# Patient Record
Sex: Male | Born: 1965 | Race: White | Hispanic: No | Marital: Single | Smoking: Never smoker
Health system: Southern US, Community
[De-identification: ages and names within clinical notes are randomized; demographics above are authoritative.]

## PROBLEM LIST (undated history)

## (undated) DIAGNOSIS — E785 Hyperlipidemia, unspecified: Secondary | ICD-10-CM

## (undated) DIAGNOSIS — R55 Syncope and collapse: Secondary | ICD-10-CM

## (undated) DIAGNOSIS — R569 Unspecified convulsions: Secondary | ICD-10-CM

## (undated) DIAGNOSIS — M1990 Unspecified osteoarthritis, unspecified site: Secondary | ICD-10-CM

## (undated) DIAGNOSIS — R519 Headache, unspecified: Secondary | ICD-10-CM

## (undated) DIAGNOSIS — Q909 Down syndrome, unspecified: Secondary | ICD-10-CM

## (undated) DIAGNOSIS — D509 Iron deficiency anemia, unspecified: Secondary | ICD-10-CM

## (undated) HISTORY — DX: Unspecified osteoarthritis, unspecified site: M19.90

## (undated) HISTORY — DX: Syncope and collapse: R55

## (undated) HISTORY — DX: Headache, unspecified: R51.9

## (undated) HISTORY — DX: Hyperlipidemia, unspecified: E78.5

## (undated) HISTORY — DX: Iron deficiency anemia, unspecified: D50.9

## (undated) NOTE — *Deleted (*Deleted)
Chaplain engaged in initial visit with Tracy Todd and his sister Tracy Todd.  Tracy Todd explained [name]'s current condition and health journey to chaplain.  Tracy Todd has seen some significant changes in [place] and as a family they have endured a lot of transition and grief over the course of two years.  They lost their j

---

## 2011-10-25 DIAGNOSIS — E785 Hyperlipidemia, unspecified: Secondary | ICD-10-CM | POA: Insufficient documentation

## 2017-04-04 ENCOUNTER — Other Ambulatory Visit: Payer: Self-pay | Admitting: Gastroenterology

## 2017-04-04 DIAGNOSIS — R634 Abnormal weight loss: Secondary | ICD-10-CM

## 2017-04-10 ENCOUNTER — Ambulatory Visit
Admission: RE | Admit: 2017-04-10 | Discharge: 2017-04-10 | Disposition: A | Discharge status: Home / Self Care | Payer: Medicaid Other | Source: Ambulatory Visit | Attending: Gastroenterology | Admitting: Gastroenterology

## 2017-04-10 DIAGNOSIS — R634 Abnormal weight loss: Secondary | ICD-10-CM

## 2019-06-10 ENCOUNTER — Other Ambulatory Visit: Payer: Self-pay

## 2019-06-10 DIAGNOSIS — Z20822 Contact with and (suspected) exposure to covid-19: Secondary | ICD-10-CM

## 2019-06-10 NOTE — Addendum Note (Signed)
Addended by: Jonelle Sidle E on: 06/10/2019 01:21 PM   Modules accepted: Orders

## 2019-09-16 ENCOUNTER — Observation Stay (HOSPITAL_COMMUNITY)
Admission: EM | Admit: 2019-09-16 | Discharge: 2019-09-17 | Disposition: A | Discharge status: Home / Self Care | Payer: Medicare Other | Attending: Student in an Organized Health Care Education/Training Program | Admitting: Student in an Organized Health Care Education/Training Program

## 2019-09-16 ENCOUNTER — Emergency Department (HOSPITAL_COMMUNITY): Payer: Medicare Other

## 2019-09-16 ENCOUNTER — Other Ambulatory Visit: Payer: Self-pay | Admitting: Cardiology

## 2019-09-16 ENCOUNTER — Observation Stay (HOSPITAL_COMMUNITY): Payer: Medicare Other

## 2019-09-16 ENCOUNTER — Other Ambulatory Visit: Payer: Self-pay

## 2019-09-16 DIAGNOSIS — M25569 Pain in unspecified knee: Secondary | ICD-10-CM | POA: Diagnosis not present

## 2019-09-16 DIAGNOSIS — D509 Iron deficiency anemia, unspecified: Secondary | ICD-10-CM | POA: Diagnosis not present

## 2019-09-16 DIAGNOSIS — I517 Cardiomegaly: Secondary | ICD-10-CM | POA: Diagnosis not present

## 2019-09-16 DIAGNOSIS — R9431 Abnormal electrocardiogram [ECG] [EKG]: Secondary | ICD-10-CM | POA: Diagnosis not present

## 2019-09-16 DIAGNOSIS — R7989 Other specified abnormal findings of blood chemistry: Secondary | ICD-10-CM | POA: Diagnosis not present

## 2019-09-16 DIAGNOSIS — E538 Deficiency of other specified B group vitamins: Secondary | ICD-10-CM | POA: Insufficient documentation

## 2019-09-16 DIAGNOSIS — Q909 Down syndrome, unspecified: Secondary | ICD-10-CM

## 2019-09-16 DIAGNOSIS — Z791 Long term (current) use of non-steroidal anti-inflammatories (NSAID): Secondary | ICD-10-CM | POA: Diagnosis not present

## 2019-09-16 DIAGNOSIS — R001 Bradycardia, unspecified: Secondary | ICD-10-CM | POA: Diagnosis not present

## 2019-09-16 DIAGNOSIS — Z7982 Long term (current) use of aspirin: Secondary | ICD-10-CM | POA: Insufficient documentation

## 2019-09-16 DIAGNOSIS — Z880 Allergy status to penicillin: Secondary | ICD-10-CM | POA: Diagnosis not present

## 2019-09-16 DIAGNOSIS — Z79899 Other long term (current) drug therapy: Secondary | ICD-10-CM | POA: Diagnosis not present

## 2019-09-16 DIAGNOSIS — R778 Other specified abnormalities of plasma proteins: Secondary | ICD-10-CM | POA: Insufficient documentation

## 2019-09-16 DIAGNOSIS — Z7901 Long term (current) use of anticoagulants: Secondary | ICD-10-CM | POA: Insufficient documentation

## 2019-09-16 DIAGNOSIS — R55 Syncope and collapse: Principal | ICD-10-CM | POA: Diagnosis present

## 2019-09-16 DIAGNOSIS — E785 Hyperlipidemia, unspecified: Secondary | ICD-10-CM | POA: Insufficient documentation

## 2019-09-16 DIAGNOSIS — R0683 Snoring: Secondary | ICD-10-CM | POA: Insufficient documentation

## 2019-09-16 DIAGNOSIS — M1710 Unilateral primary osteoarthritis, unspecified knee: Secondary | ICD-10-CM | POA: Diagnosis not present

## 2019-09-16 DIAGNOSIS — K219 Gastro-esophageal reflux disease without esophagitis: Secondary | ICD-10-CM | POA: Diagnosis not present

## 2019-09-16 DIAGNOSIS — R159 Full incontinence of feces: Secondary | ICD-10-CM | POA: Insufficient documentation

## 2019-09-16 DIAGNOSIS — I4891 Unspecified atrial fibrillation: Secondary | ICD-10-CM | POA: Diagnosis not present

## 2019-09-16 DIAGNOSIS — Z20822 Contact with and (suspected) exposure to covid-19: Secondary | ICD-10-CM | POA: Insufficient documentation

## 2019-09-16 HISTORY — DX: Down syndrome, unspecified: Q90.9

## 2019-09-16 LAB — CBG MONITORING, ED: Glucose-Capillary: 84 mg/dL (ref 70–99)

## 2019-09-16 LAB — CBC WITH DIFFERENTIAL/PLATELET
Abs Immature Granulocytes: 0.24 10*3/uL — ABNORMAL HIGH (ref 0.00–0.07)
Basophils Absolute: 0.1 10*3/uL (ref 0.0–0.1)
Basophils Relative: 1 %
Eosinophils Absolute: 0 10*3/uL (ref 0.0–0.5)
Eosinophils Relative: 1 %
HCT: 34.8 % — ABNORMAL LOW (ref 39.0–52.0)
Hemoglobin: 10.3 g/dL — ABNORMAL LOW (ref 13.0–17.0)
Immature Granulocytes: 4 %
Lymphocytes Relative: 16 %
Lymphs Abs: 0.9 10*3/uL (ref 0.7–4.0)
MCH: 28.5 pg (ref 26.0–34.0)
MCHC: 29.6 g/dL — ABNORMAL LOW (ref 30.0–36.0)
MCV: 96.4 fL (ref 80.0–100.0)
Monocytes Absolute: 0.3 10*3/uL (ref 0.1–1.0)
Monocytes Relative: 5 %
Neutro Abs: 4.1 10*3/uL (ref 1.7–7.7)
Neutrophils Relative %: 73 %
Platelets: 265 10*3/uL (ref 150–400)
RBC: 3.61 MIL/uL — ABNORMAL LOW (ref 4.22–5.81)
RDW: 14.6 % (ref 11.5–15.5)
WBC: 5.6 10*3/uL (ref 4.0–10.5)
nRBC: 0 % (ref 0.0–0.2)

## 2019-09-16 LAB — TROPONIN I (HIGH SENSITIVITY)
Troponin I (High Sensitivity): 19 ng/L — ABNORMAL HIGH (ref <18)
Troponin I (High Sensitivity): 101 ng/L (ref <18)
Troponin I (High Sensitivity): 154 ng/L (ref <18)
Troponin I (High Sensitivity): 107 ng/L (ref <18)

## 2019-09-16 LAB — COMPREHENSIVE METABOLIC PANEL
ALT: 18 U/L (ref 0–44)
AST: 25 U/L (ref 15–41)
Albumin: 3.3 g/dL — ABNORMAL LOW (ref 3.5–5.0)
Alkaline Phosphatase: 52 U/L (ref 38–126)
Anion gap: 10 (ref 5–15)
BUN: 22 mg/dL — ABNORMAL HIGH (ref 6–20)
CO2: 25 mmol/L (ref 22–32)
Calcium: 8.6 mg/dL — ABNORMAL LOW (ref 8.9–10.3)
Chloride: 106 mmol/L (ref 98–111)
Creatinine, Ser: 1.29 mg/dL — ABNORMAL HIGH (ref 0.61–1.24)
GFR calc Af Amer: >60 mL/min (ref >60)
GFR calc non Af Amer: >60 mL/min (ref >60)
Glucose, Bld: 108 mg/dL — ABNORMAL HIGH (ref 70–99)
Potassium: 4.2 mmol/L (ref 3.5–5.1)
Sodium: 141 mmol/L (ref 135–145)
Total Bilirubin: 0.5 mg/dL (ref 0.3–1.2)
Total Protein: 6.2 g/dL — ABNORMAL LOW (ref 6.5–8.1)

## 2019-09-16 LAB — RESPIRATORY PANEL BY RT PCR (FLU A&B, COVID)
Influenza A by PCR: NEGATIVE
Influenza B by PCR: NEGATIVE
SARS Coronavirus 2 by RT PCR: NEGATIVE

## 2019-09-16 LAB — FERRITIN: Ferritin: 6 ng/mL — ABNORMAL LOW (ref 24–336)

## 2019-09-16 LAB — VITAMIN B12: Vitamin B-12: 140 pg/mL — ABNORMAL LOW (ref 180–914)

## 2019-09-16 LAB — SAVE SMEAR(SSMR), FOR PROVIDER SLIDE REVIEW

## 2019-09-16 LAB — TSH: TSH: 1.941 u[IU]/mL (ref 0.350–4.500)

## 2019-09-16 LAB — D-DIMER, QUANTITATIVE: D-Dimer, Quant: 2.2 ug/mL-FEU — ABNORMAL HIGH (ref 0.00–0.50)

## 2019-09-16 LAB — HIV ANTIBODY (ROUTINE TESTING W REFLEX): HIV Screen 4th Generation wRfx: NONREACTIVE

## 2019-09-16 LAB — MAGNESIUM: Magnesium: 1.9 mg/dL (ref 1.7–2.4)

## 2019-09-16 LAB — TECHNOLOGIST SMEAR REVIEW

## 2019-09-16 MED ORDER — ENOXAPARIN SODIUM 40 MG/0.4ML ~~LOC~~ SOLN
40.0000 mg | SUBCUTANEOUS | Status: DC
  Administered 2019-09-16: 40 mg via SUBCUTANEOUS
  Filled 2019-09-16: qty 0.4

## 2019-09-16 MED ORDER — SODIUM CHLORIDE 0.9% FLUSH: 3.0000 mL | Freq: Two times a day (BID) | INTRAVENOUS | Status: DC

## 2019-09-16 MED ORDER — SODIUM CHLORIDE 0.9 % IV BOLUS
1000.0000 mL | Freq: Once | INTRAVENOUS | Status: AC
  Administered 2019-09-16: 13:00:00 1000 mL via INTRAVENOUS

## 2019-09-16 MED ORDER — ASPIRIN 81 MG PO CHEW
324.0000 mg | CHEWABLE_TABLET | Freq: Once | ORAL | Status: AC
  Administered 2019-09-16: 324 mg via ORAL
  Filled 2019-09-16: qty 4

## 2019-09-16 MED ORDER — ADULT MULTIVITAMIN W/MINERALS CH
1.0000 | ORAL_TABLET | Freq: Every day | ORAL | Status: DC
  Administered 2019-09-17: 1 via ORAL
  Filled 2019-09-16: qty 1

## 2019-09-16 MED ORDER — LORAZEPAM 2 MG/ML IJ SOLN
2.0000 mg | Freq: Once | INTRAMUSCULAR | Status: AC
  Administered 2019-09-16: 2 mg via INTRAVENOUS

## 2019-09-16 MED ORDER — SODIUM CHLORIDE 0.9 % IV BOLUS
1000.0000 mL | Freq: Once | INTRAVENOUS | Status: AC
  Administered 2019-09-16: 1000 mL via INTRAVENOUS

## 2019-09-16 MED ORDER — SUCRALFATE 1 G PO TABS
1.0000 g | ORAL_TABLET | Freq: Two times a day (BID) | ORAL | Status: DC
  Administered 2019-09-16 – 2019-09-17 (×2): 1 g via ORAL
  Filled 2019-09-16 (×2): qty 1

## 2019-09-16 MED ORDER — LORAZEPAM 2 MG/ML IJ SOLN
2.0000 mg | Freq: Once | INTRAMUSCULAR | Status: AC | PRN
  Administered 2019-09-16: 1 mg via INTRAVENOUS
  Filled 2019-09-16: qty 1

## 2019-09-16 MED ORDER — FIBER ADULT GUMMIES 2 G PO CHEW: CHEWABLE_TABLET | Freq: Every day | ORAL | Status: DC

## 2019-09-16 MED ORDER — LACTATED RINGERS IV SOLN: INTRAVENOUS | Status: DC

## 2019-09-16 MED ORDER — PRAVASTATIN SODIUM 40 MG PO TABS
40.0000 mg | ORAL_TABLET | Freq: Every day | ORAL | Status: DC
  Administered 2019-09-17: 40 mg via ORAL
  Filled 2019-09-16: qty 1

## 2019-09-16 MED ORDER — IOHEXOL 350 MG/ML SOLN
100.0000 mL | Freq: Once | INTRAVENOUS | Status: AC | PRN
  Administered 2019-09-16: 100 mL via INTRAVENOUS

## 2019-09-16 MED ORDER — DICLOFENAC SODIUM 75 MG PO TBEC: 75.0000 mg | DELAYED_RELEASE_TABLET | Freq: Two times a day (BID) | ORAL | Status: DC

## 2019-09-16 MED ORDER — LOPERAMIDE HCL 2 MG PO CAPS
2.0000 mg | ORAL_CAPSULE | Freq: Two times a day (BID) | ORAL | Status: DC
  Administered 2019-09-16 – 2019-09-17 (×2): 2 mg via ORAL
  Filled 2019-09-16 (×2): qty 1

## 2019-09-16 MED ORDER — LORAZEPAM 2 MG/ML IJ SOLN
INTRAMUSCULAR | Status: AC
  Filled 2019-09-16: qty 1

## 2019-09-16 NOTE — Progress Notes (Signed)
Patient admitted to [facility] room 10 with near syncope. Patient is alert to self, has developmental delay. Father is at beside. No C/o pain or respiratory distress at this time.patient is in bed locked at lower level. Will continue to monitor patient.

## 2019-09-16 NOTE — ED Notes (Signed)
[phone]) left for today. Want RN to have number to call for updates.

## 2019-09-16 NOTE — H&P (Signed)
Date: 09/16/2019               Patient Name:  Tracy Todd MRN: [id number]  DOB: April 11, 1966 Age / Sex: 54 y.o., male   PCP: Chesley Noon, MD         Medical Service: Internal Medicine Teaching Service         Attending Physician: Dr. Evette Doffing, Mallie Mussel, *    First Contact: Dr. Ronnald Ramp Pager: [phone]  Second Contact: Dr. Koleen Distance Pager: [phone]       After Hours (After 5p/  First Contact Pager: [phone]  weekends / holidays): Second Contact Pager: [phone]   Chief Complaint: Syncopal event   History of Present Illness: Tracy Todd is a 54 year-old male with a medical history of Down's syndrome, arthritis, GERD, and hyperlipidemia, who presents to the ED for evaluation of a syncopal event. Patient unable to provide history at this time as he is minimally verbal. Group home manager present at bedside and able to provide some history. This manager was not present for the event, but received report on it before presentation to the hospital.  Patient was in his usual state of health until this morning when he got up from the toilet and passed out. He briefly unconscious, but began to stir and open his eyes approximately 30 seconds to 1 minute later. For an additional 10 minutes, staff noticed he was breathing heavily and snoring initially before his breathing then normalized. Staff reports that the pt does not communicate when he is in pain or experiences symptoms. They say he is a healthy adult and only gets medical attention once a year at his physicals.  He has never had a syncopal episode in the past. No history of cardiac problems or cardiac surgeries. He has been isolated since March with no exposures or sick contacts. No history of seizures or recent changes in medications. He has been having appropriate oral intake. Has a history of fecal incontinence but takes loperamide which helps with symptoms and only has 2-3 BMs per day.   In the ED, pt was afebrile, bradycardic (HR in  the 50's), with initial BP 103/48. Initial troponin trend was 19 >> 100 and cardiology was consulted. Dr. Marlou Porch evaluated the pt and felt his clinical picture and lab work were indicative of hypotension during an orthostatic episode. He recommended outpatient echo and cardiology follow-up. After 3rd troponin in the ED trended up to 150, IMTS was called for admission for echo and observation.  Meds:  Current Meds  Medication Sig  . diclofenac (VOLTAREN) 75 MG EC tablet Take 75 mg by mouth 2 (two) times daily.  Marland Kitchen FIBER ADULT GUMMIES PO Take 2 tablets by mouth daily.  Marland Kitchen ketoconazole (NIZORAL) 2 % shampoo Apply 1 application topically 2 (two) times a week.  . loperamide (IMODIUM) 2 MG capsule Take 2 mg by mouth 2 (two) times daily.  . miconazole (MICATIN) 2 % cream Apply 1 application topically 2 (two) times daily. Apply to feet  . Multiple Vitamins-Minerals (MULTIVITAMIN WITH MINERALS) tablet Take 1 tablet by mouth daily.  Marland Kitchen nystatin cream (MYCOSTATIN) Apply 1 application topically 3 (three) times daily as needed for dry skin.  . pravastatin (PRAVACHOL) 40 MG tablet Take 40 mg by mouth daily.  . Selenium Sulfide 2.3 % SHAM Apply 1 application topically See admin instructions. Wash hair and face, alternate weekly with Nizoral shampoo  . sucralfate (CARAFATE) 1 g tablet Take 1 g by mouth 2 (two) times  daily.   Allergies: Allergies as of 09/16/2019 - Review Complete 09/16/2019  Allergen Reaction Noted  . Penicillins  10/21/2010   No past medical history on file.  Family History:  No family history on file. Unable to obtain given pt's baseline functional status.   Social History:  Pt lives at a [facility], a group home for adult males with intellectual and developmental disabilities. Per report from group home, pt is pleasant and personable though does not converse much. Social History   Tobacco Use  . Smoking status: Not on file  Substance Use Topics  . Alcohol use: Not on file  . Drug use:  Not on file   Review of Systems: Unable to obtain given pt's baseline functional status.   Physical Exam: Blood pressure (!) 130/49, pulse (!) 54, temperature 98 F (36.7 C), temperature source Oral, resp. rate 16, SpO2 93 %. Physical Exam Vitals and nursing note reviewed.  Constitutional:      General: He is not in acute distress.    Appearance: He is not ill-appearing.  HENT:     Head: Normocephalic and atraumatic.     Comments: Phenotypically Down's faces. Eyes:     Extraocular Movements: Extraocular movements intact.  Cardiovascular:     Rate and Rhythm: Normal rate and regular rhythm.     Heart sounds: Normal heart sounds.  Pulmonary:     Effort: Pulmonary effort is normal. No respiratory distress.     Breath sounds: Normal breath sounds.  Abdominal:     General: Abdomen is flat. Bowel sounds are normal.     Palpations: Abdomen is soft.  Musculoskeletal:     Comments: Discoloration over knees bilaterally. Without swelling or deformity.  Skin:    General: Skin is warm and dry.  Neurological:     General: No focal deficit present.     Mental Status: He is alert.     Comments: Moving all extremities spontaneously   Psychiatric:     Comments: Pleasant affect    EKG: personally reviewed my interpretation is normal rate, sinus rhythm, and biphasic T waves in V2-V5.  CXR: personally reviewed my interpretation is no cardiomegaly, no pulmonary infiltrates, effusion, or bony abnormalities.  Assessment & Plan by Problem: Active Problems:   Near syncope  Tracy Todd is a 54 year-old male with a medical history of Down's syndrome, arthritis, GERD, and hyperlipidemia, who presents to the ED for evaluation of a syncopal event.   Near syncope Pt presented after episode of pre-syncope after using the restroom at his group home. Per report, it sounds like pt was alert after event but non-verbal (which is not off his baseline). Pt could not endorse dyspnea, chest pain, or  prodromal symptoms. No head trauma reported, and imaging negative. No history of heart surgeries or VSDs, no murmur on exam.  In the ED, he had soft blood pressures with an initial MAP 63, but sequent MAPs after fluids were greater than 77. Orthostatic vital signs were negative. D-dimer elevated to 2.20, though CTA negative for PE. History not consistent with seizure, given no post ictal period or observed myoclonic jerks. Trop trend 19 >> 100 >> 150. EKG remarkable for biphasic T waves in leads V2-V5 which were stable on repeat. Cardiology consulted in the ED, felt pt's presentation consistent with orthostatic hypotension in the setting of dehydration - outpt follow-up scheduled for 09/30/2019.  - presentation most consistent with vasovagal vs orthostatic syncope - will observe overnight  - echo - repeat evening troponin -  repeat EKGs every 6hrs overnight to assess for ischemic changes - continue maintenance fluids - follow-up AM BMP  Anemia Hgb on admission 10.3. Chronicity uncertain given last Hgb in chart was 13.8 in 2018. Acute drop could account for his pre-syncopal episode. No documented melena or hematochezia. No prior colonoscopy documented in the chart. Group home manager can't recall pt have any endoscopic procedures, as he does not believe the pt could tolerate that.  - FOBT - ferritin, B12, and path smear - continue to monitor daily CBCs  Hyperlipidemia - continue home pravastatin 40mg  daily  GERD - continue home sucralfate 1g PO BID  Diet - regular Fluids - LR 167mL/hr DVT ppx - enoxaparin 40mg  subQ daily CODE STATUS - FULL CODE  Dispo: Admit patient to Observation with expected length of stay less than 2 midnights.  Signed: 80m, MD 09/16/2019, 6:00 PM  Pager: [phone]

## 2019-09-16 NOTE — ED Triage Notes (Signed)
Pt arrives by EMS from group home with c/o witnessed syncope. Pt reportedly was in the restroom, stood up and had a witnessed syncopal episode. Pt unresponsive for about 10 mis. Pt has down syndrome at baseline. Pt during triage refusing treatment

## 2019-09-16 NOTE — ED Provider Notes (Signed)
[facility] Provider Note   CSN: [id number] Arrival date & time: 09/16/19  [id number]  LEVEL 5 CAVEAT - NONVERBAL History Chief Complaint  Patient presents with  . Loss of Consciousness    Tracy Todd is a 54 y.o. male.  HPI 54 year old male with a history of Down syndrome presents with syncope.  History is very limited.  The patient cannot provide any history.  EMS reports they were called out to the group home for syncope.  Patient reportedly stood up from going to the bathroom and was pulling up his pants and passed out falling forward.  EMS reports that he was unconscious when they arrived and slowly improved over about 10 minutes.  They saw some dried blood in his mouth but could not get a good look.  No obvious seizure like activity. Otherwise, history is very limited.  No past medical history on file.  There are no problems to display for this patient.        No family history on file.  Social History   Tobacco Use  . Smoking status: Not on file  Substance Use Topics  . Alcohol use: Not on file  . Drug use: Not on file    Home Medications Prior to Admission medications   Medication Sig Start Date End Date Taking? Authorizing Provider  diclofenac (VOLTAREN) 75 MG EC tablet Take 75 mg by mouth 2 (two) times daily. 10/25/11  Yes [provider]  FIBER ADULT GUMMIES PO Take 2 tablets by mouth daily.   Yes [provider]  ketoconazole (NIZORAL) 2 % shampoo Apply 1 application topically 2 (two) times a week. 08/19/19  Yes [provider]  loperamide (IMODIUM) 2 MG capsule Take 2 mg by mouth 2 (two) times daily.   Yes [provider]  miconazole (MICATIN) 2 % cream Apply 1 application topically 2 (two) times daily. Apply to feet   Yes [provider]  Multiple Vitamins-Minerals (MULTIVITAMIN WITH MINERALS) tablet Take 1 tablet by mouth daily.   Yes [provider]  nystatin cream  (MYCOSTATIN) Apply 1 application topically 3 (three) times daily as needed for dry skin. 01/21/19  Yes [provider]  pravastatin (PRAVACHOL) 40 MG tablet Take 40 mg by mouth daily. 10/25/11  Yes [provider]  Selenium Sulfide 2.3 % SHAM Apply 1 application topically See admin instructions. Wash hair and face, alternate weekly with Nizoral shampoo 12/09/11  Yes [provider]  sucralfate (CARAFATE) 1 g tablet Take 1 g by mouth 2 (two) times daily. 01/03/19  Yes [provider]    Allergies    Penicillins  Review of Systems   Review of Systems  Unable to perform ROS: Mental status change    Physical Exam Updated Vital Signs BP 103/89   Pulse (!) 56 Comment: Room air  Temp 98.2 F (36.8 C) (Rectal)   Resp 15   SpO2 98% Comment: Room air  Physical Exam Vitals and nursing note reviewed.  Constitutional:      Appearance: He is well-developed.     Interventions: Cervical collar in place.  HENT:     Head: Normocephalic and atraumatic.     Right Ear: External ear normal.     Left Ear: External ear normal.     Nose: Nose normal.  Eyes:     General:        Right eye: No discharge.        Left eye: No discharge.  Pupils: Pupils are equal, round, and reactive to light.  Cardiovascular:     Rate and Rhythm: Normal rate and regular rhythm.     Heart sounds: Normal heart sounds. No murmur.  Pulmonary:     Effort: Pulmonary effort is normal.     Breath sounds: Normal breath sounds.  Abdominal:     Palpations: Abdomen is soft.     Tenderness: There is no abdominal tenderness.  Musculoskeletal:     Cervical back: Neck supple.  Skin:    General: Skin is warm and dry.  Neurological:     Mental Status: He is alert.     Comments: Awake, alert, does not speak. Moves all 4 extremities without obvious deficits. Does not really participate in the exam.  Psychiatric:        Mood and Affect: Mood is not anxious.     ED Results / Procedures /  Treatments   Labs (all labs ordered are listed, but only abnormal results are displayed) Labs Reviewed  CBC WITH DIFFERENTIAL/PLATELET - Abnormal; Notable for the following components:      Result Value   RBC 3.61 (*)    Hemoglobin 10.3 (*)    HCT 34.8 (*)    MCHC 29.6 (*)    Abs Immature Granulocytes 0.24 (*)    All other components within normal limits  COMPREHENSIVE METABOLIC PANEL - Abnormal; Notable for the following components:   Glucose, Bld 108 (*)    BUN 22 (*)    Creatinine, Ser 1.29 (*)    Calcium 8.6 (*)    Total Protein 6.2 (*)    Albumin 3.3 (*)    All other components within normal limits  D-DIMER, QUANTITATIVE (NOT AT [facility]) - Abnormal; Notable for the following components:   D-Dimer, Quant 2.20 (*)    All other components within normal limits  TROPONIN I (HIGH SENSITIVITY) - Abnormal; Notable for the following components:   Troponin I (High Sensitivity) 19 (*)    All other components within normal limits  TROPONIN I (HIGH SENSITIVITY) - Abnormal; Notable for the following components:   Troponin I (High Sensitivity) 101 (*)    All other components within normal limits  TROPONIN I (HIGH SENSITIVITY) - Abnormal; Notable for the following components:   Troponin I (High Sensitivity) 154 (*)    All other components within normal limits  RESPIRATORY PANEL BY RT PCR (FLU A&B, COVID)  MAGNESIUM  URINALYSIS, ROUTINE W REFLEX MICROSCOPIC  RAPID URINE DRUG SCREEN, HOSP PERFORMED  CBG MONITORING, ED    EKG EKG Interpretation  Date/Time:  Monday September 16 2019 14:18:54 EST Ventricular Rate:  52 PR Interval:    QRS Duration: 99 QT Interval:  420 QTC Calculation: 391 R Axis:   81 Text Interpretation: Sinus rhythm Borderline short PR interval Abnormal R-wave progression, early transition Borderline T abnormalities, inferior leads no significant change since earlier in the day Confirmed by [name] [phone]) on 09/16/2019 2:37:17 PM   Radiology CT HEAD WO  CONTRAST  Result Date: 09/16/2019 CLINICAL DATA:  Syncopal episode. Patient was unresponsive for 10 minutes after syncopal episode. Syncope upon standing up after using the restroom. Initial encounter. EXAM: CT HEAD WITHOUT CONTRAST CT CERVICAL SPINE WITHOUT CONTRAST TECHNIQUE: Multidetector CT imaging of the head and cervical spine was performed following the standard protocol without intravenous contrast. Multiplanar CT image reconstructions of the cervical spine were also generated. COMPARISON:  None. FINDINGS: CT HEAD FINDINGS Brain: Mild generalized atrophy is present. Ventricular enlargement is proportionate to  the degree of atrophy. The aqueduct is patent. No acute infarct, hemorrhage, or mass lesion is present. No significant extraaxial fluid collection is present. The brainstem and cerebellum are within normal limits. Vascular: Atherosclerotic calcifications are present within the cavernous internal carotid arteries bilaterally. Hyperdense vessel is present. Skull: Calvarium is intact. No focal lytic or blastic lesions are present. No significant extracranial soft tissue lesion is present. Sinuses/Orbits: The paranasal sinuses and mastoid air cells are clear. The globes and orbits are within normal limits. CT CERVICAL SPINE FINDINGS Alignment: Mild subluxation at C1-2 is consistent with down syndrome. The plan toe dens interval is 4.5 mm. Skull base and vertebrae: Craniocervical junction is otherwise within normal limits. Vertebral body heights maintained. No acute or healing fractures are present. Soft tissues and spinal canal: No prevertebral fluid or swelling. No visible canal hematoma. Disc levels: Advanced endplate degenerative changes present from C2-3 through C6-7. Osseous foraminal narrowing is greatest at C3-4 on the right C4-5 on the left. Upper chest: Lung apices are clear. Thoracic inlet is within normal limits. IMPRESSION: 1. No acute trauma to the head or cervical spine. 2. Mild subluxation  at C1-2 is consistent with down syndrome. 3. Multilevel degenerative changes of the cervical spine without acute or healing fracture. Electronically Signed   By: Marin Roberts M.D.   On: 09/16/2019 08:53   CT Angio Chest PE W and/or Wo Contrast  Result Date: 09/16/2019 CLINICAL DATA:  Syncopal episode today. Elevated D-dimer. EXAM: CT ANGIOGRAPHY CHEST WITH CONTRAST TECHNIQUE: Multidetector CT imaging of the chest was performed using the standard protocol during bolus administration of intravenous contrast. Multiplanar CT image reconstructions and MIPs were obtained to evaluate the vascular anatomy. CONTRAST:  OMNIPAQUE IOHEXOL 350 MG/ML SOLN COMPARISON:  Chest x-ray dated 09/16/2019 FINDINGS: Cardiovascular: Satisfactory opacification of the pulmonary arteries to the segmental level. No evidence of pulmonary embolism. Normal heart size. No pericardial effusion. Mediastinum/Nodes: No enlarged mediastinal, hilar, or axillary lymph nodes. Thyroid gland, trachea, and esophagus demonstrate no significant findings. Lungs/Pleura: Lungs are clear. No pleural effusion or pneumothorax. Upper Abdomen: No acute abnormality. Musculoskeletal: No chest wall abnormality. No acute or significant osseous findings. Review of the MIP images confirms the above findings. IMPRESSION: Normal exam. Electronically Signed   By: Francene Boyers M.D.   On: 09/16/2019 13:31   CT CERVICAL SPINE WO CONTRAST  Result Date: 09/16/2019 CLINICAL DATA:  Syncopal episode. Patient was unresponsive for 10 minutes after syncopal episode. Syncope upon standing up after using the restroom. Initial encounter. EXAM: CT HEAD WITHOUT CONTRAST CT CERVICAL SPINE WITHOUT CONTRAST TECHNIQUE: Multidetector CT imaging of the head and cervical spine was performed following the standard protocol without intravenous contrast. Multiplanar CT image reconstructions of the cervical spine were also generated. COMPARISON:  None. FINDINGS: CT HEAD FINDINGS  Brain: Mild generalized atrophy is present. Ventricular enlargement is proportionate to the degree of atrophy. The aqueduct is patent. No acute infarct, hemorrhage, or mass lesion is present. No significant extraaxial fluid collection is present. The brainstem and cerebellum are within normal limits. Vascular: Atherosclerotic calcifications are present within the cavernous internal carotid arteries bilaterally. Hyperdense vessel is present. Skull: Calvarium is intact. No focal lytic or blastic lesions are present. No significant extracranial soft tissue lesion is present. Sinuses/Orbits: The paranasal sinuses and mastoid air cells are clear. The globes and orbits are within normal limits. CT CERVICAL SPINE FINDINGS Alignment: Mild subluxation at C1-2 is consistent with down syndrome. The plan toe dens interval is 4.5 mm. Skull base  and vertebrae: Craniocervical junction is otherwise within normal limits. Vertebral body heights maintained. No acute or healing fractures are present. Soft tissues and spinal canal: No prevertebral fluid or swelling. No visible canal hematoma. Disc levels: Advanced endplate degenerative changes present from C2-3 through C6-7. Osseous foraminal narrowing is greatest at C3-4 on the right C4-5 on the left. Upper chest: Lung apices are clear. Thoracic inlet is within normal limits. IMPRESSION: 1. No acute trauma to the head or cervical spine. 2. Mild subluxation at C1-2 is consistent with down syndrome. 3. Multilevel degenerative changes of the cervical spine without acute or healing fracture. Electronically Signed   By: San Morelle M.D.   On: 09/16/2019 08:53   DG Chest Portable 1 View  Result Date: 09/16/2019 CLINICAL DATA:  Syncopal episode EXAM: PORTABLE CHEST 1 VIEW COMPARISON:  Noted FINDINGS: The heart size and mediastinal contours are within normal limits. Both lungs are clear. No pleural effusion. The visualized skeletal structures are unremarkable. IMPRESSION: No acute  process in the chest Electronically Signed   By: Macy Mis M.D.   On: 09/16/2019 10:25    Procedures .Critical Care Performed by: Sherwood Gambler, MD Authorized by: Sherwood Gambler, MD   Critical care provider statement:    Critical care time (minutes):  30   Critical care time was exclusive of:  Separately billable procedures and treating other patients   Critical care was necessary to treat or prevent imminent or life-threatening deterioration of the following conditions:  Cardiac failure and circulatory failure   Critical care was time spent personally by me on the following activities:  Discussions with consultants, evaluation of patient's response to treatment, examination of patient, ordering and performing treatments and interventions, ordering and review of laboratory studies, ordering and review of radiographic studies, pulse oximetry, re-evaluation of patient's condition, obtaining history from patient or surrogate and review of old charts   (including critical care time)  Medications Ordered in ED Medications  sodium chloride 0.9 % bolus 1,000 mL (0 mLs Intravenous Stopped 09/16/19 1112)  LORazepam (ATIVAN) injection 2 mg (2 mg Intravenous Given 09/16/19 0825)  sodium chloride 0.9 % bolus 1,000 mL (0 mLs Intravenous Stopped 09/16/19 1357)  aspirin chewable tablet 324 mg (324 mg Oral Given 09/16/19 1320)  LORazepam (ATIVAN) injection 2 mg (1 mg Intravenous Given 09/16/19 1330)  iohexol (OMNIPAQUE) 350 MG/ML injection 100 mL (100 mLs Intravenous Contrast Given 09/16/19 1325)    ED Course  I have reviewed the triage vital signs and the nursing notes.  Pertinent labs & imaging results that were available during my care of the patient were reviewed by me and considered in my medical decision making (see chart for details).    MDM Rules/Calculators/A&P                      Patient placed on cardiac monitoring to eval for arrythmia in setting of syncope. ECG with nonspecific T  waves diffusely. Had abnormal T waves on further review in 2008.   Patient continues to have no complaints here.  Has borderline soft blood pressures though no outright hypotension.  Work-up for PE which is negative.  Initial troponin is 19 and repeat is 100.  I have discussed with Dr. Marlou Porch, who feels this is all related to what was likely hypotension while being unconscious.  With shared decision making, father and I decided to get a third troponin to see if it is continuing to climb.  Given that it is even higher  at 150, I think the most reasonable course is to admit for observation and echo and further care/monitoring.  Dad agrees.  At this point cardiology recommends no heparin.  Given fairly moderate elevations this is probably not ischemic. Final Clinical Impression(s) / ED Diagnoses Final diagnoses:  Syncope and collapse  Elevated troponin    Rx / DC Orders ED Discharge Orders    None       [name], MD 09/16/19 1537

## 2019-09-16 NOTE — Consult Note (Signed)
Cardiology Consultation:   Patient ID: Tracy Todd MRN: [id number]; DOB: Jan 07, 1966  Admit date: 09/16/2019 Date of Consult: 09/16/2019  Primary Care Provider: Eartha Inch, MD Primary Cardiologist: No primary care provider on file. Adelle Zachar new Primary Electrophysiologist:  None    Patient Profile:   Tracy Todd is a 54 y.o. male with a hx of Downs syndrome who is being seen today for the evaluation of syncope at the request of Dr. Criss Alvine.  History of Present Illness:   Tracy Todd is a 54 year old male with Down syndrome here with his father, 59 year old patient of Dr. Lindaann Slough with A. fib, who earlier this morning at syncope.  Stood up from the toilet and passed out.  He was unresponsive for approximately 10 minutes.  His father states that "he has never been sick a day in his life ".  Never had any prior heart surgeries.  No prior arrhythmias.  He denies any chest discomfort or shortness of breath.  Appears fairly comfortable laying flat in bed.  Challenging to obtain a history although does not appear to be in any distress.  His father helps with feeling in the details.  He lives in a group home.  Telemetry here in the emergency department unremarkable.  Sinus rhythm.  Blood pressure has fluctuated here in the ER but at one point was 103/48.  Heart rate in the upper 40s to 50s.  His EKG prompted the call to cardiology given his somewhat biphasic T waves out in the anterior precordial leads.  This was repeated and has no significant change.  His creatinine was slightly elevated 1.29 and his BUN was also slightly elevated at 22.  Minor anemia 10.3 hemoglobin.  White count is normal.  Chest x-ray also unremarkable showing no evidence of cardiomegaly.  Personally reviewed.   Heart Pathway Score:     No past medical history on file.  No past medical history. No early family history of CAD.  His father in his 38s has atrial fibrillation, taken care of by Dr.  Delton See. Non-smoker nondrinker  Home Medications:  Prior to Admission medications   Not on File    Inpatient Medications: Scheduled Meds: . LORazepam       Continuous Infusions: . sodium chloride     PRN Meds:   Allergies:   Not on File  Social History:   Social History   Socioeconomic History  . Marital status: Single    Spouse name: Not on file  . Number of children: Not on file  . Years of education: Not on file  . Highest education level: Not on file  Occupational History  . Not on file  Tobacco Use  . Smoking status: Not on file  Substance and Sexual Activity  . Alcohol use: Not on file  . Drug use: Not on file  . Sexual activity: Not on file  Other Topics Concern  . Not on file  Social History Narrative  . Not on file   Social Determinants of Health   Financial Resource Strain:   . Difficulty of Paying Living Expenses: Not on file  Food Insecurity:   . Worried About Programme researcher, broadcasting/film/video in the Last Year: Not on file  . Ran Out of Food in the Last Year: Not on file  Transportation Needs:   . Lack of Transportation (Medical): Not on file  . Lack of Transportation (Non-Medical): Not on file  Physical Activity:   . Days of Exercise per Week: Not on file  .  Minutes of Exercise per Session: Not on file  Stress:   . Feeling of Stress : Not on file  Social Connections:   . Frequency of Communication with Friends and Family: Not on file  . Frequency of Social Gatherings with Friends and Family: Not on file  . Attends Religious Services: Not on file  . Active Member of Clubs or Organizations: Not on file  . Attends Banker Meetings: Not on file  . Marital Status: Not on file  Intimate Partner Violence:   . Fear of Current or Ex-Partner: Not on file  . Emotionally Abused: Not on file  . Physically Abused: Not on file  . Sexually Abused: Not on file    Family History:   No early family history of CAD  ROS:  Please see the history of  present illness.  Challenging to fully obtain, with the help of father no real inciting factors. All other ROS reviewed and negative.     Physical Exam/Data:   Vitals:   09/16/19 1030 09/16/19 1045 09/16/19 1100 09/16/19 1130  BP: 115/67 (!) 134/116 (!) 126/97 126/76  Pulse:      Resp:    16  Temp:      TempSrc:      SpO2:       No intake or output data in the 24 hours ending 09/16/19 1154 No flowsheet data found.   There is no height or weight on file to calculate BMI.  General:  Well nourished, well developed, in no acute distress, patient with Down's syndrome HEENT: normal Lymph: no adenopathy Neck: no JVD Endocrine:  No thryomegaly Vascular: No carotid bruits; FA pulses 2+ bilaterally without bruits  Cardiac:  normal S1, S2; RRR; no murmur  Lungs:  clear to auscultation bilaterally, no wheezing, rhonchi or rales  Abd: soft, nontender, no hepatomegaly  Ext: no edema Musculoskeletal:  No deformities, BUE and BLE strength normal and equal Skin: warm and dry  Neuro:  CNs 2-12 intact, no focal abnormalities noted Psych:  Normal affect   EKG:  The EKG was personally reviewed and demonstrates: As described above Telemetry:  Telemetry was personally reviewed and demonstrates: Sinus rhythm no pauses no ventricular arrhythmias  Relevant CV Studies: No prior  Laboratory Data:  High Sensitivity Troponin:   Recent Labs  Lab 09/16/19 0816  TROPONINIHS 19*     Chemistry Recent Labs  Lab 09/16/19 0816  NA 141  K 4.2  CL 106  CO2 25  GLUCOSE 108*  BUN 22*  CREATININE 1.29*  CALCIUM 8.6*  GFRNONAA >60  GFRAA >60  ANIONGAP 10    Recent Labs  Lab 09/16/19 0816  PROT 6.2*  ALBUMIN 3.3*  AST 25  ALT 18  ALKPHOS 52  BILITOT 0.5   Hematology Recent Labs  Lab 09/16/19 0816  WBC 5.6  RBC 3.61*  HGB 10.3*  HCT 34.8*  MCV 96.4  MCH 28.5  MCHC 29.6*  RDW 14.6  PLT 265   BNPNo results for input(s): BNP, PROBNP in the last 168 hours.  DDimer No results for  input(s): DDIMER in the last 168 hours.   Radiology/Studies:  CT HEAD WO CONTRAST  Result Date: 09/16/2019 CLINICAL DATA:  Syncopal episode. Patient was unresponsive for 10 minutes after syncopal episode. Syncope upon standing up after using the restroom. Initial encounter. EXAM: CT HEAD WITHOUT CONTRAST CT CERVICAL SPINE WITHOUT CONTRAST TECHNIQUE: Multidetector CT imaging of the head and cervical spine was performed following the standard protocol without intravenous  contrast. Multiplanar CT image reconstructions of the cervical spine were also generated. COMPARISON:  None. FINDINGS: CT HEAD FINDINGS Brain: Mild generalized atrophy is present. Ventricular enlargement is proportionate to the degree of atrophy. The aqueduct is patent. No acute infarct, hemorrhage, or mass lesion is present. No significant extraaxial fluid collection is present. The brainstem and cerebellum are within normal limits. Vascular: Atherosclerotic calcifications are present within the cavernous internal carotid arteries bilaterally. Hyperdense vessel is present. Skull: Calvarium is intact. No focal lytic or blastic lesions are present. No significant extracranial soft tissue lesion is present. Sinuses/Orbits: The paranasal sinuses and mastoid air cells are clear. The globes and orbits are within normal limits. CT CERVICAL SPINE FINDINGS Alignment: Mild subluxation at C1-2 is consistent with down syndrome. The plan toe dens interval is 4.5 mm. Skull base and vertebrae: Craniocervical junction is otherwise within normal limits. Vertebral body heights maintained. No acute or healing fractures are present. Soft tissues and spinal canal: No prevertebral fluid or swelling. No visible canal hematoma. Disc levels: Advanced endplate degenerative changes present from C2-3 through C6-7. Osseous foraminal narrowing is greatest at C3-4 on the right C4-5 on the left. Upper chest: Lung apices are clear. Thoracic inlet is within normal limits.  IMPRESSION: 1. No acute trauma to the head or cervical spine. 2. Mild subluxation at C1-2 is consistent with down syndrome. 3. Multilevel degenerative changes of the cervical spine without acute or healing fracture. Electronically Signed   By: San Morelle M.D.   On: 09/16/2019 08:53   CT CERVICAL SPINE WO CONTRAST  Result Date: 09/16/2019 CLINICAL DATA:  Syncopal episode. Patient was unresponsive for 10 minutes after syncopal episode. Syncope upon standing up after using the restroom. Initial encounter. EXAM: CT HEAD WITHOUT CONTRAST CT CERVICAL SPINE WITHOUT CONTRAST TECHNIQUE: Multidetector CT imaging of the head and cervical spine was performed following the standard protocol without intravenous contrast. Multiplanar CT image reconstructions of the cervical spine were also generated. COMPARISON:  None. FINDINGS: CT HEAD FINDINGS Brain: Mild generalized atrophy is present. Ventricular enlargement is proportionate to the degree of atrophy. The aqueduct is patent. No acute infarct, hemorrhage, or mass lesion is present. No significant extraaxial fluid collection is present. The brainstem and cerebellum are within normal limits. Vascular: Atherosclerotic calcifications are present within the cavernous internal carotid arteries bilaterally. Hyperdense vessel is present. Skull: Calvarium is intact. No focal lytic or blastic lesions are present. No significant extracranial soft tissue lesion is present. Sinuses/Orbits: The paranasal sinuses and mastoid air cells are clear. The globes and orbits are within normal limits. CT CERVICAL SPINE FINDINGS Alignment: Mild subluxation at C1-2 is consistent with down syndrome. The plan toe dens interval is 4.5 mm. Skull base and vertebrae: Craniocervical junction is otherwise within normal limits. Vertebral body heights maintained. No acute or healing fractures are present. Soft tissues and spinal canal: No prevertebral fluid or swelling. No visible canal hematoma.  Disc levels: Advanced endplate degenerative changes present from C2-3 through C6-7. Osseous foraminal narrowing is greatest at C3-4 on the right C4-5 on the left. Upper chest: Lung apices are clear. Thoracic inlet is within normal limits. IMPRESSION: 1. No acute trauma to the head or cervical spine. 2. Mild subluxation at C1-2 is consistent with down syndrome. 3. Multilevel degenerative changes of the cervical spine without acute or healing fracture. Electronically Signed   By: San Morelle M.D.   On: 09/16/2019 08:53   DG Chest Portable 1 View  Result Date: 09/16/2019 CLINICAL DATA:  Syncopal episode EXAM:  PORTABLE CHEST 1 VIEW COMPARISON:  Noted FINDINGS: The heart size and mediastinal contours are within normal limits. Both lungs are clear. No pleural effusion. The visualized skeletal structures are unremarkable. IMPRESSION: No acute process in the chest Electronically Signed   By: Guadlupe Spanish M.D.   On: 09/16/2019 10:25         Assessment and Plan:   Syncope -Given his mildly elevated creatinine, low normal blood pressure at times in the emergency room, getting up from the toilet, this was likely an incident of orthostatic hypotension in the setting of dehydration.  He is feeling better after IV fluid administration.  Blood pressure has improved.  Currently we are obtaining orthostatics. -We will go ahead and set him up as an outpatient to check an echocardiogram to ensure proper structure and function of his heart.  Certainly given his underlying Down's syndrome we would not proceed with any invasive therapies.  He did not have any prior heart surgeries as a child.  No prior history of VSDs for instance.  I do not hear any murmurs on exam.  His heart rate is bradycardic in the 50s, upper 40s at times while laying in bed, sinus rhythm and should not be the root cause for his syncopal episode. Telemetry here unremarkable. -I would continue with conservative management for now, encourage  electrolyte drinks such as Gatorade to maintain proper hydration.  Careful with getting up from seated or laying down position. -Troponin was negative.  Electrolytes unremarkable.  Okay from my perspective for discharge from the emergency department.  Of course if symptoms become more worrisome, he needs to seek medical attention.      For questions or updates, please contact [facility] Please consult [email or web address] for contact info under     Signed, Donato Schultz, MD  09/16/2019 11:54 AM

## 2019-09-16 NOTE — ED Notes (Signed)
Report attempted 

## 2019-09-17 ENCOUNTER — Encounter (HOSPITAL_COMMUNITY): Payer: Self-pay | Admitting: Student in an Organized Health Care Education/Training Program

## 2019-09-17 ENCOUNTER — Observation Stay (HOSPITAL_BASED_OUTPATIENT_CLINIC_OR_DEPARTMENT_OTHER): Payer: Medicare Other

## 2019-09-17 DIAGNOSIS — E538 Deficiency of other specified B group vitamins: Secondary | ICD-10-CM

## 2019-09-17 DIAGNOSIS — R55 Syncope and collapse: Secondary | ICD-10-CM

## 2019-09-17 DIAGNOSIS — D649 Anemia, unspecified: Secondary | ICD-10-CM

## 2019-09-17 DIAGNOSIS — R778 Other specified abnormalities of plasma proteins: Secondary | ICD-10-CM | POA: Diagnosis present

## 2019-09-17 DIAGNOSIS — K219 Gastro-esophageal reflux disease without esophagitis: Secondary | ICD-10-CM

## 2019-09-17 DIAGNOSIS — Q909 Down syndrome, unspecified: Secondary | ICD-10-CM | POA: Diagnosis not present

## 2019-09-17 DIAGNOSIS — D509 Iron deficiency anemia, unspecified: Secondary | ICD-10-CM

## 2019-09-17 DIAGNOSIS — E785 Hyperlipidemia, unspecified: Secondary | ICD-10-CM

## 2019-09-17 DIAGNOSIS — M1990 Unspecified osteoarthritis, unspecified site: Secondary | ICD-10-CM

## 2019-09-17 DIAGNOSIS — Z880 Allergy status to penicillin: Secondary | ICD-10-CM

## 2019-09-17 LAB — BASIC METABOLIC PANEL
Anion gap: 9 (ref 5–15)
BUN: 13 mg/dL (ref 6–20)
CO2: 25 mmol/L (ref 22–32)
Calcium: 8.3 mg/dL — ABNORMAL LOW (ref 8.9–10.3)
Chloride: 105 mmol/L (ref 98–111)
Creatinine, Ser: 1.02 mg/dL (ref 0.61–1.24)
GFR calc Af Amer: >60 mL/min (ref >60)
GFR calc non Af Amer: >60 mL/min (ref >60)
Glucose, Bld: 85 mg/dL (ref 70–99)
Potassium: 3.7 mmol/L (ref 3.5–5.1)
Sodium: 139 mmol/L (ref 135–145)

## 2019-09-17 LAB — CBC
HCT: 31 % — ABNORMAL LOW (ref 39.0–52.0)
Hemoglobin: 9.3 g/dL — ABNORMAL LOW (ref 13.0–17.0)
MCH: 28.3 pg (ref 26.0–34.0)
MCHC: 30 g/dL (ref 30.0–36.0)
MCV: 94.2 fL (ref 80.0–100.0)
Platelets: 222 10*3/uL (ref 150–400)
RBC: 3.29 MIL/uL — ABNORMAL LOW (ref 4.22–5.81)
RDW: 15 % (ref 11.5–15.5)
WBC: 6.7 10*3/uL (ref 4.0–10.5)
nRBC: 0 % (ref 0.0–0.2)

## 2019-09-17 LAB — ECHOCARDIOGRAM COMPLETE

## 2019-09-17 LAB — RETIC PANEL
Immature Retic Fract: 34.6 % — ABNORMAL HIGH (ref 2.3–15.9)
RBC.: 3.25 MIL/uL — ABNORMAL LOW (ref 4.22–5.81)
Retic Count, Absolute: 89 10*3/uL (ref 19.0–186.0)
Retic Ct Pct: 2.7 % (ref 0.4–3.1)
Reticulocyte Hemoglobin: 27.3 pg — ABNORMAL LOW (ref >27.9)

## 2019-09-17 MED ORDER — ACETAMINOPHEN 325 MG PO TABS
650.0000 mg | ORAL_TABLET | Freq: Four times a day (QID) | ORAL | Status: DC | PRN
  Administered 2019-09-17: 650 mg via ORAL
  Filled 2019-09-17: qty 2

## 2019-09-17 MED ORDER — POLYSACCHARIDE IRON COMPLEX 150 MG PO CAPS: 150.0000 mg | ORAL_CAPSULE | Freq: Every day | ORAL | 0 refills | Status: DC

## 2019-09-17 MED ORDER — CYANOCOBALAMIN 500 MCG PO TABS: 500.0000 ug | ORAL_TABLET | Freq: Every day | ORAL | 0 refills | Status: DC

## 2019-09-17 MED ORDER — SODIUM CHLORIDE 0.9 % IV SOLN
510.0000 mg | Freq: Once | INTRAVENOUS | Status: AC
  Administered 2019-09-17: 510 mg via INTRAVENOUS
  Filled 2019-09-17: qty 17

## 2019-09-17 MED ORDER — CYANOCOBALAMIN 1000 MCG/ML IJ SOLN
1000.0000 ug | Freq: Once | INTRAMUSCULAR | Status: AC
  Administered 2019-09-17: 1000 ug via INTRAMUSCULAR
  Filled 2019-09-17: qty 1

## 2019-09-17 NOTE — Progress Notes (Signed)
   Subjective: Mr. Jeancharles was evaluated with father at bedside. Father states pt did well overnight and appears to be acting himself.   Discussed echo and lab results. Pt's father does not recall that pt has ever been anemic. Notes pt had a colonoscopy over 10 years ago that was unremarkable. We emphasized importance of this getting follow-up outpatient with colonoscopy and that we would replete his iron and B12 while he is here. All questions and concerns addressed.    Objective:  Vital signs in last 24 hours: Vitals:   09/16/19 1600 09/16/19 1756 09/16/19 2329 09/17/19 0621  BP: (!) 123/103 (!) 130/49 123/76 120/83  Pulse:  (!) 54 68 77  Resp: 12 16 17 17   Temp:  98 F (36.7 C) 99 F (37.2 C) 99.5 F (37.5 C)  TempSrc:  Oral Oral Oral  SpO2:  93%  99%   Physical Exam Vitals and nursing note reviewed.  Constitutional:      General: He is sleeping. He is not in acute distress.    Appearance: He is not ill-appearing.  Pulmonary:     Effort: Pulmonary effort is normal.  Musculoskeletal:     Right lower leg: No edema.     Left lower leg: No edema.  Skin:    General: Skin is warm and dry.    Assessment/Plan:  Active Problems:   Near syncope  Ardian Haberland is a 54 year-old male with a medical history of Down's syndrome, arthritis, GERD, and hyperlipidemia, who presents to the ED for evaluation of a syncopal event.   Near syncope Pt presented after episode of pre-syncope after using the restroom at his group home. Per report, it sounds like pt was alert after event but non-verbal (which is not off his baseline).  - cardiology consulted in the ED, felt pt's presentation consistent with orthostatic hypotension in the setting of dehydration - outpt follow-up scheduled for 09/30/2019.  - presentation most consistent with combination of vasovagal and orthostatic syncope in a patient with anemia - Troponin peaked 19 >> 100 >> 150 >> 107 - echo unremarkable for structural heart disease,  did show septal hypertrophy of unclear significance  - repeat EKGs stable - negative orthostatics - Cr improved with fluids 1.29 >> 1.02  Fe deficiency Anemia and B12 deficiency Hgb on admission 10.3. Normal MCV at 94. Last Hgb in chart was 13.8 in 2018. Acute drop could account for his pre-syncopal episode.  - attribute acute drop today (to 9.3) to 2L fluids given yesterday - reticulocyte index 1.3 indicating hypoproliferation - ferritin low at 6 and B12 low at 140 - will replete with IV iron and B12 injection today - discharge with oral iron and B12 supplementation daily - discussed plan with father to have pt follow-up with PCP regarding new anemia and nutritional deficiency with potential need for colonoscopy  Diet - regular Fluids - none DVT ppx - enoxaparin 40mg  subQ daily CODE STATUS - FULL CODE  Dispo: Anticipated discharge today  2019, MD 09/17/2019, 7:07 AM Pager: [phone]

## 2019-09-17 NOTE — Social Work (Signed)
CSW aware pt from group home, pt father also involved/active in pt care. CSW continuing to follow for support with disposition when medically appropriate.   Octavio Graves, MSW, LCSWA [facility] [facility]

## 2019-09-17 NOTE — Progress Notes (Addendum)
Progress Note  Patient Name: Tracy Todd Date of Encounter: 09/17/2019  Primary Cardiologist:  Donato Schultz, MD  Subjective   Complains of some pain, location unclear.  Per father, pt having knee pain from arthritis, dad is worried that he cannot walk right now.   Inpatient Medications    Scheduled Meds: . enoxaparin (LOVENOX) injection  40 mg Subcutaneous Q24H  . loperamide  2 mg Oral BID  . multivitamin with minerals  1 tablet Oral Daily  . pravastatin  40 mg Oral Daily  . sodium chloride flush  3 mL Intravenous Q12H  . sucralfate  1 g Oral BID   Continuous Infusions: . lactated ringers 100 mL/hr at 09/17/19 0322   PRN Meds: acetaminophen   Vital Signs    Vitals:   09/16/19 1600 09/16/19 1756 09/16/19 2329 09/17/19 0621  BP: (!) 123/103 (!) 130/49 123/76 120/83  Pulse:  (!) 54 68 77  Resp: 12 16 17 17   Temp:  98 F (36.7 C) 99 F (37.2 C) 99.5 F (37.5 C)  TempSrc:  Oral Oral Oral  SpO2:  93%  99%    Intake/Output Summary (Last 24 hours) at 09/17/2019 0805 Last data filed at 09/17/2019 0700 Gross per 24 hour  Intake 1625.2 ml  Output --  Net 1625.2 ml   There were no vitals filed for this visit.  Weight change:  Orthostatic VS for the past 24 hrs (Last 3 readings):  BP- Lying Pulse- Lying BP- Sitting Pulse- Sitting BP- Standing at 3 minutes Pulse- Standing at 3 minutes  09/16/19 1149 117/40 (!) 44 123/66 74 104/72 82     Telemetry    SR, mostly sinus brady, high 40's overnight - Personally Reviewed  ECG    01/26 ECG is SR, HR 64, sinus arrhythmia, +LVH, anterolateral T waves still biphasic - Personally Reviewed  Physical Exam   General: Well developed, well nourished, male appearing in no acute distress. Head: +Down's facies, Normocephalic, atraumatic.  Neck: Supple without bruits, JVD not elevated. Lungs:  Resp regular and unlabored, CTA. Heart: RRR, S1, S2, no S3, S4, 2-3/6 murmur; no rub. Abdomen: Soft, non-tender, non-distended with  normoactive bowel sounds. No hepatomegaly. No rebound/guarding. No obvious abdominal masses. Extremities: No clubbing, cyanosis, no edema. Distal pedal pulses are 2+ bilaterally. Neuro: Alert and oriented X 3. Moves all extremities spontaneously. Psych: Normal affect.  Labs    Hematology Recent Labs  Lab 09/16/19 0816 09/17/19 0228  WBC 5.6 6.7  RBC 3.61* 3.29*  HGB 10.3* 9.3*  HCT 34.8* 31.0*  MCV 96.4 94.2  MCH 28.5 28.3  MCHC 29.6* 30.0  RDW 14.6 15.0  PLT 265 222    Chemistry Recent Labs  Lab 09/16/19 0816 09/17/19 0228  NA 141 139  K 4.2 3.7  CL 106 105  CO2 25 25  GLUCOSE 108* 85  BUN 22* 13  CREATININE 1.29* 1.02  CALCIUM 8.6* 8.3*  PROT 6.2*  --   ALBUMIN 3.3*  --   AST 25  --   ALT 18  --   ALKPHOS 52  --   BILITOT 0.5  --   GFRNONAA >60 >60  GFRAA >60 >60  ANIONGAP 10 9     High Sensitivity Troponin:   Recent Labs  Lab 09/16/19 0816 09/16/19 1036 09/16/19 1344 09/16/19 1959  TROPONINIHS 19* 101* 154* 107*      DDimer  Recent Labs  Lab 09/16/19 1036  DDIMER 2.20*     Radiology    CT  HEAD WO CONTRAST  Result Date: 09/16/2019 CLINICAL DATA:  Syncopal episode. Patient was unresponsive for 10 minutes after syncopal episode. Syncope upon standing up after using the restroom. Initial encounter. EXAM: CT HEAD WITHOUT CONTRAST CT CERVICAL SPINE WITHOUT CONTRAST TECHNIQUE: Multidetector CT imaging of the head and cervical spine was performed following the standard protocol without intravenous contrast. Multiplanar CT image reconstructions of the cervical spine were also generated. COMPARISON:  None. FINDINGS: CT HEAD FINDINGS Brain: Mild generalized atrophy is present. Ventricular enlargement is proportionate to the degree of atrophy. The aqueduct is patent. No acute infarct, hemorrhage, or mass lesion is present. No significant extraaxial fluid collection is present. The brainstem and cerebellum are within normal limits. Vascular: Atherosclerotic  calcifications are present within the cavernous internal carotid arteries bilaterally. Hyperdense vessel is present. Skull: Calvarium is intact. No focal lytic or blastic lesions are present. No significant extracranial soft tissue lesion is present. Sinuses/Orbits: The paranasal sinuses and mastoid air cells are clear. The globes and orbits are within normal limits. CT CERVICAL SPINE FINDINGS Alignment: Mild subluxation at C1-2 is consistent with down syndrome. The plan toe dens interval is 4.5 mm. Skull base and vertebrae: Craniocervical junction is otherwise within normal limits. Vertebral body heights maintained. No acute or healing fractures are present. Soft tissues and spinal canal: No prevertebral fluid or swelling. No visible canal hematoma. Disc levels: Advanced endplate degenerative changes present from C2-3 through C6-7. Osseous foraminal narrowing is greatest at C3-4 on the right C4-5 on the left. Upper chest: Lung apices are clear. Thoracic inlet is within normal limits. IMPRESSION: 1. No acute trauma to the head or cervical spine. 2. Mild subluxation at C1-2 is consistent with down syndrome. 3. Multilevel degenerative changes of the cervical spine without acute or healing fracture. Electronically Signed   By: Marin Roberts M.D.   On: 09/16/2019 08:53   CT Angio Chest PE W and/or Wo Contrast  Result Date: 09/16/2019 CLINICAL DATA:  Syncopal episode today. Elevated D-dimer. EXAM: CT ANGIOGRAPHY CHEST WITH CONTRAST TECHNIQUE: Multidetector CT imaging of the chest was performed using the standard protocol during bolus administration of intravenous contrast. Multiplanar CT image reconstructions and MIPs were obtained to evaluate the vascular anatomy. CONTRAST:  OMNIPAQUE IOHEXOL 350 MG/ML SOLN COMPARISON:  Chest x-ray dated 09/16/2019 FINDINGS: Cardiovascular: Satisfactory opacification of the pulmonary arteries to the segmental level. No evidence of pulmonary embolism. Normal heart size.  No pericardial effusion. Mediastinum/Nodes: No enlarged mediastinal, hilar, or axillary lymph nodes. Thyroid gland, trachea, and esophagus demonstrate no significant findings. Lungs/Pleura: Lungs are clear. No pleural effusion or pneumothorax. Upper Abdomen: No acute abnormality. Musculoskeletal: No chest wall abnormality. No acute or significant osseous findings. Review of the MIP images confirms the above findings. IMPRESSION: Normal exam. Electronically Signed   By: Francene Boyers M.D.   On: 09/16/2019 13:31   CT CERVICAL SPINE WO CONTRAST  Result Date: 09/16/2019 CLINICAL DATA:  Syncopal episode. Patient was unresponsive for 10 minutes after syncopal episode. Syncope upon standing up after using the restroom. Initial encounter. EXAM: CT HEAD WITHOUT CONTRAST CT CERVICAL SPINE WITHOUT CONTRAST TECHNIQUE: Multidetector CT imaging of the head and cervical spine was performed following the standard protocol without intravenous contrast. Multiplanar CT image reconstructions of the cervical spine were also generated. COMPARISON:  None. FINDINGS: CT HEAD FINDINGS Brain: Mild generalized atrophy is present. Ventricular enlargement is proportionate to the degree of atrophy. The aqueduct is patent. No acute infarct, hemorrhage, or mass lesion is present. No significant extraaxial fluid  collection is present. The brainstem and cerebellum are within normal limits. Vascular: Atherosclerotic calcifications are present within the cavernous internal carotid arteries bilaterally. Hyperdense vessel is present. Skull: Calvarium is intact. No focal lytic or blastic lesions are present. No significant extracranial soft tissue lesion is present. Sinuses/Orbits: The paranasal sinuses and mastoid air cells are clear. The globes and orbits are within normal limits. CT CERVICAL SPINE FINDINGS Alignment: Mild subluxation at C1-2 is consistent with down syndrome. The plan toe dens interval is 4.5 mm. Skull base and vertebrae:  Craniocervical junction is otherwise within normal limits. Vertebral body heights maintained. No acute or healing fractures are present. Soft tissues and spinal canal: No prevertebral fluid or swelling. No visible canal hematoma. Disc levels: Advanced endplate degenerative changes present from C2-3 through C6-7. Osseous foraminal narrowing is greatest at C3-4 on the right C4-5 on the left. Upper chest: Lung apices are clear. Thoracic inlet is within normal limits. IMPRESSION: 1. No acute trauma to the head or cervical spine. 2. Mild subluxation at C1-2 is consistent with down syndrome. 3. Multilevel degenerative changes of the cervical spine without acute or healing fracture. Electronically Signed   By: San Morelle M.D.   On: 09/16/2019 08:53   DG Chest Portable 1 View  Result Date: 09/16/2019 CLINICAL DATA:  Syncopal episode EXAM: PORTABLE CHEST 1 VIEW COMPARISON:  Noted FINDINGS: The heart size and mediastinal contours are within normal limits. Both lungs are clear. No pleural effusion. The visualized skeletal structures are unremarkable. IMPRESSION: No acute process in the chest Electronically Signed   By: Macy Mis M.D.   On: 09/16/2019 10:25     Cardiac Studies   ECHO: Normal ejection fraction, hyperdynamic in fact with mid left ventricular septal hypertrophy with near cavity obliteration.  There is no dynamic outflow tract gradient however measured.  Patient Profile     54 y.o. male w/ hx Down's syndrome, no prev cardiac issues, was admitted 01/25 with syncope.   Assessment & Plan    1. Syncope - most likely orthostatic hypotension - improved w/ IVF, BUN/Cr normalized - resting bradycardia, but no evidence of chronotropic incompetence - echo pending, called to request it be done early so dad can be there, he has to leave for a while - mild trop elevation unclear significance, no reports of chest pain, ever - once echo reviewed, MD advise on further workup  Otherwise, per  IM Active Problems:   Near syncope    Jonetta Speak , PA-C 8:05 AM 09/17/2019 Pager: [phone]  Personally seen and examined. Agree with above.   Resting comfortably in bed, arousable.  Spoke to his father.  As Suanne Marker stated, he is concerned that if he does not get out of the bed and walk that he may not be able to walk.  Telemetry personally reviewed showing no adverse arrhythmias no VT no AF.  No pauses.  Sinus bradycardia noted during night, benign.  Normal chronotropic competence during the day.  Echocardiogram personally reviewed shows hyperdynamic function especially in the mid cavitary/apical region of the left ventricle with some left ventricle thickening, septal hypertrophy.  May be a type of hypertrophic variant.  I discussed this with his father.  Treatment would be to continue with adequate hydration, perhaps a sports drink such as Gatorade daily to avoid mid cavitary obliteration and potentiation of orthostatic hypotension.  I also discussed with the father that I would not place an event monitor on him because it would not ultimately change therapy and  it would be very challenging for him with his Down's syndrome to be compliant with monitoring.  His mildly elevated troponin that peaked at 150 high-sensitivity now down to 106 is a result of myocardial injury in the setting of syncope, likely transient hypotension.  Does not require stress test or other testing.  No evidence of PE on CT.  Agree that anemia is present and may be potentiating some of the symptoms.  This morning 9.3 after hydration.  Normal MCV however his ferritin was 6, pointing towards iron deficiency anemia.  Does appear to be somewhat pale on exam.  Prior hemoglobin a few years ago was 13.  Question how aggressive we should be with further procedures and of course this would have to be in correspondence with his father.  Further work-up per maintain taking care of him.  Question if his hemoglobins  could be trended as an outpatient, could he be empirically treated with PPI/iron supplementation?  From a cardiac perspective, I am okay with discharge back to his group home where he is at his most comfortable.   Donato Schultz, MD

## 2019-09-17 NOTE — Discharge Summary (Signed)
Name: Tracy Todd MRN: [id number] DOB: 04-18-1966 54 y.o. PCP: Chesley Noon, MD  Date of Admission: 09/16/2019  8:11 AM Date of Discharge: 09/17/2019 Attending Physician: Dr. Lalla Brothers  Discharge Diagnosis: Principal Problem:   Syncope Active Problems:   Down syndrome   Iron deficiency anemia   Elevated troponin  Discharge Medications: Allergies as of 09/17/2019      Reactions   Penicillins    unknown      Medication List    TAKE these medications   diclofenac 75 MG EC tablet Commonly known as: VOLTAREN Take 75 mg by mouth 2 (two) times daily.   FIBER ADULT GUMMIES PO Take 2 tablets by mouth daily.   iron polysaccharides 150 MG capsule Commonly known as: Nu-Iron Take 1 capsule (150 mg total) by mouth daily.   ketoconazole 2 % shampoo Commonly known as: NIZORAL Apply 1 application topically 2 (two) times a week.   loperamide 2 MG capsule Commonly known as: IMODIUM Take 2 mg by mouth 2 (two) times daily.   Micatin 2 % cream Generic drug: miconazole Apply 1 application topically 2 (two) times daily. Apply to feet   multivitamin with minerals tablet Take 1 tablet by mouth daily.   nystatin cream Commonly known as: MYCOSTATIN Apply 1 application topically 3 (three) times daily as needed for dry skin.   pravastatin 40 MG tablet Commonly known as: PRAVACHOL Take 40 mg by mouth daily.   Selenium Sulfide 2.3 % Sham Apply 1 application topically See admin instructions. Wash hair and face, alternate weekly with Nizoral shampoo   sucralfate 1 g tablet Commonly known as: CARAFATE Take 1 g by mouth 2 (two) times daily.   vitamin B-12 500 MCG tablet Commonly known as: CYANOCOBALAMIN Take 1 tablet (500 mcg total) by mouth daily.       Disposition and follow-up:   Mr.Bayley Hamblen was discharged from [facility] in Good condition.  At the hospital follow up visit please address:  1.  Syncope - suspect pt's transient loss of  consciousness secondary to vasovagal response after toileting and orthostatic hypotension after standing - maintain hydration and Fe/B12 supplementation to treat anemia and reduce risk for future syncopal episodes - pt has follow-up with cardiology scheduled for 09/30/2019  Anemia Fe deficiency and B12 deficiency - pt with Ferritin of 6 and B12 level of 140 - given IV iron infusion and B12 injection and discharged with daily oral iron and B12 - continuing monitoring of new anemia - assess risk vs benefit of colonoscopy evaluation for new Fe deficiency in this adult   2.  Labs / imaging needed at time of follow-up: repeat CBC  3.  Pending labs/ test needing follow-up: NONE  Follow-up Appointments: Follow-up Information    [facility] Follow up on 09/25/2019.   Specialty: Cardiology Why: at 7:35 AM for echocardiogram of the heart  Contact information: [address], [address] [place]       Dorothy Spark, MD Follow up on 09/30/2019.   Specialty: Cardiology Why: at 10:30 AM   Contact information: [place] [address] [phone]           Hospital Course by problem list: 1. Tracy Todd is a 54 year-old male with a medical history of Down's syndrome, arthritis, GERD, and hyperlipidemia, who presents to the ED for evaluation of transient loss of consciousness. Pt experienced an episode of pre-syncope after  using the restroom at his group home. Per report, it sounds like pt was alert after event but non-verbal (which is not off his baseline). No evidence of seizure or post-ictal period. History consistent with combination of situation syncope after having a bowel movement and orthostatic hypotension. In the ED, orthostatic vital signs were negative. Echo was unremarkable for structural heart disease. His troponin trended 19 >> 100 >> 150 >> 107. Cardiology was consulted and felt pt's  episode was consistent with orthostatic hypotension and troponin elevation was secondary to hypotension during this event. Recommended outpatient follow-up.  Of note, lab work was remarkable for a new anemia with Hgb on admission 10.8. Normocytic with MCV 94. Ferritin low at 6 and Vitamin B12 low at 140. No evidence of active bleed or frank blood loss. This new anemia would predispose this pt to situational syncope/orthostatic hypotension. Pt was given IV iron infusion and B12 injection while inpatient, and prescriptions for daily iron/B12 on discharge. Would recommend further evaluation with colonoscopy, pending shared-decision making and risk/benefit discussions with pt's medical decision maker.  Discharge Vitals:   BP 90/70 (BP Location: Right Arm)   Pulse 76   Temp (!) 95.5 F (35.3 C) (Oral)   Resp 18   SpO2 97%   Pertinent Labs, Studies, and Procedures:  CBC Latest Ref Rng & Units 09/17/2019 09/16/2019  WBC 4.0 - 10.5 K/uL 6.7 5.6  Hemoglobin 13.0 - 17.0 g/dL 9.3(Y) 10.3(L)  Hematocrit 39.0 - 52.0 % 31.0(L) 34.8(L)  Platelets 150 - 400 K/uL 222 265   BMP Latest Ref Rng & Units 09/17/2019 09/16/2019  Glucose 70 - 99 mg/dL 85 101(B)  BUN 6 - 20 mg/dL 13 51(W)  Creatinine 2.58 - 1.24 mg/dL 5.27 7.82(U)  Sodium 235 - 145 mmol/L 139 141  Potassium 3.5 - 5.1 mmol/L 3.7 4.2  Chloride 98 - 111 mmol/L 105 106  CO2 22 - 32 mmol/L 25 25  Calcium 8.9 - 10.3 mg/dL 8.3(L) 8.6(L)   Cardiac Panel (last 3 results) Recent Labs    09/16/19 1036 09/16/19 1344 09/16/19 1959  TROPONINIHS 101* 154* 107*   Lab Results  Component Value Date   FERRITIN 6 (L) 09/16/2019   Lab Results  Component Value Date   VITAMINB12 140 (L) 09/16/2019   Lab Results  Component Value Date   RETICCTPCT 2.7 09/17/2019   Lab Results  Component Value Date   TSH 1.941 09/16/2019   Lab Results  Component Value Date   DDIMER 2.20 (H) 09/16/2019   ECHO 09/17/2019 IMPRESSIONS  1. Left ventricular ejection  fraction, by visual estimation, is 60 to 65%. The left ventricle has normal function. There is mild left ventricular hypertrophy. The pattern of hypertrophy was atypical, located mostly mid-cavity. There was no intracavitary  flow accceleration/gradient.  2. Left ventricular diastolic parameters are consistent with Grade II diastolic dysfunction (pseudonormalization).  3. The left ventricle has no regional wall motion abnormalities.  4. Global right ventricle has normal systolic function.The right ventricular size is normal. No increase in right ventricular wall thickness.  5. Left atrial size was normal.  6. Right atrial size was normal.  7. The mitral valve is normal in structure. No evidence of mitral valve regurgitation. No evidence of mitral stenosis.  8. The tricuspid valve is normal in structure.  9. The tricuspid valve is normal in structure. Tricuspid valve regurgitation is not demonstrated. 10. The aortic valve is normal in structure. Aortic valve regurgitation is not visualized. No evidence of aortic valve sclerosis or  stenosis. 11. The pulmonic valve was normal in structure. Pulmonic valve regurgitation is not visualized. 12. The inferior vena cava is normal in size with greater than 50% respiratory variability, suggesting right atrial pressure of 3 mmHg.   CT HEAD 09/16/2019 CLINICAL DATA:  Syncopal episode. Patient was unresponsive for 10 minutes after syncopal episode. Syncope upon standing up after using the restroom. Initial encounter.  EXAM: CT HEAD WITHOUT CONTRAST  CT CERVICAL SPINE WITHOUT CONTRAST  TECHNIQUE: Multidetector CT imaging of the head and cervical spine was performed following the standard protocol without intravenous contrast. Multiplanar CT image reconstructions of the cervical spine were also generated.  COMPARISON:  None.  FINDINGS: CT HEAD FINDINGS  Brain: Mild generalized atrophy is present. Ventricular enlargement is proportionate to  the degree of atrophy. The aqueduct is patent.  No acute infarct, hemorrhage, or mass lesion is present. No significant extraaxial fluid collection is present. The brainstem and cerebellum are within normal limits.  Vascular: Atherosclerotic calcifications are present within the cavernous internal carotid arteries bilaterally. Hyperdense vessel is present.  Skull: Calvarium is intact. No focal lytic or blastic lesions are present. No significant extracranial soft tissue lesion is present.  Sinuses/Orbits: The paranasal sinuses and mastoid air cells are clear. The globes and orbits are within normal limits.  CT CERVICAL SPINE FINDINGS  Alignment: Mild subluxation at C1-2 is consistent with down syndrome. The plan toe dens interval is 4.5 mm.  Skull base and vertebrae: Craniocervical junction is otherwise within normal limits. Vertebral body heights maintained. No acute or healing fractures are present.  Soft tissues and spinal canal: No prevertebral fluid or swelling. No visible canal hematoma.  Disc levels: Advanced endplate degenerative changes present from C2-3 through C6-7. Osseous foraminal narrowing is greatest at C3-4 on the right C4-5 on the left.  Upper chest: Lung apices are clear. Thoracic inlet is within normal limits.  IMPRESSION: 1. No acute trauma to the head or cervical spine. 2. Mild subluxation at C1-2 is consistent with down syndrome. 3. Multilevel degenerative changes of the cervical spine without acute or healing fracture.   CXR 09/16/2019 CLINICAL DATA:  Syncopal episode  EXAM: PORTABLE CHEST 1 VIEW  COMPARISON:  Noted  FINDINGS: The heart size and mediastinal contours are within normal limits. Both lungs are clear. No pleural effusion. The visualized skeletal structures are unremarkable.  IMPRESSION: No acute process in the chest  CTA 09/16/2019 CLINICAL DATA:  Syncopal episode today. Elevated D-dimer.  EXAM: CT  ANGIOGRAPHY CHEST WITH CONTRAST  TECHNIQUE: Multidetector CT imaging of the chest was performed using the standard protocol during bolus administration of intravenous contrast. Multiplanar CT image reconstructions and MIPs were obtained to evaluate the vascular anatomy.  CONTRAST:  OMNIPAQUE IOHEXOL 350 MG/ML SOLN  COMPARISON:  Chest x-ray dated 09/16/2019  FINDINGS: Cardiovascular: Satisfactory opacification of the pulmonary arteries to the segmental level. No evidence of pulmonary embolism. Normal heart size. No pericardial effusion.  Mediastinum/Nodes: No enlarged mediastinal, hilar, or axillary lymph nodes. Thyroid gland, trachea, and esophagus demonstrate no significant findings.  Lungs/Pleura: Lungs are clear. No pleural effusion or pneumothorax.  Upper Abdomen: No acute abnormality.  Musculoskeletal: No chest wall abnormality. No acute or significant osseous findings.  Review of the MIP images confirms the above findings.  IMPRESSION: Normal exam.  Discharge Instructions: Discharge Instructions    Call MD for:  difficulty breathing, headache or visual disturbances   Complete by: As directed    Call MD for:  extreme fatigue   Complete by: As  directed    Call MD for:  hives   Complete by: As directed    Call MD for:  persistant dizziness or light-headedness   Complete by: As directed    Call MD for:  persistant nausea and vomiting   Complete by: As directed    Call MD for:  redness, tenderness, or signs of infection (pain, swelling, redness, odor or green/yellow discharge around incision site)   Complete by: As directed    Call MD for:  severe uncontrolled pain   Complete by: As directed    Call MD for:  temperature >100.4   Complete by: As directed    Diet - low sodium heart healthy   Complete by: As directed    Discharge instructions   Complete by: As directed    Mr. Jeremyah, Jelley were seen in the hospital after passing out at home.  Imaging did not show any head injuries or changes in the structure of the heart. Your lab work did show a low blood count (called anemia), and low iron and vitamin B12 levels. You were given a B12 injection and IV iron while in the hospital. Please follow-up with your primary care doctor for discussion regarding future treatment and monitoring of these lab abnormalities, as continued work-up for the iron deficiency would include a colonoscopy in the furture.    Both the heart and medicine doctors who evaluated you in the hospital believe your passing out was due to a combination of things (the low blood counts, a drop in blood pressure upon standing, and the stimulus after using the toilet), and no further work-up or monitoring in the hospital is needed.  Thank you for letting us be a part of your care!   Increase activity slowly   Complete by: As directed       Signed: Thom Chimes, MD 09/17/2019, 1:14 PM   Pager: [phone]

## 2019-09-17 NOTE — Evaluation (Signed)
Physical Therapy Evaluation Patient Details Name: Tracy Todd MRN: [id number] DOB: [birth year] Today's Date: 09/17/2019   History of Present Illness  Pt is a 54 y/o male with a PMH significant for Down Syndrome, who presents s/p syncopal episode at Group Home.   Clinical Impression  Pt admitted with above diagnosis. At the time of PT eval pt was able to perform transfers and ambulation with gross min guard assist to min assist and no AD (HHA only). Pt did fairly well following commands, and during session we ambulated from bed to bathroom, then to sink to wash hands, then to recliner to sit. Noted some hesitation in utilizing RUE but did not get a good sense of whether pt was in pain or if there was R side inattention. Pt is right handed per father. Pt currently with functional limitations due to the deficits listed below (see PT Problem List). Pt will benefit from skilled PT to increase their independence and safety with mobility to allow discharge to the venue listed below.       Follow Up Recommendations No PT follow up;Supervision/Assistance - 24 hour    Equipment Recommendations  None recommended by PT    Recommendations for Other Services       Precautions / Restrictions Precautions Precautions: Fall Restrictions Weight Bearing Restrictions: No      Mobility  Bed Mobility Overal bed mobility: Needs Assistance Bed Mobility: Supine to Sit     Supine to sit: Mod assist     General bed mobility comments: Pt initially attempted to sit straight up in bed (HOB elevated to start with), however grabbed his abdomen and grimaced. When asked if his belly hurt he said "yes". Mod assist provided with the bed pad to scoot around and get feet on the floor.   Transfers Overall transfer level: Needs assistance Equipment used: 1 person hand held assist Transfers: Sit to/from Stand Sit to Stand: Min guard         General transfer comment: Pt was able to power-up to full stand without  assistance. Before initiating gait, pt appeared unsteady and required HHA.   Ambulation/Gait Ambulation/Gait assistance: Min assist Gait Distance (Feet): 35 Feet Assistive device: 1 person hand held assist Gait Pattern/deviations: Decreased stride length;Shuffle;Step-through pattern(Toe walking) Gait velocity: Decreased Gait velocity interpretation: <1.8 ft/sec, indicate of risk for recurrent falls General Gait Details: Pt ambulated to bathroom, then to sink to wash hands, then to recliner. About 35 feet total. Pt grossly shuffling around room with most weight on balls of feet. Noted x2 instances where pt dragging LLE or required increased time to advance LLE but not consistent.   Stairs            Wheelchair Mobility    Modified Rankin (Stroke Patients Only)       Balance Overall balance assessment: Needs assistance Sitting-balance support: No upper extremity supported;Feet supported Sitting balance-Leahy Scale: Good     Standing balance support: No upper extremity supported;During functional activity Standing balance-Leahy Scale: Fair Standing balance comment: Pt able to stand at sink and wash hands without assistance.                              Pertinent Vitals/Pain      Home Living Family/patient expects to be discharged to:: Group home                 Additional Comments: Pt's father reports pt will not have to  do any stairs. Unsure of available DME or bathroom set-up.     Prior Function Level of Independence: Needs assistance   Gait / Transfers Assistance Needed: Father reports pt shuffles and walks on his toes at baseline. Does not use an AD at baseline.   ADL's / Homemaking Assistance Needed: Father reports pt is able to bathe and dress with assistance. Not able to do much but is able to help some.         Hand Dominance        Extremity/Trunk Assessment   Upper Extremity Assessment Upper Extremity Assessment: RUE  deficits/detail RUE Deficits / Details: Appears to have limited shoulder flexion bilaterally. Pt is right handed per father however noted that pt was not utilizing RUE for functional activity. Attempted to facilitate RUE reaching out for soap dispensor and pt shaking head no. Pt only washing L hand by wiggling fingers across palm, and not attempting to wash L hand or use both hands together to wash without therapist assist.     Lower Extremity Assessment Lower Extremity Assessment: Difficult to assess due to impaired cognition;RLE deficits/detail RLE Deficits / Details: Not able to MMT. Pt laying in bed with LLE extended out and RLE flexed/abducted/externally rotated with R ankle under L knee. Endorses pain in knees but did not appear to be painful during functional mobility.     Cervical / Trunk Assessment Cervical / Trunk Assessment: Other exceptions Cervical / Trunk Exceptions: Forward head posture with rounded shoulders  Communication      Cognition Arousal/Alertness: Awake/alert Behavior During Therapy: WFL for tasks assessed/performed;Flat affect Overall Cognitive Status: History of cognitive impairments - at baseline                                 General Comments: Likely baseline      General Comments      Exercises     Assessment/Plan    PT Assessment Patient needs continued PT services  PT Problem List Decreased strength;Decreased activity tolerance;Decreased mobility;Decreased balance;Decreased knowledge of use of DME;Decreased safety awareness       PT Treatment Interventions DME instruction;Gait training;Functional mobility training;Therapeutic activities;Therapeutic exercise;Neuromuscular re-education;Patient/family education    PT Goals (Current goals can be found in the Care Plan section)  Acute Rehab PT Goals Patient Stated Goal: None stated PT Goal Formulation: With family Time For Goal Achievement: 09/24/19 Potential to Achieve Goals:  Good    Frequency Min 3X/week   Barriers to discharge        Co-evaluation               AM-PAC PT "6 Clicks" Mobility  Outcome Measure Help needed turning from your back to your side while in a flat bed without using bedrails?: A Little Help needed moving from lying on your back to sitting on the side of a flat bed without using bedrails?: A Little Help needed moving to and from a bed to a chair (including a wheelchair)?: A Little Help needed standing up from a chair using your arms (e.g., wheelchair or bedside chair)?: A Little Help needed to walk in hospital room?: A Little Help needed climbing 3-5 steps with a railing? : A Little 6 Click Score: 18    End of Session Equipment Utilized During Treatment: Gait belt Activity Tolerance: Patient tolerated treatment well Patient left: in chair;with call bell/phone within reach;with chair alarm set;with family/visitor present Nurse Communication: Mobility status PT Visit  Diagnosis: Unsteadiness on feet (R26.81);Difficulty in walking, not elsewhere classified (R26.2)    Time: 1030-1105 PT Time Calculation (min) (ACUTE ONLY): 35 min   Charges:   PT Evaluation $PT Eval Moderate Complexity: 1 Mod PT Treatments $Gait Training: 8-22 mins        Conni Slipper, PT, DPT Acute Rehabilitation Services Pager: [phone] Office: [phone]   Marylynn Pearson 09/17/2019, 11:51 AM

## 2019-09-17 NOTE — Progress Notes (Signed)
  Echocardiogram 2D Echocardiogram has been performed.  Tye Savoy 09/17/2019, 8:56 AM

## 2019-09-25 ENCOUNTER — Other Ambulatory Visit (HOSPITAL_COMMUNITY): Payer: Medicare Other

## 2019-09-30 ENCOUNTER — Ambulatory Visit: Payer: Medicare Other | Admitting: Cardiology

## 2019-10-06 NOTE — Progress Notes (Deleted)
Cardiology Office Note  Date: 10/07/2019   ID: Tracy Todd, DOB 1966-05-17, MRN [id number]  PCP:  Eartha Inch, MD  Cardiologist:  Donato Schultz, MD Electrophysiologist:  None   No chief complaint on file.   History of Present Illness: Tracy Todd is a 54 y.o. male with history of Down syndrome with recent transient loss of consciousness secondary to vasovagal response after toileting.  Patient stood up after bowel movement and lost consciousness for about 10 minutes.  He lives in a group home and is nonverbal.  He was taken to the emergency room on 1/25//2021.  Patient had a cardiac work-up and neuro work-up.  Patient had troponins of [id number].  He had an elevated D-dimer of 2.20.  He had a decreased hemoglobin and hematocrit. 9.3 and 31. Decreased vitamin B12 and ferritin of140 and 6 respectively. He received an iron infusion. Colonoscopy was recommended to patient's father and evaluation for new IDA  Work-up for pulmonary embolism was negative.  Head and cervical CT showed no acute findings.  Dr. Anne Fu was consulted.  Echocardiogram on 09/17/2019 showed EF of 60 to 65%, mild LVH, grade 2 diastolic dysfunction.  EKG showed marked sinus arrhythmia rate of 64 minimal voltage criteria for LVH.  Nonspecific T wave abnormality.  Past Medical History:  Diagnosis Date  . Down's syndrome     *** The histories are not reviewed yet. Please review them in the "History" navigator section and refresh this SmartLink.  Current Outpatient Medications  Medication Sig Dispense Refill  . diclofenac (VOLTAREN) 75 MG EC tablet Take 75 mg by mouth 2 (two) times daily.    Marland Kitchen FIBER ADULT GUMMIES PO Take 2 tablets by mouth daily.    . iron polysaccharides (NU-IRON) 150 MG capsule Take 1 capsule (150 mg total) by mouth daily. 30 capsule 0  . ketoconazole (NIZORAL) 2 % shampoo Apply 1 application topically 2 (two) times a week.    . loperamide (IMODIUM) 2 MG capsule Take 2 mg by mouth 2 (two)  times daily.    . miconazole (MICATIN) 2 % cream Apply 1 application topically 2 (two) times daily. Apply to feet    . Multiple Vitamins-Minerals (MULTIVITAMIN WITH MINERALS) tablet Take 1 tablet by mouth daily.    Marland Kitchen nystatin cream (MYCOSTATIN) Apply 1 application topically 3 (three) times daily as needed for dry skin.    . pravastatin (PRAVACHOL) 40 MG tablet Take 40 mg by mouth daily.    . Selenium Sulfide 2.3 % SHAM Apply 1 application topically See admin instructions. Wash hair and face, alternate weekly with Nizoral shampoo    . sucralfate (CARAFATE) 1 g tablet Take 1 g by mouth 2 (two) times daily.    . vitamin B-12 (CYANOCOBALAMIN) 500 MCG tablet Take 1 tablet (500 mcg total) by mouth daily. 30 tablet 0   No current facility-administered medications for this visit.   Allergies:  Penicillins   Social History: The patient     Family History: The patient's family history is not on file.   ROS:  Please see the history of present illness. Otherwise, complete review of systems is positive for none.  All other systems are reviewed and negative.   Physical Exam: VS:  There were no vitals taken for this visit., BMI There is no height or weight on file to calculate BMI.  Wt Readings from Last 3 Encounters:  No data found for Wt    General: Patient appears comfortable at rest. HEENT: Conjunctiva and lids  normal, oropharynx clear with moist mucosa. Neck: Supple, no elevated JVP or carotid bruits, no thyromegaly. Lungs: Clear to auscultation, nonlabored breathing at rest. Cardiac: Regular rate and rhythm, no S3 or significant systolic murmur, no pericardial rub. Abdomen: Soft, nontender, no hepatomegaly, bowel sounds present, no guarding or rebound. Extremities: No pitting edema, distal pulses 2+. Skin: Warm and dry. Musculoskeletal: No kyphosis. Neuropsychiatric: Alert and oriented x3, affect grossly appropriate.  ECG:  {EKG/Telemetry Strips Reviewed:[phone]}  Recent  Labwork: 09/16/2019: ALT 18; AST 25; Magnesium 1.9; TSH 1.941 09/17/2019: BUN 13; Creatinine, Ser 1.02; Hemoglobin 9.3; Platelets 222; Potassium 3.7; Sodium 139  No results found for: CHOL, TRIG, HDL, CHOLHDL, VLDL, LDLCALC, LDLDIRECT  Other Studies Reviewed Today:  Echocardiogram 09/17/2019  1. Left ventricular ejection fraction, by visual estimation, is 60 to 65%. The left ventricle has normal function. There is mild left ventricular hypertrophy. The pattern of hypertrophy was atypical, located mostly mid-cavity. There was no intracavitary flow accceleration/gradient. 2. Left ventricular diastolic parameters are consistent with Grade II diastolic dysfunction (pseudonormalization). 3. The left ventricle has no regional wall motion abnormalities. 4. Global right ventricle has normal systolic function.The right ventricular size is normal. No increase in right ventricular wall thickness. 5. Left atrial size was normal. 6. Right atrial size was normal. 7. The mitral valve is normal in structure. No evidence of mitral valve regurgitation. No evidence of mitral stenosis. 8. The tricuspid valve is normal in structure. 9. The tricuspid valve is normal in structure. Tricuspid valve regurgitation is not demonstrated. 10. The aortic valve is normal in structure. Aortic valve regurgitation is not visualized. No evidence of aortic valve sclerosis or stenosis. 11. The pulmonic valve was normal in structure. Pulmonic valve regurgitation is not visualized. 12. The inferior vena cava is normal in size with greater than 50% respiratory variability, suggesting right atrial pressure of 3 mmHg.  Assessment and Plan:  1. Syncope and collapse   2. Mixed hyperlipidemia   3. Iron deficiency anemia, unspecified iron deficiency anemia type    1. Syncope and collapse Syncopal episode after having a bowel movement. LOC for 10 minutes. Head and neck CT negative for acute findings. Episode thought to be vasovagal  in nature. Negative for PE work up.  Echo EF 60-65% Mild LVH, GRD 2 DD.   2. Mixed hyperlipidemia ***  3. Iron deficiency anemia, unspecified iron deficiency anemia type Initial Hgb / Hct 10.3 / 34.8. on 09/16/2019. Hgb / Hct on 09/17/2019 9.3 / 31.0. No source of blood loss. Iron transfusion was given. Colonoscopy was recommended to patients father as well as further f/u on anemia.  Medication Adjustments/Labs and Tests Ordered: Current medicines are reviewed at length with the patient today.  Concerns regarding medicines are outlined above.    There are no Patient Instructions on file for this visit.       Signed, Levell July, NP 10/07/2019 8:21 AM    [place]

## 2019-10-07 ENCOUNTER — Ambulatory Visit: Payer: Medicare Other | Admitting: Physician Assistant

## 2019-10-07 ENCOUNTER — Other Ambulatory Visit (HOSPITAL_COMMUNITY): Payer: Medicare Other

## 2019-10-12 ENCOUNTER — Other Ambulatory Visit: Payer: Self-pay

## 2019-10-12 ENCOUNTER — Emergency Department (HOSPITAL_COMMUNITY): Payer: Medicare Other

## 2019-10-12 ENCOUNTER — Emergency Department (HOSPITAL_COMMUNITY)
Admission: EM | Admit: 2019-10-12 | Discharge: 2019-10-12 | Disposition: A | Discharge status: Home / Self Care | Payer: Medicare Other | Attending: Emergency Medicine | Admitting: Emergency Medicine

## 2019-10-12 ENCOUNTER — Encounter (HOSPITAL_COMMUNITY): Payer: Self-pay | Admitting: Emergency Medicine

## 2019-10-12 DIAGNOSIS — S0003XA Contusion of scalp, initial encounter: Secondary | ICD-10-CM | POA: Insufficient documentation

## 2019-10-12 DIAGNOSIS — Y939 Activity, unspecified: Secondary | ICD-10-CM | POA: Insufficient documentation

## 2019-10-12 DIAGNOSIS — W1830XA Fall on same level, unspecified, initial encounter: Secondary | ICD-10-CM | POA: Diagnosis not present

## 2019-10-12 DIAGNOSIS — W19XXXA Unspecified fall, initial encounter: Secondary | ICD-10-CM

## 2019-10-12 DIAGNOSIS — Y999 Unspecified external cause status: Secondary | ICD-10-CM | POA: Insufficient documentation

## 2019-10-12 DIAGNOSIS — S00512A Abrasion of oral cavity, initial encounter: Secondary | ICD-10-CM | POA: Diagnosis not present

## 2019-10-12 DIAGNOSIS — R9431 Abnormal electrocardiogram [ECG] [EKG]: Secondary | ICD-10-CM

## 2019-10-12 DIAGNOSIS — Y92192 Bathroom in other specified residential institution as the place of occurrence of the external cause: Secondary | ICD-10-CM | POA: Insufficient documentation

## 2019-10-12 DIAGNOSIS — Z79899 Other long term (current) drug therapy: Secondary | ICD-10-CM | POA: Insufficient documentation

## 2019-10-12 DIAGNOSIS — Q909 Down syndrome, unspecified: Secondary | ICD-10-CM | POA: Diagnosis not present

## 2019-10-12 DIAGNOSIS — M25562 Pain in left knee: Secondary | ICD-10-CM | POA: Diagnosis not present

## 2019-10-12 LAB — I-STAT CHEM 8, ED
BUN: 14 mg/dL (ref 6–20)
Calcium, Ion: 1.18 mmol/L (ref 1.15–1.40)
Chloride: 104 mmol/L (ref 98–111)
Creatinine, Ser: 1.2 mg/dL (ref 0.61–1.24)
Glucose, Bld: 94 mg/dL (ref 70–99)
HCT: 41 % (ref 39.0–52.0)
Hemoglobin: 13.9 g/dL (ref 13.0–17.0)
Potassium: 4.4 mmol/L (ref 3.5–5.1)
Sodium: 141 mmol/L (ref 135–145)
TCO2: 31 mmol/L (ref 22–32)

## 2019-10-12 MED ORDER — IBUPROFEN 400 MG PO TABS
600.0000 mg | ORAL_TABLET | Freq: Once | ORAL | Status: AC
  Administered 2019-10-12: 600 mg via ORAL
  Filled 2019-10-12: qty 1

## 2019-10-12 MED ORDER — ACETAMINOPHEN 325 MG PO TABS
650.0000 mg | ORAL_TABLET | Freq: Once | ORAL | Status: AC
  Administered 2019-10-12: 13:00:00 650 mg via ORAL
  Filled 2019-10-12: qty 2

## 2019-10-12 MED ORDER — LORAZEPAM 2 MG/ML IJ SOLN
1.0000 mg | Freq: Once | INTRAMUSCULAR | Status: AC
  Administered 2019-10-12: 09:00:00 1 mg via INTRAMUSCULAR
  Filled 2019-10-12: qty 1

## 2019-10-12 NOTE — ED Triage Notes (Signed)
Pt in form group home via [facility] after unwitnessed fall, found down on bathroom floor, lying on L side with small pool of blood under head. Appears to have some oral bleeding present. GH denies any hx of seizures. Hx of Downs Syndrome. C Collar present, able to MAE's,

## 2019-10-12 NOTE — ED Provider Notes (Signed)
[facility] EMERGENCY DEPARTMENT Provider Note   CSN: [id number] Arrival date & time: 10/12/19  4982     History Chief Complaint  Patient presents with  . Loss of Consciousness  . Fall    Tracy Todd is a 54 y.o. male.  HPI 5 caveat   53 year old male history of Down syndrome presents today from his group home with reports that he fell in the bathroom today.  Patient is unable to give any history on his own.  History is obtained from EMS.  EMS reports that there was some bleeding from his mouth and contusion to his head. No other injuries were noted.  Staff did not witness the fall.  There is no report of seizure disorder.  Patient appears somewhat confused but is difficult to tell if this is baseline or not. Later in the visit father at the bedside and patient appears somewhat sleepy to him.  This is after he received Ativan for his scans.  Otherwise he recognizes his father and appears to be at baseline per his father.  Past Medical History:  Diagnosis Date  . Down's syndrome     Patient Active Problem List   Diagnosis Date Noted  . Iron deficiency anemia 09/17/2019  . Elevated troponin 09/17/2019  . Syncope 09/16/2019  . Down syndrome 12/27/2012  . Arthritis 10/25/2011  . Hyperlipidemia 10/25/2011    History reviewed. No pertinent surgical history.     No family history on file.  Social History   Tobacco Use  . Smoking status: Never Smoker  . Smokeless tobacco: Never Used  Substance Use Topics  . Alcohol use: Never  . Drug use: Never    Home Medications Prior to Admission medications   Medication Sig Start Date End Date Taking? Authorizing Provider  diclofenac (VOLTAREN) 75 MG EC tablet Take 75 mg by mouth 2 (two) times daily. 10/25/11   [provider]  FIBER ADULT GUMMIES PO Take 2 tablets by mouth daily.    [provider]  iron polysaccharides (NU-IRON) 150 MG capsule Take 1 capsule (150 mg total) by mouth daily.  09/17/19   Ladona Horns, MD  ketoconazole (NIZORAL) 2 % shampoo Apply 1 application topically 2 (two) times a week. 08/19/19   [provider]  loperamide (IMODIUM) 2 MG capsule Take 2 mg by mouth 2 (two) times daily.    [provider]  miconazole (MICATIN) 2 % cream Apply 1 application topically 2 (two) times daily. Apply to feet    [provider]  Multiple Vitamins-Minerals (MULTIVITAMIN WITH MINERALS) tablet Take 1 tablet by mouth daily.    [provider]  nystatin cream (MYCOSTATIN) Apply 1 application topically 3 (three) times daily as needed for dry skin. 01/21/19   [provider]  pravastatin (PRAVACHOL) 40 MG tablet Take 40 mg by mouth daily. 10/25/11   [provider]  Selenium Sulfide 2.3 % SHAM Apply 1 application topically See admin instructions. Wash hair and face, alternate weekly with Nizoral shampoo 12/09/11   [provider]  sucralfate (CARAFATE) 1 g tablet Take 1 g by mouth 2 (two) times daily. 01/03/19   [provider]  vitamin B-12 (CYANOCOBALAMIN) 500 MCG tablet Take 1 tablet (500 mcg total) by mouth daily. 09/17/19   Ladona Horns, MD    Allergies    Penicillins  Review of Systems   Review of Systems  Unable to perform ROS: Other    Physical Exam Updated Vital Signs BP 128/82  Pulse 65   Temp 98.1 F (36.7 C) (Axillary)   Resp 16   SpO2 98%   Physical Exam Vitals and nursing note reviewed.  Constitutional:      General: He is in acute distress.     Appearance: Normal appearance. He is not ill-appearing.  HENT:     Head: Normocephalic and atraumatic.     Right Ear: External ear normal.     Left Ear: External ear normal.     Nose: Nose normal.     Mouth/Throat:     Comments: Dried blood at the sides of mouth Teeth appear intact Abrasion left anterolateral tongue Eyes:     Extraocular Movements: Extraocular movements intact.     Pupils: Pupils are equal, round, and reactive to light.    Neck:     Comments: Cervical collar in place Cardiovascular:     Rate and Rhythm: Normal rate and regular rhythm.     Comments: No external signs of trauma to the chest wall Pulmonary:     Effort: Pulmonary effort is normal.     Breath sounds: Normal breath sounds.  Abdominal:     General: Abdomen is flat.     Palpations: Abdomen is soft.  Musculoskeletal:        General: Normal range of motion.     Cervical back: Normal range of motion.  Skin:    General: Skin is warm and dry.     Capillary Refill: Capillary refill takes less than 2 seconds.  Neurological:     Mental Status: He is alert.     Comments: Patient appears to move all extremities equally He appears to verbalize without difficulty.     ED Results / Procedures / Treatments   Labs (all labs ordered are listed, but only abnormal results are displayed) Labs Reviewed - No data to display  EKG None  Radiology CT Head Wo Contrast  Result Date: 10/12/2019 CLINICAL DATA:  Unwitnessed fall on bathroom floor. Neck trauma. History of down syndrome. EXAM: CT HEAD WITHOUT CONTRAST CT MAXILLOFACIAL WITHOUT CONTRAST CT CERVICAL SPINE WITHOUT CONTRAST TECHNIQUE: Multidetector CT imaging of the head, cervical spine, and maxillofacial structures were performed using the standard protocol without intravenous contrast. Multiplanar CT image reconstructions of the cervical spine and maxillofacial structures were also generated. COMPARISON:  Head and cervical spine CT 09/16/2019 FINDINGS: CT HEAD FINDINGS Brain: Mild stable age related atrophic change. Ventricles and cisterns are otherwise unchanged. No mass, mass effect, shift of midline structures or acute hemorrhage. No evidence of acute infarction. Vascular: No hyperdense vessel or unexpected calcification. Skull: Normal. Negative for fracture or focal lesion. Other: None. CT MAXILLOFACIAL FINDINGS Osseous: No fracture or mandibular dislocation. No destructive process. Orbits: Negative.  No traumatic or inflammatory finding. Sinuses: Hypoplastic frontal sinuses. Paranasal sinuses are otherwise clear. Soft tissues: No acute soft tissue injury. Mild asymmetric prominence of the right tonsillar soft tissues. CT CERVICAL SPINE FINDINGS Alignment: Mild reversal of the normal cervical lordosis. No posttraumatic subluxation. Skull base and vertebrae: Vertebral body heights are normal. There is moderate spondylosis throughout the cervical spine to include facet arthropathy and uncovertebral joint spurring. Significant degenerative changes over the articulation between the occiput of the skull and left lateral mass of C1 unchanged. Minimal prominence of the atlanto dens interval unchanged compatible with known down syndrome. No acute fracture or subluxation. Moderate bilateral neural foraminal narrowing at multiple levels due to adjacent bony spurring. Soft tissues and spinal canal: Mild canal stenosis at several levels of the  cervical spine due to prominent posterior osteophyte formation. Disc levels: Disc space narrowing throughout the cervical spine unchanged. Upper chest: No acute findings. Other: None. IMPRESSION: 1.  No acute brain injury.  Mild stable atrophic change. 2.  No acute facial bone fracture. 3.  No acute cervical spine injury. 4. 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 Mild spinal canal stenosis at several levels predominately due to posterior osteophyte formation. 5. Mild asymmetric soft tissue prominence over the right tonsillar soft tissues. Recommend clinical correlation. Electronically Signed   By: Elberta Fortis M.D.   On: 10/12/2019 09:58   CT Cervical Spine Wo Contrast  Result Date: 10/12/2019 CLINICAL DATA:  Unwitnessed fall on bathroom floor. Neck trauma. History of down syndrome. EXAM: CT HEAD WITHOUT CONTRAST CT MAXILLOFACIAL WITHOUT CONTRAST CT CERVICAL SPINE WITHOUT  CONTRAST TECHNIQUE: Multidetector CT imaging of the head, cervical spine, and maxillofacial structures were performed using the standard protocol without intravenous contrast. Multiplanar CT image reconstructions of the cervical spine and maxillofacial structures were also generated. COMPARISON:  Head and cervical spine CT 09/16/2019 FINDINGS: CT HEAD FINDINGS Brain: Mild stable age related atrophic change. Ventricles and cisterns are otherwise unchanged. No mass, mass effect, shift of midline structures or acute hemorrhage. No evidence of acute infarction. Vascular: No hyperdense vessel or unexpected calcification. Skull: Normal. Negative for fracture or focal lesion. Other: None. CT MAXILLOFACIAL FINDINGS Osseous: No fracture or mandibular dislocation. No destructive process. Orbits: Negative. No traumatic or inflammatory finding. Sinuses: Hypoplastic frontal sinuses. Paranasal sinuses are otherwise clear. Soft tissues: No acute soft tissue injury. Mild asymmetric prominence of the right tonsillar soft tissues. CT CERVICAL SPINE FINDINGS Alignment: Mild reversal of the normal cervical lordosis. No posttraumatic subluxation. Skull base and vertebrae: Vertebral body heights are normal. There is moderate spondylosis throughout the cervical spine to include facet arthropathy and uncovertebral joint spurring. Significant degenerative changes over the articulation between the occiput of the skull and left lateral mass of C1 unchanged. Minimal prominence of the atlanto dens interval unchanged compatible with known down syndrome. No acute fracture or subluxation. Moderate bilateral neural foraminal narrowing at multiple levels due to adjacent bony spurring. Soft tissues and spinal canal: Mild canal stenosis at several levels of the cervical spine due to prominent posterior osteophyte formation. Disc levels: Disc space narrowing throughout the cervical spine unchanged. Upper chest: No acute findings. Other: None.  IMPRESSION: 1.  No acute brain injury.  Mild stable atrophic change. 2.  No acute facial bone fracture. 3.  No acute cervical spine injury. 4. 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 Mild spinal canal stenosis at several levels predominately due to posterior osteophyte formation. 5. Mild asymmetric soft tissue prominence over the right tonsillar soft tissues. Recommend clinical correlation. Electronically Signed   By: Elberta Fortis M.D.   On: 10/12/2019 09:58   CT Maxillofacial Wo Contrast  Result Date: 10/12/2019 CLINICAL DATA:  Unwitnessed fall on bathroom floor. Neck trauma. History of down syndrome. EXAM: CT HEAD WITHOUT CONTRAST CT MAXILLOFACIAL WITHOUT CONTRAST CT CERVICAL SPINE WITHOUT CONTRAST TECHNIQUE: Multidetector CT imaging of the head, cervical spine, and maxillofacial structures were performed using the standard protocol without intravenous contrast. Multiplanar CT image reconstructions of the cervical spine and maxillofacial structures were also generated. COMPARISON:  Head and cervical spine CT 09/16/2019 FINDINGS: CT HEAD FINDINGS Brain: Mild stable age related atrophic change. Ventricles and cisterns are otherwise  unchanged. No mass, mass effect, shift of midline structures or acute hemorrhage. No evidence of acute infarction. Vascular: No hyperdense vessel or unexpected calcification. Skull: Normal. Negative for fracture or focal lesion. Other: None. CT MAXILLOFACIAL FINDINGS Osseous: No fracture or mandibular dislocation. No destructive process. Orbits: Negative. No traumatic or inflammatory finding. Sinuses: Hypoplastic frontal sinuses. Paranasal sinuses are otherwise clear. Soft tissues: No acute soft tissue injury. Mild asymmetric prominence of the right tonsillar soft tissues. CT CERVICAL SPINE FINDINGS Alignment: Mild reversal of the normal cervical lordosis. No posttraumatic  subluxation. Skull base and vertebrae: Vertebral body heights are normal. There is moderate spondylosis throughout the cervical spine to include facet arthropathy and uncovertebral joint spurring. Significant degenerative changes over the articulation between the occiput of the skull and left lateral mass of C1 unchanged. Minimal prominence of the atlanto dens interval unchanged compatible with known down syndrome. No acute fracture or subluxation. Moderate bilateral neural foraminal narrowing at multiple levels due to adjacent bony spurring. Soft tissues and spinal canal: Mild canal stenosis at several levels of the cervical spine due to prominent posterior osteophyte formation. Disc levels: Disc space narrowing throughout the cervical spine unchanged. Upper chest: No acute findings. Other: None. IMPRESSION: 1.  No acute brain injury.  Mild stable atrophic change. 2.  No acute facial bone fracture. 3.  No acute cervical spine injury. 4. 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 Mild spinal canal stenosis at several levels predominately due to posterior osteophyte formation. 5. Mild asymmetric soft tissue prominence over the right tonsillar soft tissues. Recommend clinical correlation. Electronically Signed   By: Elberta Fortis M.D.   On: 10/12/2019 09:58    Procedures Procedures (including critical care time)  Medications Ordered in ED Medications  LORazepam (ATIVAN) injection 1 mg (has no administration in time range)    ED Course  I have reviewed the triage vital signs and the nursing notes.  Pertinent labs & imaging results that were available during my care of the patient were reviewed by me and considered in my medical decision making (see chart for details).    MDM Rules/Calculators/A&P                        Patient appears to have had a mechanical fall today.  Review of records, however  reveals recent admission for possible syncope and at that time his hemoglobin was noted to be low.  No obvious bleeding or rectal bleeding was reported or noted.  Plan to check i-STAT.  Patient with normal istat.  Abrasion noted to tongue.  The tongue abrasion increases probability that this was a seizure.  Difficult to assess postictal versus baseline for this patient, but definitely more cooperative now, although it may be due to father at bedside.  Patient complaining of left knee pain.  Pain is difficult to examine but does not appear to have any definitive point tenderness or deformity.  Patient unable to ambulate and left knee and left hip x-Otha Rickles obtained  1:26 PM X-Tyresha Fede left hip and left knee reviewed and no evidence of acute fracture seen.  Plan attempt to ambulate with assistance   PFinal Clinical Impression(s) / ED Diagnoses Final diagnoses:  None      Durable Medical Equipment  (From admission, onward)         Start     Ordered   10/12/19 1518  For home use only DME lightweight manual  wheelchair with seat cushion  Once    Comments: Patient suffers from decreased mobilit with developmental disability which impairs their ability to perform daily activities like dressing in the home.  A walker will not resolve  issue with performing activities of daily living. A wheelchair will allow patient to safely perform daily activities. Patient is not able to propel themselves in the home using a standard weight wheelchair due to general weakness. Patient can self propel in the lightweight wheelchair. Length of need 6 months . Accessories: elevating leg rests (ELRs), wheel locks, extensions and anti-tippers.   10/12/19 1520          54 year old male with history of Down syndrome presents today after fall versus syncope versus seizure.  EKG with st twave changes but unchanged from prior ekg of January 25 and 25.  Echo at that time showed EF of 60 to 65% and no regional wall abnormalities.   Valves are reported normal.  He has been seen and evaluated for similar symptoms in the past.  He did require Ativan 1 mg IM to allow for CT scans.  He has refused to stand and bear weight.  I have examined his hips, knees, ankles, pelvis, and low back area.  He appeared to have some pain on the left knee and subsequently obtained left knee and left hip x-rays.  However, he continues to refuse to bear weight.  It is difficult to assess this patient with developmental delay due to communication, and socialization issues.  His father is at the bedside.  Father states that his facility is on one level.  Current plan is to obtain a wheelchair for the patient's mobility needs over the weekend.  He can then be reevaluated by his caregivers.  If he continues to refuse to bear weight he may require further evaluation.  However, removing the situational issues may resolve his current problems.  He is eating and sitting up in bed and interactive with interviewer's and his father at this time.  Discharge instructions: Patient with fall today.  Review of recent history with similar episode in last month.  EKG unchanged.  Labs normal.  Patient given ativan in ED for imaging and refuses to stand although no injury definite injury can be found. Left knee seemed to bother him the most at one point and  x-rays obtained, but did not show fracture.  Patient is being given wheelchair for use this weekend and to see if he returns to normal function.  If not, he should be reevaluated here or with his primary care doctor.  Rx / DC Orders   ED Discharge Orders    None       Margarita Grizzle, MD 10/12/19 1536

## 2019-10-12 NOTE — ED Notes (Signed)
Attempted to ambulate pt, but he could only sit on side of bed for a short period. Frowning w/assumed L knee pain, will not extend it fully, even with assistance.

## 2019-10-12 NOTE — Discharge Instructions (Addendum)
Patient with fall today.  Review of recent history with similar episode in last month.  EKG unchanged.  Labs normal.  Patient given ativan in ED for imaging and refuses to stand although no injury definite injury can be found. Left knee seemed to bother him the most at one point and  x-rays obtained, but did not show fracture.  Patient is being given wheelchair for use this weekend and to see if he returns to normal function.  If not, he should be reevaluated here or with his primary care doctor.

## 2019-10-12 NOTE — ED Notes (Signed)
Bloodwork completed. Extra lavender and light green in mini lab

## 2019-10-12 NOTE — ED Notes (Signed)
Attempted for 2nd time to ambulate pt, but he was unable to sit on side of bed. He can extend L leg now

## 2019-10-12 NOTE — Care Management (Signed)
ED CM spoke with Dr. Rosalia Hammers concerning issues with ambulation with recommendation for lightweight w/c. ED CM contacted  Fayrene Fearing with [employer] to arrange delivery of w/c prior to discharge from the ED. Updated Dr. Rosalia Hammers and Marisue Ivan RN.

## 2019-10-24 ENCOUNTER — Encounter (HOSPITAL_COMMUNITY): Payer: Self-pay | Admitting: Cardiology

## 2019-11-04 ENCOUNTER — Telehealth (HOSPITAL_COMMUNITY): Payer: Self-pay | Admitting: Cardiology

## 2019-11-04 NOTE — Telephone Encounter (Signed)
Just an FYI. We have made several attempts to contact this patient including sending a letter to schedule or reschedule their echocardiogram. We will be removing the patient from the echo WQ.   Thank you      10/21/19 [facility] @ 3:29/[facility] Letter will be mailed.  10/16/19- [facility] 2:54pm [facility]  2.15.21 No show  2.3.21 Patient cancelled

## 2019-11-29 ENCOUNTER — Other Ambulatory Visit: Payer: Self-pay

## 2019-11-29 ENCOUNTER — Inpatient Hospital Stay (HOSPITAL_COMMUNITY)
Admission: EM | Admit: 2019-11-29 | Discharge: 2019-12-06 | DRG: 068 | Disposition: A | Discharge status: Skilled Nursing Facility | Payer: Medicare Other | Attending: Internal Medicine | Admitting: Internal Medicine

## 2019-11-29 ENCOUNTER — Observation Stay (HOSPITAL_COMMUNITY): Payer: Medicare Other

## 2019-11-29 ENCOUNTER — Encounter (HOSPITAL_COMMUNITY): Payer: Self-pay

## 2019-11-29 ENCOUNTER — Emergency Department (HOSPITAL_COMMUNITY): Payer: Medicare Other

## 2019-11-29 DIAGNOSIS — I6521 Occlusion and stenosis of right carotid artery: Secondary | ICD-10-CM | POA: Diagnosis not present

## 2019-11-29 DIAGNOSIS — F79 Unspecified intellectual disabilities: Secondary | ICD-10-CM | POA: Diagnosis present

## 2019-11-29 DIAGNOSIS — Z880 Allergy status to penicillin: Secondary | ICD-10-CM

## 2019-11-29 DIAGNOSIS — Z79899 Other long term (current) drug therapy: Secondary | ICD-10-CM

## 2019-11-29 DIAGNOSIS — I739 Peripheral vascular disease, unspecified: Secondary | ICD-10-CM | POA: Diagnosis present

## 2019-11-29 DIAGNOSIS — E785 Hyperlipidemia, unspecified: Secondary | ICD-10-CM | POA: Diagnosis present

## 2019-11-29 DIAGNOSIS — E538 Deficiency of other specified B group vitamins: Secondary | ICD-10-CM | POA: Diagnosis present

## 2019-11-29 DIAGNOSIS — Q909 Down syndrome, unspecified: Secondary | ICD-10-CM

## 2019-11-29 DIAGNOSIS — F688 Other specified disorders of adult personality and behavior: Secondary | ICD-10-CM | POA: Diagnosis present

## 2019-11-29 DIAGNOSIS — Z20822 Contact with and (suspected) exposure to covid-19: Secondary | ICD-10-CM | POA: Diagnosis present

## 2019-11-29 DIAGNOSIS — R55 Syncope and collapse: Secondary | ICD-10-CM

## 2019-11-29 DIAGNOSIS — R569 Unspecified convulsions: Secondary | ICD-10-CM | POA: Diagnosis present

## 2019-11-29 DIAGNOSIS — Z66 Do not resuscitate: Secondary | ICD-10-CM | POA: Diagnosis present

## 2019-11-29 DIAGNOSIS — I10 Essential (primary) hypertension: Secondary | ICD-10-CM | POA: Diagnosis present

## 2019-11-29 DIAGNOSIS — D509 Iron deficiency anemia, unspecified: Secondary | ICD-10-CM | POA: Diagnosis present

## 2019-11-29 DIAGNOSIS — Z791 Long term (current) use of non-steroidal anti-inflammatories (NSAID): Secondary | ICD-10-CM

## 2019-11-29 HISTORY — DX: Unspecified convulsions: R56.9

## 2019-11-29 LAB — CBC WITH DIFFERENTIAL/PLATELET
Abs Immature Granulocytes: 0.11 10*3/uL — ABNORMAL HIGH (ref 0.00–0.07)
Basophils Absolute: 0.1 10*3/uL (ref 0.0–0.1)
Basophils Relative: 1 %
Eosinophils Absolute: 0 10*3/uL (ref 0.0–0.5)
Eosinophils Relative: 0 %
HCT: 40.8 % (ref 39.0–52.0)
Hemoglobin: 12.6 g/dL — ABNORMAL LOW (ref 13.0–17.0)
Immature Granulocytes: 1 %
Lymphocytes Relative: 6 %
Lymphs Abs: 0.5 10*3/uL — ABNORMAL LOW (ref 0.7–4.0)
MCH: 31.2 pg (ref 26.0–34.0)
MCHC: 30.9 g/dL (ref 30.0–36.0)
MCV: 101 fL — ABNORMAL HIGH (ref 80.0–100.0)
Monocytes Absolute: 0.3 10*3/uL (ref 0.1–1.0)
Monocytes Relative: 4 %
Neutro Abs: 6.8 10*3/uL (ref 1.7–7.7)
Neutrophils Relative %: 88 %
Platelets: 255 10*3/uL (ref 150–400)
RBC: 4.04 MIL/uL — ABNORMAL LOW (ref 4.22–5.81)
RDW: 14.9 % (ref 11.5–15.5)
WBC: 7.9 10*3/uL (ref 4.0–10.5)
nRBC: 0 % (ref 0.0–0.2)

## 2019-11-29 LAB — BASIC METABOLIC PANEL
Anion gap: 10 (ref 5–15)
BUN: 15 mg/dL (ref 6–20)
CO2: 28 mmol/L (ref 22–32)
Calcium: 8.9 mg/dL (ref 8.9–10.3)
Chloride: 102 mmol/L (ref 98–111)
Creatinine, Ser: 1.14 mg/dL (ref 0.61–1.24)
GFR calc Af Amer: >60 mL/min (ref >60)
GFR calc non Af Amer: >60 mL/min (ref >60)
Glucose, Bld: 103 mg/dL — ABNORMAL HIGH (ref 70–99)
Potassium: 4.6 mmol/L (ref 3.5–5.1)
Sodium: 140 mmol/L (ref 135–145)

## 2019-11-29 LAB — TROPONIN I (HIGH SENSITIVITY)
Troponin I (High Sensitivity): 31 ng/L — ABNORMAL HIGH (ref <18)
Troponin I (High Sensitivity): 27 ng/L — ABNORMAL HIGH (ref <18)

## 2019-11-29 LAB — TSH: TSH: 1.821 u[IU]/mL (ref 0.350–4.500)

## 2019-11-29 LAB — SARS CORONAVIRUS 2 (TAT 6-24 HRS): SARS Coronavirus 2: NEGATIVE

## 2019-11-29 MED ORDER — ACETAMINOPHEN 650 MG RE SUPP: 650.0000 mg | Freq: Four times a day (QID) | RECTAL | Status: DC | PRN

## 2019-11-29 MED ORDER — SUCRALFATE 1 G PO TABS
1.0000 g | ORAL_TABLET | Freq: Two times a day (BID) | ORAL | Status: DC
  Administered 2019-11-30 – 2019-12-06 (×11): 1 g via ORAL
  Filled 2019-11-29 (×12): qty 1

## 2019-11-29 MED ORDER — ENOXAPARIN SODIUM 40 MG/0.4ML ~~LOC~~ SOLN
40.0000 mg | SUBCUTANEOUS | Status: DC
  Administered 2019-12-03 – 2019-12-05 (×3): 40 mg via SUBCUTANEOUS
  Filled 2019-11-29 (×5): qty 0.4

## 2019-11-29 MED ORDER — ONDANSETRON HCL 4 MG PO TABS: 4.0000 mg | ORAL_TABLET | Freq: Four times a day (QID) | ORAL | Status: DC | PRN

## 2019-11-29 MED ORDER — ACETAMINOPHEN 325 MG PO TABS
650.0000 mg | ORAL_TABLET | Freq: Four times a day (QID) | ORAL | Status: DC | PRN
  Administered 2019-12-01 – 2019-12-03 (×2): 650 mg via ORAL
  Filled 2019-11-29 (×2): qty 2

## 2019-11-29 MED ORDER — DIAZEPAM 5 MG PO TABS
5.0000 mg | ORAL_TABLET | Freq: Once | ORAL | Status: AC
  Administered 2019-11-29: 10:00:00 5 mg via ORAL
  Filled 2019-11-29: qty 1

## 2019-11-29 MED ORDER — LACTATED RINGERS IV SOLN: INTRAVENOUS | Status: DC

## 2019-11-29 MED ORDER — ONDANSETRON HCL 4 MG/2ML IJ SOLN: 4.0000 mg | Freq: Four times a day (QID) | INTRAMUSCULAR | Status: DC | PRN

## 2019-11-29 MED ORDER — LOPERAMIDE HCL 2 MG PO CAPS: 2.0000 mg | ORAL_CAPSULE | Freq: Two times a day (BID) | ORAL | Status: DC | PRN

## 2019-11-29 MED ORDER — DICLOFENAC SODIUM 1 % EX GEL
2.0000 g | Freq: Four times a day (QID) | CUTANEOUS | Status: DC
  Administered 2019-11-29 – 2019-12-06 (×25): 2 g via TOPICAL
  Filled 2019-11-29: qty 100

## 2019-11-29 MED ORDER — ADULT MULTIVITAMIN W/MINERALS CH
1.0000 | ORAL_TABLET | Freq: Every day | ORAL | Status: DC
  Administered 2019-11-30 – 2019-12-06 (×5): 1 via ORAL
  Filled 2019-11-29 (×6): qty 1

## 2019-11-29 MED ORDER — SODIUM CHLORIDE 0.9% FLUSH
3.0000 mL | Freq: Two times a day (BID) | INTRAVENOUS | Status: DC
  Administered 2019-11-29 – 2019-12-06 (×8): 3 mL via INTRAVENOUS

## 2019-11-29 MED ORDER — CELECOXIB 100 MG PO CAPS
100.0000 mg | ORAL_CAPSULE | Freq: Every day | ORAL | Status: DC
  Administered 2019-12-02 – 2019-12-06 (×4): 100 mg via ORAL
  Filled 2019-11-29 (×8): qty 1

## 2019-11-29 MED ORDER — PRAVASTATIN SODIUM 40 MG PO TABS
40.0000 mg | ORAL_TABLET | Freq: Every day | ORAL | Status: DC
  Administered 2019-11-29 – 2019-12-06 (×7): 40 mg via ORAL
  Filled 2019-11-29 (×7): qty 1

## 2019-11-29 NOTE — ED Notes (Signed)
EEG at bedside.

## 2019-11-29 NOTE — ED Notes (Signed)
Tracy Todd [phone]

## 2019-11-29 NOTE — Progress Notes (Signed)
EEG completed, results pending. 

## 2019-11-29 NOTE — Plan of Care (Signed)

## 2019-11-29 NOTE — ED Provider Notes (Signed)
[facility] EMERGENCY DEPARTMENT Provider Note   CSN: [id number] Arrival date & time: 11/29/19  3762     History Chief Complaint  Patient presents with  . Loss of Consciousness  . Seizures    Tracy Todd is a 54 y.o. male.  HPI Level 5 caveat due to Down syndrome and minimally verbal status. Brought in reportedly after syncopal episode while at group home.  Reportedly hit the floor and then had seizure-like activity.  History does state seizure history but discussed with patient's father states he has not had seizures before.  Has had recent episodes of syncope however.  Has been seen by cardiology and admitted to the hospital.  Also seen GI.  Has had an anemia.  Patient cannot really tell me what happened this time    Past Medical History:  Diagnosis Date  . Down's syndrome         Patient Active Problem List   Diagnosis Date Noted  . Iron deficiency anemia 09/17/2019  . Elevated troponin 09/17/2019  . Syncope 09/16/2019  . Down syndrome 12/27/2012  . Arthritis 10/25/2011  . Hyperlipidemia 10/25/2011    History reviewed. No pertinent surgical history.     No family history on file.  Social History   Tobacco Use  . Smoking status: Never Smoker  . Smokeless tobacco: Never Used  Substance Use Topics  . Alcohol use: Never  . Drug use: Never    Home Medications Prior to Admission medications   Medication Sig Start Date End Date Taking? Authorizing Provider  celecoxib (CELEBREX) 100 MG capsule Take 100 mg by mouth daily.   Yes [provider]  ciclopirox (LOPROX) 0.77 % SUSP Apply 1 application topically 2 (two) times daily as needed (Dry face and scalp).  11/25/19  Yes [provider]  diazepam (VALIUM) 5 MG tablet Take 5 mg by mouth See admin instructions. Take 1 tablet 30 minutes prior to procedure, not to exceed 2 tablets.   Yes [provider]  diclofenac Sodium (VOLTAREN) 1 % GEL Apply 1 g topically 4 (four) times  daily. To both knees   Yes [provider]  FIBER ADULT GUMMIES PO Take 2 tablets by mouth daily.   Yes [provider]  iron polysaccharides (NU-IRON) 150 MG capsule Take 1 capsule (150 mg total) by mouth daily. 09/17/19  Yes Ladona Horns, MD  ketoconazole (NIZORAL) 2 % shampoo Apply 1 application topically See admin instructions. Use to wash hair and face, alternate by week. 08/19/19  Yes [provider]  loperamide (IMODIUM A-D) 2 MG tablet Take 2 mg by mouth 2 (two) times daily.    Yes [provider]  miconazole (MICATIN) 2 % cream Apply 1 application topically 2 (two) times daily. Apply to feet   Yes [provider]  Multiple Vitamins-Minerals (MULTIVITAMIN WITH MINERALS) tablet Take 1 tablet by mouth daily.   Yes [provider]  nystatin cream (MYCOSTATIN) Apply 1 application topically 3 (three) times daily as needed for dry skin. 01/21/19  Yes [provider]  pravastatin (PRAVACHOL) 40 MG tablet Take 40 mg by mouth daily. 10/25/11  Yes [provider]  selenium sulfide (SELSUN) 1 % LOTN Apply 1 application topically See admin instructions. Use to wash hair and face alternate by week.   Yes [provider]  Soap & Cleansers (CERAVE HYDRATING CLEANSER) LIQD Apply 1 application topically daily. Use with cool showers   Yes [provider]  sucralfate (CARAFATE) 1 g tablet  Take 1 g by mouth 2 (two) times daily. 01/03/19  Yes [provider]  vitamin B-12 (CYANOCOBALAMIN) 500 MCG tablet Take 1 tablet (500 mcg total) by mouth daily. 09/17/19  Yes Thom Chimes, MD    Allergies    Penicillins  Review of Systems   Review of Systems  Unable to perform ROS: Patient nonverbal    Physical Exam Updated Vital Signs BP 117/84   Pulse 61   Resp 15   SpO2 100%   Physical Exam Vitals reviewed.  Constitutional:      Appearance: Normal appearance.  HENT:     Head: Atraumatic.  Eyes:     Pupils: Pupils  are equal, round, and reactive to light.  Neck:     Comments: Mild tenderness on cervical spine. Cardiovascular:     Rate and Rhythm: Regular rhythm.  Pulmonary:     Breath sounds: No wheezing or rhonchi.  Abdominal:     Tenderness: There is no abdominal tenderness.  Musculoskeletal:        General: No deformity or signs of injury.  Skin:    General: Skin is warm.  Neurological:     Mental Status: He is alert. Mental status is at baseline.     ED Results / Procedures / Treatments   Labs (all labs ordered are listed, but only abnormal results are displayed) Labs Reviewed  BASIC METABOLIC PANEL - Abnormal; Notable for the following components:      Result Value   Glucose, Bld 103 (*)    All other components within normal limits  CBC WITH DIFFERENTIAL/PLATELET - Abnormal; Notable for the following components:   RBC 4.04 (*)    Hemoglobin 12.6 (*)    MCV 101.0 (*)    Lymphs Abs 0.5 (*)    Abs Immature Granulocytes 0.11 (*)    All other components within normal limits  TROPONIN I (HIGH SENSITIVITY) - Abnormal; Notable for the following components:   Troponin I (High Sensitivity) 31 (*)    All other components within normal limits  TROPONIN I (HIGH SENSITIVITY)    EKG EKG Interpretation  Date/Time:  Friday November 29 2019 10:49:25 EDT Ventricular Rate:  57 PR Interval:    QRS Duration: 90 QT Interval:  481 QTC Calculation: 469 R Axis:   60 Text Interpretation: Sinus rhythm Borderline T abnormalities, inferior leads Confirmed by Benjiman Core [id number]) on 11/29/2019 1:11:00 PM   Radiology CT Head Wo Contrast  Result Date: 11/29/2019 CLINICAL DATA:  Seizure, head injury after fall. EXAM: CT HEAD WITHOUT CONTRAST CT CERVICAL SPINE WITHOUT CONTRAST TECHNIQUE: Multidetector CT imaging of the head and cervical spine was performed following the standard protocol without intravenous contrast. Multiplanar CT image reconstructions of the cervical spine were also generated.  COMPARISON:  October 12, 2019. FINDINGS: CT HEAD FINDINGS Brain: Mild diffuse cortical atrophy is noted. No mass effect or midline shift is noted. Ventricular size is within normal limits. There is no evidence of mass lesion, hemorrhage or acute infarction. Vascular: No hyperdense vessel or unexpected calcification. Skull: Normal. Negative for fracture or focal lesion. Sinuses/Orbits: No acute finding. Other: None. CT CERVICAL SPINE FINDINGS Alignment: Mild grade 1 retrolisthesis of C3-4 is noted secondary to moderate degenerative disc disease at this level. Skull base and vertebrae: No acute fracture. No primary bone lesion or focal pathologic process. Soft tissues and spinal canal: No prevertebral fluid or swelling. No visible canal hematoma. Disc levels: Moderate degenerative disc disease is also noted at C4-5, C5-6 and C6-7 with  anterior posterior osteophyte formation. Upper chest: Negative. Other: None. IMPRESSION: Mild diffuse cortical atrophy. No acute intracranial abnormality seen. Moderate multilevel degenerative disc disease. No acute abnormality seen in the cervical spine Electronically Signed   By: Lupita Raider M.D.   On: 11/29/2019 10:23   CT Cervical Spine Wo Contrast  Result Date: 11/29/2019 CLINICAL DATA:  Seizure, head injury after fall. EXAM: CT HEAD WITHOUT CONTRAST CT CERVICAL SPINE WITHOUT CONTRAST TECHNIQUE: Multidetector CT imaging of the head and cervical spine was performed following the standard protocol without intravenous contrast. Multiplanar CT image reconstructions of the cervical spine were also generated. COMPARISON:  October 12, 2019. FINDINGS: CT HEAD FINDINGS Brain: Mild diffuse cortical atrophy is noted. No mass effect or midline shift is noted. Ventricular size is within normal limits. There is no evidence of mass lesion, hemorrhage or acute infarction. Vascular: No hyperdense vessel or unexpected calcification. Skull: Normal. Negative for fracture or focal lesion.  Sinuses/Orbits: No acute finding. Other: None. CT CERVICAL SPINE FINDINGS Alignment: Mild grade 1 retrolisthesis of C3-4 is noted secondary to moderate degenerative disc disease at this level. Skull base and vertebrae: No acute fracture. No primary bone lesion or focal pathologic process. Soft tissues and spinal canal: No prevertebral fluid or swelling. No visible canal hematoma. Disc levels: Moderate degenerative disc disease is also noted at C4-5, C5-6 and C6-7 with anterior posterior osteophyte formation. Upper chest: Negative. Other: None. IMPRESSION: Mild diffuse cortical atrophy. No acute intracranial abnormality seen. Moderate multilevel degenerative disc disease. No acute abnormality seen in the cervical spine Electronically Signed   By: Lupita Raider M.D.   On: 11/29/2019 10:23    Procedures Procedures (including critical care time)  Medications Ordered in ED Medications  diazepam (VALIUM) tablet 5 mg (5 mg Oral Given 11/29/19 3762)    ED Course  I have reviewed the triage vital signs and the nursing notes.  Pertinent labs & imaging results that were available during my care of the patient were reviewed by me and considered in my medical decision making (see chart for details).    MDM Rules/Calculators/A&P                      Patient with syncope.  Reportedly was at breakfast table fell and had some potential seizure activity.  Head CT and cervical spine CT reassuring.  Has had recent syncopal episodes and had some work-up, however it appears that outpatient work-up was undergone a stress test, which they were somewhat worried about his ability to do and had not had done yet.  With new episode today EKG is mildly changed.  Troponin is mildly elevated but less elevated than was with recent visit.  Has seen GI previously.  With recurrent syncope I feels patient benefit from admission to the hospital.  Will discuss with unassigned medicine. Final Clinical Impression(s) / ED Diagnoses  Final diagnoses:  Syncope, unspecified syncope type  Down syndrome    Rx / DC Orders ED Discharge Orders    None       Benjiman Core, MD 11/29/19 1326

## 2019-11-29 NOTE — Procedures (Signed)
Patient Name: Tracy Todd  MRN: [id number]  Epilepsy Attending: Charlsie Quest  Referring Physician/Provider: Dr. Jonah Blue Date: 11/29/2019 Duration: 25.25 mins  Patient history: 54 year old male with multiple syncopal episodes.  EEG evaluate for seizures.  Level of alertness: Awake  AEDs during EEG study: None  Technical aspects: This EEG study was done with scalp electrodes positioned according to the 10-20 International system of electrode placement. Electrical activity was acquired at a sampling rate of 500Hz  and reviewed with a high frequency filter of 70Hz  and a low frequency filter of 1Hz . EEG data were recorded continuously and digitally stored.   Description: During awake state, no clear posterior dominant rhythm was seen.  EEG showed continuous generalized, maximal bifrontal 3 to 6 Hz theta-delta slowing.  Hyperventilation and photic stimulation were not performed.  Abnormality -Continuous slow, generalized  IMPRESSION: This study is suggestive of moderate diffuse encephalopathy, nonspecific to etiology. No seizures or epileptiform discharges were seen throughout the recording.    Tracy Kinner 

## 2019-11-29 NOTE — Progress Notes (Signed)
Met Mr. Sabra Heck in the hallway outside ED where we struck up a conversation.  Mr. Sabra Heck said his son, Alvy was "back there."  Mr. Sabra Heck went on to speak of Gregroy who has Downs Syndrome, who has been here before with the same symptoms and how worried he is they can't find out what is wrong with his son.  Chaplain affirmed this concern as being normal for a father who cares deeply for his child. Then Mr. Sabra Heck said he'd tell me something else..his wife died in 08/14/23 after 7 years of marriage.  Chaplain and Mr. Sabra Heck prayed together right there in the hallway.  Chaplain escorted Mr. Sabra Heck to the exit.  De Burrs Chaplain Resident

## 2019-11-29 NOTE — ED Notes (Signed)
Pt transported to CT ?

## 2019-11-29 NOTE — ED Triage Notes (Signed)
Pt from group home with ems for syncope while at breakfast table, pt hit head on floor and had seizure like activity. Pt has hx of seizures and down syndrome. Pt nonverbal at baseline and is easily agitated. No other injuries noted. VSS

## 2019-11-29 NOTE — ED Notes (Signed)
Pt given teddy grahams. 

## 2019-11-29 NOTE — ED Notes (Signed)
Pt would not stand long enough for orthostatic vitals for standing part

## 2019-11-29 NOTE — ED Notes (Signed)
Phoned report to receiving RN,. Arranging transport at this time. Pt in stable condition with all belongings.

## 2019-11-29 NOTE — H&P (Addendum)
History and Physical    Tracy Todd TDV:[id number] DOB: 1966-02-24 DOA: 11/29/2019  PCP: Chesley Noon, MD Consultants:  Geanie Berlin - cardiology; Philippa Chester - GI Patient coming from: Group home;  NOK: Father. [phone]  Chief Complaint: syncope  HPI: Tracy Todd is a 54 y.o. male with medical history significant of Down syndrome and ?seizures presenting with syncope.  He stays at a group home and his father wasn't there.  They told his dad that he passed out, possibly in the bathroom.  This is maybe the third time.  He has had tests in the past without a clear answer.  They thought maybe it was due to anemia or a GI problem.  GI talked about a c-scope; they talked a Cologuard first and that hasn't happened.  He has not had seizures and they have no considered this.  His father thinks this all happened after his second COVID vaccine.  The staff at his group home has not noticed him to be his usual happy self in the last few months.  All 3 episodes have happened in the bathroom - orthostasis is a concern.      ED Course:  Syncope - eating, fell, hit head - ?seizure activity.  Prior episode in January, admitted for syncope.    Review of Systems: unable to perform  Ambulatory Status:  Ambulates without assistance  COVID Vaccine Status:   Complete maybe 3-4 weeks ago  Past Medical History:  Diagnosis Date  . Down's syndrome   . Seizures ([place])     History reviewed. No pertinent surgical history.  Social History   Socioeconomic History  . Marital status: Single    Spouse name: Not on file  . Number of children: Not on file  . Years of education: Not on file  . Highest education level: Not on file  Occupational History  . Not on file  Tobacco Use  . Smoking status: Never Smoker  . Smokeless tobacco: Never Used  Substance and Sexual Activity  . Alcohol use: Never  . Drug use: Never  . Sexual activity: Not on file  Other Topics Concern  . Not on file  Social History  Narrative  . Not on file   Social Determinants of Health   Financial Resource Strain:   . Difficulty of Paying Living Expenses:   Food Insecurity:   . Worried About Charity fundraiser in the Last Year:   . Arboriculturist in the Last Year:   Transportation Needs:   . Film/video editor (Medical):   Marland Kitchen Lack of Transportation (Non-Medical):   Physical Activity:   . Days of Exercise per Week:   . Minutes of Exercise per Session:   Stress:   . Feeling of Stress :   Social Connections:   . Frequency of Communication with Friends and Family:   . Frequency of Social Gatherings with Friends and Family:   . Attends Religious Services:   . Active Member of Clubs or Organizations:   . Attends Archivist Meetings:   Marland Kitchen Marital Status:   Intimate Partner Violence:   . Fear of Current or Ex-Partner:   . Emotionally Abused:   Marland Kitchen Physically Abused:   . Sexually Abused:     Allergies  Allergen Reactions  . Penicillins     unknown    No family history on file.  Prior to Admission medications   Medication Sig Start Date End Date Taking? Authorizing Provider  celecoxib (CELEBREX) 100 MG  capsule Take 100 mg by mouth daily.   Yes [provider]  ciclopirox (LOPROX) 0.77 % SUSP Apply 1 application topically 2 (two) times daily as needed (Dry face and scalp).  11/25/19  Yes [provider]  diazepam (VALIUM) 5 MG tablet Take 5 mg by mouth See admin instructions. Take 1 tablet 30 minutes prior to procedure, not to exceed 2 tablets.   Yes [provider]  diclofenac Sodium (VOLTAREN) 1 % GEL Apply 1 g topically 4 (four) times daily. To both knees   Yes [provider]  FIBER ADULT GUMMIES PO Take 2 tablets by mouth daily.   Yes [provider]  iron polysaccharides (NU-IRON) 150 MG capsule Take 1 capsule (150 mg total) by mouth daily. 09/17/19  Yes Thom Chimes, MD  ketoconazole (NIZORAL) 2 % shampoo Apply 1 application topically See admin  instructions. Use to wash hair and face, alternate by week. 08/19/19  Yes [provider]  loperamide (IMODIUM A-D) 2 MG tablet Take 2 mg by mouth 2 (two) times daily.    Yes [provider]  miconazole (MICATIN) 2 % cream Apply 1 application topically 2 (two) times daily. Apply to feet   Yes [provider]  Multiple Vitamins-Minerals (MULTIVITAMIN WITH MINERALS) tablet Take 1 tablet by mouth daily.   Yes [provider]  nystatin cream (MYCOSTATIN) Apply 1 application topically 3 (three) times daily as needed for dry skin. 01/21/19  Yes [provider]  pravastatin (PRAVACHOL) 40 MG tablet Take 40 mg by mouth daily. 10/25/11  Yes [provider]  selenium sulfide (SELSUN) 1 % LOTN Apply 1 application topically See admin instructions. Use to wash hair and face alternate by week.   Yes [provider]  Soap & Cleansers (CERAVE HYDRATING CLEANSER) LIQD Apply 1 application topically daily. Use with cool showers   Yes [provider]  sucralfate (CARAFATE) 1 g tablet Take 1 g by mouth 2 (two) times daily. 01/03/19  Yes [provider]  vitamin B-12 (CYANOCOBALAMIN) 500 MCG tablet Take 1 tablet (500 mcg total) by mouth daily. 09/17/19  Yes Thom Chimes, MD    Physical Exam: Vitals:   11/29/19 1330 11/29/19 1400 11/29/19 1430 11/29/19 1745  BP: 106/64 116/81 120/72   Pulse: (!) 54 64 (!) 58 65  Resp: 15 15 14 16   SpO2: 98% 100% 99% 100%     . General:  Appears calm and comfortable and is NAD; typical appearance c/w Down syndrome . Eyes:  PERRL, EOMI, normal lids, iris . ENT:  grossly normal hearing, lips & tongue, mmm . Neck:  no LAD, masses or thyromegaly . Cardiovascular:  RRR, no m/r/g. No LE edema.  Respiratory:   CTA bilaterally with no wheezes/rales/rhonchi.  Normal respiratory effort. . Abdomen:  soft, NT, ND, NABS . Skin:  no rash or induration seen on limited exam . Musculoskeletal:  grossly normal tone  BUE/BLE, good ROM, no bony abnormality . Psychiatric:  pleasant mood and affect, speech sparse . Neurologic:  Unable to perform    Radiological Exams on Admission: CT Head Wo Contrast  Result Date: 11/29/2019 CLINICAL DATA:  Seizure, head injury after fall. EXAM: CT HEAD WITHOUT CONTRAST CT CERVICAL SPINE WITHOUT CONTRAST TECHNIQUE: Multidetector CT imaging of the head and cervical spine was performed following the standard protocol without intravenous contrast. Multiplanar CT image reconstructions of the cervical spine were also generated. COMPARISON:  October 12, 2019. FINDINGS: CT HEAD FINDINGS Brain: Mild diffuse cortical atrophy is  noted. No mass effect or midline shift is noted. Ventricular size is within normal limits. There is no evidence of mass lesion, hemorrhage or acute infarction. Vascular: No hyperdense vessel or unexpected calcification. Skull: Normal. Negative for fracture or focal lesion. Sinuses/Orbits: No acute finding. Other: None. CT CERVICAL SPINE FINDINGS Alignment: Mild grade 1 retrolisthesis of C3-4 is noted secondary to moderate degenerative disc disease at this level. Skull base and vertebrae: No acute fracture. No primary bone lesion or focal pathologic process. Soft tissues and spinal canal: No prevertebral fluid or swelling. No visible canal hematoma. Disc levels: Moderate degenerative disc disease is also noted at C4-5, C5-6 and C6-7 with anterior posterior osteophyte formation. Upper chest: Negative. Other: None. IMPRESSION: Mild diffuse cortical atrophy. No acute intracranial abnormality seen. Moderate multilevel degenerative disc disease. No acute abnormality seen in the cervical spine Electronically Signed   By: Lupita Raider M.D.   On: 11/29/2019 10:23   CT Cervical Spine Wo Contrast  Result Date: 11/29/2019 CLINICAL DATA:  Seizure, head injury after fall. EXAM: CT HEAD WITHOUT CONTRAST CT CERVICAL SPINE WITHOUT CONTRAST TECHNIQUE: Multidetector CT imaging of the  head and cervical spine was performed following the standard protocol without intravenous contrast. Multiplanar CT image reconstructions of the cervical spine were also generated. COMPARISON:  October 12, 2019. FINDINGS: CT HEAD FINDINGS Brain: Mild diffuse cortical atrophy is noted. No mass effect or midline shift is noted. Ventricular size is within normal limits. There is no evidence of mass lesion, hemorrhage or acute infarction. Vascular: No hyperdense vessel or unexpected calcification. Skull: Normal. Negative for fracture or focal lesion. Sinuses/Orbits: No acute finding. Other: None. CT CERVICAL SPINE FINDINGS Alignment: Mild grade 1 retrolisthesis of C3-4 is noted secondary to moderate degenerative disc disease at this level. Skull base and vertebrae: No acute fracture. No primary bone lesion or focal pathologic process. Soft tissues and spinal canal: No prevertebral fluid or swelling. No visible canal hematoma. Disc levels: Moderate degenerative disc disease is also noted at C4-5, C5-6 and C6-7 with anterior posterior osteophyte formation. Upper chest: Negative. Other: None. IMPRESSION: Mild diffuse cortical atrophy. No acute intracranial abnormality seen. Moderate multilevel degenerative disc disease. No acute abnormality seen in the cervical spine Electronically Signed   By: Lupita Raider M.D.   On: 11/29/2019 10:23   EEG adult  Result Date: 11/29/2019 Charlsie Quest, MD     11/29/2019  4:49 PM Patient Name: Maron Stanzione MRN: [id number] Epilepsy Attending: Charlsie Quest Referring Physician/Provider: Dr. Jonah Blue Date: 11/29/2019 Duration: 25.25 mins Patient history: 54 year old male with multiple syncopal episodes.  EEG evaluate for seizures. Level of alertness: Awake AEDs during EEG study: None Technical aspects: This EEG study was done with scalp electrodes positioned according to the 10-20 International system of electrode placement. Electrical activity was acquired at a sampling rate of  500Hz  and reviewed with a high frequency filter of 70Hz  and a low frequency filter of 1Hz . EEG data were recorded continuously and digitally stored. Description: During awake state, no clear posterior dominant rhythm was seen.  EEG showed continuous generalized, maximal bifrontal 3 to 6 Hz theta-delta slowing.  Hyperventilation and photic stimulation were not performed. Abnormality -Continuous slow, generalized IMPRESSION: This study is suggestive of moderate diffuse encephalopathy, nonspecific to etiology. No seizures or epileptiform discharges were seen throughout the recording. Priyanka    EKG: Independently reviewed.  NSR with rate 57; nonspecific ST changes with no evidence of acute ischemia   Labs on Admission: I  have personally reviewed the available labs and imaging studies at the time of the admission.  Pertinent labs:   HS troponin 31, 27 - markedly improved from 1/25 WBC 7.9 Hgb 12.6   Assessment/Plan Principal Problem:   Syncope Active Problems:   Down syndrome   Hyperlipidemia   Syncope -Etiology is not clear. The differential diagnosis is broad, including vasovagal syncope, seizure, TIA/stroke, arrhythmia, ACS (less likely, given no chest pain), orthostatic status, carotid artery stenosis -By the [facility] syncope rule, the patient is at moderate/high risk for serious outcome and thus should be observed in the hospital. -Will monitor on telemetry -Orthostatic vital signs in AM -Troponin is elevated but less than during prior similar admission; unlikely ACS -Will order carotid doppler  -Consider MRI-brain (although MRI may be challenging given his cognitive impairment) -2d echo performed with prior event so may not be beneficial to repeat now -EEG ordered and Dr. Melynda Ripple reviewed - No epileptiform discharges or seizures appreciated, low suspicion for epilepsy.  This does not rule out epilepsy but makes it less likely. -Consider holter monitor on d/c -Neuro  checks  -PT/OT eval and treat -Given that this is his 3rd episode since January, additional evaluation does appear to be indicated.  Down syndrome -Patient with chronic intellectual disability -He may have had recent personality change -neurology consulted- discussed by telephone, consider outpatient f/u  HLD -Continue Pravachol   Note: This patient has been tested and is pending for the novel coronavirus COVID-19.  DVT prophylaxis:  Lovenox  Code Status:  DNR - confirmed with family Family Communication: His father was present throughout the evaluation Disposition Plan: He is anticipated to d/c to group home without [facility] services once his syncope issues have been resolved. Consults called: epileptologist - to read EEG only; Neurology by telephone Admission status: It is my clinical opinion that referral for OBSERVATION is reasonable and necessary in this patient based on the above information provided. The aforementioned taken together are felt to place the patient at high risk for further clinical deterioration. However it is anticipated that the patient may be medically stable for discharge from the hospital within 24 to 48 hours.    Jonah Blue MD [facility]   How to contact the [facility] Attending or Consulting provider 7A - 7P or covering provider during after hours 7P -7A, for this patient?  1. Check the care team in [facility] and look for a) attending/consulting [facility] provider listed and b) the [facility] team listed 2. Log into [email or web address] and use [employer]'s universal password to access. If you do not have the password, please contact the hospital operator. 3. Locate the [facility] provider you are looking for under [facility] and page to a number that you can be directly reached. 4. If you still have difficulty reaching the provider, please page the [facility] (Director on Call) for the Hospitalists listed on amion for assistance.   11/29/2019, 6:22 PM

## 2019-11-30 ENCOUNTER — Observation Stay (HOSPITAL_COMMUNITY): Payer: Medicare Other

## 2019-11-30 DIAGNOSIS — F688 Other specified disorders of adult personality and behavior: Secondary | ICD-10-CM | POA: Diagnosis present

## 2019-11-30 DIAGNOSIS — R55 Syncope and collapse: Secondary | ICD-10-CM

## 2019-11-30 DIAGNOSIS — F79 Unspecified intellectual disabilities: Secondary | ICD-10-CM | POA: Diagnosis present

## 2019-11-30 DIAGNOSIS — E785 Hyperlipidemia, unspecified: Secondary | ICD-10-CM | POA: Diagnosis present

## 2019-11-30 DIAGNOSIS — Z66 Do not resuscitate: Secondary | ICD-10-CM | POA: Diagnosis present

## 2019-11-30 DIAGNOSIS — E782 Mixed hyperlipidemia: Secondary | ICD-10-CM | POA: Diagnosis not present

## 2019-11-30 DIAGNOSIS — E781 Pure hyperglyceridemia: Secondary | ICD-10-CM | POA: Diagnosis not present

## 2019-11-30 DIAGNOSIS — Z79899 Other long term (current) drug therapy: Secondary | ICD-10-CM | POA: Diagnosis not present

## 2019-11-30 DIAGNOSIS — I6521 Occlusion and stenosis of right carotid artery: Secondary | ICD-10-CM | POA: Diagnosis present

## 2019-11-30 DIAGNOSIS — Z20822 Contact with and (suspected) exposure to covid-19: Secondary | ICD-10-CM | POA: Diagnosis present

## 2019-11-30 DIAGNOSIS — Q909 Down syndrome, unspecified: Secondary | ICD-10-CM | POA: Diagnosis present

## 2019-11-30 DIAGNOSIS — E538 Deficiency of other specified B group vitamins: Secondary | ICD-10-CM | POA: Diagnosis present

## 2019-11-30 DIAGNOSIS — I10 Essential (primary) hypertension: Secondary | ICD-10-CM | POA: Diagnosis present

## 2019-11-30 DIAGNOSIS — Z791 Long term (current) use of non-steroidal anti-inflammatories (NSAID): Secondary | ICD-10-CM | POA: Diagnosis not present

## 2019-11-30 DIAGNOSIS — R569 Unspecified convulsions: Secondary | ICD-10-CM | POA: Diagnosis present

## 2019-11-30 DIAGNOSIS — Z880 Allergy status to penicillin: Secondary | ICD-10-CM | POA: Diagnosis not present

## 2019-11-30 DIAGNOSIS — D509 Iron deficiency anemia, unspecified: Secondary | ICD-10-CM | POA: Diagnosis present

## 2019-11-30 DIAGNOSIS — I739 Peripheral vascular disease, unspecified: Secondary | ICD-10-CM | POA: Diagnosis present

## 2019-11-30 LAB — BASIC METABOLIC PANEL
Anion gap: 12 (ref 5–15)
BUN: 10 mg/dL (ref 6–20)
CO2: 29 mmol/L (ref 22–32)
Calcium: 8.8 mg/dL — ABNORMAL LOW (ref 8.9–10.3)
Chloride: 100 mmol/L (ref 98–111)
Creatinine, Ser: 0.94 mg/dL (ref 0.61–1.24)
GFR calc Af Amer: >60 mL/min (ref >60)
GFR calc non Af Amer: >60 mL/min (ref >60)
Glucose, Bld: 105 mg/dL — ABNORMAL HIGH (ref 70–99)
Potassium: 3.9 mmol/L (ref 3.5–5.1)
Sodium: 141 mmol/L (ref 135–145)

## 2019-11-30 LAB — CBC
HCT: 43.5 % (ref 39.0–52.0)
Hemoglobin: 13.7 g/dL (ref 13.0–17.0)
MCH: 31.6 pg (ref 26.0–34.0)
MCHC: 31.5 g/dL (ref 30.0–36.0)
MCV: 100.2 fL — ABNORMAL HIGH (ref 80.0–100.0)
Platelets: 269 10*3/uL (ref 150–400)
RBC: 4.34 MIL/uL (ref 4.22–5.81)
RDW: 15 % (ref 11.5–15.5)
WBC: 8.8 10*3/uL (ref 4.0–10.5)
nRBC: 0 % (ref 0.0–0.2)

## 2019-11-30 MED ORDER — IOHEXOL 350 MG/ML SOLN
75.0000 mL | Freq: Once | INTRAVENOUS | Status: AC | PRN
  Administered 2019-11-30: 75 mL via INTRAVENOUS

## 2019-11-30 MED ORDER — LORAZEPAM 2 MG/ML IJ SOLN
1.0000 mg | INTRAMUSCULAR | Status: DC | PRN
  Administered 2019-11-30: 1 mg via INTRAVENOUS
  Filled 2019-11-30: qty 1

## 2019-11-30 NOTE — Progress Notes (Signed)
PROGRESS NOTE    Tracy Todd  DUK:[id number] DOB: 06-10-1966 DOA: 11/29/2019 PCP: Eartha Inch, MD    Brief Narrative:  [phone] who presents with recurrent syncope. Admitted for further work up  Assessment & Plan:   Principal Problem:   Syncope Active Problems:   Down syndrome   Hyperlipidemia  Syncope -Etiology is not clear -Attempted B carotid dopplers, only tolerated one side. Per [place] tech, concern for near total occlusion, however unable to assess other side as pt not cooperating with exam -Have ordered CTA neck, pending -EEG reviewed, no seizures noted -Therapy recs for HHPT and rolling walker -If source still not clearly identified, consider trial of low dose fludrocortisone for possible vasovagal  Down syndrome -Patient with chronic intellectual disability -He may have had recent personality change -neurology consulted- discussed by telephone -F/u as outpatient  HLD -Continue Pravachol as toelrated   DVT prophylaxis: Lovenox subq Code Status: DNR Family Communication: Pt in room, family at bedside  Status is: Inpatient  Remains inpatient appropriate because:Ongoing diagnostic testing needed not appropriate for outpatient work up   Dispo: The patient is from: Group home              Anticipated d/c is to: Group home              Anticipated d/c date is: 1 day              Patient currently is not medically stable to d/c.        Consultants:   Neurology  Procedures:     Antimicrobials: Anti-infectives (From admission, onward)   None       Subjective: Asleep, unable to assess. Would open eyes and look at interviewer but then close eyes again  Objective: Vitals:   11/29/19 1430 11/29/19 1745 11/29/19 1932 11/30/19 0813  BP: 120/72  122/65 114/70  Pulse: (!) 58 65 60 65  Resp: 14 16 14 19   Temp:   97.8 F (36.6 C) 97.6 F (36.4 C)  TempSrc:   Oral Oral  SpO2: 99% 100% 100% 95%    Intake/Output Summary (Last 24 hours) at  11/30/2019 1758 Last data filed at 11/30/2019 01/30/2020 Gross per 24 hour  Intake --  Output 900 ml  Net -900 ml   There were no vitals filed for this visit.  Examination:  General exam: Appears calm and comfortable  Respiratory system: Clear to auscultation. Respiratory effort normal. Cardiovascular system: S1 & S2 heard, Regular Gastrointestinal system: Abdomen is nondistended, soft and nontender. No organomegaly or masses felt. Normal bowel sounds heard. Central nervous system: arousable. No focal neurological deficits. Extremities: Symmetric 5 x 5 power. Skin: No rashes, lesions Psychiatry: unable to assess given current mentation  Data Reviewed: I have personally reviewed following labs and imaging studies  CBC: Recent Labs  Lab 11/29/19 1048 11/30/19 0639  WBC 7.9 8.8  NEUTROABS 6.8  --   HGB 12.6* 13.7  HCT 40.8 43.5  MCV 101.0* 100.2*  PLT 255 269   Basic Metabolic Panel: Recent Labs  Lab 11/29/19 1048 11/30/19 0639  NA 140 141  K 4.6 3.9  CL 102 100  CO2 28 29  GLUCOSE 103* 105*  BUN 15 10  CREATININE 1.14 0.94  CALCIUM 8.9 8.8*   GFR: CrCl cannot be calculated (Unknown ideal weight.). Liver Function Tests: No results for input(s): AST, ALT, ALKPHOS, BILITOT, PROT, ALBUMIN in the last 168 hours. No results for input(s): LIPASE, AMYLASE in the last 168 hours. No  results for input(s): AMMONIA in the last 168 hours. Coagulation Profile: No results for input(s): INR, PROTIME in the last 168 hours. Cardiac Enzymes: No results for input(s): CKTOTAL, CKMB, CKMBINDEX, TROPONINI in the last 168 hours. BNP (last 3 results) No results for input(s): PROBNP in the last 8760 hours. HbA1C: No results for input(s): HGBA1C in the last 72 hours. CBG: No results for input(s): GLUCAP in the last 168 hours. Lipid Profile: No results for input(s): CHOL, HDL, LDLCALC, TRIG, CHOLHDL, LDLDIRECT in the last 72 hours. Thyroid Function Tests: Recent Labs    11/29/19 1633   TSH 1.821   Anemia Panel: No results for input(s): VITAMINB12, FOLATE, FERRITIN, TIBC, IRON, RETICCTPCT in the last 72 hours. Sepsis Labs: No results for input(s): PROCALCITON, LATICACIDVEN in the last 168 hours.  Recent Results (from the past 240 hour(s))  SARS CORONAVIRUS 2 (TAT 6-24 HRS) Nasopharyngeal Nasopharyngeal Swab     Status: None   Collection Time: 11/29/19  3:13 PM   Specimen: Nasopharyngeal Swab  Result Value Ref Range Status   SARS Coronavirus 2 NEGATIVE NEGATIVE Final    Comment: (NOTE) SARS-CoV-2 target nucleic acids are NOT DETECTED. The SARS-CoV-2 RNA is generally detectable in upper and lower respiratory specimens during the acute phase of infection. Negative results do not preclude SARS-CoV-2 infection, do not rule out co-infections with other pathogens, and should not be used as the sole basis for treatment or other patient management decisions. Negative results must be combined with clinical observations, patient history, and epidemiological information. The expected result is Negative. Fact Sheet for Patients: HairSlick.no Fact Sheet for Healthcare Providers: quierodirigir.com This test is not yet approved or cleared by the [place] FDA and  has been authorized for detection and/or diagnosis of SARS-CoV-2 by FDA under an Emergency Use Authorization (EUA). This EUA will remain  in effect (meaning this test can be used) for the duration of the COVID-19 declaration under Section 56 4(b)(1) of the Act, 21 U.S.C. section 360bbb-3(b)(1), unless the authorization is terminated or revoked sooner. Performed at [facility], [address] [place], [place] [address]      Radiology Studies: CT Head Wo Contrast  Result Date: 11/29/2019 CLINICAL DATA:  Seizure, head injury after fall. EXAM: CT HEAD WITHOUT CONTRAST CT CERVICAL SPINE WITHOUT CONTRAST TECHNIQUE: Multidetector CT imaging of the head  and cervical spine was performed following the standard protocol without intravenous contrast. Multiplanar CT image reconstructions of the cervical spine were also generated. COMPARISON:  October 12, 2019. FINDINGS: CT HEAD FINDINGS Brain: Mild diffuse cortical atrophy is noted. No mass effect or midline shift is noted. Ventricular size is within normal limits. There is no evidence of mass lesion, hemorrhage or acute infarction. Vascular: No hyperdense vessel or unexpected calcification. Skull: Normal. Negative for fracture or focal lesion. Sinuses/Orbits: No acute finding. Other: None. CT CERVICAL SPINE FINDINGS Alignment: Mild grade 1 retrolisthesis of C3-4 is noted secondary to moderate degenerative disc disease at this level. Skull base and vertebrae: No acute fracture. No primary bone lesion or focal pathologic process. Soft tissues and spinal canal: No prevertebral fluid or swelling. No visible canal hematoma. Disc levels: Moderate degenerative disc disease is also noted at C4-5, C5-6 and C6-7 with anterior posterior osteophyte formation. Upper chest: Negative. Other: None. IMPRESSION: Mild diffuse cortical atrophy. No acute intracranial abnormality seen. Moderate multilevel degenerative disc disease. No acute abnormality seen in the cervical spine Electronically Signed   By: Lupita Raider M.D.   On: 11/29/2019 10:23  CT Cervical Spine Wo Contrast  Result Date: 11/29/2019 CLINICAL DATA:  Seizure, head injury after fall. EXAM: CT HEAD WITHOUT CONTRAST CT CERVICAL SPINE WITHOUT CONTRAST TECHNIQUE: Multidetector CT imaging of the head and cervical spine was performed following the standard protocol without intravenous contrast. Multiplanar CT image reconstructions of the cervical spine were also generated. COMPARISON:  October 12, 2019. FINDINGS: CT HEAD FINDINGS Brain: Mild diffuse cortical atrophy is noted. No mass effect or midline shift is noted. Ventricular size is within normal limits. There is no  evidence of mass lesion, hemorrhage or acute infarction. Vascular: No hyperdense vessel or unexpected calcification. Skull: Normal. Negative for fracture or focal lesion. Sinuses/Orbits: No acute finding. Other: None. CT CERVICAL SPINE FINDINGS Alignment: Mild grade 1 retrolisthesis of C3-4 is noted secondary to moderate degenerative disc disease at this level. Skull base and vertebrae: No acute fracture. No primary bone lesion or focal pathologic process. Soft tissues and spinal canal: No prevertebral fluid or swelling. No visible canal hematoma. Disc levels: Moderate degenerative disc disease is also noted at C4-5, C5-6 and C6-7 with anterior posterior osteophyte formation. Upper chest: Negative. Other: None. IMPRESSION: Mild diffuse cortical atrophy. No acute intracranial abnormality seen. Moderate multilevel degenerative disc disease. No acute abnormality seen in the cervical spine Electronically Signed   By: Marijo Conception M.D.   On: 11/29/2019 10:23   EEG adult  Result Date: 11/29/2019 Lora Havens, MD     11/29/2019  4:49 PM Patient Name: Beth Spackman MRN: [id number] Epilepsy Attending: Lora Havens Referring Physician/Provider: Dr. Karmen Bongo Date: 11/29/2019 Duration: 25.25 mins Patient history: 54 year old male with multiple syncopal episodes.  EEG evaluate for seizures. Level of alertness: Awake AEDs during EEG study: None Technical aspects: This EEG study was done with scalp electrodes positioned according to the 10-20 International system of electrode placement. Electrical activity was acquired at a sampling rate of 500Hz  and reviewed with a high frequency filter of 70Hz  and a low frequency filter of 1Hz . EEG data were recorded continuously and digitally stored. Description: During awake state, no clear posterior dominant rhythm was seen.  EEG showed continuous generalized, maximal bifrontal 3 to 6 Hz theta-delta slowing.  Hyperventilation and photic stimulation were not performed.  Abnormality -Continuous slow, generalized IMPRESSION: This study is suggestive of moderate diffuse encephalopathy, nonspecific to etiology. No seizures or epileptiform discharges were seen throughout the recording. Priyanka Barbra Sarks    Scheduled Meds: . celecoxib  100 mg Oral Daily  . diclofenac Sodium  2 g Topical QID  . enoxaparin (LOVENOX) injection  40 mg Subcutaneous Q24H  . multivitamin with minerals  1 tablet Oral Daily  . pravastatin  40 mg Oral q1800  . sodium chloride flush  3 mL Intravenous Q12H  . sucralfate  1 g Oral BID WC   Continuous Infusions: . lactated ringers 75 mL/hr at 11/30/19 0508     LOS: 0 days   Marylu Lund, MD [facility] Pager On Amion  If 7PM-7AM, please contact night-coverage 11/30/2019, 5:58 PM

## 2019-11-30 NOTE — Evaluation (Signed)
Physical Therapy Evaluation Patient Details Name: Tracy Todd MRN: [id number] DOB: [birth year] Today's Date: 11/30/2019   History of Present Illness  54 y.o. male with medical history significant of Down syndrome and seizures presenting with syncope (approx 3 separate episodes per report from group home). EEG completed and suggestive of moderate diffuse encephalopathy, nonspecific to etiology; No seizures or epileptiform discharges were seen throughout the recording.    Clinical Impression  Pt not cooperative with therapy staff - resisting OOB and transfers.  Pt was incontinent of bowels in the bed - resistive to [place] cleaning him up.  Pt total assist with all attempted mobilty due to his resistance - even with his dad present for reassurance.  Unsure how much pt was abel to do and how much assistance group home is able to give.  Pts dad said pt has been declining - physically and mentally - over the last 3 months.  Pt will do better in familiar situation.  I recommend [place] services to assist in group home if they can provide 24/7 physical assist.      Follow Up Recommendations Home health PT;Supervision/Assistance - 24 hour    Equipment Recommendations  Rolling walker with 5" wheels(if pt shows progress with OOB and walking - unsure if group home has access to one)    Recommendations for Other Services       Precautions / Restrictions Precautions Precautions: Fall Precaution Comments: hx of syncopy, nonverbal at baseline Restrictions Weight Bearing Restrictions: No      Mobility  Bed Mobility Overal bed mobility: Needs Assistance Bed Mobility: Rolling;Supine to Sit;Sit to Supine Rolling: Total assist;+2 for physical assistance   Supine to sit: Total assist;+2 for physical assistance Sit to supine: Total assist;+2 for physical assistance   General bed mobility comments: totalA for all mobility due to pt with minimal to no command following and resistive to movements; rolling to L/R  for pericare and changing bed linens. Completed supine<>sit x2 as father present end of session and attempted to see if pt would have increased participation with father's encouragement   Transfers                 General transfer comment: attempted sit<>stand as well as squat pivot to [facility]; pt very resistive and leaning opposite direction as therapist direction, unsafe to attempt further   Ambulation/Gait             General Gait Details: pt wouldnt cooperate and keep feet on floor - resisting oob.  Pts father said it took 3 people to get pt OOB last hospital stay  Stairs            Wheelchair Mobility    Modified Rankin (Stroke Patients Only)       Balance Overall balance assessment: Needs assistance Sitting-balance support: Feet supported Sitting balance-Leahy Scale: Poor Sitting balance - Comments: overall requiring at least minA for balance, L lateral lean - with pt wanting to lay back down in bed with pillow to the left Postural control: Left lateral lean     Standing balance comment: unabel to assess                             Pertinent Vitals/Pain Pain Assessment: Faces Faces Pain Scale: Hurts little more Pain Location: generalized  Pain Descriptors / Indicators: Discomfort;Grimacing Pain Intervention(s): Limited activity within patient's tolerance;Monitored during session;Repositioned    Home Living Family/patient expects to be discharged to:: Group  home                 Additional Comments: Father doesnt think pt using AD.  He reports pt has been going downhill for last 3 months    Prior Function Level of Independence: Needs assistance   Gait / Transfers Assistance Needed: pt's father reports he is typically ambulatory with staff assist   ADL's / Homemaking Assistance Needed: suspect required assist for ADL  Comments: nonverbal at baseline     Hand Dominance        Extremity/Trunk Assessment   Upper Extremity  Assessment Upper Extremity Assessment: Defer to OT evaluation    Lower Extremity Assessment Lower Extremity Assessment: Generalized weakness;Difficult to assess due to impaired cognition    Cervical / Trunk Assessment Cervical / Trunk Assessment: Kyphotic  Communication   Communication: Receptive difficulties;Expressive difficulties  Cognition Arousal/Alertness: Awake/alert Behavior During Therapy: Flat affect Overall Cognitive Status: History of cognitive impairments - at baseline                                 General Comments: pt with history of downs syndrome, does not follow commands this session, resistive to therapist's attempts to mobilize       General Comments General comments (skin integrity, edema, etc.): attempted orthostatics - pt 105/63 supine/reclined.  sitting - pt wouldnt keep arm still.  pt refused standing.    Exercises     Assessment/Plan    PT Assessment Patient needs continued PT services  PT Problem List         PT Treatment Interventions DME instruction;Therapeutic activities;Cognitive remediation;Gait training;Therapeutic exercise;Patient/family education;Balance training;Functional mobility training    PT Goals (Current goals can be found in the Care Plan section)  Acute Rehab PT Goals Patient Stated Goal: none stated PT Goal Formulation: With patient/family Time For Goal Achievement: 12/14/19 Potential to Achieve Goals: Fair    Frequency Min 3X/week   Barriers to discharge   unsure how much assist pt has at group home setting    Co-evaluation PT/OT/SLP Co-Evaluation/Treatment: Yes Reason for Co-Treatment: Necessary to address cognition/behavior during functional activity;For patient/therapist safety;To address functional/ADL transfers PT goals addressed during session: Mobility/safety with mobility;Balance OT goals addressed during session: ADL's and self-care;Proper use of Adaptive equipment and DME       AM-PAC PT  "6 Clicks" Mobility  Outcome Measure Help needed turning from your back to your side while in a flat bed without using bedrails?: Total Help needed moving from lying on your back to sitting on the side of a flat bed without using bedrails?: Total Help needed moving to and from a bed to a chair (including a wheelchair)?: Total Help needed standing up from a chair using your arms (e.g., wheelchair or bedside chair)?: Total Help needed to walk in hospital room?: Total Help needed climbing 3-5 steps with a railing? : Total 6 Click Score: 6    End of Session   Activity Tolerance: Treatment limited secondary to agitation;No increased pain Patient left: in bed;with bed alarm set;with family/visitor present Nurse Communication: Mobility status PT Visit Diagnosis: Other abnormalities of gait and mobility (R26.89);Muscle weakness (generalized) (M62.81);History of falling (Z91.81);Difficulty in walking, not elsewhere classified (R26.2);Adult, failure to thrive (R62.7)    Time: 0914-1002 PT Time Calculation (min) (ACUTE ONLY): 48 min   Charges:   PT Evaluation $PT Eval Low Complexity: 1 Low          11/30/2019  Ranae Palms, PT   Judson Roch 11/30/2019, 1:18 PM

## 2019-11-30 NOTE — Progress Notes (Signed)
Patient refused AM labs. MD aware.

## 2019-11-30 NOTE — Evaluation (Signed)
Occupational Therapy Evaluation Patient Details Name: Tracy Todd MRN: [id number] DOB: Oct 07, 1965 Today's Date: 11/30/2019    History of Present Illness 54 y.o. male with medical history significant of Down syndrome and seizures presenting with syncope (approx 3 separate episodes per report from group home). EEG completed and suggestive of moderate diffuse encephalopathy, nonspecific to etiology; No seizures or epileptiform discharges were seen throughout the recording.   Clinical Impression   This 54 y/o male presents with the above. PTA pt living in group home, per father report was ambulatory with staff assist, receiving assist with ADL tasks. Pt nonverbal at baseline and with minimal to no command following this session. Pt resistive to attempts to mobilize and assist with ADL tasks. He tolerated sitting EOB x2 during session but unable to safely stand (as pt resisting attempts) to assess orthostatic vitals. Overall pt requiring two person assist for completion of bed mobility, requiring totalA for ADL given impaired cognition. He will benefit from continued acute OT services, recommend pt return to group home at time of discharge as feel he will likely perform better in familiar environment. Pt may benefit from [facility] services after discharge (pending progress) to maximize his overall strength and safety with ADL/mobility. Will continue to follow.     Follow Up Recommendations  Home health OT;Supervision/Assistance - 24 hour(return back to group home/familiar environment)    Equipment Recommendations  3 in 1 bedside commode;Wheelchair (measurements OT);Wheelchair cushion (measurements OT)(w/c pending progress with mobility )           Precautions / Restrictions Precautions Precautions: Fall Precaution Comments: hx of syncopy, nonverbal at baseline Restrictions Weight Bearing Restrictions: No      Mobility Bed Mobility Overal bed mobility: Needs Assistance Bed Mobility:  Rolling;Supine to Sit;Sit to Supine Rolling: Total assist;+2 for physical assistance   Supine to sit: Total assist;+2 for physical assistance Sit to supine: Total assist;+2 for physical assistance   General bed mobility comments: totalA for all mobility due to pt with minimal to no command following and resistive to movements; rolling to L/R for pericare and changing bed linens. Completed supine<>sit x2 as father present end of session and attempted to see if pt would have increased participation with father's encouragement   Transfers                 General transfer comment: attempted sit<>stand as well as squat pivot to [facility]; pt very resistive and leaning opposite direction as therapist direction, unsafe to attempt further     Balance Overall balance assessment: Needs assistance Sitting-balance support: Feet supported Sitting balance-Leahy Scale: Poor Sitting balance - Comments: overall requiring at least minA for balance, L lateral lean Postural control: Left lateral lean                                 ADL either performed or assessed with clinical judgement   ADL Overall ADL's : Needs assistance/impaired                       Lower Body Dressing Details (indicate cue type and reason): attempted to engage pt with donning pants (vs gown), pt resistive to attempts, does lift his LEs minimally to assist with donning socks at bed level                General ADL Comments: overall pt totalA for ADL given cognitive impairments. Attempted transfer to [facility] but pt  very resistive and leaning opposite direction of therapists. Returned to supine for totalA pericare and linen/gown change due to pt being soiled      Museum/gallery curator      Pertinent Vitals/Pain Pain Assessment: Faces Faces Pain Scale: Hurts little more Pain Location: generalized  Pain Descriptors / Indicators: Discomfort;Grimacing Pain Intervention(s): Limited  activity within patient's tolerance;Monitored during session;Repositioned     Hand Dominance     Extremity/Trunk Assessment Upper Extremity Assessment Upper Extremity Assessment: Generalized weakness;Difficult to assess due to impaired cognition   Lower Extremity Assessment Lower Extremity Assessment: Defer to PT evaluation       Communication Communication Communication: Receptive difficulties;Expressive difficulties;Other (comment)(nonverbal)   Cognition Arousal/Alertness: Awake/alert Behavior During Therapy: Flat affect Overall Cognitive Status: History of cognitive impairments - at baseline                                 General Comments: pt with history of downs syndrome, does not follow commands this session, resistive to therapist's attempts to mobilize    General Comments  attempted to obtain orthostatic vitals during session but unable to complete given cognitive deficits     Exercises     Shoulder Instructions      Home Living Family/patient expects to be discharged to:: Group home                                        Prior Functioning/Environment Level of Independence: Needs assistance  Gait / Transfers Assistance Needed: pt's father reports he is typically ambulatory with staff assist  ADL's / Homemaking Assistance Needed: suspect required assist for ADL   Comments: nonverbal at baseline        OT Problem List: Decreased strength;Decreased range of motion;Decreased activity tolerance;Impaired balance (sitting and/or standing);Decreased cognition;Decreased safety awareness      OT Treatment/Interventions: Self-care/ADL training;Therapeutic exercise;Neuromuscular education;Energy conservation;DME and/or AE instruction;Therapeutic activities;Balance training;Patient/family education;Cognitive remediation/compensation    OT Goals(Current goals can be found in the care plan section) Acute Rehab OT Goals Patient Stated Goal:  none stated OT Goal Formulation: With patient Time For Goal Achievement: 12/14/19 Potential to Achieve Goals: Fair  OT Frequency: Min 2X/week   Barriers to D/C:            Co-evaluation PT/OT/SLP Co-Evaluation/Treatment: Yes Reason for Co-Treatment: For patient/therapist safety;To address functional/ADL transfers;Necessary to address cognition/behavior during functional activity   OT goals addressed during session: ADL's and self-care      AM-PAC OT "6 Clicks" Daily Activity     Outcome Measure Help from another person eating meals?: Total Help from another person taking care of personal grooming?: Total Help from another person toileting, which includes using toliet, bedpan, or urinal?: Total Help from another person bathing (including washing, rinsing, drying)?: Total Help from another person to put on and taking off regular upper body clothing?: Total Help from another person to put on and taking off regular lower body clothing?: Total 6 Click Score: 6   End of Session Nurse Communication: Mobility status  Activity Tolerance: Treatment limited secondary to agitation;Other (comment)(limited due to cognitive deficits) Patient left: in bed;with call bell/phone within reach;with bed alarm set;with family/visitor present  OT Visit Diagnosis: Other abnormalities of gait and mobility (R26.89);Other symptoms and signs involving cognitive function;History  of falling (Z91.81)                Time: 1191-4782 OT Time Calculation (min): 48 min Charges:  OT General Charges $OT Visit: 1 Visit OT Evaluation $OT Eval Moderate Complexity: 1 Mod OT Treatments $Self Care/Home Management : 8-22 mins  Marcy Siren, OT Acute Rehabilitation Services Pager [phone] Office [phone]    Orlando Penner 11/30/2019, 12:12 PM

## 2019-11-30 NOTE — Progress Notes (Signed)
Daily progress notes.  Patient was plaesant and compliant at bedside upon shift report. After PT/OT patient became non compliant and refused all meds and procedures. Patient was given ativan per MD order before CT scan of neck

## 2019-11-30 NOTE — Care Management (Addendum)
Attempted to complete OBS notice.  Father could not be reached. Mobile number did not have VM set up and home number rang a fast busy signal  [place],Bill Father [phone]  [phone]

## 2019-11-30 NOTE — Progress Notes (Signed)
Patient will need staff to order meals and will need setting up in order to eat. Patient will say yes/no for food he likes to eat if you give him options.

## 2019-11-30 NOTE — Progress Notes (Signed)
VASCULAR LAB PRELIMINARY  PRELIMINARY  PRELIMINARY  PRELIMINARY  Carotid duplex completed.    Preliminary report:  See CV proc for preliminary results.  Spoke with Dr. Rhona Leavens regarding results.   Sanya Kobrin, RVT 11/30/2019, 6:27 PM

## 2019-12-01 DIAGNOSIS — R55 Syncope and collapse: Secondary | ICD-10-CM

## 2019-12-01 DIAGNOSIS — I6521 Occlusion and stenosis of right carotid artery: Secondary | ICD-10-CM

## 2019-12-01 DIAGNOSIS — E782 Mixed hyperlipidemia: Secondary | ICD-10-CM

## 2019-12-01 DIAGNOSIS — Q909 Down syndrome, unspecified: Secondary | ICD-10-CM | POA: Diagnosis not present

## 2019-12-01 NOTE — Progress Notes (Signed)
PROGRESS NOTE    Tracy Todd  KAJ:[id number] DOB: 08/15/1966 DOA: 11/29/2019 PCP: Eartha Inch, MD    Brief Narrative:  [phone] who presents with recurrent syncope. Admitted for further work up  Assessment & Plan:   Principal Problem:   Syncope Active Problems:   Down syndrome   Hyperlipidemia  Syncope -Attempted B carotid dopplers, only tolerated one side. Per [place] tech, concern for near total occlusion, however unable to assess other side as pt not cooperating with exam -Have ordered CTA neck, results reviewed with findings concerning for R ICA occlusion distal to the bulb -Have consulted Vascular Surgery as well as Neurology -EEG reviewed, no seizures noted -Therapy recs for HHPT and rolling walker  Down syndrome -Patient with chronic intellectual disability -He may have had recent personality change -neurology consulted- discussed by telephone -recommend outpt f/u  HLD -Continue Pravachol as toelrated   DVT prophylaxis: Lovenox subq Code Status: DNR Family Communication: Pt in room, family at bedside  Status is: Inpatient  Remains inpatient appropriate because:Ongoing diagnostic testing needed not appropriate for outpatient work up   Dispo: The patient is from: Group home              Anticipated d/c is to: Group home              Anticipated d/c date is: 1 day              Patient currently is not medically stable to d/c.        Consultants:   Neurology  Vascular Surgery  Procedures:     Antimicrobials: Anti-infectives (From admission, onward)   None      Subjective: Remains asleep. Staff reports pt pleasant but sometimes not cooperative when attempting procedures  Objective: Vitals:   11/30/19 2133 12/01/19 0500 12/01/19 0846 12/01/19 0900  BP: 98/60  110/76   Pulse: 61  96   Resp: 18  17   Temp: 98 F (36.7 C)  98.7 F (37.1 C)   TempSrc:      SpO2:    98%  Weight:  56.2 kg      Intake/Output Summary (Last 24 hours) at  12/01/2019 1345 Last data filed at 12/01/2019 0300 Gross per 24 hour  Intake 917.12 ml  Output 850 ml  Net 67.12 ml   Filed Weights   12/01/19 0500  Weight: 56.2 kg    Examination: General exam: Asleep, laying in bed, in nad Respiratory system: Normal respiratory effort, no wheezing Cardiovascular system: regular rate, s1, s2 Gastrointestinal system: Soft, nondistended, positive BS Central nervous system: CN2-12 grossly intact, strength intact Extremities: Perfused, no clubbing Skin: Normal skin turgor, no notable skin lesions seen Psychiatry: Unable to assess given current mentation  Data Reviewed: I have personally reviewed following labs and imaging studies  CBC: Recent Labs  Lab 11/29/19 1048 11/30/19 0639  WBC 7.9 8.8  NEUTROABS 6.8  --   HGB 12.6* 13.7  HCT 40.8 43.5  MCV 101.0* 100.2*  PLT 255 269   Basic Metabolic Panel: Recent Labs  Lab 11/29/19 1048 11/30/19 0639  NA 140 141  K 4.6 3.9  CL 102 100  CO2 28 29  GLUCOSE 103* 105*  BUN 15 10  CREATININE 1.14 0.94  CALCIUM 8.9 8.8*   GFR: CrCl cannot be calculated (Unknown ideal weight.). Liver Function Tests: No results for input(s): AST, ALT, ALKPHOS, BILITOT, PROT, ALBUMIN in the last 168 hours. No results for input(s): LIPASE, AMYLASE in the last 168 hours.  No results for input(s): AMMONIA in the last 168 hours. Coagulation Profile: No results for input(s): INR, PROTIME in the last 168 hours. Cardiac Enzymes: No results for input(s): CKTOTAL, CKMB, CKMBINDEX, TROPONINI in the last 168 hours. BNP (last 3 results) No results for input(s): PROBNP in the last 8760 hours. HbA1C: No results for input(s): HGBA1C in the last 72 hours. CBG: No results for input(s): GLUCAP in the last 168 hours. Lipid Profile: No results for input(s): CHOL, HDL, LDLCALC, TRIG, CHOLHDL, LDLDIRECT in the last 72 hours. Thyroid Function Tests: Recent Labs    11/29/19 1633  TSH 1.821   Anemia Panel: No results for  input(s): VITAMINB12, FOLATE, FERRITIN, TIBC, IRON, RETICCTPCT in the last 72 hours. Sepsis Labs: No results for input(s): PROCALCITON, LATICACIDVEN in the last 168 hours.  Recent Results (from the past 240 hour(s))  SARS CORONAVIRUS 2 (TAT 6-24 HRS) Nasopharyngeal Nasopharyngeal Swab     Status: None   Collection Time: 11/29/19  3:13 PM   Specimen: Nasopharyngeal Swab  Result Value Ref Range Status   SARS Coronavirus 2 NEGATIVE NEGATIVE Final    Comment: (NOTE) SARS-CoV-2 target nucleic acids are NOT DETECTED. The SARS-CoV-2 RNA is generally detectable in upper and lower respiratory specimens during the acute phase of infection. Negative results do not preclude SARS-CoV-2 infection, do not rule out co-infections with other pathogens, and should not be used as the sole basis for treatment or other patient management decisions. Negative results must be combined with clinical observations, patient history, and epidemiological information. The expected result is Negative. Fact Sheet for Patients: SugarRoll.be Fact Sheet for Healthcare Providers: https://www.woods-mathews.com/ This test is not yet approved or cleared by the [place] FDA and  has been authorized for detection and/or diagnosis of SARS-CoV-2 by FDA under an Emergency Use Authorization (EUA). This EUA will remain  in effect (meaning this test can be used) for the duration of the COVID-19 declaration under Section 56 4(b)(1) of the Act, 21 U.S.C. section 360bbb-3(b)(1), unless the authorization is terminated or revoked sooner. Performed at [facility], [place] [address] [place], [place] [address]      Radiology Studies: CT ANGIO NECK W OR WO CONTRAST  Addendum Date: 11/30/2019   ADDENDUM REPORT: 11/30/2019 18:17 ADDENDUM: Study discussed by telephone with Dr. Marylu Lund on 11/30/2019 at 1808 hours. Electronically Signed   By: Genevie Ann M.D.   On: 11/30/2019 18:17    Result Date: 11/30/2019 CLINICAL DATA:  54 year old male with recent seizure, fall, head injury. EXAM: CT ANGIOGRAPHY NECK TECHNIQUE: Multidetector CT imaging of the neck was performed using the standard protocol during bolus administration of intravenous contrast. Multiplanar CT image reconstructions and MIPs were obtained to evaluate the vascular anatomy. Carotid stenosis measurements (when applicable) are obtained utilizing NASCET criteria, using the distal internal carotid diameter as the denominator. CONTRAST:  13mL OMNIPAQUE IOHEXOL 350 MG/ML SOLN COMPARISON:  CT head and cervical spine yesterday. CTA chest 09/16/2019. FINDINGS: Skeleton: Very severe chronic cervical spine degeneration, including degenerative appearing widening of the atlanto dens interval. There is also severe joint degeneration at the left occiput-C1 articulation. Diffuse cervical spinal stenosis, which could be moderate or severe at several levels (C3-C4 series 4, image 87). No acute osseous abnormality identified. Upper chest: Negative upper chest aside from what is probably dependent atelectasis in the left lung. Other neck: No acute neck soft tissue findings. Negative visible brain parenchyma. Aortic arch: 3 vessel arch configuration with no arch atherosclerosis. Right carotid system: Negative right CCA  and right carotid bifurcation. There is irregularity due to soft plaque at the level of the distal right ICA bulb, where the vessel rapidly tapers (series 8, image 97) and remains diminutive than to the skull base. The visible right ICA siphon demonstrates only thread-like enhancement (series 4, image 8), and the anterior genu may be occluded at the level of calcified plaque, uncertain. Left carotid system: Negative left CCA, left carotid bifurcation and cervical left ICA. Visible left ICA siphon is patent with mild supraclinoid calcified plaque. Vertebral arteries: Proximal right subclavian artery and right vertebral artery origin  are patent without stenosis. The right vertebral appears non dominant but is patent to the vertebrobasilar junction. The right V4 segment is diminutive beyond the right PICA origin. Negative visible basilar artery. Proximal left subclavian artery and left vertebral artery origin are normal. The left vertebral is dominant and patent to the basilar without stenosis. Normal left PICA origin. Review of the MIP images confirms the above findings IMPRESSION: 1. Abnormal, rapidly tapering Right ICA distal to the bulb, and only thread-like enhancement of the visible Right ICA siphon which is likely to be occluded or functionally occluded distally. 2. Negative left carotid and bilateral vertebral arteries (the left vertebral is dominant). 3. Very severe degeneration of the craniocervical junction and cervical spine, with suspected moderate or severe cervical spinal stenosis at multiple levels. Electronically Signed: By: Odessa Fleming M.D. On: 11/30/2019 17:55   EEG adult  Result Date: 11/29/2019 Charlsie Quest, MD     11/29/2019  4:49 PM Patient Name: Tracy Todd MRN: [id number] Epilepsy Attending: Charlsie Quest Referring Physician/Provider: Dr. Jonah Blue Date: 11/29/2019 Duration: 25.25 mins Patient history: 54 year old male with multiple syncopal episodes.  EEG evaluate for seizures. Level of alertness: Awake AEDs during EEG study: None Technical aspects: This EEG study was done with scalp electrodes positioned according to the 10-20 International system of electrode placement. Electrical activity was acquired at a sampling rate of 500Hz  and reviewed with a high frequency filter of 70Hz  and a low frequency filter of 1Hz . EEG data were recorded continuously and digitally stored. Description: During awake state, no clear posterior dominant rhythm was seen.  EEG showed continuous generalized, maximal bifrontal 3 to 6 Hz theta-delta slowing.  Hyperventilation and photic stimulation were not performed. Abnormality  -Continuous slow, generalized IMPRESSION: This study is suggestive of moderate diffuse encephalopathy, nonspecific to etiology. No seizures or epileptiform discharges were seen throughout the recording. Priyanka O Yadav   VAS CAROTID  Result Date: 12/01/2019 Carotid Arterial Duplex Study Indications:       Syncope and Down's Syndrome. Risk Factors:      Hyperlipidemia. Limitations        Today's exam was limited due to the patient's inability or                    unwillingness to cooperate and the body habitus of the                    patient. Comparison Study:  No prior study on file Performing Technologist: RVS  Examination Guidelines: A complete evaluation includes B-mode imaging, spectral Doppler, color Doppler, and power Doppler as needed of all accessible portions of each vessel. Bilateral testing is considered an integral part of a complete examination. Limited examinations for reoccurring indications may be performed as noted.  Right Carotid Findings: +----------+--------+--------+--------+------------------+---------------+           PSV cm/sEDV cm/sStenosisPlaque DescriptionComments        +----------+--------+--------+--------+------------------+---------------+  CCA Prox  87      12              homogeneous                       +----------+--------+--------+--------+------------------+---------------+ CCA Distal83      7               homogeneous                       +----------+--------+--------+--------+------------------+---------------+ ICA Prox  11      3       Occluded                  Trickle flow    +----------+--------+--------+--------+------------------+---------------+ ICA Mid   27                                                        +----------+--------+--------+--------+------------------+---------------+ ICA Distal55      24                                                 +----------+--------+--------+--------+------------------+---------------+ ECA       61      4                                 spiked waveform +----------+--------+--------+--------+------------------+---------------+ +---------+--------+--+--------+-+---------------+ VertebralPSV cm/s52EDV cm/s7Spiked waveform +---------+--------+--+--------+-+---------------+  Left Carotid Findings: +---------+--------+--+--------+--+--------+ VertebralPSV cm/s92EDV cm/s38Atypical +---------+--------+--+--------+--+--------+   Summary: Right Carotid: Evidence consistent with a total occlusion of the right ICA. Left Carotid: Unable to accurately evaluate the left Carotid arteries secondary               to patient's inability to cooperate and tortuosity of vessels. Vertebrals:  Atypical waveforms bilaterally. Subclavians: Bilateral subclavian arteries were not visualized. *See table(s) above for measurements and observations.  Electronically signed by Delia Heady MD on 12/01/2019 at 11:33:12 AM.    Final     Scheduled Meds: . celecoxib  100 mg Oral Daily  . diclofenac Sodium  2 g Topical QID  . enoxaparin (LOVENOX) injection  40 mg Subcutaneous Q24H  . multivitamin with minerals  1 tablet Oral Daily  . pravastatin  40 mg Oral q1800  . sodium chloride flush  3 mL Intravenous Q12H  . sucralfate  1 g Oral BID WC   Continuous Infusions: . lactated ringers 75 mL/hr at 12/01/19 0853     LOS: 1 day   Rickey Barbara, MD [facility] Pager On Amion  If 7PM-7AM, please contact night-coverage 12/01/2019, 1:45 PM

## 2019-12-01 NOTE — Consult Note (Addendum)
Hospital Consult    Reason for Consult:  Syncope Requesting Physician:  Dr. Wyline Copas MRN #:  [id number]  History of Present Illness: This is a 54 y.o. male with past medical history significant for Down syndrome.  He is being seen in consultation for evaluation of recurrent syncope.  Work-up included carotid ultrasound which demonstrated right ICA occlusion.  CT angio of the neck demonstrates rapid tapering of right ICA with likely occlusion near carotid siphon.  Vertebral arteries and left ICA are patent without hemodynamically significant stenosis.  Patient's father is present during exam and he is the primary historian.  Father states the patient lives in a group home and is unsure of any strokelike symptoms including slurring of speech, changes in vision, or one-sided weakness recently.  Main concern is of syncopal episodes.  Past Medical History:  Diagnosis Date  . Down's syndrome   . Seizures ([place])     History reviewed. No pertinent surgical history.  Allergies  Allergen Reactions  . Penicillins     unknown    Prior to Admission medications   Medication Sig Start Date End Date Taking? Authorizing Provider  celecoxib (CELEBREX) 100 MG capsule Take 100 mg by mouth daily.   Yes [provider]  ciclopirox (LOPROX) 0.77 % SUSP Apply 1 application topically 2 (two) times daily as needed (Dry face and scalp).  11/25/19  Yes [provider]  diazepam (VALIUM) 5 MG tablet Take 5 mg by mouth See admin instructions. Take 1 tablet 30 minutes prior to procedure, not to exceed 2 tablets.   Yes [provider]  diclofenac Sodium (VOLTAREN) 1 % GEL Apply 1 g topically 4 (four) times daily. To both knees   Yes [provider]  FIBER ADULT GUMMIES PO Take 2 tablets by mouth daily.   Yes [provider]  iron polysaccharides (NU-IRON) 150 MG capsule Take 1 capsule (150 mg total) by mouth daily. 09/17/19  Yes Ladona Horns, MD  ketoconazole (NIZORAL) 2 %  shampoo Apply 1 application topically See admin instructions. Use to wash hair and face, alternate by week. 08/19/19  Yes [provider]  loperamide (IMODIUM A-D) 2 MG tablet Take 2 mg by mouth 2 (two) times daily.    Yes [provider]  miconazole (MICATIN) 2 % cream Apply 1 application topically 2 (two) times daily. Apply to feet   Yes [provider]  Multiple Vitamins-Minerals (MULTIVITAMIN WITH MINERALS) tablet Take 1 tablet by mouth daily.   Yes [provider]  nystatin cream (MYCOSTATIN) Apply 1 application topically 3 (three) times daily as needed for dry skin. 01/21/19  Yes [provider]  pravastatin (PRAVACHOL) 40 MG tablet Take 40 mg by mouth daily. 10/25/11  Yes [provider]  selenium sulfide (SELSUN) 1 % LOTN Apply 1 application topically See admin instructions. Use to wash hair and face alternate by week.   Yes [provider]  Soap & Cleansers (CERAVE HYDRATING CLEANSER) LIQD Apply 1 application topically daily. Use with cool showers   Yes [provider]  sucralfate (CARAFATE) 1 g tablet Take 1 g by mouth 2 (two) times daily. 01/03/19  Yes [provider]  vitamin B-12 (CYANOCOBALAMIN) 500 MCG tablet Take 1 tablet (500 mcg total) by mouth daily. 09/17/19  Yes Ladona Horns, MD    Social History   Socioeconomic History  . Marital status: Single    Spouse name: Not on file  . Number of children: Not on file  . Years of  education: Not on file  . Highest education level: Not on file  Occupational History  . Not on file  Tobacco Use  . Smoking status: Never Smoker  . Smokeless tobacco: Never Used  Substance and Sexual Activity  . Alcohol use: Never  . Drug use: Never  . Sexual activity: Not on file  Other Topics Concern  . Not on file  Social History Narrative  . Not on file   Social Determinants of Health   Financial Resource Strain:   . Difficulty of Paying Living Expenses:   Food  Insecurity:   . Worried About Programme researcher, broadcasting/film/video in the Last Year:   . Barista in the Last Year:   Transportation Needs:   . Freight forwarder (Medical):   Marland Kitchen Lack of Transportation (Non-Medical):   Physical Activity:   . Days of Exercise per Week:   . Minutes of Exercise per Session:   Stress:   . Feeling of Stress :   Social Connections:   . Frequency of Communication with Friends and Family:   . Frequency of Social Gatherings with Friends and Family:   . Attends Religious Services:   . Active Member of Clubs or Organizations:   . Attends Banker Meetings:   Marland Kitchen Marital Status:   Intimate Partner Violence:   . Fear of Current or Ex-Partner:   . Emotionally Abused:   Marland Kitchen Physically Abused:   . Sexually Abused:      No family history on file.  ROS: Otherwise negative unless mentioned in HPI  Physical Examination  Vitals:   12/01/19 0846 12/01/19 0900  BP: 110/76   Pulse: 96   Resp: 17   Temp: 98.7 F (37.1 C)   SpO2:  98%   There is no height or weight on file to calculate BMI.  General:  WDWN in NAD Gait: Not observed HENT: WNL, normocephalic Pulmonary: normal non-labored breathing, without Rales, rhonchi,  wheezing Cardiac: regular Abdomen:  soft, NT/ND, no masses Skin: without rashes Vascular Exam/Pulses: Symmetrical radial pulses; symmetrical anterior tibial artery pulses Extremities: without ischemic changes, without Gangrene , without cellulitis; without open wounds;  Musculoskeletal: no muscle wasting or atrophy  Neurologic: Exam limited; patient did not want to participate in physical exam Psychiatric: Baseline Lymph:  Unremarkable  CBC    Component Value Date/Time   WBC 8.8 11/30/2019 0639   RBC 4.34 11/30/2019 0639   HGB 13.7 11/30/2019 0639   HCT 43.5 11/30/2019 0639   PLT 269 11/30/2019 0639   MCV 100.2 (H) 11/30/2019 0639   MCH 31.6 11/30/2019 0639   MCHC 31.5 11/30/2019 0639   RDW 15.0 11/30/2019 0639   LYMPHSABS  0.5 (L) 11/29/2019 1048   MONOABS 0.3 11/29/2019 1048   EOSABS 0.0 11/29/2019 1048   BASOSABS 0.1 11/29/2019 1048    BMET    Component Value Date/Time   NA 141 11/30/2019 0639   K 3.9 11/30/2019 0639   CL 100 11/30/2019 0639   CO2 29 11/30/2019 0639   GLUCOSE 105 (H) 11/30/2019 0639   BUN 10 11/30/2019 0639   CREATININE 0.94 11/30/2019 0639   CALCIUM 8.8 (L) 11/30/2019 0639   GFRNONAA >60 11/30/2019 0639   GFRAA >60 11/30/2019 0639    COAGS: No results found for: INR, PROTIME   Non-Invasive Vascular Imaging:   Right ICA occluded based on carotid duplex  CTA demonstrating rapid right ICA taper after bifurcation with occlusion near carotid siphon; right ICA very small in  diameter throughout its course  Vertebrals and left ICA patent    ASSESSMENT/PLAN: This is a 53 y.o. male with recurrent syncope; right ICA occlusion  -Right ICA occluded by carotid duplex; CTA also suggesting more distal occlusion near carotid siphon -No indication for revascularization of right ICA -Neurology consulted by primary team for evaluation of recurrent syncope -On-call vascular surgeon Dr. Randie Heinz will evaluate the patient later today   Emilie Rutter PA-C [facility] [phone]   I have independently interviewed and examined patient and agree with PA assessment and plan above.  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  Would not suggest intervention.  Vascular surgery available as needed.  Luvern Mcisaac C. Randie Heinz, MD [facility] [place] Office: [phone] Pager: [phone]

## 2019-12-01 NOTE — Consult Note (Addendum)
NEURO HOSPITALIST CONSULT NOTE   Requesting physician: Dr. Rhona Leavens   Reason for Consult: syncope   History obtained from: Chart review HPI:                                                                                                                                          Tracy Todd is an 54 y.o. male with past medical history of Down syndrome presents to the ED for possible seizure versus syncope.  Per chart: Patient lives at a group home and per their report he was found in the bathroom passed out.  This is his third time in the last 3 months.  Family has noted patient having an overall declinel since his second COVID vaccine. Group home staff have noticed that he isn't his usual happy self over the last few months.  Regarding his episodes of passing out, all 3 occurred in the bathroom.  No associated tongue bite, although on one episode there was a tongue abrasion..  No weakness jerking movements by staff.     Hospital course: CTH: did not show anything acute.  TSH: WNL, BMP: unremarkable BP: 110/76 BG: 105 afebrile The CTA neck . Abnormal, rapidly tapering Right ICA distal to the bulb, and onlythread-like enhancement of the visible Right ICA siphon which is likely to be occluded or functionally occluded distally. Negative left carotid and bilateral vertebral Carotid doppler: total occlusion of Right ICA. Unable to evaluate left ICA d/t patient cooperation. Past Medical History:  Diagnosis Date  . Down's syndrome   . Seizures (HCC)     History reviewed. No pertinent surgical history.  No family history on file.           Social History:  reports that he has never smoked. He has never used smokeless tobacco. He reports that he does not drink alcohol or use drugs.  Allergies  Allergen Reactions  . Penicillins     unknown    MEDICATIONS:                                                                                                                      Scheduled: . celecoxib  100 mg Oral Daily  . diclofenac Sodium  2 g Topical QID  . enoxaparin (LOVENOX) injection  40 mg Subcutaneous Q24H  . multivitamin with minerals  1 tablet Oral Daily  . pravastatin  40 mg Oral q1800  . sodium chloride flush  3 mL Intravenous Q12H  . sucralfate  1 g Oral BID WC   Continuous: . lactated ringers 75 mL/hr at 12/01/19 0853   ZOX:[id number]:acetaminophen **OR** acetaminophen, loperamide, LORazepam, ondansetron **OR** ondansetron (ZOFRAN) IV   ROS:                                                                                                                                        unobtainable from patient due to lack of cooperation   Blood pressure 110/76, pulse 96, temperature 98.7 F (37.1 C), resp. rate 17, weight 56.2 kg, SpO2 98 %.   General Examination:                                                                                                       Physical Exam  Constitutional: Appears well-developed and well-nourished.  Psych: Affect appropriate to situation Eyes: Normal external eye and conjunctiva. HENT: Normocephalic, no lesions, without obvious abnormality.   Musculoskeletal-no joint tenderness, deformity or swelling Cardiovascular: Normal rate and regular rhythm.  Respiratory: Effort normal, non-labored breathing saturations WNL on RA GI: Soft.  No distension. There is no tenderness.  Skin: WDI  Neurological Examination Mental Status: Patient completely uncooperative with exam. Remains in bed in fetal position with head burried in pillow. Patient non verbal for my exam. Did shake his head no appropriately to " will you turn over". Refused to follow any commands.  Cranial Nerves: Unable to visualize face.  Tone and bulk:normal tone throughout; no atrophy noted Motor/Sensory: able to localize and withdraw to noxious on all 4 extremities.  DTR: unable to obtain td/t patient position Cerebellar: unable to obtain   Lab  Results: Basic Metabolic Panel: Recent Labs  Lab 11/29/19 1048 11/30/19 0639  NA 140 141  K 4.6 3.9  CL 102 100  CO2 28 29  GLUCOSE 103* 105*  BUN 15 10  CREATININE 1.14 0.94  CALCIUM 8.9 8.8*    CBC: Recent Labs  Lab 11/29/19 1048 11/30/19 0639  WBC 7.9 8.8  NEUTROABS 6.8  --   HGB 12.6* 13.7  HCT 40.8 43.5  MCV 101.0* 100.2*  PLT 255 269   Imaging: CT ANGIO NECK W OR WO CONTRAST  Addendum Date: 11/30/2019   ADDENDUM REPORT: 11/30/2019 18:17 ADDENDUM: Study discussed by telephone with Dr. Rickey BarbaraSTEPHEN CHIU on  11/30/2019 at 1808 hours. Electronically Signed   By: Odessa Fleming M.D.   On: 11/30/2019 18:17   Result Date: 11/30/2019 CLINICAL DATA:  54 year old male with recent seizure, fall, head injury. EXAM: CT ANGIOGRAPHY NECK TECHNIQUE: Multidetector CT imaging of the neck was performed using the standard protocol during bolus administration of intravenous contrast. Multiplanar CT image reconstructions and MIPs were obtained to evaluate the vascular anatomy. Carotid stenosis measurements (when applicable) are obtained utilizing NASCET criteria, using the distal internal carotid diameter as the denominator. CONTRAST:  38mL OMNIPAQUE IOHEXOL 350 MG/ML SOLN COMPARISON:  CT head and cervical spine yesterday. CTA chest 09/16/2019. FINDINGS: Skeleton: Very severe chronic cervical spine degeneration, including degenerative appearing widening of the atlanto dens interval. There is also severe joint degeneration at the left occiput-C1 articulation. Diffuse cervical spinal stenosis, which could be moderate or severe at several levels (C3-C4 series 4, image 87). No acute osseous abnormality identified. Upper chest: Negative upper chest aside from what is probably dependent atelectasis in the left lung. Other neck: No acute neck soft tissue findings. Negative visible brain parenchyma. Aortic arch: 3 vessel arch configuration with no arch atherosclerosis. Right carotid system: Negative right CCA and right  carotid bifurcation. There is irregularity due to soft plaque at the level of the distal right ICA bulb, where the vessel rapidly tapers (series 8, image 97) and remains diminutive than to the skull base. The visible right ICA siphon demonstrates only thread-like enhancement (series 4, image 8), and the anterior genu may be occluded at the level of calcified plaque, uncertain. Left carotid system: Negative left CCA, left carotid bifurcation and cervical left ICA. Visible left ICA siphon is patent with mild supraclinoid calcified plaque. Vertebral arteries: Proximal right subclavian artery and right vertebral artery origin are patent without stenosis. The right vertebral appears non dominant but is patent to the vertebrobasilar junction. The right V4 segment is diminutive beyond the right PICA origin. Negative visible basilar artery. Proximal left subclavian artery and left vertebral artery origin are normal. The left vertebral is dominant and patent to the basilar without stenosis. Normal left PICA origin. Review of the MIP images confirms the above findings IMPRESSION: 1. Abnormal, rapidly tapering Right ICA distal to the bulb, and only thread-like enhancement of the visible Right ICA siphon which is likely to be occluded or functionally occluded distally. 2. Negative left carotid and bilateral vertebral arteries (the left vertebral is dominant). 3. Very severe degeneration of the craniocervical junction and cervical spine, with suspected moderate or severe cervical spinal stenosis at multiple levels. Electronically Signed: By: Odessa Fleming M.D. On: 11/30/2019 17:55   EEG adult  Result Date: 11/29/2019 Charlsie Quest, MD     11/29/2019  4:49 PM Patient Name: Travaughn Vue MRN: [id number] Epilepsy Attending: Charlsie Quest Referring Physician/Provider: Dr. Jonah Blue Date: 11/29/2019 Duration: 25.25 mins Patient history: 54 year old male with multiple syncopal episodes.  EEG evaluate for seizures. Level of  alertness: Awake AEDs during EEG study: None Technical aspects: This EEG study was done with scalp electrodes positioned according to the 10-20 International system of electrode placement. Electrical activity was acquired at a sampling rate of 500Hz  and reviewed with a high frequency filter of 70Hz  and a low frequency filter of 1Hz . EEG data were recorded continuously and digitally stored. Description: During awake state, no clear posterior dominant rhythm was seen.  EEG showed continuous generalized, maximal bifrontal 3 to 6 Hz theta-delta slowing.  Hyperventilation and photic stimulation were not performed. Abnormality -Continuous slow,  generalized IMPRESSION: This study is suggestive of moderate diffuse encephalopathy, nonspecific to etiology. No seizures or epileptiform discharges were seen throughout the recording. Priyanka O Yadav   VAS US CAROTID  Result Date: 12/01/2019 Carotid Arterial Duplex Study Indications:       Syncope and Down's Syndrome. Risk Factors:      Hyperlipidemia. Limitations        Today's exam was limited due to the patient's inability or                    unwillingness to cooperate and the body habitus of the                    patient. Comparison Study:  No prior study on file Performing Technologist: Sherren Kerns RVS  Examination Guidelines: A complete evaluation includes B-mode imaging, spectral Doppler, color Doppler, and power Doppler as needed of all accessible portions of each vessel. Bilateral testing is considered an integral part of a complete examination. Limited examinations for reoccurring indications may be performed as noted.  Right Carotid Findings: +----------+--------+--------+--------+------------------+---------------+           PSV cm/sEDV cm/sStenosisPlaque DescriptionComments        +----------+--------+--------+--------+------------------+---------------+ CCA Prox  87      12              homogeneous                        +----------+--------+--------+--------+------------------+---------------+ CCA Distal83      7               homogeneous                       +----------+--------+--------+--------+------------------+---------------+ ICA Prox  11      3       Occluded                  Trickle flow    +----------+--------+--------+--------+------------------+---------------+ ICA Mid   27                                                        +----------+--------+--------+--------+------------------+---------------+ ICA Distal55      24                                                +----------+--------+--------+--------+------------------+---------------+ ECA       61      4                                 spiked waveform +----------+--------+--------+--------+------------------+---------------+ +---------+--------+--+--------+-+---------------+ VertebralPSV cm/s52EDV cm/s7Spiked waveform +---------+--------+--+--------+-+---------------+  Left Carotid Findings: +---------+--------+--+--------+--+--------+ VertebralPSV cm/s92EDV cm/s38Atypical +---------+--------+--+--------+--+--------+   Summary: Right Carotid: Evidence consistent with a total occlusion of the right ICA. Left Carotid: Unable to accurately evaluate the left Carotid arteries secondary               to patient's inability to cooperate and tortuosity of vessels. Vertebrals:  Atypical waveforms bilaterally. Subclavians: Bilateral subclavian arteries were not visualized. *See table(s) above for measurements and observations.  Electronically signed by Delia Heady MD  on 12/01/2019 at 11:33:12 AM.    Final     Assessment:  With PMH Trisomy 21 admitted after being found passed out on the floor. Was admitted in January also for syncope. Work-up for syncope including EEG which was negative for epileptiform discharges and carotid dopplers showed right ICA occlusion/stenosis.  Neurology consulted to evaluate whether ICA  occlusion responsible for syncopal events.   Laurey Morale, MSN, NP-C [facility] [phone]  Attending neurologist's note to follow   12/01/2019, 12:21 PM  NEUROHOSPITALIST ADDENDUM Performed a face to face diagnostic evaluation.   I have reviewed the contents of history and physical exam as documented by PA/ARNP/Resident and agree with above documentation.  I have discussed and formulated the above plan as documented. Edits to the note have been made as needed.  54 year old male with Down syndrome presents with 3 episodes of passing out in the last 3 months.  First time in January work-up included telemetry which was negative for arrhythmias, however noted to have B12 deficiency and iron deficiency anemia.  Patient also presented in February in the emergency department-noted to have contusion in the head as well as some oral bleeding and tongue abrasion.  Patient was discharged after returning to baseline.  CT head showed no acute pathology. Presents the third time with similar history and therefore admitted for further evaluation.  This time EEG was performed which is negative for epileptiform discharges.  Also carotid Doppler was ordered and attempted, however patient did not fully cooperate and therefore CT angiogram was performed which showed rapidly tapered right ICA concerning for near occlusion versus occlusion.  Patient not cooperating to obtain orthostatics.  Impression -Recurrent syncope, likely vasovagal since all 3 episodes occurred in the bathroom. 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 Donzetta Matters, felt that narrowing was significant and likely chronic.  Agree that there is no role in opening chronically occluded  carotid, and unclear if this is symptomatic.  However may be reasonable to do diagnostic angiogram to see if this is chronically occluded or not.  Would recommend repeating B12 and performing orthostatics and making sure there is no simpler explanation for patient's recurrent syncopal episodes.   Karena Addison Shawnie Nicole MD [facility] [phone]   If 7pm to 7am, please call on call as listed on AMION.

## 2019-12-02 ENCOUNTER — Inpatient Hospital Stay (HOSPITAL_COMMUNITY): Payer: Medicare Other

## 2019-12-02 DIAGNOSIS — Q909 Down syndrome, unspecified: Secondary | ICD-10-CM | POA: Diagnosis not present

## 2019-12-02 DIAGNOSIS — E781 Pure hyperglyceridemia: Secondary | ICD-10-CM

## 2019-12-02 DIAGNOSIS — R55 Syncope and collapse: Secondary | ICD-10-CM | POA: Diagnosis not present

## 2019-12-02 LAB — LIPID PANEL
Cholesterol: 133 mg/dL (ref 0–200)
HDL: 42 mg/dL (ref >40)
LDL Cholesterol: 77 mg/dL (ref 0–99)
Total CHOL/HDL Ratio: 3.2 RATIO
Triglycerides: 69 mg/dL (ref <150)
VLDL: 14 mg/dL (ref 0–40)

## 2019-12-02 MED ORDER — ASPIRIN EC 81 MG PO TBEC
81.0000 mg | DELAYED_RELEASE_TABLET | Freq: Every day | ORAL | Status: DC
  Administered 2019-12-02 – 2019-12-06 (×4): 81 mg via ORAL
  Filled 2019-12-02 (×6): qty 1

## 2019-12-02 MED ORDER — IOHEXOL 350 MG/ML SOLN
75.0000 mL | Freq: Once | INTRAVENOUS | Status: AC | PRN
  Administered 2019-12-02: 75 mL via INTRAVENOUS

## 2019-12-02 MED ORDER — CLOPIDOGREL BISULFATE 75 MG PO TABS
75.0000 mg | ORAL_TABLET | Freq: Every day | ORAL | Status: DC
  Administered 2019-12-02 – 2019-12-06 (×4): 75 mg via ORAL
  Filled 2019-12-02 (×5): qty 1

## 2019-12-02 NOTE — Progress Notes (Signed)
PROGRESS NOTE    Tracy Todd  CBU:[id number] DOB: [birth year] DOA: 11/29/2019 PCP: Chesley Noon, MD    Brief Narrative:  [phone] who presents with recurrent syncope. Admitted for further work up  Assessment & Plan:   Principal Problem:   Syncope Active Problems:   Down syndrome   Hyperlipidemia  Syncope -Attempted B carotid dopplers, only tolerated one side. Per [place] tech, concern for near total occlusion, however unable to assess other side as pt not cooperating with exam -CTA neck results with findings concerning for R ICA occlusion distal to the bulb -Have consulted Vascular Surgery as well as Neurology. 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 and unlikely to cause recent episodes of syncope -Neurology following, awaiting recommendations, however pt is scheduled for CTA head/neck and was started on ASA and plavix ordered by Neurology -EEG reviewed, no seizures noted -Therapy recs for HHPT and rolling walker  Down syndrome -Patient with chronic intellectual disability -He may have had recent personality change -neurology consulted, per above -recommend outpt f/u  HLD -Continue Pravachol as toelrated   DVT prophylaxis: Lovenox subq Code Status: DNR Family Communication: Pt in room, family at bedside  Status is: Inpatient  Remains inpatient appropriate because:Ongoing diagnostic testing needed not appropriate for outpatient work up   Dispo: The patient is from: Group home              Anticipated d/c is to: Group home              Anticipated d/c date is: 1 day              Patient currently is not medically stable to d/c.        Consultants:   Neurology  Vascular Surgery  Procedures:     Antimicrobials: Anti-infectives (From admission, onward)   None      Subjective: Asleep this AM, arouses easily but did not converse  Objective: Vitals:   12/01/19 1751 12/01/19 2024 12/02/19 0500 12/02/19 0812  BP: 115/80 116/66  125/85    Pulse: 70 75  86  Resp: 17 18    Temp: 98.1 F (36.7 C) 98.1 F (36.7 C)  (!) 97.4 F (36.3 C)  TempSrc:      SpO2:  96%  96%  Weight:   55.7 kg    No intake or output data in the 24 hours ending 12/02/19 1341 Filed Weights   12/01/19 0500 12/02/19 0500  Weight: 56.2 kg 55.7 kg    Examination: General exam: asleep, arousable, in no acute distress Respiratory system: normal chest rise, clear, no audible wheezing Cardiovascular system: regular rhythm, s1-s2 Gastrointestinal system: Nondistended, nontender, pos BS Central nervous system: No seizures, no tremors Extremities: No cyanosis, no joint deformities Skin: No rashes, no pallor Psychiatry: Unable to assess given mentation  Data Reviewed: I have personally reviewed following labs and imaging studies  CBC: Recent Labs  Lab 11/29/19 1048 11/30/19 0639  WBC 7.9 8.8  NEUTROABS 6.8  --   HGB 12.6* 13.7  HCT 40.8 43.5  MCV 101.0* 100.2*  PLT 255 224   Basic Metabolic Panel: Recent Labs  Lab 11/29/19 1048 11/30/19 0639  NA 140 141  K 4.6 3.9  CL 102 100  CO2 28 29  GLUCOSE 103* 105*  BUN 15 10  CREATININE 1.14 0.94  CALCIUM 8.9 8.8*   GFR: CrCl cannot be calculated (Unknown ideal weight.). Liver Function Tests: No results for input(s): AST, ALT, ALKPHOS, BILITOT, PROT, ALBUMIN in the last  168 hours. No results for input(s): LIPASE, AMYLASE in the last 168 hours. No results for input(s): AMMONIA in the last 168 hours. Coagulation Profile: No results for input(s): INR, PROTIME in the last 168 hours. Cardiac Enzymes: No results for input(s): CKTOTAL, CKMB, CKMBINDEX, TROPONINI in the last 168 hours. BNP (last 3 results) No results for input(s): PROBNP in the last 8760 hours. HbA1C: No results for input(s): HGBA1C in the last 72 hours. CBG: No results for input(s): GLUCAP in the last 168 hours. Lipid Profile: Recent Labs    12/02/19 1006  CHOL 133  HDL 42  LDLCALC 77  TRIG 69  CHOLHDL 3.2    Thyroid Function Tests: Recent Labs    11/29/19 1633  TSH 1.821   Anemia Panel: No results for input(s): VITAMINB12, FOLATE, FERRITIN, TIBC, IRON, RETICCTPCT in the last 72 hours. Sepsis Labs: No results for input(s): PROCALCITON, LATICACIDVEN in the last 168 hours.  Recent Results (from the past 240 hour(s))  SARS CORONAVIRUS 2 (TAT 6-24 HRS) Nasopharyngeal Nasopharyngeal Swab     Status: None   Collection Time: 11/29/19  3:13 PM   Specimen: Nasopharyngeal Swab  Result Value Ref Range Status   SARS Coronavirus 2 NEGATIVE NEGATIVE Final    Comment: (NOTE) SARS-CoV-2 target nucleic acids are NOT DETECTED. The SARS-CoV-2 RNA is generally detectable in upper and lower respiratory specimens during the acute phase of infection. Negative results do not preclude SARS-CoV-2 infection, do not rule out co-infections with other pathogens, and should not be used as the sole basis for treatment or other patient management decisions. Negative results must be combined with clinical observations, patient history, and epidemiological information. The expected result is Negative. Fact Sheet for Patients: HairSlick.no Fact Sheet for Healthcare Providers: quierodirigir.com This test is not yet approved or cleared by the [place] FDA and  has been authorized for detection and/or diagnosis of SARS-CoV-2 by FDA under an Emergency Use Authorization (EUA). This EUA will remain  in effect (meaning this test can be used) for the duration of the COVID-19 declaration under Section 56 4(b)(1) of the Act, 21 U.S.C. section 360bbb-3(b)(1), unless the authorization is terminated or revoked sooner. Performed at [facility], [address] [place], [place] [address]      Radiology Studies: CT ANGIO NECK W OR WO CONTRAST  Addendum Date: 11/30/2019   ADDENDUM REPORT: 11/30/2019 18:17 ADDENDUM: Study discussed by telephone with Dr. Rickey Barbara on 11/30/2019 at 1808 hours. Electronically Signed   By: Odessa Fleming M.D.   On: 11/30/2019 18:17   Result Date: 11/30/2019 CLINICAL DATA:  54 year old male with recent seizure, fall, head injury. EXAM: CT ANGIOGRAPHY NECK TECHNIQUE: Multidetector CT imaging of the neck was performed using the standard protocol during bolus administration of intravenous contrast. Multiplanar CT image reconstructions and MIPs were obtained to evaluate the vascular anatomy. Carotid stenosis measurements (when applicable) are obtained utilizing NASCET criteria, using the distal internal carotid diameter as the denominator. CONTRAST:  62mL OMNIPAQUE IOHEXOL 350 MG/ML SOLN COMPARISON:  CT head and cervical spine yesterday. CTA chest 09/16/2019. FINDINGS: Skeleton: Very severe chronic cervical spine degeneration, including degenerative appearing widening of the atlanto dens interval. There is also severe joint degeneration at the left occiput-C1 articulation. Diffuse cervical spinal stenosis, which could be moderate or severe at several levels (C3-C4 series 4, image 87). No acute osseous abnormality identified. Upper chest: Negative upper chest aside from what is probably dependent atelectasis in the left lung. Other neck: No acute neck soft  tissue findings. Negative visible brain parenchyma. Aortic arch: 3 vessel arch configuration with no arch atherosclerosis. Right carotid system: Negative right CCA and right carotid bifurcation. There is irregularity due to soft plaque at the level of the distal right ICA bulb, where the vessel rapidly tapers (series 8, image 97) and remains diminutive than to the skull base. The visible right ICA siphon demonstrates only thread-like enhancement (series 4, image 8), and the anterior genu may be occluded at the level of calcified plaque, uncertain. Left carotid system: Negative left CCA, left carotid bifurcation and cervical left ICA. Visible left ICA siphon is patent with mild supraclinoid calcified  plaque. Vertebral arteries: Proximal right subclavian artery and right vertebral artery origin are patent without stenosis. The right vertebral appears non dominant but is patent to the vertebrobasilar junction. The right V4 segment is diminutive beyond the right PICA origin. Negative visible basilar artery. Proximal left subclavian artery and left vertebral artery origin are normal. The left vertebral is dominant and patent to the basilar without stenosis. Normal left PICA origin. Review of the MIP images confirms the above findings IMPRESSION: 1. Abnormal, rapidly tapering Right ICA distal to the bulb, and only thread-like enhancement of the visible Right ICA siphon which is likely to be occluded or functionally occluded distally. 2. Negative left carotid and bilateral vertebral arteries (the left vertebral is dominant). 3. Very severe degeneration of the craniocervical junction and cervical spine, with suspected moderate or severe cervical spinal stenosis at multiple levels. Electronically Signed: By: Odessa Fleming M.D. On: 11/30/2019 17:55   VAS US CAROTID  Result Date: 12/01/2019 Carotid Arterial Duplex Study Indications:       Syncope and Down's Syndrome. Risk Factors:      Hyperlipidemia. Limitations        Today's exam was limited due to the patient's inability or                    unwillingness to cooperate and the body habitus of the                    patient. Comparison Study:  No prior study on file Performing Technologist: Sherren Kerns RVS  Examination Guidelines: A complete evaluation includes B-mode imaging, spectral Doppler, color Doppler, and power Doppler as needed of all accessible portions of each vessel. Bilateral testing is considered an integral part of a complete examination. Limited examinations for reoccurring indications may be performed as noted.  Right Carotid Findings: +----------+--------+--------+--------+------------------+---------------+             PSV cm/s EDV  cm/s Stenosis Plaque Description Comments         +----------+--------+--------+--------+------------------+---------------+  CCA Prox   87       12                homogeneous                         +----------+--------+--------+--------+------------------+---------------+  CCA Distal 83       7                 homogeneous                         +----------+--------+--------+--------+------------------+---------------+  ICA Prox   11       3        Occluded  Trickle flow     +----------+--------+--------+--------+------------------+---------------+  ICA Mid    27                                                             +----------+--------+--------+--------+------------------+---------------+  ICA Distal 55       24                                                    +----------+--------+--------+--------+------------------+---------------+  ECA        61       4                                    spiked waveform  +----------+--------+--------+--------+------------------+---------------+ +---------+--------+--+--------+-+---------------+  Vertebral PSV cm/s 52 EDV cm/s 7 Spiked waveform  +---------+--------+--+--------+-+---------------+  Left Carotid Findings: +---------+--------+--+--------+--+--------+  Vertebral PSV cm/s 92 EDV cm/s 38 Atypical  +---------+--------+--+--------+--+--------+   Summary: Right Carotid: Evidence consistent with a total occlusion of the right ICA. Left Carotid: Unable to accurately evaluate the left Carotid arteries secondary               to patient's inability to cooperate and tortuosity of vessels. Vertebrals:  Atypical waveforms bilaterally. Subclavians: Bilateral subclavian arteries were not visualized. *See table(s) above for measurements and observations.  Electronically signed by Delia Heady MD on 12/01/2019 at 11:33:12 AM.    Final     Scheduled Meds:  aspirin EC  81 mg Oral Daily   celecoxib  100 mg Oral Daily   clopidogrel  75 mg Oral Daily     diclofenac Sodium  2 g Topical QID   enoxaparin (LOVENOX) injection  40 mg Subcutaneous Q24H   multivitamin with minerals  1 tablet Oral Daily   pravastatin  40 mg Oral q1800   sodium chloride flush  3 mL Intravenous Q12H   sucralfate  1 g Oral BID WC   Continuous Infusions:  lactated ringers 75 mL/hr at 12/02/19 1209     LOS: 2 days   Rickey Barbara, MD [facility] Pager On Amion  If 7PM-7AM, please contact night-coverage 12/02/2019, 1:41 PM

## 2019-12-02 NOTE — TOC Initial Note (Addendum)
Transition of Care [facility]) - Initial/Assessment Note    Patient Details  Name: Tracy Todd MRN: [id number] Date of Birth: 1965/08/25  [facility] CM/SW Contact:    Jacquelynn Cree Phone Number: 12/02/2019, 4:23 PM  Clinical Narrative:                 CSW received consult for possible SNF placement at time of discharge.  Patient is from a group home where he has lived for the past 30+ years. CSW spoke with Joylene Igo [phone], as well as the facility direct Clabe Seal [phone] and confirmed is not able to provide 24/7 supervision to patient once discharged from the hospital.  CSW met bedside with patient's father Tracy Todd regarding PT recommendation of 24/7 supervision and the possibility of SNF placement at time of discharge. Patient's father expressed understanding of PT recommendation and is agreeable to SNF placement at time of discharge.   Pasrr pending. No further questions reported at this time. CSW to continue to follow and assist with discharge planning needs.   Expected Discharge Plan: Skilled Nursing Facility Barriers to Discharge: Continued Medical Work up, [place] (PASRR)   Patient Goals and CMS Choice   CMS Medicare.gov Compare Post Acute Care list provided to:: Patient Represenative (must comment) Choice offered to / list presented to : Parent  Expected Discharge Plan and Services Expected Discharge Plan: [place] arrangements for the past 2 months: Group Home                                      Prior Living Arrangements/Services Living arrangements for the past 2 months: Group Home Lives with:: Facility Resident Patient language and need for interpreter reviewed:: Yes Do you feel safe going back to the place where you live?: Yes      Need for Family Participation in Patient Care: Yes (Comment) Care giver support system in place?: Yes (comment)   Criminal Activity/Legal  Involvement Pertinent to Current Situation/Hospitalization: No - Comment as needed  Activities of Daily Living Home Assistive Devices/Equipment: Other (Comment) ADL Screening (condition at time of admission) Is the patient deaf or have difficulty hearing?: No Does the patient have difficulty seeing, even when wearing glasses/contacts?: No Does the patient have difficulty concentrating, remembering, or making decisions?: Yes Does the patient have difficulty dressing or bathing?: Yes Does the patient have difficulty walking or climbing stairs?: Yes  Permission Sought/Granted Permission sought to share information with : Facility Sport and exercise psychologist, Family Supports    Share Information with NAME: Tracy Todd  Permission granted to share info w AGENCY: SNFs  Permission granted to share info w Relationship: Father  Permission granted to share info w Contact Information: [phone]  Emotional Assessment   Attitude/Demeanor/Rapport: Unable to Assess Affect (typically observed): Unable to Assess Orientation: : Oriented to Self Alcohol / Substance Use: Not Applicable Psych Involvement: No (comment)  Admission diagnosis:  Down syndrome [Q90.9] Syncope [R55] Syncope, unspecified syncope type [R55] Patient Active Problem List   Diagnosis Date Noted  . Iron deficiency anemia 09/17/2019  . Elevated troponin 09/17/2019  . Syncope 09/16/2019  . Down syndrome 12/27/2012  . Arthritis 10/25/2011  . Hyperlipidemia 10/25/2011   PCP:  Chesley Noon, MD Pharmacy:   [facility], [address] [place] [address] Phone:  [phone] Fax: [phone]     Social Determinants of Health (SDOH) Interventions    Readmission Risk Interventions No flowsheet data found.

## 2019-12-02 NOTE — Plan of Care (Signed)

## 2019-12-02 NOTE — Progress Notes (Addendum)
STROKE TEAM PROGRESS NOTE   INTERVAL HISTORY Father and POA at bedside. Pt resident of group home x 30 years. Father recounted HPI.  I have personally reviewed history of presenting illness with the patient and father as well as reviewed electronic medical records and imaging films in PACS.  He has had 3 episodes of syncope in the last few months but there have been no focal recommend accompanying neurological symptoms.  CT angiogram and carotid ultrasound suggest right carotid occlusion.  Vitals:   12/01/19 1751 12/01/19 2024 12/02/19 0500 12/02/19 0812  BP: 115/80 116/66  125/85  Pulse: 70 75  86  Resp: 17 18    Temp: 98.1 F (36.7 C) 98.1 F (36.7 C)  (!) 97.4 F (36.3 C)  TempSrc:      SpO2:  96%  96%  Weight:   55.7 kg     CBC:  Recent Labs  Lab 11/29/19 1048 11/30/19 0639  WBC 7.9 8.8  NEUTROABS 6.8  --   HGB 12.6* 13.7  HCT 40.8 43.5  MCV 101.0* 100.2*  PLT 255 678    Basic Metabolic Panel:  Recent Labs  Lab 11/29/19 1048 11/30/19 0639  NA 140 141  K 4.6 3.9  CL 102 100  CO2 28 29  GLUCOSE 103* 105*  BUN 15 10  CREATININE 1.14 0.94  CALCIUM 8.9 8.8*   Lipid Panel:     Component Value Date/Time   CHOL 133 12/02/2019 1006   TRIG 69 12/02/2019 1006   HDL 42 12/02/2019 1006   CHOLHDL 3.2 12/02/2019 1006   VLDL 14 12/02/2019 1006   LDLCALC 77 12/02/2019 1006   HgbA1c: No results found for: HGBA1C Urine Drug Screen: No results found for: LABOPIA, COCAINSCRNUR, LABBENZ, AMPHETMU, THCU, LABBARB  Alcohol Level No results found for: ETH  IMAGING past 24 hours CT ANGIO HEAD W OR WO CONTRAST  Result Date: 12/02/2019 CLINICAL DATA:  Transient ischemic attack (TIA). EXAM: CT ANGIOGRAPHY HEAD TECHNIQUE: Multidetector CT imaging of the head was performed using the standard protocol during bolus administration of intravenous contrast. Multiplanar CT image reconstructions and MIPs were obtained to evaluate the vascular anatomy. CONTRAST:  92mL OMNIPAQUE IOHEXOL 350  MG/ML SOLN COMPARISON:  CT angiogram neck 11/30/2019, head CT 11/29/2019. FINDINGS: CT HEAD Brain: There is no evidence of acute intracranial hemorrhage, intracranial mass, midline shift or extra-axial fluid collection.No demarcated cortical infarction. Stable, moderate generalized parenchymal atrophy with associated prominence of ventricles and sulci. Vascular: Reported below. Skull: Normal. Negative for fracture or focal lesion. Sinuses: No significant paranasal sinus disease or mastoid effusion at the imaged levels. Orbits: Visualized orbits demonstrate no acute abnormality. CTA HEAD Anterior circulation: There is a markedly diminutive appearance of the visualized cervical ICA and right ICA siphon. There is prominent calcified plaque within the distal cavernous right ICA with severe stenosis. Distal to this, there is calcified plaque with focal occlusion or near occlusion of the para clinoid/supraclinoid right ICA. There is reconstitution of the supraclinoid right ICA more distally. The intracranial left internal carotid artery is patent. Calcified plaque within the paraclinoid left ICA with mild stenosis. The M1 middle cerebral arteries are patent without significant stenosis. No M2 proximal branch occlusion or high-grade proximal stenosis is identified. The anterior cerebral arteries are patent without significant proximal stenosis. No intracranial aneurysm is identified. Posterior circulation: The intracranial right vertebral artery is developmentally diminutive, but patent. The dominant intracranial left vertebral artery is patent without significant stenosis. The basilar artery is patent without significant stenosis.  The posterior cerebral arteries are patent without significant proximal stenosis. Small posterior communicating arteries are present bilaterally. Venous sinuses: Poorly assessed due to contrast timing. Anatomic variants: As described IMPRESSION: CT head: 1. Unremarkable non-contrast CT  appearance of the brain. No evidence of acute intracranial abnormality. 2. Stable, moderate generalized parenchymal atrophy. CTA head: 1. Markedly diminutive appearance of the visualized cervical right ICA and right ICA siphon. Calcified plaque within the distal cavernous right ICA with resultant severe stenosis. Calcified plaque within the paraclinoid/supraclinoid right ICA with focal occlusive or near-occlusive stenosis at this site. There is reconstitution of the supraclinoid right ICA more distally. 2. No intracranial large vessel occlusion or high-grade proximal arterial stenosis elsewhere. Electronically Signed   By: Jackey Loge DO   On: 12/02/2019 14:10    PHYSICAL EXAM Pleasant middle-age Caucasian male with distinct Down syndrome faces. . Afebrile. Head is nontraumatic. Neck is supple without bruit.    Cardiac exam no murmur or gallop. Lungs are clear to auscultation. Distal pulses are well felt. Neurological Exam : Patient is awake alert he has soft voice and hypophonia but cannot speak few words and short sentences.  Follows simple midline and one-step commands only.  Extraocular movements are full range without nystagmus.  Blinks to threat bilaterally.  Face is symmetric without weakness.  Tongue midline.  Motor system exam symmetric upper and lower extremity strength though he moves lower extremity less than upper extremity.  He complains of pain with passive range of motion.  Sensation appears preserved bilaterally.  Reflexes are symmetric.  Gait not tested ASSESSMENT/PLAN [name] is a 54 y.o. male with history of Downs Syndrome presenting with multiple syncopal episodes. Found to have R ICA occlusion.   Syncope in setting of R Carotid Occlusion while toileting  CT head No acute abnormality. Small vessel disease. Atrophy.  CT CS no acute abnormality     CTA  neck R ICA thread-like enhancement, w/ likely siphon occlusion. L ICA ok. B VA ok. Severe degeneration of craniocervical  jxn and CS, likely nod or severe CS stenosis at multiple levels.  CT head no acute abnormality  CTA head markedly small cervical R ICA / R ICA siphon. Plaque in distal cavernous R ICA w/ severe stenosis. Calcified plaque in paraclinoid/supraclinoid R ICA w/ occlusion / near occlusion. No intracranial LVO or high-grade stenosis elsewhere.   Carotid Doppler  R ICA occlusion, unable to fully evaluate L ICA d/t lack of pt cooperation annd tortuosity  2D Echo EF 60-65. No source of embolus. Grade II DD.    EEG neg for sz  LDL 77   HgbA1c pending   Lovenox 40 mg sq daily for VTE prophylaxis  No antithrombotic prior to admission, now on aspirin 81 mg daily and clopidogrel 75 mg daily. Continue DAPT x 3 MONTHS then aspirin alone     VVS consulted. Occlusion felt to be chronic. No indication for intervention.  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  I agree he is not a candidate for revascularization at the present time but check CT angiogram of the brain to look for intracranial vasculature as well as check lipid profile hemoglobin A1c.  Recommend aspirin and Plavix for 3 months followed by aspirin alone.  Aggressive risk factor modification.  Long discussion with patient and father and answered questions.  Greater than 50% time during this 35-minute visit was spent in counseling and coordination of care about his syncope and carotid occlusion and answering questions.  Discussed with Dr. Deno Etienne. Stroke team will sign off. Call for questions. Delia Heady, MD To contact Stroke Continuity provider, please refer to [email or web address]. After hours, contact General Neurology

## 2019-12-03 ENCOUNTER — Encounter (HOSPITAL_COMMUNITY): Payer: Self-pay | Admitting: Internal Medicine

## 2019-12-03 DIAGNOSIS — E781 Pure hyperglyceridemia: Secondary | ICD-10-CM | POA: Diagnosis not present

## 2019-12-03 DIAGNOSIS — Q909 Down syndrome, unspecified: Secondary | ICD-10-CM | POA: Diagnosis not present

## 2019-12-03 LAB — HEMOGLOBIN A1C
Hgb A1c MFr Bld: 5.3 % (ref 4.8–5.6)
Mean Plasma Glucose: 105 mg/dL

## 2019-12-03 NOTE — Progress Notes (Signed)
Occupational Therapy Treatment Patient Details Name: Tracy Todd MRN: [id number] DOB: 12/23/1965 Today's Date: 12/03/2019    History of present illness 54 y.o. male with medical history significant of Down syndrome and seizures presenting with syncope (approx 3 separate episodes per report from group home). EEG completed and suggestive of moderate diffuse encephalopathy, nonspecific to etiology; No seizures or epileptiform discharges were seen throughout the recording.   OT comments  Cotx with PT to maximize pt safety and functional performance. Pt resistant to mobility requiring max encouragement throughout to engage in therapy tasks. Pt tolerated sitting EOB 25 min with variable mod to max assist for balance, noting continuous left lateral lean. Pt able to reach and center self near midline when attempting to don socks with figure four position. Pt continually declined assist from therapist when attempting to help reposition EOB or assist with transfer tasks. Pt able to pivot to bedside chair with max assist. Pt required significant increase in time to complete all tasks and cues to initiate due to cognitive deficits at baseline. Per family present, pt unable to discharge back to group home unless pt can ambulate. Per SW note, group home unable to provide 24/7 assist to pt. Pt would likely perform better in familiar environment, however currently unable to provide level of assistance that pt requires.   Follow Up Recommendations  SNF;Supervision/Assistance - 24 hour(Group home unable to provide 24/7 assist)    Equipment Recommendations  3 in 1 bedside commode;Wheelchair (measurements OT);Wheelchair cushion (measurements OT)(w/c pending progress with mobility)    Recommendations for Other Services      Precautions / Restrictions Precautions Precautions: Fall Precaution Comments: hx of syncopy. Down syndrome Restrictions Weight Bearing Restrictions: No       Mobility Bed Mobility Overal  bed mobility: Needs Assistance Bed Mobility: Supine to Sit     Supine to sit: Max assist;+2 for physical assistance     General bed mobility comments: Pt initially resistant to therapists attempt to mobilize. Max encouragement throughout. Increased time to complete. Pt does not initiate tasks.   Transfers Overall transfer level: Needs assistance Equipment used: None Transfers: Sit to/from [employer] Sit to Stand: Max assist Stand pivot transfers: Max assist       General transfer comment: Max encouragement. Resistant to mobility. Pt able to bear weight through BLEs during transfer.     Balance Overall balance assessment: Needs assistance Sitting-balance support: Feet supported Sitting balance-Leahy Scale: Poor Sitting balance - Comments: Pt tolerated sitting EOB 25 min with variable mod to max assist. Left lateral lean throughout. Pt able to reach and center self near midline when attempting to don socks with figure four position.  Postural control: Left lateral lean   Standing balance-Leahy Scale: Poor                             ADL either performed or assessed with clinical judgement   ADL Overall ADL's : Needs assistance/impaired                     Lower Body Dressing: Moderate assistance;Sitting/lateral leans Lower Body Dressing Details (indicate cue type and reason): While seated EOB. Pt able to don RLE sock with min assist and LLE sock with max assist. Pt utilized figure four position requiring assist to hold position. Pt required max encouragement and increased time to complete task.              Functional  mobility during ADLs: Maximal assistance General ADL Comments: Pt tolerated sitting EOB 25 min with variable mod to max assist for balance. Pt transferred to bedside chair via stand pivot max assist. Pt resistive to therapists attempts to assist with pt requiring increased time to complete all tasks.      Vision        Perception     Praxis      Cognition Arousal/Alertness: Awake/alert Behavior During Therapy: Flat affect Overall Cognitive Status: History of cognitive impairments - at baseline                                 General Comments: pt with history of downs syndrome, does not follow commands this session, resistive to therapist's attempts to mobilize         Exercises     Shoulder Instructions       General Comments Pt's family present for part of session.     Pertinent Vitals/ Pain       Pain Assessment: Faces Faces Pain Scale: Hurts even more Pain Location: BLEs Pain Descriptors / Indicators: Discomfort;Grimacing Pain Intervention(s): Limited activity within patient's tolerance;Monitored during session;Repositioned  Home Living                                          Prior Functioning/Environment              Frequency           Progress Toward Goals  OT Goals(current goals can now be found in the care plan section)  Progress towards OT goals: Progressing toward goals  ADL Goals Pt Will Perform Grooming: with min assist;sitting Pt Will Perform Lower Body Dressing: with mod assist;with caregiver independent in assisting;sitting/lateral leans;sit to/from stand Additional ADL Goal #1: Pt will participate in EOB/OOB activity > 57min with mod cueing.  Plan Discharge plan needs to be updated    Co-evaluation    PT/OT/SLP Co-Evaluation/Treatment: Yes Reason for Co-Treatment: Necessary to address cognition/behavior during functional activity;For patient/therapist safety;To address functional/ADL transfers   OT goals addressed during session: ADL's and self-care      AM-PAC OT "6 Clicks" Daily Activity     Outcome Measure   Help from another person eating meals?: Total Help from another person taking care of personal grooming?: Total Help from another person toileting, which includes using toliet, bedpan, or urinal?:  Total Help from another person bathing (including washing, rinsing, drying)?: Total Help from another person to put on and taking off regular upper body clothing?: Total Help from another person to put on and taking off regular lower body clothing?: A Lot 6 Click Score: 7    End of Session    OT Visit Diagnosis: Other abnormalities of gait and mobility (R26.89);Other symptoms and signs involving cognitive function;History of falling (Z91.81)   Activity Tolerance Other (comment);Patient limited by fatigue(Limited due to cognitive deficits. )   Patient Left in chair;with call bell/phone within reach;with chair alarm set;with family/visitor present   Nurse Communication Mobility status        Time: 0258-5277 OT Time Calculation (min): 47 min  Charges: OT General Charges $OT Visit: 1 Visit OT Treatments $Self Care/Home Management : 8-22 mins  Mauri Brooklyn OTR/L [phone]   Mauri Brooklyn 12/03/2019, 3:30 PM

## 2019-12-03 NOTE — Progress Notes (Addendum)
PROGRESS NOTE    Oz Gammel  HAL:[id number] DOB: [birth year] DOA: 11/29/2019 PCP: Eartha Inch, MD    Brief Narrative:  [phone] who presents with recurrent syncope. Admitted for further work up  Assessment & Plan:   Principal Problem:   Syncope Active Problems:   Down syndrome   Hyperlipidemia  Syncope -Attempted B carotid dopplers, only tolerated one side. Per [place] tech, concern for near total occlusion, however unable to assess other side as pt not cooperating with exam -CTA neck results with findings concerning for R ICA occlusion distal to the bulb -Have consulted Vascular Surgery as well as Neurology. 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 Group home unable to offer 24hr supervision, thus SNF is pending.  Down syndrome -Patient with chronic intellectual disability -He may have had recent personality change -neurology consulted, with recs per above -recommend outpt f/u  HLD -Continue Pravachol as toelrated   DVT prophylaxis: Lovenox subq Code Status: Discussed with pt's family, wishes are Full Code, not DNR Family Communication: Pt in room, family at bedside  Status is: Inpatient  Remains inpatient appropriate because:Unsafe d/c plan   Dispo: The patient is from: Group home              Anticipated d/c is to: SNF              Anticipated d/c date is: 2 days              Patient currently is not medically stable to d/c.        Consultants:   Neurology  Vascular Surgery  Procedures:     Antimicrobials: Anti-infectives (From admission, onward)   None      Subjective: Much more interactive. No  complaints  Objective: Vitals:   12/02/19 1620 12/02/19 2320 12/03/19 0500 12/03/19 0812  BP: 119/73 (!) 106/58  116/77  Pulse: 81 (!) 168  74  Resp:    18  Temp: 98.2 F (36.8 C) (!) 97.5 F (36.4 C)  97.8 F (36.6 C)  TempSrc:  Oral    SpO2: 96% (!) 81%  100%  Weight:   54 kg     Intake/Output Summary (Last 24 hours) at 12/03/2019 1359 Last data filed at 12/03/2019 0500 Gross per 24 hour  Intake 821.4 ml  Output 2000 ml  Net -1178.6 ml   Filed Weights   12/01/19 0500 12/02/19 0500 12/03/19 0500  Weight: 56.2 kg 55.7 kg 54 kg    Examination: General exam: Awake, sitting up in bed eating lunch Respiratory system: Normal respiratory effort, no wheezing Cardiovascular system: regular rate, s1, s2 Gastrointestinal system: Soft, nondistended, positive BS Central nervous system: CN2-12 grossly intact, strength intact Extremities: Perfused, no clubbing Skin: Normal skin turgor, no notable skin lesions seen Psychiatry: Mood normal // no visual hallucinations   Data Reviewed: I have personally reviewed following labs and imaging studies  CBC: Recent Labs  Lab 11/29/19 1048 11/30/19 0639  WBC 7.9 8.8  NEUTROABS 6.8  --   HGB 12.6* 13.7  HCT 40.8 43.5  MCV 101.0* 100.2*  PLT 255 269   Basic Metabolic Panel: Recent Labs  Lab 11/29/19 1048 11/30/19 0639  NA 140 141  K 4.6  3.9  CL 102 100  CO2 28 29  GLUCOSE 103* 105*  BUN 15 10  CREATININE 1.14 0.94  CALCIUM 8.9 8.8*   GFR: CrCl cannot be calculated (Unknown ideal weight.). Liver Function Tests: No results for input(s): AST, ALT, ALKPHOS, BILITOT, PROT, ALBUMIN in the last 168 hours. No results for input(s): LIPASE, AMYLASE in the last 168 hours. No results for input(s): AMMONIA in the last 168 hours. Coagulation Profile: No results for input(s): INR, PROTIME in the last 168 hours. Cardiac Enzymes: No results for input(s): CKTOTAL, CKMB, CKMBINDEX, TROPONINI in the last 168 hours. BNP (last 3  results) No results for input(s): PROBNP in the last 8760 hours. HbA1C: Recent Labs    12/02/19 1006  HGBA1C 5.3   CBG: No results for input(s): GLUCAP in the last 168 hours. Lipid Profile: Recent Labs    12/02/19 1006  CHOL 133  HDL 42  LDLCALC 77  TRIG 69  CHOLHDL 3.2   Thyroid Function Tests: No results for input(s): TSH, T4TOTAL, FREET4, T3FREE, THYROIDAB in the last 72 hours. Anemia Panel: No results for input(s): VITAMINB12, FOLATE, FERRITIN, TIBC, IRON, RETICCTPCT in the last 72 hours. Sepsis Labs: No results for input(s): PROCALCITON, LATICACIDVEN in the last 168 hours.  Recent Results (from the past 240 hour(s))  SARS CORONAVIRUS 2 (TAT 6-24 HRS) Nasopharyngeal Nasopharyngeal Swab     Status: None   Collection Time: 11/29/19  3:13 PM   Specimen: Nasopharyngeal Swab  Result Value Ref Range Status   SARS Coronavirus 2 NEGATIVE NEGATIVE Final    Comment: (NOTE) SARS-CoV-2 target nucleic acids are NOT DETECTED. The SARS-CoV-2 RNA is generally detectable in upper and lower respiratory specimens during the acute phase of infection. Negative results do not preclude SARS-CoV-2 infection, do not rule out co-infections with other pathogens, and should not be used as the sole basis for treatment or other patient management decisions. Negative results must be combined with clinical observations, patient history, and epidemiological information. The expected result is Negative. Fact Sheet for Patients: SugarRoll.be Fact Sheet for Healthcare Providers: https://www.woods-mathews.com/ This test is not yet approved or cleared by the [place] FDA and  has been authorized for detection and/or diagnosis of SARS-CoV-2 by FDA under an Emergency Use Authorization (EUA). This EUA will remain  in effect (meaning this test can be used) for the duration of the COVID-19 declaration under Section 56 4(b)(1) of the Act, 21 U.S.C. section  360bbb-3(b)(1), unless the authorization is terminated or revoked sooner. Performed at [facility], [place] [address] [place], [place] [address]      Radiology Studies: CT ANGIO HEAD W OR WO CONTRAST  Result Date: 12/02/2019 CLINICAL DATA:  Transient ischemic attack (TIA). EXAM: CT ANGIOGRAPHY HEAD TECHNIQUE: Multidetector CT imaging of the head was performed using the standard protocol during bolus administration of intravenous contrast. Multiplanar CT image reconstructions and MIPs were obtained to evaluate the vascular anatomy. CONTRAST:  55mL OMNIPAQUE IOHEXOL 350 MG/ML SOLN COMPARISON:  CT angiogram neck 11/30/2019, head CT 11/29/2019. FINDINGS: CT HEAD Brain: There is no evidence of acute intracranial hemorrhage, intracranial mass, midline shift or extra-axial fluid collection.No demarcated cortical infarction. Stable, moderate generalized parenchymal atrophy with associated prominence of ventricles and sulci. Vascular: Reported below. Skull: Normal. Negative for fracture or focal lesion. Sinuses: No significant paranasal sinus disease or mastoid effusion at the imaged levels. Orbits: Visualized orbits demonstrate no acute abnormality. CTA HEAD Anterior circulation: There is a markedly diminutive appearance of the visualized cervical ICA and  right ICA siphon. There is prominent calcified plaque within the distal cavernous right ICA with severe stenosis. Distal to this, there is calcified plaque with focal occlusion or near occlusion of the para clinoid/supraclinoid right ICA. There is reconstitution of the supraclinoid right ICA more distally. The intracranial left internal carotid artery is patent. Calcified plaque within the paraclinoid left ICA with mild stenosis. The M1 middle cerebral arteries are patent without significant stenosis. No M2 proximal branch occlusion or high-grade proximal stenosis is identified. The anterior cerebral arteries are patent without significant proximal stenosis.  No intracranial aneurysm is identified. Posterior circulation: The intracranial right vertebral artery is developmentally diminutive, but patent. The dominant intracranial left vertebral artery is patent without significant stenosis. The basilar artery is patent without significant stenosis. The posterior cerebral arteries are patent without significant proximal stenosis. Small posterior communicating arteries are present bilaterally. Venous sinuses: Poorly assessed due to contrast timing. Anatomic variants: As described IMPRESSION: CT head: 1. Unremarkable non-contrast CT appearance of the brain. No evidence of acute intracranial abnormality. 2. Stable, moderate generalized parenchymal atrophy. CTA head: 1. Markedly diminutive appearance of the visualized cervical right ICA and right ICA siphon. Calcified plaque within the distal cavernous right ICA with resultant severe stenosis. Calcified plaque within the paraclinoid/supraclinoid right ICA with focal occlusive or near-occlusive stenosis at this site. There is reconstitution of the supraclinoid right ICA more distally. 2. No intracranial large vessel occlusion or high-grade proximal arterial stenosis elsewhere. Electronically Signed   By: Jackey Loge DO   On: 12/02/2019 14:10    Scheduled Meds: . aspirin EC  81 mg Oral Daily  . celecoxib  100 mg Oral Daily  . clopidogrel  75 mg Oral Daily  . diclofenac Sodium  2 g Topical QID  . enoxaparin (LOVENOX) injection  40 mg Subcutaneous Q24H  . multivitamin with minerals  1 tablet Oral Daily  . pravastatin  40 mg Oral q1800  . sodium chloride flush  3 mL Intravenous Q12H  . sucralfate  1 g Oral BID WC   Continuous Infusions: . lactated ringers 75 mL/hr at 12/03/19 0222     LOS: 3 days   Rickey Barbara, MD [facility] Pager On Amion  If 7PM-7AM, please contact night-coverage 12/03/2019, 1:59 PM

## 2019-12-03 NOTE — NC FL2 (Addendum)
[place] LEVEL OF CARE SCREENING TOOL     IDENTIFICATION  Patient Name: Tracy Todd Birthdate: 29-Aug-1965 Sex: male Admission Date (Current Location): 11/29/2019  [facility] and [place] Number:  Herbalist and Address:  [facility], [place] [address], [place], [place] [address]      Provider Number: [id number]  Attending Physician Name and Address:  Donne Hazel, MD  Relative Name and Phone Number:       Current Level of Care: Hospital Recommended Level of Care: [place] Prior Approval Number:    Date Approved/Denied:   PASRR Number: pending  Discharge Plan: SNF    Current Diagnoses: Patient Active Problem List   Diagnosis Date Noted  . Iron deficiency anemia 09/17/2019  . Elevated troponin 09/17/2019  . Syncope 09/16/2019  . Down syndrome 12/27/2012  . Arthritis 10/25/2011  . Hyperlipidemia 10/25/2011    Orientation RESPIRATION BLADDER Height & Weight     Self  Normal Incontinent, External catheter Weight: 119 lb 0.8 oz (54 kg) Height:     BEHAVIORAL SYMPTOMS/MOOD NEUROLOGICAL BOWEL NUTRITION STATUS    Convulsions/Seizures Incontinent Diet(see discharge summary)  AMBULATORY STATUS COMMUNICATION OF NEEDS Skin   Total Care Non-Verbally Normal                       Personal Care Assistance Level of Assistance  Total care, Dressing, Feeding, Bathing Bathing Assistance: Maximum assistance Feeding assistance: Maximum assistance Dressing Assistance: Maximum assistance Total Care Assistance: Maximum assistance   Functional Limitations Info             SPECIAL CARE FACTORS FREQUENCY  PT (By licensed PT), OT (By licensed OT)     PT Frequency: 5 times a week OT Frequency: 5 times a week            Contractures      Additional Factors Info  Code Status, Allergies Code Status Info: DNR Allergies Info: Penicillins           Current Medications (12/03/2019):  This is the current  hospital active medication list Current Facility-Administered Medications  Medication Dose Route Frequency Provider Last Rate Last Admin  . acetaminophen (TYLENOL) tablet 650 mg  650 mg Oral Q6H PRN Karmen Bongo, MD   650 mg at 12/01/19 1554   Or  . acetaminophen (TYLENOL) suppository 650 mg  650 mg Rectal Q6H PRN Karmen Bongo, MD      . aspirin EC tablet 81 mg  81 mg Oral Daily Garvin Fila, MD   81 mg at 12/02/19 1037  . celecoxib (CELEBREX) capsule 100 mg  100 mg Oral Daily Karmen Bongo, MD   100 mg at 12/02/19 1037  . clopidogrel (PLAVIX) tablet 75 mg  75 mg Oral Daily Garvin Fila, MD   75 mg at 12/02/19 1037  . diclofenac Sodium (VOLTAREN) 1 % topical gel 2 g  2 g Topical QID Karmen Bongo, MD   2 g at 12/02/19 2146  . enoxaparin (LOVENOX) injection 40 mg  40 mg Subcutaneous Q24H Karmen Bongo, MD      . lactated ringers infusion   Intravenous Continuous Karmen Bongo, MD 75 mL/hr at 12/03/19 0222 New Bag at 12/03/19 0222  . loperamide (IMODIUM) capsule 2 mg  2 mg Oral BID PRN Karmen Bongo, MD      . LORazepam (ATIVAN) injection 1 mg  1 mg Intravenous PRN Donne Hazel, MD   1 mg at  11/30/19 1653  . multivitamin with minerals tablet 1 tablet  1 tablet Oral Daily Jonah Blue, MD   1 tablet at 12/02/19 1037  . ondansetron (ZOFRAN) tablet 4 mg  4 mg Oral Q6H PRN Jonah Blue, MD       Or  . ondansetron [facility]) injection 4 mg  4 mg Intravenous Q6H PRN Jonah Blue, MD      . pravastatin (PRAVACHOL) tablet 40 mg  40 mg Oral q1800 Jonah Blue, MD   40 mg at 12/02/19 1743  . sodium chloride flush (NS) 0.9 % injection 3 mL  3 mL Intravenous Q12H Jonah Blue, MD   3 mL at 12/02/19 2146  . sucralfate (CARAFATE) tablet 1 g  1 g Oral BID WC Jonah Blue, MD   1 g at 12/02/19 1743     Discharge Medications: Please see discharge summary for a list of discharge medications.  Relevant Imaging Results:  Relevant Lab Results:   Additional  Information   Patient will require a short-term nursing home stay - anticipated 30 days or less for rehabilitation and strengthening.   Truddie Hidden, LCSW

## 2019-12-03 NOTE — Care Management Important Message (Signed)
Important Message  Patient Details  Name: Tracy Todd MRN: [id number] Date of Birth: 1965-11-30   Medicare Important Message Given:  Yes     Ashlon Lottman Stefan Church 12/03/2019, 2:01 PM

## 2019-12-03 NOTE — Progress Notes (Signed)
Pt refused morning meds this morning. Multiple attempts made to administer, pt refused each attempt.

## 2019-12-03 NOTE — Progress Notes (Signed)
Pt attempting to pull out IV. 1 soft mitt applied to L hand per family request.

## 2019-12-03 NOTE — Progress Notes (Signed)
Physical Therapy Treatment Patient Details Name: Tracy Todd MRN: [id number] DOB: 09/27/1965 Today's Date: 12/03/2019    History of Present Illness 54 y.o. male with medical history significant of Down syndrome and seizures presenting with syncope (approx 3 separate episodes per report from group home). EEG completed and suggestive of moderate diffuse encephalopathy, nonspecific to etiology; No seizures or epileptiform discharges were seen throughout the recording.    PT Comments    Pt was shy or fearful and resistant initially, but a rapport was built and pt reluctantly participated.  Emphasis on transition to EOB, sitting balance, sitting task  (putting on his OWN socks), sit to stand and transfer to the recliner.    Follow Up Recommendations  Home health PT;Supervision/Assistance - 24 hour     Equipment Recommendations  Rolling walker with 5" wheels    Recommendations for Other Services       Precautions / Restrictions Precautions Precautions: Fall Precaution Comments: hx of syncopy. Down syndrome Restrictions Weight Bearing Restrictions: No    Mobility  Bed Mobility Overal bed mobility: Needs Assistance Bed Mobility: Supine to Sit     Supine to sit: Max assist;+2 for physical assistance     General bed mobility comments: Pt initially resistant to therapists attempt to mobilize. Max encouragement throughout. Increased time to complete. Pt does not initiate tasks.   Transfers Overall transfer level: Needs assistance Equipment used: None Transfers: Sit to/from [employer] Sit to Stand: Max assist Stand pivot transfers: Max assist       General transfer comment: Max encouragement. Resistant to mobility. Pt able to bear weight through BLEs during transfer.   Ambulation/Gait             General Gait Details: pt was still resistant today.  Will try again next session since there is a little rapport notw.   Stairs              Wheelchair Mobility    Modified Rankin (Stroke Patients Only)       Balance Overall balance assessment: Needs assistance Sitting-balance support: Feet supported Sitting balance-Leahy Scale: Poor Sitting balance - Comments: Pt tolerated sitting EOB 25 min with variable mod to max assist. Left lateral lean throughout. Pt able to reach and center self near midline when attempting to don socks with figure four position.  Postural control: Left lateral lean   Standing balance-Leahy Scale: Poor                              Cognition Arousal/Alertness: Awake/alert Behavior During Therapy: Flat affect Overall Cognitive Status: History of cognitive impairments - at baseline                                 General Comments: pt with history of downs syndrome, does not follow commands this session, resistive to therapist's attempts to mobilize       Exercises      General Comments General comments (skin integrity, edema, etc.): pt's family observed portions of the treatment, but felt pt would do better if they were not directly available      Pertinent Vitals/Pain Pain Assessment: Faces Faces Pain Scale: Hurts even more Pain Location: BLEs Pain Descriptors / Indicators: Discomfort;Grimacing Pain Intervention(s): Limited activity within patient's tolerance    Home Living  Prior Function            PT Goals (current goals can now be found in the care plan section) Acute Rehab PT Goals PT Goal Formulation: With patient/family Time For Goal Achievement: 12/14/19 Potential to Achieve Goals: Fair Progress towards PT goals: Progressing toward goals    Frequency    Min 3X/week      PT Plan Current plan remains appropriate    Co-evaluation PT/OT/SLP Co-Evaluation/Treatment: Yes Reason for Co-Treatment: Necessary to address cognition/behavior during functional activity;To address functional/ADL transfers PT  goals addressed during session: Mobility/safety with mobility OT goals addressed during session: ADL's and self-care      AM-PAC PT "6 Clicks" Mobility   Outcome Measure  Help needed turning from your back to your side while in a flat bed without using bedrails?: Total Help needed moving from lying on your back to sitting on the side of a flat bed without using bedrails?: Total Help needed moving to and from a bed to a chair (including a wheelchair)?: Total Help needed standing up from a chair using your arms (e.g., wheelchair or bedside chair)?: Total Help needed to walk in hospital room?: Total Help needed climbing 3-5 steps with a railing? : Total 6 Click Score: 6    End of Session   Activity Tolerance: Other (comment);Patient tolerated treatment well(pt was resistant to all new things.) Patient left: in chair;with call bell/phone within reach;with family/visitor present Nurse Communication: Mobility status PT Visit Diagnosis: Other abnormalities of gait and mobility (R26.89);Muscle weakness (generalized) (M62.81);Difficulty in walking, not elsewhere classified (R26.2)     Time: 1607-3710 PT Time Calculation (min) (ACUTE ONLY): 47 min  Charges:  $Therapeutic Activity: 23-37 mins                     12/03/2019  Ginger Carne., PT Acute Rehabilitation Services [phone]  (pager) [phone]  (office)   Tracy Todd 12/03/2019, 6:00 PM

## 2019-12-04 DIAGNOSIS — Q909 Down syndrome, unspecified: Secondary | ICD-10-CM | POA: Diagnosis not present

## 2019-12-04 DIAGNOSIS — E781 Pure hyperglyceridemia: Secondary | ICD-10-CM | POA: Diagnosis not present

## 2019-12-04 MED ORDER — VITAMIN B-12 1000 MCG PO TABS
500.0000 ug | ORAL_TABLET | Freq: Every day | ORAL | Status: DC
  Administered 2019-12-04 – 2019-12-06 (×3): 500 ug via ORAL
  Filled 2019-12-04 (×3): qty 1

## 2019-12-04 MED ORDER — FERROUS GLUCONATE 324 (38 FE) MG PO TABS
324.0000 mg | ORAL_TABLET | Freq: Two times a day (BID) | ORAL | Status: DC
  Administered 2019-12-04 – 2019-12-06 (×5): 324 mg via ORAL
  Filled 2019-12-04 (×6): qty 1

## 2019-12-04 NOTE — Progress Notes (Signed)
PROGRESS NOTE  Tracy Todd SWN:[id number] DOB: [birth year] DOA: 11/29/2019 PCP: Eartha Inch, MD   LOS: 4 days   Brief Narrative / Interim history: 54 year old male with Down syndrome, here with recurrent syncope.  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  This is likely to be chronic, not needing intervention  Subjective / 24h Interval events: In bed, no apparent distress or complaints.  Pleasant.  Assessment & Plan: Principal Problem Recurrent syncope -Patient underwent a CT angiogram of the neck with results concerning for right ICA occlusion distal to the bulb.  Vascular surgery as well as neurology were consulted and followed patient while hospitalized.  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  Neurology recommends aspirin and Plavix for 3 months followed by aspirin alone.  He underwent an EEG that was negative for seizures.  He is coming from a group home, unable to offer 24-hour supervision S he is requiring that now, SNF placement is pending  Active Problems Down syndrome-stable  Hyperlipidemia-continue statin  Iron and B12 deficiency-diagnosed in January of this year, currently I do not see him being on iron nor vitamin B12.  Start.   Scheduled Meds: . aspirin EC  81 mg Oral Daily  . celecoxib  100 mg Oral Daily  . clopidogrel  75 mg Oral Daily  . diclofenac Sodium  2 g Topical QID  . enoxaparin (LOVENOX) injection  40 mg Subcutaneous Q24H  . multivitamin with minerals  1 tablet Oral Daily  . pravastatin  40 mg Oral q1800  . sodium chloride flush  3 mL Intravenous Q12H  . sucralfate  1 g Oral BID WC   Continuous Infusions: . lactated ringers 75 mL/hr at 12/04/19 1142   PRN Meds:.acetaminophen **OR** acetaminophen, loperamide, LORazepam, ondansetron **OR** ondansetron (ZOFRAN) IV  DVT prophylaxis:  Lovenox Code Status: Full code Family Communication: no family at bedside  Patient admitted from: group home Anticipated d/c place: SNF Barriers to d/c: pending placement. Stable for DC. Repeat Covid pending  Consultants:  Neurology  Vascular surgery   Procedures:  None   Microbiology  None   Antimicrobials: none    Objective: Vitals:   12/03/19 1710 12/03/19 2253 12/04/19 0500 12/04/19 0753  BP: 108/71 125/71  117/79  Pulse: 92 79  72  Resp: 19 20  19   Temp: 97.8 F (36.6 C) 98.4 F (36.9 C)  98 F (36.7 C)  TempSrc:  Oral  Oral  SpO2: 95%   97%  Weight:   55.1 kg     Intake/Output Summary (Last 24 hours) at 12/04/2019 1147 Last data filed at 12/04/2019 0600 Gross per 24 hour  Intake 1348.95 ml  Output 1600 ml  Net -251.05 ml   Filed Weights   12/02/19 0500 12/03/19 0500 12/04/19 0500  Weight: 55.7 kg 54 kg 55.1 kg    Examination:  Constitutional: NAD Eyes: no scleral icterus ENMT: Mucous membranes are moist.  Neck: normal, supple Respiratory: clear to auscultation bilaterally, no wheezing, no crackles. Normal respiratory effort.  Cardiovascular: Regular rate and rhythm, no murmurs / rubs / gallops. No LE edema.  Abdomen: non distended, no tenderness. Bowel sounds positive.  Musculoskeletal: no clubbing / cyanosis.  Skin: no rashes Neurologic: non focal   Data Reviewed: I have independently reviewed following labs and imaging studies   CBC: Recent Labs  Lab 11/29/19 1048 11/30/19 0639  WBC 7.9  8.8  NEUTROABS 6.8  --   HGB 12.6* 13.7  HCT 40.8 43.5  MCV 101.0* 100.2*  PLT 255 382   Basic Metabolic Panel: Recent Labs  Lab 11/29/19 1048 11/30/19 0639  NA 140 141  K 4.6 3.9  CL 102 100  CO2 28 29  GLUCOSE 103* 105*  BUN 15 10  CREATININE 1.14 0.94  CALCIUM 8.9 8.8*   Liver Function Tests: No results for input(s): AST, ALT, ALKPHOS, BILITOT, PROT, ALBUMIN in the last 168 hours. Coagulation Profile: No results for input(s): INR,  PROTIME in the last 168 hours. HbA1C: Recent Labs    12/02/19 1006  HGBA1C 5.3   CBG: No results for input(s): GLUCAP in the last 168 hours.  Recent Results (from the past 240 hour(s))  SARS CORONAVIRUS 2 (TAT 6-24 HRS) Nasopharyngeal Nasopharyngeal Swab     Status: None   Collection Time: 11/29/19  3:13 PM   Specimen: Nasopharyngeal Swab  Result Value Ref Range Status   SARS Coronavirus 2 NEGATIVE NEGATIVE Final    Comment: (NOTE) SARS-CoV-2 target nucleic acids are NOT DETECTED. The SARS-CoV-2 RNA is generally detectable in upper and lower respiratory specimens during the acute phase of infection. Negative results do not preclude SARS-CoV-2 infection, do not rule out co-infections with other pathogens, and should not be used as the sole basis for treatment or other patient management decisions. Negative results must be combined with clinical observations, patient history, and epidemiological information. The expected result is Negative. Fact Sheet for Patients: SugarRoll.be Fact Sheet for Healthcare Providers: https://www.woods-mathews.com/ This test is not yet approved or cleared by the [place] FDA and  has been authorized for detection and/or diagnosis of SARS-CoV-2 by FDA under an Emergency Use Authorization (EUA). This EUA will remain  in effect (meaning this test can be used) for the duration of the COVID-19 declaration under Section 56 4(b)(1) of the Act, 21 U.S.C. section 360bbb-3(b)(1), unless the authorization is terminated or revoked sooner. Performed at [facility], [place] [address] [place], [place] [address]      Radiology Studies: No results found.  Marzetta Board, MD, PhD [facility]  Between 7 am - 7 pm I am available, please contact me via Amion or Securechat  Between 7 pm - 7 am I am not available, please contact night coverage MD/APP via Amion

## 2019-12-04 NOTE — Clinical Social Work Note (Signed)
Patient's PASRR is under manual review. Unable to dc to SNF until this number has been received. Will likely be a level II which will require additional screening. Likely wont be ready for discharge until early next week.

## 2019-12-04 NOTE — Plan of Care (Signed)

## 2019-12-04 NOTE — Progress Notes (Signed)
Physical Therapy Treatment Patient Details Name: Tracy Todd MRN: [id number] DOB: 17-Feb-1966 Today's Date: 12/04/2019    History of Present Illness 54 y.o. male with medical history significant of Down syndrome and seizures presenting with syncope (approx 3 separate episodes per report from group home). EEG completed and suggestive of moderate diffuse encephalopathy, nonspecific to etiology; No seizures or epileptiform discharges were seen throughout the recording.    PT Comments    Pt was more comfortable with therapists, more animated and participative.  Emphasis on transition to EOB, sitting balance, standing and transfers in the RW.    Follow Up Recommendations  Home health PT;Supervision/Assistance - 24 hour     Equipment Recommendations  Rolling walker with 5" wheels    Recommendations for Other Services       Precautions / Restrictions Precautions Precautions: Fall Precaution Comments: hx of syncopy. Down syndrome Restrictions Weight Bearing Restrictions: No    Mobility  Bed Mobility Overal bed mobility: Needs Assistance Bed Mobility: Supine to Sit     Supine to sit: +2 for physical assistance;Mod assist     General bed mobility comments: pt less resistant, but needed directional cues for initiation.  Transfers Overall transfer level: Needs assistance Equipment used: Rolling walker (2 wheeled) Transfers: Sit to/from [employer] Sit to Stand: Mod assist;+2 physical assistance(x2) Stand pivot transfers: Max assist;+2 physical assistance       General transfer comment: direction al cues, stability assist with neither knee fully extended for long.  L knee generally flexed with heel not contacting the floor often.  Ambulation/Gait             General Gait Details: pivoting only   Stairs             Wheelchair Mobility    Modified Rankin (Stroke Patients Only)       Balance Overall balance assessment: Needs  assistance Sitting-balance support: Feet supported Sitting balance-Leahy Scale: Poor(to fair.  pt leaning forward and balancing for seconds.) Sitting balance - Comments: Pt tolerated sitting EOB 25 min with variable mod to max assist. Left lateral lean throughout. Pt able to reach and center self near midline when attempting to don socks with figure four position.      Standing balance-Leahy Scale: Poor Standing balance comment: reliant on RW and or external support                            Cognition Arousal/Alertness: Awake/alert Behavior During Therapy: WFL for tasks assessed/performed Overall Cognitive Status: History of cognitive impairments - at baseline                                 General Comments: pt with history of downs syndrome, does not follow commands this session, resistive to therapist's attempts to mobilize       Exercises      General Comments        Pertinent Vitals/Pain Pain Assessment: Faces Faces Pain Scale: No hurt Pain Intervention(s): Monitored during session    Home Living                      Prior Function            PT Goals (current goals can now be found in the care plan section) Acute Rehab PT Goals Patient Stated Goal: none stated PT Goal Formulation: With patient/family Time  For Goal Achievement: 12/14/19 Potential to Achieve Goals: Fair Progress towards PT goals: Progressing toward goals    Frequency    Min 3X/week      PT Plan      Co-evaluation              AM-PAC PT "6 Clicks" Mobility   Outcome Measure  Help needed turning from your back to your side while in a flat bed without using bedrails?: Total Help needed moving from lying on your back to sitting on the side of a flat bed without using bedrails?: A Lot Help needed moving to and from a bed to a chair (including a wheelchair)?: Total Help needed standing up from a chair using your arms (e.g., wheelchair or bedside  chair)?: A Lot Help needed to walk in hospital room?: Total Help needed climbing 3-5 steps with a railing? : Total 6 Click Score: 8    End of Session   Activity Tolerance: Patient tolerated treatment well Patient left: in chair;with call bell/phone within reach;with family/visitor present Nurse Communication: Mobility status PT Visit Diagnosis: Other abnormalities of gait and mobility (R26.89);Muscle weakness (generalized) (M62.81);Difficulty in walking, not elsewhere classified (R26.2)     Time: 6734-1937 PT Time Calculation (min) (ACUTE ONLY): 20 min  Charges:  $Therapeutic Activity: 8-22 mins                     12/04/2019  Ginger Carne., PT Acute Rehabilitation Services [phone]  (pager) [phone]  (office)   Tessie Fass Nachmen Mansel 12/04/2019, 3:48 PM

## 2019-12-05 DIAGNOSIS — Q909 Down syndrome, unspecified: Secondary | ICD-10-CM | POA: Diagnosis not present

## 2019-12-05 LAB — SARS CORONAVIRUS 2 (TAT 6-24 HRS): SARS Coronavirus 2: NEGATIVE

## 2019-12-05 NOTE — Progress Notes (Signed)
PROGRESS NOTE  Tracy Todd HFW:[id number] DOB: [birth year] DOA: 11/29/2019 PCP: Eartha Inch, MD   LOS: 5 days   Brief Narrative / Interim history: 54 year old male with Down syndrome, here with recurrent syncope.  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  Vascular surgery as well as neurology were consulted and followed patient while hospitalized.  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  Neurology recommends aspirin and Plavix for 3 months followed by aspirin alone.  He underwent an EEG that was negative for seizures.  He is coming from a group home, unable to offer 24-hour supervision S he is requiring that now, SNF placement is pending, discussed with SW this morning, awaiting [place]  Active Problems Down syndrome-stable  Hyperlipidemia-continue statin  Iron and B12 deficiency-diagnosed in January of this year, continue iron and B12   Scheduled Meds: . aspirin EC  81 mg Oral Daily  . celecoxib  100 mg Oral Daily  . clopidogrel  75 mg Oral Daily  . diclofenac Sodium  2 g Topical QID  . enoxaparin (LOVENOX) injection  40 mg Subcutaneous Q24H  . ferrous gluconate  324 mg Oral BID WC  . multivitamin with minerals  1 tablet Oral Daily  . pravastatin  40 mg Oral q1800  . sodium chloride flush  3 mL Intravenous Q12H  . sucralfate  1 g Oral BID WC  . vitamin B-12  500 mcg Oral Daily   Continuous Infusions: . lactated ringers Stopped (12/05/19 0542)   PRN Meds:.acetaminophen **OR** acetaminophen, loperamide, LORazepam, ondansetron **OR**  ondansetron (ZOFRAN) IV  DVT prophylaxis: Lovenox Code Status: Full code Family Communication: no family at bedside  Patient admitted from: group home Anticipated d/c place: SNF Barriers to d/c: pending placement. Stable for DC. Repeat Covid pending  Consultants:  Neurology  Vascular surgery   Procedures:  None   Microbiology  None   Antimicrobials: none    Objective: Vitals:   12/04/19 0753 12/04/19 2225 12/05/19 0500 12/05/19 0900  BP: 117/79 (!) 144/89  110/70  Pulse: 72 88  81  Resp: 19 18  18   Temp: 98 F (36.7 C) 98.3 F (36.8 C)  (!) 97.3 F (36.3 C)  TempSrc: Oral Oral  Oral  SpO2: 97% 98%  95%  Weight:   54.4 kg     Intake/Output Summary (Last 24 hours) at 12/05/2019 1154 Last data filed at 12/05/2019 0543 Gross per 24 hour  Intake 1073.58 ml  Output 950 ml  Net 123.58 ml   Filed Weights   12/03/19 0500 12/04/19 0500 12/05/19 0500  Weight: 54 kg 55.1 kg 54.4 kg    Examination:  Constitutional: No distress Respiratory: Clear bilaterally Cardiovascular: Regular rate and rhythm, no murmurs, no edema  Data Reviewed: I have independently reviewed following labs and imaging studies   CBC: Recent Labs  Lab 11/29/19 1048 11/30/19 0639  WBC 7.9 8.8  NEUTROABS 6.8  --   HGB 12.6* 13.7  HCT 40.8 43.5  MCV 101.0* 100.2*  PLT 255 269   Basic Metabolic Panel: Recent Labs  Lab 11/29/19 1048 11/30/19 0639  NA 140 141  K 4.6 3.9  CL 102 100  CO2 28 29  GLUCOSE 103* 105*  BUN 15 10  CREATININE 1.14 0.94  CALCIUM 8.9 8.8*   Liver Function Tests: No results for input(s): AST, ALT, ALKPHOS, BILITOT, PROT, ALBUMIN in the last 168 hours. Coagulation Profile: No results for input(s): INR, PROTIME in the last 168 hours. HbA1C: No results for input(s): HGBA1C in the last 72 hours. CBG: No results for input(s): GLUCAP in the last 168 hours.  Recent Results (from the past 240 hour(s))  SARS CORONAVIRUS 2 (TAT 6-24 HRS) Nasopharyngeal  Nasopharyngeal Swab     Status: None   Collection Time: 11/29/19  3:13 PM   Specimen: Nasopharyngeal Swab  Result Value Ref Range Status   SARS Coronavirus 2 NEGATIVE NEGATIVE Final    Comment: (NOTE) SARS-CoV-2 target nucleic acids are NOT DETECTED. The SARS-CoV-2 RNA is generally detectable in upper and lower respiratory specimens during the acute phase of infection. Negative results do not preclude SARS-CoV-2 infection, do not rule out co-infections with other pathogens, and should not be used as the sole basis for treatment or other patient management decisions. Negative results must be combined with clinical observations, patient history, and epidemiological information. The expected result is Negative. Fact Sheet for Patients: SugarRoll.be Fact Sheet for Healthcare Providers: https://www.woods-mathews.com/ This test is not yet approved or cleared by the [place] FDA and  has been authorized for detection and/or diagnosis of SARS-CoV-2 by FDA under an Emergency Use Authorization (EUA). This EUA will remain  in effect (meaning this test can be used) for the duration of the COVID-19 declaration under Section 56 4(b)(1) of the Act, 21 U.S.C. section 360bbb-3(b)(1), unless the authorization is terminated or revoked sooner. Performed at [facility], [place] [address] [place], [place] [address]   SARS CORONAVIRUS 2 (TAT 6-24 HRS) Nasopharyngeal Nasopharyngeal Swab     Status: None   Collection Time: 12/04/19 10:30 AM   Specimen: Nasopharyngeal Swab  Result Value Ref Range Status   SARS Coronavirus 2 NEGATIVE NEGATIVE Final    Comment: (NOTE) SARS-CoV-2 target nucleic acids are NOT DETECTED. The SARS-CoV-2 RNA is generally detectable in upper and lower respiratory specimens during the acute phase of infection. Negative results do not preclude SARS-CoV-2 infection, do not rule out co-infections with other pathogens, and should not  be used as the sole basis for treatment or other patient management decisions. Negative results must be combined with clinical observations, patient history, and epidemiological information. The expected result is Negative. Fact Sheet for Patients: SugarRoll.be Fact Sheet for Healthcare Providers: https://www.woods-mathews.com/ This test is not yet approved or cleared by the [place] FDA and  has been authorized for detection and/or diagnosis of SARS-CoV-2 by FDA under an Emergency Use Authorization (EUA). This EUA will remain  in effect (meaning this test can be used) for the duration of the COVID-19 declaration under Section 56 4(b)(1) of the Act, 21 U.S.C. section 360bbb-3(b)(1), unless the authorization is terminated or revoked sooner. Performed at [facility], [place] [address], [place], [place] [address]      Radiology Studies: No results found.  Marzetta Board, MD, PhD [facility]  Between 7 am - 7 pm I am available, please contact me via Amion or Securechat  Between 7 pm - 7 am I am not available, please contact night coverage MD/APP via Amion

## 2019-12-06 DIAGNOSIS — Q909 Down syndrome, unspecified: Secondary | ICD-10-CM | POA: Diagnosis not present

## 2019-12-06 DIAGNOSIS — E781 Pure hyperglyceridemia: Secondary | ICD-10-CM | POA: Diagnosis not present

## 2019-12-06 LAB — BASIC METABOLIC PANEL
Anion gap: 10 (ref 5–15)
BUN: 11 mg/dL (ref 6–20)
CO2: 27 mmol/L (ref 22–32)
Calcium: 8 mg/dL — ABNORMAL LOW (ref 8.9–10.3)
Chloride: 100 mmol/L (ref 98–111)
Creatinine, Ser: 0.93 mg/dL (ref 0.61–1.24)
GFR calc Af Amer: >60 mL/min (ref >60)
GFR calc non Af Amer: >60 mL/min (ref >60)
Glucose, Bld: 123 mg/dL — ABNORMAL HIGH (ref 70–99)
Potassium: 4.1 mmol/L (ref 3.5–5.1)
Sodium: 137 mmol/L (ref 135–145)

## 2019-12-06 LAB — CBC
HCT: 36.5 % — ABNORMAL LOW (ref 39.0–52.0)
Hemoglobin: 11.7 g/dL — ABNORMAL LOW (ref 13.0–17.0)
MCH: 31.4 pg (ref 26.0–34.0)
MCHC: 32.1 g/dL (ref 30.0–36.0)
MCV: 97.9 fL (ref 80.0–100.0)
Platelets: 307 10*3/uL (ref 150–400)
RBC: 3.73 MIL/uL — ABNORMAL LOW (ref 4.22–5.81)
RDW: 14.6 % (ref 11.5–15.5)
WBC: 6.8 10*3/uL (ref 4.0–10.5)
nRBC: 0 % (ref 0.0–0.2)

## 2019-12-06 MED ORDER — CLOPIDOGREL BISULFATE 75 MG PO TABS: 75.0000 mg | ORAL_TABLET | Freq: Every day | ORAL | 0 refills | Status: DC

## 2019-12-06 MED ORDER — DIAZEPAM 5 MG PO TABS: 5.0000 mg | ORAL_TABLET | ORAL | 0 refills | Status: DC

## 2019-12-06 MED ORDER — ASPIRIN 81 MG PO TBEC: 81.0000 mg | DELAYED_RELEASE_TABLET | Freq: Every day | ORAL | 1 refills | Status: DC

## 2019-12-06 NOTE — TOC Transition Note (Addendum)
Transition of Care [facility]) - CM/SW Discharge Note   Patient Details  Name: Tracy Todd MRN: [id number] Date of Birth: March 07, 1966  [facility] CM/SW Contact:  Truddie Hidden, LCSW Phone Number: 12/06/2019, 2:21 PM   Clinical Narrative:    Discharged to [facility]. Referral coordinated with Olegario Messier. Patient's father Tracy Todd aware and agreeable to this plan. Number to call report [phone] room 126A given to unit RN Danielle. DC summary sent via epic. No other needs at this time. Case closed to this CSW.    Final next level of care: Skilled Nursing Facility Barriers to Discharge: Continued Medical Work up, Awaiting State Approval (PASRR)   Patient Goals and CMS Choice   CMS Medicare.gov Compare Post Acute Care list provided to:: Patient Represenative (must comment) Choice offered to / list presented to : Parent  Discharge Placement PASRR number recieved: 12/06/19            Patient [facility] bed at: [facility] Patient to be transferred to facility by: [facility] Name of family member notified: Bill Patient and family notified of of transfer: 12/06/19  Discharge Plan and Services                                     Social Determinants of Health (SDOH) Interventions     Readmission Risk Interventions No flowsheet data found.

## 2019-12-06 NOTE — Progress Notes (Signed)
Physical Therapy Treatment Patient Details Name: Tracy Todd MRN: [id number] DOB: [birth year] Today's Date: 12/06/2019    History of Present Illness 54 y.o. male with medical history significant of Down syndrome and seizures presenting with syncope (approx 3 separate episodes per report from group home). EEG completed and suggestive of moderate diffuse encephalopathy, nonspecific to etiology; No seizures or epileptiform discharges were seen throughout the recording.  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  Vascular surgery as well as neurology were consulted and followed patient while hospitalized - recommended management with medication.    PT Comments    Pt remains limited due to decreased ability/willingness to follow commands and strong lateral lean at sitting EOB.  Pt was unable to correct despite multimodal cues and attempts to reach or perform task.  Pt requiring total assist of 2 for bed mobility.  Would need lift for OOB transfers today.    Follow Up Recommendations  SNF     Equipment Recommendations  Wheelchair cushion (measurements PT);Wheelchair (measurements PT)    Recommendations for Other Services       Precautions / Restrictions Precautions Precautions: Fall Precaution Comments: hx of syncopy. Down syndrome    Mobility  Bed Mobility Overal bed mobility: Needs Assistance Bed Mobility: Supine to Sit Rolling: Total assist;+2 for physical assistance   Supine to sit: +2 for physical assistance;Max assist Sit to supine: Max assist   General bed mobility comments: Pt following minimal commands and resisting movements at time.  Transfers                 General transfer comment: unsafe would need hoyer  Ambulation/Gait                 Stairs             Wheelchair Mobility    Modified Rankin (Stroke Patients Only)       Balance Overall balance assessment: Needs  assistance Sitting-balance support: Feet supported Sitting balance-Leahy Scale: Zero Sitting balance - Comments: Pt with strong L lateral lean requiring max A to maintain balance.  Attempted to correct with reaching, verbal, tactile cues but unable .  EOB for 5-8 minutes.       Standing balance comment: unsafe                            Cognition Arousal/Alertness: Awake/alert Behavior During Therapy: Anxious Overall Cognitive Status: History of cognitive impairments - at baseline                                 General Comments: pt with history of downs syndrome, does not follow commands this session, resistive to therapist's attempts to mobilize   Pt enjoys sports - attempted to gain rapport by discussing sports.    Exercises General Exercises - Lower Extremity Heel Slides: AAROM;Both;5 reps;Supine    General Comments General comments (skin integrity, edema, etc.): Pt's family stepped out, felt pt would do more without them in room.      Pertinent Vitals/Pain Pain Assessment: No/denies pain    Home Living                      Prior Function            PT Goals (current goals can now be found in  the care plan section) Progress towards PT goals: Not progressing toward goals - comment(limited ability/willingness to follow commands)    Frequency    Min 3X/week      PT Plan Discharge plan needs to be updated    Co-evaluation              AM-PAC PT "6 Clicks" Mobility   Outcome Measure  Help needed turning from your back to your side while in a flat bed without using bedrails?: Total Help needed moving from lying on your back to sitting on the side of a flat bed without using bedrails?: Total Help needed moving to and from a bed to a chair (including a wheelchair)?: Total Help needed standing up from a chair using your arms (e.g., wheelchair or bedside chair)?: Total Help needed to walk in hospital room?: Total Help needed  climbing 3-5 steps with a railing? : Total 6 Click Score: 6    End of Session Equipment Utilized During Treatment: Gait belt Activity Tolerance: Other (comment)(limited by not following commands) Patient left: in bed;with call bell/phone within reach;with bed alarm set Nurse Communication: Mobility status;Need for lift equipment PT Visit Diagnosis: Other abnormalities of gait and mobility (R26.89);Muscle weakness (generalized) (M62.81);Difficulty in walking, not elsewhere classified (R26.2)     Time: 9604-5409 PT Time Calculation (min) (ACUTE ONLY): 20 min  Charges:  $Therapeutic Activity: 8-22 mins                     Maggie Font, PT Acute Rehab Services Pager [phone] [facility] [phone] [facility] [phone]    Tracy Todd 12/06/2019, 2:43 PM

## 2019-12-06 NOTE — Progress Notes (Deleted)
PROGRESS NOTE  Tracy Todd FIE:[id number] DOB: 1965-09-16 DOA: 11/29/2019 PCP: Chesley Noon, MD   LOS: 6 days   Brief Narrative / Interim history: 54 year old male with Down syndrome, here with recurrent syncope.  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  Vascular surgery as well as neurology were consulted and followed patient while hospitalized.  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  Neurology recommends aspirin and Plavix for 3 months followed by aspirin alone.  He underwent an EEG that was negative for seizures.  He is coming from a group home, unable to offer 24-hour supervision S he is requiring that now, SNF placement is pending, awaiting [facility]  Active Problems Down syndrome-stable  Hyperlipidemia-continue statin  Iron and B12 deficiency-diagnosed in January of this year, continue iron and B12   Scheduled Meds: . aspirin EC  81 mg Oral Daily  . celecoxib  100 mg Oral Daily  . clopidogrel  75 mg Oral Daily  . diclofenac Sodium  2 g Topical QID  . enoxaparin (LOVENOX) injection  40 mg Subcutaneous Q24H  . ferrous gluconate  324 mg Oral BID WC  . multivitamin with minerals  1 tablet Oral Daily  . pravastatin  40 mg Oral q1800  . sodium chloride flush  3 mL Intravenous Q12H  . sucralfate  1 g Oral BID WC  . vitamin B-12  500 mcg Oral Daily   Continuous Infusions: . lactated ringers 75 mL/hr at 12/06/19 0243   PRN Meds:.acetaminophen **OR** acetaminophen, loperamide, LORazepam, ondansetron **OR** ondansetron (ZOFRAN) IV  DVT  prophylaxis: Lovenox Code Status: Full code Family Communication: Discussed with patient sister and father on 4/15 Patient admitted from: group home Anticipated d/c place: SNF Barriers to d/c: pending placement. Stable for DC. Repeat Covid pending  Consultants:  Neurology  Vascular surgery   Procedures:  None   Microbiology  None   Antimicrobials: none    Objective: Vitals:   12/05/19 1748 12/05/19 2342 12/06/19 0500 12/06/19 0748  BP: 108/64 96/68  (!) 97/56  Pulse: 68 76  66  Resp: 16   16  Temp: 98.5 F (36.9 C) 97.6 F (36.4 C)  97.9 F (36.6 C)  TempSrc: Oral Axillary    SpO2: 97% 96%  98%  Weight:   56 kg     Intake/Output Summary (Last 24 hours) at 12/06/2019 1230 Last data filed at 12/06/2019 0509 Gross per 24 hour  Intake 705.95 ml  Output 400 ml  Net 305.95 ml   Filed Weights   12/04/19 0500 12/05/19 0500 12/06/19 0500  Weight: 55.1 kg 54.4 kg 56 kg    Examination:  Constitutional: NAD Respiratory: Clear without wheezing Cardiovascular: Regular, no edema  Data Reviewed: I have independently reviewed following labs and imaging studies   CBC: Recent Labs  Lab 11/30/19 0639 12/06/19 0219  WBC 8.8 6.8  HGB 13.7 11.7*  HCT 43.5 36.5*  MCV 100.2* 97.9  PLT 269 166   Basic Metabolic Panel: Recent Labs  Lab 11/30/19 0639 12/06/19 0219  NA 141 137  K 3.9 4.1  CL 100 100  CO2 29 27  GLUCOSE 105* 123*  BUN 10 11  CREATININE 0.94 0.93  CALCIUM 8.8* 8.0*   Liver Function Tests: No results for input(s): AST, ALT, ALKPHOS, BILITOT, PROT, ALBUMIN in the last 168 hours. Coagulation Profile: No results for input(s): INR, PROTIME in the last 168 hours. HbA1C: No results for input(s): HGBA1C in the last 72 hours. CBG: No results for input(s): GLUCAP in the last 168 hours.  Recent Results (from the past 240 hour(s))  SARS CORONAVIRUS 2 (TAT 6-24 HRS) Nasopharyngeal Nasopharyngeal Swab     Status: None   Collection Time: 11/29/19  3:13 PM     Specimen: Nasopharyngeal Swab  Result Value Ref Range Status   SARS Coronavirus 2 NEGATIVE NEGATIVE Final    Comment: (NOTE) SARS-CoV-2 target nucleic acids are NOT DETECTED. The SARS-CoV-2 RNA is generally detectable in upper and lower respiratory specimens during the acute phase of infection. Negative results do not preclude SARS-CoV-2 infection, do not rule out co-infections with other pathogens, and should not be used as the sole basis for treatment or other patient management decisions. Negative results must be combined with clinical observations, patient history, and epidemiological information. The expected result is Negative. Fact Sheet for Patients: HairSlick.no Fact Sheet for Healthcare Providers: quierodirigir.com This test is not yet approved or cleared by the [place] FDA and  has been authorized for detection and/or diagnosis of SARS-CoV-2 by FDA under an Emergency Use Authorization (EUA). This EUA will remain  in effect (meaning this test can be used) for the duration of the COVID-19 declaration under Section 56 4(b)(1) of the Act, 21 U.S.C. section 360bbb-3(b)(1), unless the authorization is terminated or revoked sooner. Performed at [facility], [address] [place], [place] [address]   SARS CORONAVIRUS 2 (TAT 6-24 HRS) Nasopharyngeal Nasopharyngeal Swab     Status: None   Collection Time: 12/04/19 10:30 AM   Specimen: Nasopharyngeal Swab  Result Value Ref Range Status   SARS Coronavirus 2 NEGATIVE NEGATIVE Final    Comment: (NOTE) SARS-CoV-2 target nucleic acids are NOT DETECTED. The SARS-CoV-2 RNA is generally detectable in upper and lower respiratory specimens during the acute phase of infection. Negative results do not preclude SARS-CoV-2 infection, do not rule out co-infections with other pathogens, and should not be used as the sole basis for treatment or other patient management  decisions. Negative results must be combined with clinical observations, patient history, and epidemiological information. The expected result is Negative. Fact Sheet for Patients: HairSlick.no Fact Sheet for Healthcare Providers: quierodirigir.com This test is not yet approved or cleared by the [place] FDA and  has been authorized for detection and/or diagnosis of SARS-CoV-2 by FDA under an Emergency Use Authorization (EUA). This EUA will remain  in effect (meaning this test can be used) for the duration of the COVID-19 declaration under Section 56 4(b)(1) of the Act, 21 U.S.C. section 360bbb-3(b)(1), unless the authorization is terminated or revoked sooner. Performed at [facility], [address], [place], [place] [address]      Radiology Studies: No results found.  Pamella Pert, MD, PhD [facility]  Between 7 am - 7 pm I am available, please contact me via Amion or Securechat  Between 7 pm - 7 am I am not available, please contact night coverage MD/APP via Amion

## 2019-12-06 NOTE — Discharge Summary (Signed)
Physician Discharge Summary  Tracy Todd WGN:[id number] DOB: 07-12-1966 DOA: 11/29/2019  PCP: Eartha Inch, MD  Admit date: 11/29/2019 Discharge date: 12/06/2019  Admitted From: Group home Disposition: SNF  Recommendations for Outpatient Follow-up:  1. Follow up with PCP in 1-2 weeks 2. Continue dual antiplatelet therapy with aspirin and Plavix for 3 months then aspirin alone  Home Health: None Equipment/Devices: None  Discharge Condition: Stable CODE STATUS: Full code Diet recommendation: Heart healthy diet  HPI: Per admitting MD, Tracy Todd is a 54 y.o. male with medical history significant of Down syndrome and ?seizures presenting with syncope.  He stays at a group home and his father wasn't there.  They told his dad that he passed out, possibly in the bathroom.  This is maybe the third time.  He has had tests in the past without a clear answer.  They thought maybe it was due to anemia or a GI problem.  GI talked about a c-scope; they talked a Cologuard first and that hasn't happened.  He has not had seizures and they have no considered this.  His father thinks this all happened after his second COVID vaccine.  The staff at his group home has not noticed him to be his usual happy self in the last few months.  All 3 episodes have happened in the bathroom - orthostasis is a concern.    Hospital Course / Discharge diagnoses: Principal Problem Recurrent syncope -Patient underwent a CT angiogram of the neck with results concerning for right ICA occlusion distal to the bulb.  Vascular surgery as well as neurology were consulted and followed patient while hospitalized.  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  Neurology recommends aspirin and Plavix for 3 months followed by aspirin alone.  He underwent an EEG that was negative for seizures.  Neurology and vascular surgery cleared  patient for discharge, he is stable, back to baseline.  Active Problems Down syndrome-stable Hyperlipidemia-continue statin Iron and B12 deficiency- continue iron and B12  Discharge Instructions   Allergies as of 12/06/2019      Reactions   Penicillins    unknown      Medication List    TAKE these medications   aspirin 81 MG EC tablet Take 1 tablet (81 mg total) by mouth daily. Start taking on: December 07, 2019   celecoxib 100 MG capsule Commonly known as: CELEBREX Take 100 mg by mouth daily.   CeraVe Hydrating Cleanser Liqd Apply 1 application topically daily. Use with cool showers   ciclopirox 0.77 % Susp Commonly known as: LOPROX Apply 1 application topically 2 (two) times daily as needed (Dry face and scalp).   clopidogrel 75 MG tablet Commonly known as: PLAVIX Take 1 tablet (75 mg total) by mouth daily. Start taking on: December 07, 2019   diazepam 5 MG tablet Commonly known as: VALIUM Take 1 tablet (5 mg total) by mouth See admin instructions. Take 1 tablet 30 minutes prior to procedure, not to exceed 2 tablets.   diclofenac Sodium 1 % Gel Commonly known as: VOLTAREN Apply 1 g topically 4 (four) times daily. To both knees   FIBER ADULT GUMMIES PO Take 2 tablets by mouth daily.   iron polysaccharides 150 MG capsule Commonly known as: Nu-Iron Take 1 capsule (150 mg total) by mouth daily.   ketoconazole 2 % shampoo Commonly known as: NIZORAL Apply 1 application topically See admin instructions.  Use to wash hair and face, alternate by week.   loperamide 2 MG tablet Commonly known as: IMODIUM A-D Take 2 mg by mouth 2 (two) times daily.   Micatin 2 % cream Generic drug: miconazole Apply 1 application topically 2 (two) times daily. Apply to feet   multivitamin with minerals tablet Take 1 tablet by mouth daily.   nystatin cream Commonly known as: MYCOSTATIN Apply 1 application topically 3 (three) times daily as needed for dry skin.   pravastatin 40 MG  tablet Commonly known as: PRAVACHOL Take 40 mg by mouth daily.   selenium sulfide 1 % Lotn Commonly known as: SELSUN Apply 1 application topically See admin instructions. Use to wash hair and face alternate by week.   sucralfate 1 g tablet Commonly known as: CARAFATE Take 1 g by mouth 2 (two) times daily.   vitamin B-12 500 MCG tablet Commonly known as: CYANOCOBALAMIN Take 1 tablet (500 mcg total) by mouth daily.      Contact information for after-discharge care    Destination    [facility] Preferred SNF .   Service: Skilled Nursing Contact information: [address] [place] [address] [phone]              Consultations:  Neurology   Vascular surgery   Procedures/Studies:  CT ANGIO HEAD W OR WO CONTRAST  Result Date: 12/02/2019 CLINICAL DATA:  Transient ischemic attack (TIA). EXAM: CT ANGIOGRAPHY HEAD TECHNIQUE: Multidetector CT imaging of the head was performed using the standard protocol during bolus administration of intravenous contrast. Multiplanar CT image reconstructions and MIPs were obtained to evaluate the vascular anatomy. CONTRAST:  75mL OMNIPAQUE IOHEXOL 350 MG/ML SOLN COMPARISON:  CT angiogram neck 11/30/2019, head CT 11/29/2019. FINDINGS: CT HEAD Brain: There is no evidence of acute intracranial hemorrhage, intracranial mass, midline shift or extra-axial fluid collection.No demarcated cortical infarction. Stable, moderate generalized parenchymal atrophy with associated prominence of ventricles and sulci. Vascular: Reported below. Skull: Normal. Negative for fracture or focal lesion. Sinuses: No significant paranasal sinus disease or mastoid effusion at the imaged levels. Orbits: Visualized orbits demonstrate no acute abnormality. CTA HEAD Anterior circulation: There is a markedly diminutive appearance of the visualized cervical ICA and right ICA siphon. There is prominent calcified plaque within the distal cavernous right  ICA with severe stenosis. Distal to this, there is calcified plaque with focal occlusion or near occlusion of the para clinoid/supraclinoid right ICA. There is reconstitution of the supraclinoid right ICA more distally. The intracranial left internal carotid artery is patent. Calcified plaque within the paraclinoid left ICA with mild stenosis. The M1 middle cerebral arteries are patent without significant stenosis. No M2 proximal branch occlusion or high-grade proximal stenosis is identified. The anterior cerebral arteries are patent without significant proximal stenosis. No intracranial aneurysm is identified. Posterior circulation: The intracranial right vertebral artery is developmentally diminutive, but patent. The dominant intracranial left vertebral artery is patent without significant stenosis. The basilar artery is patent without significant stenosis. The posterior cerebral arteries are patent without significant proximal stenosis. Small posterior communicating arteries are present bilaterally. Venous sinuses: Poorly assessed due to contrast timing. Anatomic variants: As described IMPRESSION: CT head: 1. Unremarkable non-contrast CT appearance of the brain. No evidence of acute intracranial abnormality. 2. Stable, moderate generalized parenchymal atrophy. CTA head: 1. Markedly diminutive appearance of the visualized cervical right ICA and right ICA siphon. Calcified plaque within the distal cavernous right ICA with resultant severe stenosis. Calcified plaque within the paraclinoid/supraclinoid right ICA with  focal occlusive or near-occlusive stenosis at this site. There is reconstitution of the supraclinoid right ICA more distally. 2. No intracranial large vessel occlusion or high-grade proximal arterial stenosis elsewhere. Electronically Signed   By: Jackey Loge DO   On: 12/02/2019 14:10   CT Head Wo Contrast  Result Date: 11/29/2019 CLINICAL DATA:  Seizure, head injury after fall. EXAM: CT HEAD WITHOUT  CONTRAST CT CERVICAL SPINE WITHOUT CONTRAST TECHNIQUE: Multidetector CT imaging of the head and cervical spine was performed following the standard protocol without intravenous contrast. Multiplanar CT image reconstructions of the cervical spine were also generated. COMPARISON:  October 12, 2019. FINDINGS: CT HEAD FINDINGS Brain: Mild diffuse cortical atrophy is noted. No mass effect or midline shift is noted. Ventricular size is within normal limits. There is no evidence of mass lesion, hemorrhage or acute infarction. Vascular: No hyperdense vessel or unexpected calcification. Skull: Normal. Negative for fracture or focal lesion. Sinuses/Orbits: No acute finding. Other: None. CT CERVICAL SPINE FINDINGS Alignment: Mild grade 1 retrolisthesis of C3-4 is noted secondary to moderate degenerative disc disease at this level. Skull base and vertebrae: No acute fracture. No primary bone lesion or focal pathologic process. Soft tissues and spinal canal: No prevertebral fluid or swelling. No visible canal hematoma. Disc levels: Moderate degenerative disc disease is also noted at C4-5, C5-6 and C6-7 with anterior posterior osteophyte formation. Upper chest: Negative. Other: None. IMPRESSION: Mild diffuse cortical atrophy. No acute intracranial abnormality seen. Moderate multilevel degenerative disc disease. No acute abnormality seen in the cervical spine Electronically Signed   By: Lupita Raider M.D.   On: 11/29/2019 10:23   CT ANGIO NECK W OR WO CONTRAST  Addendum Date: 11/30/2019   ADDENDUM REPORT: 11/30/2019 18:17 ADDENDUM: Study discussed by telephone with Dr. Rickey Barbara on 11/30/2019 at 1808 hours. Electronically Signed   By: Odessa Fleming M.D.   On: 11/30/2019 18:17   Result Date: 11/30/2019 CLINICAL DATA:  54 year old male with recent seizure, fall, head injury. EXAM: CT ANGIOGRAPHY NECK TECHNIQUE: Multidetector CT imaging of the neck was performed using the standard protocol during bolus administration of  intravenous contrast. Multiplanar CT image reconstructions and MIPs were obtained to evaluate the vascular anatomy. Carotid stenosis measurements (when applicable) are obtained utilizing NASCET criteria, using the distal internal carotid diameter as the denominator. CONTRAST:  21mL OMNIPAQUE IOHEXOL 350 MG/ML SOLN COMPARISON:  CT head and cervical spine yesterday. CTA chest 09/16/2019. FINDINGS: Skeleton: Very severe chronic cervical spine degeneration, including degenerative appearing widening of the atlanto dens interval. There is also severe joint degeneration at the left occiput-C1 articulation. Diffuse cervical spinal stenosis, which could be moderate or severe at several levels (C3-C4 series 4, image 87). No acute osseous abnormality identified. Upper chest: Negative upper chest aside from what is probably dependent atelectasis in the left lung. Other neck: No acute neck soft tissue findings. Negative visible brain parenchyma. Aortic arch: 3 vessel arch configuration with no arch atherosclerosis. Right carotid system: Negative right CCA and right carotid bifurcation. There is irregularity due to soft plaque at the level of the distal right ICA bulb, where the vessel rapidly tapers (series 8, image 97) and remains diminutive than to the skull base. The visible right ICA siphon demonstrates only thread-like enhancement (series 4, image 8), and the anterior genu may be occluded at the level of calcified plaque, uncertain. Left carotid system: Negative left CCA, left carotid bifurcation and cervical left ICA. Visible left ICA siphon is patent with mild supraclinoid calcified plaque. Vertebral  arteries: Proximal right subclavian artery and right vertebral artery origin are patent without stenosis. The right vertebral appears non dominant but is patent to the vertebrobasilar junction. The right V4 segment is diminutive beyond the right PICA origin. Negative visible basilar artery. Proximal left subclavian artery and  left vertebral artery origin are normal. The left vertebral is dominant and patent to the basilar without stenosis. Normal left PICA origin. Review of the MIP images confirms the above findings IMPRESSION: 1. Abnormal, rapidly tapering Right ICA distal to the bulb, and only thread-like enhancement of the visible Right ICA siphon which is likely to be occluded or functionally occluded distally. 2. Negative left carotid and bilateral vertebral arteries (the left vertebral is dominant). 3. Very severe degeneration of the craniocervical junction and cervical spine, with suspected moderate or severe cervical spinal stenosis at multiple levels. Electronically Signed: By: Genevie Ann M.D. On: 11/30/2019 17:55   CT Cervical Spine Wo Contrast  Result Date: 11/29/2019 CLINICAL DATA:  Seizure, head injury after fall. EXAM: CT HEAD WITHOUT CONTRAST CT CERVICAL SPINE WITHOUT CONTRAST TECHNIQUE: Multidetector CT imaging of the head and cervical spine was performed following the standard protocol without intravenous contrast. Multiplanar CT image reconstructions of the cervical spine were also generated. COMPARISON:  October 12, 2019. FINDINGS: CT HEAD FINDINGS Brain: Mild diffuse cortical atrophy is noted. No mass effect or midline shift is noted. Ventricular size is within normal limits. There is no evidence of mass lesion, hemorrhage or acute infarction. Vascular: No hyperdense vessel or unexpected calcification. Skull: Normal. Negative for fracture or focal lesion. Sinuses/Orbits: No acute finding. Other: None. CT CERVICAL SPINE FINDINGS Alignment: Mild grade 1 retrolisthesis of C3-4 is noted secondary to moderate degenerative disc disease at this level. Skull base and vertebrae: No acute fracture. No primary bone lesion or focal pathologic process. Soft tissues and spinal canal: No prevertebral fluid or swelling. No visible canal hematoma. Disc levels: Moderate degenerative disc disease is also noted at C4-5, C5-6 and C6-7  with anterior posterior osteophyte formation. Upper chest: Negative. Other: None. IMPRESSION: Mild diffuse cortical atrophy. No acute intracranial abnormality seen. Moderate multilevel degenerative disc disease. No acute abnormality seen in the cervical spine Electronically Signed   By: Marijo Conception M.D.   On: 11/29/2019 10:23   EEG adult  Result Date: 11/29/2019 Lora Havens, MD     11/29/2019  4:49 PM Patient Name: Richad Ramsay MRN: [id number] Epilepsy Attending: Lora Havens Referring Physician/Provider: Dr. Karmen Bongo Date: 11/29/2019 Duration: 25.25 mins Patient history: 54 year old male with multiple syncopal episodes.  EEG evaluate for seizures. Level of alertness: Awake AEDs during EEG study: None Technical aspects: This EEG study was done with scalp electrodes positioned according to the 10-20 International system of electrode placement. Electrical activity was acquired at a sampling rate of 500Hz  and reviewed with a high frequency filter of 70Hz  and a low frequency filter of 1Hz . EEG data were recorded continuously and digitally stored. Description: During awake state, no clear posterior dominant rhythm was seen.  EEG showed continuous generalized, maximal bifrontal 3 to 6 Hz theta-delta slowing.  Hyperventilation and photic stimulation were not performed. Abnormality -Continuous slow, generalized IMPRESSION: This study is suggestive of moderate diffuse encephalopathy, nonspecific to etiology. No seizures or epileptiform discharges were seen throughout the recording. Priyanka O Yadav   VAS US CAROTID  Result Date: 12/01/2019 Carotid Arterial Duplex Study Indications:       Syncope and Down's Syndrome. Risk Factors:      Hyperlipidemia.  Limitations        Today's exam was limited due to the patient's inability or                    unwillingness to cooperate and the body habitus of the                    patient. Comparison Study:  No prior study on file Performing Technologist: Sherren Kernsandace  [facility]  Examination Guidelines: A complete evaluation includes B-mode imaging, spectral Doppler, color Doppler, and power Doppler as needed of all accessible portions of each vessel. Bilateral testing is considered an integral part of a complete examination. Limited examinations for reoccurring indications may be performed as noted.  Right Carotid Findings: +----------+--------+--------+--------+------------------+---------------+           PSV cm/sEDV cm/sStenosisPlaque DescriptionComments        +----------+--------+--------+--------+------------------+---------------+ CCA Prox  87      12              homogeneous                       +----------+--------+--------+--------+------------------+---------------+ CCA Distal83      7               homogeneous                       +----------+--------+--------+--------+------------------+---------------+ ICA Prox  11      3       Occluded                  Trickle flow    +----------+--------+--------+--------+------------------+---------------+ ICA Mid   27                                                        +----------+--------+--------+--------+------------------+---------------+ ICA Distal55      24                                                +----------+--------+--------+--------+------------------+---------------+ ECA       61      4                                 spiked waveform +----------+--------+--------+--------+------------------+---------------+ +---------+--------+--+--------+-+---------------+ VertebralPSV cm/s52EDV cm/s7Spiked waveform +---------+--------+--+--------+-+---------------+  Left Carotid Findings: +---------+--------+--+--------+--+--------+ VertebralPSV cm/s92EDV cm/s38Atypical +---------+--------+--+--------+--+--------+   Summary: Right Carotid: Evidence consistent with a total occlusion of the right ICA. Left Carotid: Unable to accurately evaluate the left Carotid  arteries secondary               to patient's inability to cooperate and tortuosity of vessels. Vertebrals:  Atypical waveforms bilaterally. Subclavians: Bilateral subclavian arteries were not visualized. *See table(s) above for measurements and observations.  Electronically signed by Delia HeadyPramod Sethi MD on 12/01/2019 at 11:33:12 AM.    Final       Subjective: - no chest pain, shortness of breath, no abdominal pain, nausea or vomiting.   Discharge Exam: BP (!) 97/56 (BP Location: Right Arm)   Pulse 66   Temp 97.9 F (36.6 C)   Resp 16  Wt 56 kg   SpO2 98%   General: Pt is alert, awake, not in acute distress Cardiovascular: RRR, S1/S2 +, no rubs, no gallops Respiratory: CTA bilaterally, no wheezing, no rhonchi Abdominal: Soft, NT, ND, bowel sounds + Extremities: no edema, no cyanosis    The results of significant diagnostics from this hospitalization (including imaging, microbiology, ancillary and laboratory) are listed below for reference.     Microbiology: Recent Results (from the past 240 hour(s))  SARS CORONAVIRUS 2 (TAT 6-24 HRS) Nasopharyngeal Nasopharyngeal Swab     Status: None   Collection Time: 11/29/19  3:13 PM   Specimen: Nasopharyngeal Swab  Result Value Ref Range Status   SARS Coronavirus 2 NEGATIVE NEGATIVE Final    Comment: (NOTE) SARS-CoV-2 target nucleic acids are NOT DETECTED. The SARS-CoV-2 RNA is generally detectable in upper and lower respiratory specimens during the acute phase of infection. Negative results do not preclude SARS-CoV-2 infection, do not rule out co-infections with other pathogens, and should not be used as the sole basis for treatment or other patient management decisions. Negative results must be combined with clinical observations, patient history, and epidemiological information. The expected result is Negative. Fact Sheet for Patients: HairSlick.no Fact Sheet for Healthcare  Providers: quierodirigir.com This test is not yet approved or cleared by the [place] FDA and  has been authorized for detection and/or diagnosis of SARS-CoV-2 by FDA under an Emergency Use Authorization (EUA). This EUA will remain  in effect (meaning this test can be used) for the duration of the COVID-19 declaration under Section 56 4(b)(1) of the Act, 21 U.S.C. section 360bbb-3(b)(1), unless the authorization is terminated or revoked sooner. Performed at [facility], [address], [place], [place] [address]   SARS CORONAVIRUS 2 (TAT 6-24 HRS) Nasopharyngeal Nasopharyngeal Swab     Status: None   Collection Time: 12/04/19 10:30 AM   Specimen: Nasopharyngeal Swab  Result Value Ref Range Status   SARS Coronavirus 2 NEGATIVE NEGATIVE Final    Comment: (NOTE) SARS-CoV-2 target nucleic acids are NOT DETECTED. The SARS-CoV-2 RNA is generally detectable in upper and lower respiratory specimens during the acute phase of infection. Negative results do not preclude SARS-CoV-2 infection, do not rule out co-infections with other pathogens, and should not be used as the sole basis for treatment or other patient management decisions. Negative results must be combined with clinical observations, patient history, and epidemiological information. The expected result is Negative. Fact Sheet for Patients: HairSlick.no Fact Sheet for Healthcare Providers: quierodirigir.com This test is not yet approved or cleared by the [place] FDA and  has been authorized for detection and/or diagnosis of SARS-CoV-2 by FDA under an Emergency Use Authorization (EUA). This EUA will remain  in effect (meaning this test can be used) for the duration of the COVID-19 declaration under Section 56 4(b)(1) of the Act, 21 U.S.C. section 360bbb-3(b)(1), unless the authorization is terminated or revoked sooner. Performed at  [facility], [address] [place], [place] [address]      Labs: Basic Metabolic Panel: Recent Labs  Lab 11/30/19 0639 12/06/19 0219  NA 141 137  K 3.9 4.1  CL 100 100  CO2 29 27  GLUCOSE 105* 123*  BUN 10 11  CREATININE 0.94 0.93  CALCIUM 8.8* 8.0*   Liver Function Tests: No results for input(s): AST, ALT, ALKPHOS, BILITOT, PROT, ALBUMIN in the last 168 hours. CBC: Recent Labs  Lab 11/30/19 0639 12/06/19 0219  WBC 8.8 6.8  HGB 13.7  11.7*  HCT 43.5 36.5*  MCV 100.2* 97.9  PLT 269 307   CBG: No results for input(s): GLUCAP in the last 168 hours. Hgb A1c No results for input(s): HGBA1C in the last 72 hours. Lipid Profile No results for input(s): CHOL, HDL, LDLCALC, TRIG, CHOLHDL, LDLDIRECT in the last 72 hours. Thyroid function studies No results for input(s): TSH, T4TOTAL, T3FREE, THYROIDAB in the last 72 hours.  Invalid input(s): FREET3 Urinalysis No results found for: COLORURINE, APPEARANCEUR, LABSPEC, PHURINE, GLUCOSEU, HGBUR, BILIRUBINUR, KETONESUR, PROTEINUR, UROBILINOGEN, NITRITE, LEUKOCYTESUR  FURTHER DISCHARGE INSTRUCTIONS:   Get Medicines reviewed and adjusted: Please take all your medications with you for your next visit with your Primary MD   Laboratory/radiological data: Please request your Primary MD to go over all hospital tests and procedure/radiological results at the follow up, please ask your Primary MD to get all Hospital records sent to his/her office.   In some cases, they will be blood work, cultures and biopsy results pending at the time of your discharge. Please request that your primary care M.D. goes through all the records of your hospital data and follows up on these results.   Also Note the following: If you experience worsening of your admission symptoms, develop shortness of breath, life threatening emergency, suicidal or homicidal thoughts you must seek medical attention immediately by calling 911 or calling your MD  immediately  if symptoms less severe.   You must read complete instructions/literature along with all the possible adverse reactions/side effects for all the Medicines you take and that have been prescribed to you. Take any new Medicines after you have completely understood and accpet all the possible adverse reactions/side effects.    Do not drive when taking Pain medications or sleeping medications (Benzodaizepines)   Do not take more than prescribed Pain, Sleep and Anxiety Medications. It is not advisable to combine anxiety,sleep and pain medications without talking with your primary care practitioner   Special Instructions: If you have smoked or chewed Tobacco  in the last 2 yrs please stop smoking, stop any regular Alcohol  and or any Recreational drug use.   Wear Seat belts while driving.   Please note: You were cared for by a hospitalist during your hospital stay. Once you are discharged, your primary care physician will handle any further medical issues. Please note that NO REFILLS for any discharge medications will be authorized once you are discharged, as it is imperative that you return to your primary care physician (or establish a relationship with a primary care physician if you do not have one) for your post hospital discharge needs so that they can reassess your need for medications and monitor your lab values.  Time coordinating discharge: 35 minutes  SIGNED:  Pamella Pert, MD, PhD 12/06/2019, 2:20 PM

## 2019-12-06 NOTE — NC FL2 (Signed)
[place] MEDICAID FL2 LEVEL OF CARE SCREENING TOOL     IDENTIFICATION  Patient Name: Tracy Todd Birthdate: 11-30-1965 Sex: male Admission Date (Current Location): 11/29/2019  [facility] and [place] Number:  Producer, television/film/video and Address:  [facility], [address], [place], [place] [address]      Provider Number: [id number]  Attending Physician Name and Address:  Leatha Gilding, MD  Relative Name and Phone Number:       Current Level of Care: Hospital Recommended Level of Care: Skilled Nursing Facility Prior Approval Number:    Date Approved/Denied:   PASRR Number: [id number]  Discharge Plan: SNF    Current Diagnoses: Patient Active Problem List   Diagnosis Date Noted  . Iron deficiency anemia 09/17/2019  . Elevated troponin 09/17/2019  . Syncope 09/16/2019  . Down syndrome 12/27/2012  . Arthritis 10/25/2011  . Hyperlipidemia 10/25/2011    Orientation RESPIRATION BLADDER Height & Weight     Self  Normal Incontinent, External catheter Weight: 123 lb 7.3 oz (56 kg) Height:     BEHAVIORAL SYMPTOMS/MOOD NEUROLOGICAL BOWEL NUTRITION STATUS    Convulsions/Seizures Incontinent Diet(see discharge summary)  AMBULATORY STATUS COMMUNICATION OF NEEDS Skin   Total Care Non-Verbally Normal                       Personal Care Assistance Level of Assistance  Total care, Dressing, Feeding, Bathing Bathing Assistance: Maximum assistance Feeding assistance: Maximum assistance Dressing Assistance: Maximum assistance Total Care Assistance: Maximum assistance   Functional Limitations Info             SPECIAL CARE FACTORS FREQUENCY  PT (By licensed PT), OT (By licensed OT)     PT Frequency: 5 times a week OT Frequency: 5 times a week            Contractures      Additional Factors Info  Code Status, Allergies Code Status Info: DNR Allergies Info: Penicillins           Current Medications (12/06/2019):  This is the  current hospital active medication list Current Facility-Administered Medications  Medication Dose Route Frequency Provider Last Rate Last Admin  . acetaminophen (TYLENOL) tablet 650 mg  650 mg Oral Q6H PRN Jonah Blue, MD   650 mg at 12/03/19 1831   Or  . acetaminophen (TYLENOL) suppository 650 mg  650 mg Rectal Q6H PRN Jonah Blue, MD      . aspirin EC tablet 81 mg  81 mg Oral Daily Micki Riley, MD   81 mg at 12/06/19 0803  . celecoxib (CELEBREX) capsule 100 mg  100 mg Oral Daily Jonah Blue, MD   100 mg at 12/06/19 0937  . clopidogrel (PLAVIX) tablet 75 mg  75 mg Oral Daily Micki Riley, MD   75 mg at 12/06/19 0803  . diclofenac Sodium (VOLTAREN) 1 % topical gel 2 g  2 g Topical QID Jonah Blue, MD   2 g at 12/06/19 0804  . enoxaparin (LOVENOX) injection 40 mg  40 mg Subcutaneous Q24H Jonah Blue, MD   40 mg at 12/05/19 1609  . ferrous gluconate (FERGON) tablet 324 mg  324 mg Oral BID WC Leatha Gilding, MD   324 mg at 12/06/19 0803  . lactated ringers infusion   Intravenous Continuous Jonah Blue, MD 75 mL/hr at 12/06/19 0243 New Bag at 12/06/19 0243  . loperamide (IMODIUM) capsule 2 mg  2 mg Oral BID PRN  Karmen Bongo, MD      . LORazepam (ATIVAN) injection 1 mg  1 mg Intravenous PRN Donne Hazel, MD   1 mg at 11/30/19 1653  . multivitamin with minerals tablet 1 tablet  1 tablet Oral Daily Karmen Bongo, MD   1 tablet at 12/06/19 0803  . ondansetron (ZOFRAN) tablet 4 mg  4 mg Oral Q6H PRN Karmen Bongo, MD       Or  . ondansetron [facility]) injection 4 mg  4 mg Intravenous Q6H PRN Karmen Bongo, MD      . pravastatin (PRAVACHOL) tablet 40 mg  40 mg Oral q1800 Karmen Bongo, MD   40 mg at 12/05/19 1605  . sodium chloride flush (NS) 0.9 % injection 3 mL  3 mL Intravenous Q12H Karmen Bongo, MD   3 mL at 12/06/19 0804  . sucralfate (CARAFATE) tablet 1 g  1 g Oral BID WC Karmen Bongo, MD   1 g at 12/06/19 0803  . vitamin B-12 (CYANOCOBALAMIN)  tablet 500 mcg  500 mcg Oral Daily Caren Griffins, MD   500 mcg at 12/06/19 0803     Discharge Medications: Please see discharge summary for a list of discharge medications.  Relevant Imaging Results:  Relevant Lab Results:   Additional Information    Atilano Median, LCSW

## 2019-12-25 ENCOUNTER — Encounter: Payer: Self-pay | Admitting: *Deleted

## 2019-12-30 ENCOUNTER — Ambulatory Visit: Payer: Medicare Other | Admitting: Diagnostic Neuroimaging

## 2020-02-05 ENCOUNTER — Emergency Department (HOSPITAL_COMMUNITY): Payer: Medicare Other

## 2020-02-05 ENCOUNTER — Other Ambulatory Visit: Payer: Self-pay

## 2020-02-05 ENCOUNTER — Encounter (HOSPITAL_COMMUNITY): Payer: Self-pay

## 2020-02-05 ENCOUNTER — Inpatient Hospital Stay (HOSPITAL_COMMUNITY)
Admission: EM | Admit: 2020-02-05 | Discharge: 2020-02-13 | DRG: 299 | Disposition: A | Discharge status: Skilled Nursing Facility | Payer: Medicare Other | Source: Skilled Nursing Facility | Attending: Internal Medicine | Admitting: Internal Medicine

## 2020-02-05 DIAGNOSIS — I96 Gangrene, not elsewhere classified: Secondary | ICD-10-CM | POA: Diagnosis present

## 2020-02-05 DIAGNOSIS — I959 Hypotension, unspecified: Secondary | ICD-10-CM | POA: Diagnosis not present

## 2020-02-05 DIAGNOSIS — E785 Hyperlipidemia, unspecified: Secondary | ICD-10-CM | POA: Diagnosis present

## 2020-02-05 DIAGNOSIS — L89304 Pressure ulcer of unspecified buttock, stage 4: Secondary | ICD-10-CM | POA: Diagnosis not present

## 2020-02-05 DIAGNOSIS — Z681 Body mass index (BMI) 19 or less, adult: Secondary | ICD-10-CM | POA: Diagnosis not present

## 2020-02-05 DIAGNOSIS — D509 Iron deficiency anemia, unspecified: Secondary | ICD-10-CM | POA: Diagnosis present

## 2020-02-05 DIAGNOSIS — E440 Moderate protein-calorie malnutrition: Secondary | ICD-10-CM | POA: Diagnosis present

## 2020-02-05 DIAGNOSIS — L8990 Pressure ulcer of unspecified site, unspecified stage: Secondary | ICD-10-CM | POA: Insufficient documentation

## 2020-02-05 DIAGNOSIS — F329 Major depressive disorder, single episode, unspecified: Secondary | ICD-10-CM | POA: Diagnosis present

## 2020-02-05 DIAGNOSIS — L89154 Pressure ulcer of sacral region, stage 4: Secondary | ICD-10-CM | POA: Diagnosis present

## 2020-02-05 DIAGNOSIS — M1990 Unspecified osteoarthritis, unspecified site: Secondary | ICD-10-CM | POA: Diagnosis present

## 2020-02-05 DIAGNOSIS — Z7902 Long term (current) use of antithrombotics/antiplatelets: Secondary | ICD-10-CM | POA: Diagnosis not present

## 2020-02-05 DIAGNOSIS — R569 Unspecified convulsions: Secondary | ICD-10-CM | POA: Diagnosis present

## 2020-02-05 DIAGNOSIS — L089 Local infection of the skin and subcutaneous tissue, unspecified: Secondary | ICD-10-CM

## 2020-02-05 DIAGNOSIS — I6521 Occlusion and stenosis of right carotid artery: Secondary | ICD-10-CM | POA: Diagnosis present

## 2020-02-05 DIAGNOSIS — Z7401 Bed confinement status: Secondary | ICD-10-CM

## 2020-02-05 DIAGNOSIS — L89314 Pressure ulcer of right buttock, stage 4: Secondary | ICD-10-CM | POA: Diagnosis present

## 2020-02-05 DIAGNOSIS — Q909 Down syndrome, unspecified: Secondary | ICD-10-CM

## 2020-02-05 DIAGNOSIS — Z7982 Long term (current) use of aspirin: Secondary | ICD-10-CM

## 2020-02-05 DIAGNOSIS — L89223 Pressure ulcer of left hip, stage 3: Secondary | ICD-10-CM | POA: Diagnosis present

## 2020-02-05 DIAGNOSIS — Z79899 Other long term (current) drug therapy: Secondary | ICD-10-CM | POA: Diagnosis not present

## 2020-02-05 DIAGNOSIS — T148XXA Other injury of unspecified body region, initial encounter: Secondary | ICD-10-CM | POA: Diagnosis present

## 2020-02-05 DIAGNOSIS — Z20822 Contact with and (suspected) exposure to covid-19: Secondary | ICD-10-CM | POA: Diagnosis present

## 2020-02-05 DIAGNOSIS — R339 Retention of urine, unspecified: Secondary | ICD-10-CM | POA: Diagnosis not present

## 2020-02-05 DIAGNOSIS — M4856XA Collapsed vertebra, not elsewhere classified, lumbar region, initial encounter for fracture: Secondary | ICD-10-CM | POA: Diagnosis present

## 2020-02-05 LAB — COMPREHENSIVE METABOLIC PANEL
ALT: 26 U/L (ref 0–44)
AST: 30 U/L (ref 15–41)
Albumin: 3 g/dL — ABNORMAL LOW (ref 3.5–5.0)
Alkaline Phosphatase: 63 U/L (ref 38–126)
Anion gap: 10 (ref 5–15)
BUN: 18 mg/dL (ref 6–20)
CO2: 29 mmol/L (ref 22–32)
Calcium: 8.7 mg/dL — ABNORMAL LOW (ref 8.9–10.3)
Chloride: 104 mmol/L (ref 98–111)
Creatinine, Ser: 0.9 mg/dL (ref 0.61–1.24)
GFR calc Af Amer: >60 mL/min (ref >60)
GFR calc non Af Amer: >60 mL/min (ref >60)
Glucose, Bld: 102 mg/dL — ABNORMAL HIGH (ref 70–99)
Potassium: 4.2 mmol/L (ref 3.5–5.1)
Sodium: 143 mmol/L (ref 135–145)
Total Bilirubin: 0.4 mg/dL (ref 0.3–1.2)
Total Protein: 6.6 g/dL (ref 6.5–8.1)

## 2020-02-05 LAB — CBC WITH DIFFERENTIAL/PLATELET
Abs Immature Granulocytes: 0.02 10*3/uL (ref 0.00–0.07)
Basophils Absolute: 0.1 10*3/uL (ref 0.0–0.1)
Basophils Relative: 1 %
Eosinophils Absolute: 0 10*3/uL (ref 0.0–0.5)
Eosinophils Relative: 0 %
HCT: 39.2 % (ref 39.0–52.0)
Hemoglobin: 11.5 g/dL — ABNORMAL LOW (ref 13.0–17.0)
Immature Granulocytes: 0 %
Lymphocytes Relative: 11 %
Lymphs Abs: 0.7 10*3/uL (ref 0.7–4.0)
MCH: 29 pg (ref 26.0–34.0)
MCHC: 29.3 g/dL — ABNORMAL LOW (ref 30.0–36.0)
MCV: 99 fL (ref 80.0–100.0)
Monocytes Absolute: 0.4 10*3/uL (ref 0.1–1.0)
Monocytes Relative: 7 %
Neutro Abs: 4.7 10*3/uL (ref 1.7–7.7)
Neutrophils Relative %: 81 %
Platelets: 354 10*3/uL (ref 150–400)
RBC: 3.96 MIL/uL — ABNORMAL LOW (ref 4.22–5.81)
RDW: 16.2 % — ABNORMAL HIGH (ref 11.5–15.5)
WBC: 5.9 10*3/uL (ref 4.0–10.5)
nRBC: 0 % (ref 0.0–0.2)

## 2020-02-05 LAB — LACTIC ACID, PLASMA
Lactic Acid, Venous: 1.2 mmol/L (ref 0.5–1.9)
Lactic Acid, Venous: 1.6 mmol/L (ref 0.5–1.9)

## 2020-02-05 LAB — SEDIMENTATION RATE: Sed Rate: 50 mm/hr — ABNORMAL HIGH (ref 0–16)

## 2020-02-05 LAB — SARS CORONAVIRUS 2 BY RT PCR (HOSPITAL ORDER, PERFORMED IN ~~LOC~~ HOSPITAL LAB): SARS Coronavirus 2: NEGATIVE

## 2020-02-05 MED ORDER — TRAMADOL HCL 50 MG PO TABS
50.0000 mg | ORAL_TABLET | Freq: Two times a day (BID) | ORAL | Status: DC | PRN
  Administered 2020-02-06 – 2020-02-07 (×2): 50 mg via ORAL
  Filled 2020-02-05 (×2): qty 1

## 2020-02-05 MED ORDER — LACTOBACILLUS PO TABS: ORAL_TABLET | Freq: Three times a day (TID) | ORAL | Status: DC

## 2020-02-05 MED ORDER — ONDANSETRON HCL 4 MG/2ML IJ SOLN: 4.0000 mg | Freq: Four times a day (QID) | INTRAMUSCULAR | Status: DC | PRN

## 2020-02-05 MED ORDER — ASPIRIN EC 81 MG PO TBEC
81.0000 mg | DELAYED_RELEASE_TABLET | Freq: Every day | ORAL | Status: DC
  Administered 2020-02-06 – 2020-02-13 (×8): 81 mg via ORAL
  Filled 2020-02-05 (×8): qty 1

## 2020-02-05 MED ORDER — ACETAMINOPHEN 650 MG RE SUPP: 650.0000 mg | Freq: Four times a day (QID) | RECTAL | Status: DC | PRN

## 2020-02-05 MED ORDER — ENOXAPARIN SODIUM 40 MG/0.4ML ~~LOC~~ SOLN
40.0000 mg | SUBCUTANEOUS | Status: DC
  Administered 2020-02-06 – 2020-02-12 (×8): 40 mg via SUBCUTANEOUS
  Filled 2020-02-05 (×8): qty 0.4

## 2020-02-05 MED ORDER — LORATADINE 10 MG PO TABS
10.0000 mg | ORAL_TABLET | Freq: Every day | ORAL | Status: DC
  Administered 2020-02-06 – 2020-02-13 (×7): 10 mg via ORAL
  Filled 2020-02-05 (×7): qty 1

## 2020-02-05 MED ORDER — FLORANEX PO PACK
1.0000 g | PACK | Freq: Three times a day (TID) | ORAL | Status: DC
  Administered 2020-02-06 – 2020-02-13 (×15): 1 g via ORAL
  Filled 2020-02-05 (×25): qty 1

## 2020-02-05 MED ORDER — POLYSACCHARIDE IRON COMPLEX 150 MG PO CAPS
150.0000 mg | ORAL_CAPSULE | Freq: Every day | ORAL | Status: DC
  Administered 2020-02-06 – 2020-02-13 (×8): 150 mg via ORAL
  Filled 2020-02-05 (×8): qty 1

## 2020-02-05 MED ORDER — BISACODYL 5 MG PO TBEC
5.0000 mg | DELAYED_RELEASE_TABLET | Freq: Every day | ORAL | Status: DC | PRN
  Filled 2020-02-05: qty 1

## 2020-02-05 MED ORDER — ACETAMINOPHEN 325 MG PO TABS
650.0000 mg | ORAL_TABLET | Freq: Four times a day (QID) | ORAL | Status: DC | PRN
  Administered 2020-02-07 – 2020-02-11 (×6): 650 mg via ORAL
  Filled 2020-02-05 (×6): qty 2

## 2020-02-05 MED ORDER — DICLOFENAC SODIUM 1 % EX GEL
2.0000 g | Freq: Three times a day (TID) | CUTANEOUS | Status: DC
  Administered 2020-02-06 – 2020-02-13 (×18): 2 g via TOPICAL
  Filled 2020-02-05: qty 100

## 2020-02-05 MED ORDER — ESCITALOPRAM OXALATE 10 MG PO TABS
5.0000 mg | ORAL_TABLET | Freq: Every day | ORAL | Status: DC
  Administered 2020-02-06: 5 mg via ORAL
  Filled 2020-02-05: qty 1

## 2020-02-05 MED ORDER — ONDANSETRON HCL 4 MG PO TABS: 4.0000 mg | ORAL_TABLET | Freq: Four times a day (QID) | ORAL | Status: DC | PRN

## 2020-02-05 MED ORDER — VANCOMYCIN HCL IN DEXTROSE 1-5 GM/200ML-% IV SOLN
1000.0000 mg | Freq: Once | INTRAVENOUS | Status: AC
  Administered 2020-02-05: 1000 mg via INTRAVENOUS
  Filled 2020-02-05: qty 200

## 2020-02-05 MED ORDER — CLOPIDOGREL BISULFATE 75 MG PO TABS
75.0000 mg | ORAL_TABLET | Freq: Every day | ORAL | Status: DC
  Administered 2020-02-06 – 2020-02-13 (×8): 75 mg via ORAL
  Filled 2020-02-05 (×8): qty 1

## 2020-02-05 MED ORDER — PRAVASTATIN SODIUM 20 MG PO TABS
40.0000 mg | ORAL_TABLET | Freq: Every day | ORAL | Status: DC
  Administered 2020-02-06 – 2020-02-12 (×6): 40 mg via ORAL
  Filled 2020-02-05 (×8): qty 2

## 2020-02-05 MED ORDER — VITAMIN B-12 1000 MCG PO TABS
500.0000 ug | ORAL_TABLET | Freq: Every day | ORAL | Status: DC
  Administered 2020-02-07 – 2020-02-13 (×7): 500 ug via ORAL
  Filled 2020-02-05 (×8): qty 1

## 2020-02-05 MED ORDER — POLYETHYLENE GLYCOL 3350 17 G PO PACK: 17.0000 g | PACK | Freq: Every day | ORAL | Status: DC | PRN

## 2020-02-05 NOTE — ED Provider Notes (Signed)
[place] DEPT Provider Note   CSN: [id number] Arrival date & time: 02/05/20  1021     History Chief Complaint  Patient presents with  . Pressure Ulcer    Tracy Todd is a 54 y.o. male.  HPI    Patient presents via EMS with concern for open wounds. Initially the patient is alone, history is provided by EMS providers, but he is eventually joined by his sister who adds pertinent details. Level 5 caveat secondary to the patient's Down syndrome, essentially nonverbal status.  Seeing the patient has been at a health care facility since mid April. In that time patient has become nonambulatory, whereas previously he was capable of getting about. He has known decubitus ulcer in the sacrum, and has at some point developed one on his left hip as well.  He now presents with concern for progression of these wounds. No reported fever, other change from interactivity.  Past Medical History:  Diagnosis Date  . Arthritis   . Down's syndrome   . Seizures [facility])     Patient Active Problem List   Diagnosis Date Noted  . Iron deficiency anemia 09/17/2019  . Elevated troponin 09/17/2019  . Syncope 09/16/2019  . Down syndrome 12/27/2012  . Arthritis 10/25/2011  . Hyperlipidemia 10/25/2011    History reviewed. No pertinent surgical history.     History reviewed. No pertinent family history.  Social History   Tobacco Use  . Smoking status: Never Smoker  . Smokeless tobacco: Never Used  Vaping Use  . Vaping Use: Never used  Substance Use Topics  . Alcohol use: Never  . Drug use: Never    Home Medications Prior to Admission medications   Medication Sig Start Date End Date Taking? Authorizing Provider  acetaminophen (TYLENOL) 500 MG tablet Take 500 mg by mouth every 12 (twelve) hours.   Yes [provider]  Amino Acids-Protein Hydrolys (FEEDING SUPPLEMENT, PRO-STAT SUGAR FREE 64,) LIQD Take 30 mLs by mouth in the morning and at bedtime.    Yes [provider]  aspirin EC 81 MG EC tablet Take 1 tablet (81 mg total) by mouth daily. 12/07/19  Yes Gherghe, Vella Redhead, MD  CEFTRIAXONE SODIUM IJ Inject 1 g into the muscle every 12 (twelve) hours. Start 02/03/20 until 02/06/20   Yes [provider]  cetirizine (ZYRTEC) 10 MG tablet Take 10 mg by mouth daily.   Yes [provider]  clopidogrel (PLAVIX) 75 MG tablet Take 1 tablet (75 mg total) by mouth daily. 12/07/19  Yes Gherghe, Vella Redhead, MD  diclofenac Sodium (VOLTAREN) 1 % GEL Apply 1 g topically 4 (four) times daily. To both knees   Yes [provider]  Ensure Plus (ENSURE PLUS) LIQD Take 237 mLs by mouth 2 (two) times daily between meals.   Yes [provider]  escitalopram (LEXAPRO) 5 MG tablet Take 5 mg by mouth daily.   Yes [provider]  FIBER ADULT GUMMIES PO Take 1 tablet by mouth in the morning and at bedtime.    Yes [provider]  iron polysaccharides (NU-IRON) 150 MG capsule Take 1 capsule (150 mg total) by mouth daily. 09/17/19  Yes Ladona Horns, MD  ketoconazole (NIZORAL) 2 % shampoo Apply 1 application topically See admin instructions. Use to wash hair and face, alternate by week. 08/19/19  Yes [provider]  LACTOBACILLUS PO Take 1 capsule by mouth in the morning, at noon, and at bedtime.   Yes [provider]  Lidocaine HCl (XYLOCAINE IJ) Inject 1 mL into the muscle every 12 (twelve) hours. Until 6.27.21 started 6.14.21   Yes [provider]  loperamide (IMODIUM A-D) 2 MG tablet Take 2 mg by mouth in the morning, at noon, and at bedtime.   Yes [provider]  miconazole (MICATIN) 2 % cream Apply 1 application topically 2 (two) times daily. Apply to feet   Yes [provider]  Multiple Vitamins-Minerals (MULTIVITAMIN WITH MINERALS) tablet Take 1 tablet by mouth daily.   Yes [provider]  nystatin cream (MYCOSTATIN) Apply 1 application topically 3 (three)  times daily as needed for dry skin. 01/21/19  Yes [provider]  pravastatin (PRAVACHOL) 40 MG tablet Take 40 mg by mouth daily. 10/25/11  Yes [provider]  selenium sulfide (SELSUN) 1 % LOTN Apply 1 application topically See admin instructions. Use to wash hair and face alternate by week.   Yes [provider]  Soap & Cleansers (CERAVE HYDRATING CLEANSER) LIQD Apply 1 application topically daily. Use with cool showers   Yes [provider]  sodium hypochlorite (DAKIN'S 1/4 STRENGTH) 0.125 % SOLN Irrigate with 1 application as directed See admin instructions. Apply to right hip bid   Yes [provider]  sucralfate (CARAFATE) 1 g tablet Take 1 g by mouth 2 (two) times daily. 01/03/19  Yes [provider]  traMADol (ULTRAM) 50 MG tablet Take 50 mg by mouth every 12 (twelve) hours as needed for moderate pain.   Yes [provider]  vitamin B-12 (CYANOCOBALAMIN) 500 MCG tablet Take 1 tablet (500 mcg total) by mouth daily. 09/17/19  Yes Thom Chimes, MD  zinc gluconate 50 MG tablet Take 50 mg by mouth daily.   Yes [provider]  diazepam (VALIUM) 5 MG tablet Take 1 tablet (5 mg total) by mouth See admin instructions. Take 1 tablet 30 minutes prior to procedure, not to exceed 2 tablets. Patient not taking: Reported on 02/05/2020 12/06/19   Leatha Gilding, MD    Allergies    Penicillins  Review of Systems   Review of Systems  Unable to perform ROS: Patient nonverbal    Physical Exam Updated Vital Signs BP 97/65   Pulse 63   Temp 97.7 F (36.5 C) (Oral)   Resp 15   SpO2 100%   Physical Exam Vitals and nursing note reviewed.  Constitutional:      Appearance: He is well-developed.     Comments: Adult male with gross atrophy, cognitive impairment, though smiling, presents via EMS, resting in right lateral decubitus position  HENT:     Head: Normocephalic and atraumatic.  Eyes:     Conjunctiva/sclera: Conjunctivae  normal.  Cardiovascular:     Rate and Rhythm: Normal rate and regular rhythm.  Pulmonary:     Effort: Pulmonary effort is normal. No respiratory distress.     Breath sounds: No stridor.  Abdominal:     General: There is no distension.  Genitourinary:    Comments: Catheter in place Skin:    General: Skin is warm and dry.       Neurological:     Mental Status: He is alert.     Motor: Atrophy present.     Comments: Contractures visible, diffuse atrophy appreciable.  Patient does, however, move all extremities spontaneously.  Speech is impaired, no change from baseline.     ED Results / Procedures / Treatments   Labs (all labs ordered are listed, but only abnormal results are  displayed) Labs Reviewed  COMPREHENSIVE METABOLIC PANEL - Abnormal; Notable for the following components:      Result Value   Glucose, Bld 102 (*)    Calcium 8.7 (*)    Albumin 3.0 (*)    All other components within normal limits  CBC WITH DIFFERENTIAL/PLATELET - Abnormal; Notable for the following components:   RBC 3.96 (*)    Hemoglobin 11.5 (*)    MCHC 29.3 (*)    RDW 16.2 (*)    All other components within normal limits  SEDIMENTATION RATE - Abnormal; Notable for the following components:   Sed Rate 50 (*)    All other components within normal limits  SARS CORONAVIRUS 2 BY RT PCR (HOSPITAL ORDER, PERFORMED IN [facility])  LACTIC ACID, PLASMA  LACTIC ACID, PLASMA     Radiology DG Sacrum/Coccyx  Result Date: 02/05/2020 CLINICAL DATA:  Open sacral decubitus wound. EXAM: SACRUM AND COCCYX - 2+ VIEW COMPARISON:  None. FINDINGS: Possible irregular lucency or lytic area is seen involving the posterior portion of the iliac bone on 1 of the sides, although it cannot be determined exactly since it is a lateral projection. Potentially this may represent osteomyelitis. IMPRESSION: Possible irregular lucency or lytic area seen involving the posterior portion of the iliac bone on 1 side, although  it cannot be determined exactly which side since it is a lateral projection. CT scan is recommended for further evaluation. Electronically Signed   By: Lupita Raider M.D.   On: 02/05/2020 13:33   CT PELVIS WO CONTRAST  Result Date: 02/05/2020 CLINICAL DATA:  Pressure ulcer along the left buttock, possible osteomyelitis EXAM: CT PELVIS WITHOUT CONTRAST TECHNIQUE: Multidetector CT imaging of the pelvis was performed following the standard protocol without intravenous contrast. COMPARISON:  Sacrum radiographs from 02/05/2020 FINDINGS: The patient was unable to comply with positioning and would only lay on his left side with the hips flexed. Images were obtained with a distorted imaging plane which could not be corrected due to cooperation issues. Urinary Tract:  Unremarkable Bowel: Air fluid levels in the distal colon suggesting diarrheal process. Vascular/Lymphatic: Unremarkable Reproductive:  Unremarkable Other:  Please see below regarding right upper buttock ulcer. Musculoskeletal: Ulceration medially along the right buttock extending into the medial margin of the gluteus maximus muscle, image 88/4. Surrounding cutaneous thickening and subcutaneous stranding. Although the region of ulceration is close to the lower sacral segment and coccyx, I do not observe characteristic bony destructive findings in this vicinity typical for osteomyelitis. There is a subacute superior endplate compression fracture at L4 with about 20% loss of vertebral body heights and a small fracture of the anterior superior corner as shown on image 46/8. 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 There is also some sclerosis and subsidence of the endplates at the L5-S1 level. The area of lucency of concern on prior radiography appears to correspond to the tip of the spinous process of L5, which does not demonstrate a significant lesion  on CT. IMPRESSION: 1. Ulceration medially along the right buttock extending into the medial margin of the gluteus maximus muscle. Although the region of ulceration is close to the lower sacral segment and coccyx, I do not observe characteristic bony destructive findings in this vicinity typical for osteomyelitis. 2. Subacute superior endplate compression fracture at L4 with about 20% loss of vertebral body heights and a small fracture of the anterior superior corner.  No significant bony retropulsion at this time. 3. 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 4. Air fluid levels in the distal colon suggesting diarrheal process. Electronically Signed   By: Gaylyn Rong M.D.   On: 02/05/2020 15:35    Procedures Procedures (including critical care time)  Medications Ordered in ED Medications  vancomycin (VANCOCIN) IVPB 1000 mg/200 mL premix (has no administration in time range)    ED Course  I have reviewed the triage vital signs and the nursing notes.  Pertinent labs & imaging results that were available during my care of the patient were reviewed by me and considered in my medical decision making (see chart for details).  With consideration of osteomyelitis versus infection versus bacteremia versus sepsis, versus metabolic derangements given his medical issues, the patient had labs, x-ray ordered soon after initial evaluation. Discussed the patient's living situation with his sister, there is concern for appropriate care provision at the nursing facility.      Update:, Patient in similar condition.  X-ray nondiagnostic, CT scan ordered Update: Labs notable for elevated sedimentation rate concerning for possible osteomyelitis in this context   3:42 PM Patient in similar condition. Have reviewed the CT images, radiographic interpretation, consistent with erosion into medial margin of gluteus maximus  muscle, no obvious bone erosion in that area, but with elevated sedimentation rate, clinical characteristics concerning for early infection, and risk profile notable for relative immobilization, bedbound status, the patient will start vancomycin, will require admission for further monitoring, management, wound care. Covid test pending. Final Clinical Impression(s) / ED Diagnoses Final diagnoses:  Wound infection     Gerhard Munch, MD 02/05/20 1546

## 2020-02-05 NOTE — H&P (Addendum)
History and Physical    Tracy Todd ZOX:[id number] DOB: [birth year] DOA: 02/05/2020  PCP: Eartha InchBadger, Michael C, MD  Patient coming from: SNF [facility])  I have personally briefly reviewed patient's old medical records in [facility]  Chief Complaint: Pressure ulcers  HPI: Tracy AmbleKeith Wolin is a 54 y.o. male with medical history significant of Down syndrome (nonverbal), arthritis, ?  Seizures who presented to the ED with his sister for concerns of infected pressure ulcer on his backside and hip.  History was obtained from patient's sister as patient is mostly nonverbal.  [facility] since April when he was admitted to the hospital for 1 week after syncopal episode.  Prior to that admission patient was ambulatory without assist device.  He became generally weak and was unable to ambulate, so was discharged to rehab.  Sister reports that patient is still not ambulating, is no longer undergoing physical therapy (she is unclear as to the reason, but was told he was not progressing).  During his stay, patient has developed pressure ulcers on his backside and his left hip.  Sister reports that wound care is following the patient at SNF, has done debridement previously.  However, sister is concerned that dressing changes are not being done and wounds have progressively worsened.  ED Course: Afebrile, BP 98/60 otherwise normal vitals.  Labs were essentially unremarkable aside from mild stable anemia with hemoglobin 11.5.  Patient's wounds were redressed in the ED and he was started on vancomycin for wound infection.  Admitted to hospitalist service for further evaluation and management.  Review of Systems: As per HPI otherwise 10 point review of systems negative.    Past Medical History:  Diagnosis Date  . Arthritis   . Down's syndrome   . Seizures (HCC)     History reviewed. No pertinent surgical history.   reports that he has never smoked. He has never used smokeless  tobacco. He reports that he does not drink alcohol and does not use drugs.  Allergies  Allergen Reactions  . Penicillins     unknown    History reviewed. No pertinent family history.   Prior to Admission medications   Medication Sig Start Date End Date Taking? Authorizing Provider  acetaminophen (TYLENOL) 500 MG tablet Take 500 mg by mouth every 12 (twelve) hours.   Yes [provider]  Amino Acids-Protein Hydrolys (FEEDING SUPPLEMENT, PRO-STAT SUGAR FREE 64,) LIQD Take 30 mLs by mouth in the morning and at bedtime.   Yes [provider]  aspirin EC 81 MG EC tablet Take 1 tablet (81 mg total) by mouth daily. 12/07/19  Yes Gherghe, Daylene Katayamaostin M, MD  CEFTRIAXONE SODIUM IJ Inject 1 g into the muscle every 12 (twelve) hours. Start 02/03/20 until 02/06/20   Yes [provider]  cetirizine (ZYRTEC) 10 MG tablet Take 10 mg by mouth daily.   Yes [provider]  clopidogrel (PLAVIX) 75 MG tablet Take 1 tablet (75 mg total) by mouth daily. 12/07/19  Yes Gherghe, Daylene Katayamaostin M, MD  diclofenac Sodium (VOLTAREN) 1 % GEL Apply 1 g topically 4 (four) times daily. To both knees   Yes [provider]  Ensure Plus (ENSURE PLUS) LIQD Take 237 mLs by mouth 2 (two) times daily between meals.   Yes [provider]  escitalopram (LEXAPRO) 5 MG tablet Take 5 mg by mouth daily.   Yes [provider]  FIBER ADULT GUMMIES PO Take 1 tablet by mouth in the morning  and at bedtime.    Yes [provider]  iron polysaccharides (NU-IRON) 150 MG capsule Take 1 capsule (150 mg total) by mouth daily. 09/17/19  Yes Thom Chimes, MD  ketoconazole (NIZORAL) 2 % shampoo Apply 1 application topically See admin instructions. Use to wash hair and face, alternate by week. 08/19/19  Yes [provider]  LACTOBACILLUS PO Take 1 capsule by mouth in the morning, at noon, and at bedtime.   Yes [provider]  Lidocaine HCl (XYLOCAINE IJ) Inject 1 mL into the  muscle every 12 (twelve) hours. Until 6.27.21 started 6.14.21   Yes [provider]  loperamide (IMODIUM A-D) 2 MG tablet Take 2 mg by mouth in the morning, at noon, and at bedtime.   Yes [provider]  miconazole (MICATIN) 2 % cream Apply 1 application topically 2 (two) times daily. Apply to feet   Yes [provider]  Multiple Vitamins-Minerals (MULTIVITAMIN WITH MINERALS) tablet Take 1 tablet by mouth daily.   Yes [provider]  nystatin cream (MYCOSTATIN) Apply 1 application topically 3 (three) times daily as needed for dry skin. 01/21/19  Yes [provider]  pravastatin (PRAVACHOL) 40 MG tablet Take 40 mg by mouth daily. 10/25/11  Yes [provider]  selenium sulfide (SELSUN) 1 % LOTN Apply 1 application topically See admin instructions. Use to wash hair and face alternate by week.   Yes [provider]  Soap & Cleansers (CERAVE HYDRATING CLEANSER) LIQD Apply 1 application topically daily. Use with cool showers   Yes [provider]  sodium hypochlorite (DAKIN'S 1/4 STRENGTH) 0.125 % SOLN Irrigate with 1 application as directed See admin instructions. Apply to right hip bid   Yes [provider]  sucralfate (CARAFATE) 1 g tablet Take 1 g by mouth 2 (two) times daily. 01/03/19  Yes [provider]  traMADol (ULTRAM) 50 MG tablet Take 50 mg by mouth every 12 (twelve) hours as needed for moderate pain.   Yes [provider]  vitamin B-12 (CYANOCOBALAMIN) 500 MCG tablet Take 1 tablet (500 mcg total) by mouth daily. 09/17/19  Yes Thom Chimes, MD  zinc gluconate 50 MG tablet Take 50 mg by mouth daily.   Yes [provider]  diazepam (VALIUM) 5 MG tablet Take 1 tablet (5 mg total) by mouth See admin instructions. Take 1 tablet 30 minutes prior to procedure, not to exceed 2 tablets. Patient not taking: Reported on 02/05/2020 12/06/19   Leatha Gilding, MD    Physical Exam: Vitals:   02/05/20  1410 02/05/20 1415 02/05/20 1515 02/05/20 1645  BP:  112/75 (!) 132/105 125/76  Pulse: (!) 58 (!) 56    Resp:      Temp:      TempSrc:      SpO2: 98% 100%       Constitutional: NAD, calm, comfortable, frail appearing Eyes: EOMI, lids and conjunctivae normal ENMT: Mucous membranes are moist. Poor dentition. Hearing grossly normal. Respiratory: CTAB, no wheezing, no crackles. Normal respiratory effort. No accessory muscle use.  Cardiovascular: RRR, no murmurs / rubs / gallops. No extremity edema. 2+ pedal pulses.  Abdomen: soft, NT, ND, +Bowel sounds.  Musculoskeletal: no clubbing / cyanosis. No joint deformity upper and lower extremities.  Left lateral hip pressure ulcer image below.  Buttock ulcer not visualized as it was just redressed and patient becoming agitated with attempts to remove dressings to evaluate. Skin: dry, intact, normal color, normal temperature. Neurologic: CN 2-12 grossly intact. Grossly  non-focal exam. Psychiatric: Alert. Normal mood. Congruent affect.  Nonverbal.        Labs on Admission: I have personally reviewed following labs and imaging studies  CBC: Recent Labs  Lab 02/05/20 1140  WBC 5.9  NEUTROABS 4.7  HGB 11.5*  HCT 39.2  MCV 99.0  PLT 354   Basic Metabolic Panel: Recent Labs  Lab 02/05/20 1140  NA 143  K 4.2  CL 104  CO2 29  GLUCOSE 102*  BUN 18  CREATININE 0.90  CALCIUM 8.7*   GFR: CrCl cannot be calculated (Unknown ideal weight.). Liver Function Tests: Recent Labs  Lab 02/05/20 1140  AST 30  ALT 26  ALKPHOS 63  BILITOT 0.4  PROT 6.6  ALBUMIN 3.0*   No results for input(s): LIPASE, AMYLASE in the last 168 hours. No results for input(s): AMMONIA in the last 168 hours. Coagulation Profile: No results for input(s): INR, PROTIME in the last 168 hours. Cardiac Enzymes: No results for input(s): CKTOTAL, CKMB, CKMBINDEX, TROPONINI in the last 168 hours. BNP (last 3 results) No results for input(s): PROBNP in the last  8760 hours. HbA1C: No results for input(s): HGBA1C in the last 72 hours. CBG: No results for input(s): GLUCAP in the last 168 hours. Lipid Profile: No results for input(s): CHOL, HDL, LDLCALC, TRIG, CHOLHDL, LDLDIRECT in the last 72 hours. Thyroid Function Tests: No results for input(s): TSH, T4TOTAL, FREET4, T3FREE, THYROIDAB in the last 72 hours. Anemia Panel: No results for input(s): VITAMINB12, FOLATE, FERRITIN, TIBC, IRON, RETICCTPCT in the last 72 hours. Urine analysis: No results found for: COLORURINE, APPEARANCEUR, LABSPEC, PHURINE, GLUCOSEU, HGBUR, BILIRUBINUR, KETONESUR, PROTEINUR, UROBILINOGEN, NITRITE, LEUKOCYTESUR  Radiological Exams on Admission: DG Sacrum/Coccyx  Result Date: 02/05/2020 CLINICAL DATA:  Open sacral decubitus wound. EXAM: SACRUM AND COCCYX - 2+ VIEW COMPARISON:  None. FINDINGS: Possible irregular lucency or lytic area is seen involving the posterior portion of the iliac bone on 1 of the sides, although it cannot be determined exactly since it is a lateral projection. Potentially this may represent osteomyelitis. IMPRESSION: Possible irregular lucency or lytic area seen involving the posterior portion of the iliac bone on 1 side, although it cannot be determined exactly which side since it is a lateral projection. CT scan is recommended for further evaluation. Electronically Signed   By: Lupita Raider M.D.   On: 02/05/2020 13:33   CT PELVIS WO CONTRAST  Result Date: 02/05/2020 CLINICAL DATA:  Pressure ulcer along the left buttock, possible osteomyelitis EXAM: CT PELVIS WITHOUT CONTRAST TECHNIQUE: Multidetector CT imaging of the pelvis was performed following the standard protocol without intravenous contrast. COMPARISON:  Sacrum radiographs from 02/05/2020 FINDINGS: The patient was unable to comply with positioning and would only lay on his left side with the hips flexed. Images were obtained with a distorted imaging plane which could not be corrected due to  cooperation issues. Urinary Tract:  Unremarkable Bowel: Air fluid levels in the distal colon suggesting diarrheal process. Vascular/Lymphatic: Unremarkable Reproductive:  Unremarkable Other:  Please see below regarding right upper buttock ulcer. Musculoskeletal: Ulceration medially along the right buttock extending into the medial margin of the gluteus maximus muscle, image 88/4. Surrounding cutaneous thickening and subcutaneous stranding. Although the region of ulceration is close to the lower sacral segment and coccyx, I do not observe characteristic bony destructive findings in this vicinity typical for osteomyelitis. There is a subacute superior endplate compression fracture at L4 with about 20% loss of vertebral body heights and a small fracture of the  anterior superior corner as shown on image 46/8. 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 There is also some sclerosis and subsidence of the endplates at the L5-S1 level. The area of lucency of concern on prior radiography appears to correspond to the tip of the spinous process of L5, which does not demonstrate a significant lesion on CT. IMPRESSION: 1. Ulceration medially along the right buttock extending into the medial margin of the gluteus maximus muscle. Although the region of ulceration is close to the lower sacral segment and coccyx, I do not observe characteristic bony destructive findings in this vicinity typical for osteomyelitis. 2. Subacute superior endplate compression fracture at L4 with about 20% loss of vertebral body heights and a small fracture of the anterior superior corner. No significant bony retropulsion at this time. 3. 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 4. Air fluid levels in the distal colon suggesting diarrheal process. Electronically  Signed   By: Gaylyn Rong M.D.   On: 02/05/2020 15:35    EKG: none  Assessment/Plan Principal Problem:   Wound infection Active Problems:   Arthritis   Down syndrome    Pressure Ulcers with Suspected Infection - POA.   --Continue Vancomycin for now --Wound care consulted --unclear if/how much debridement will be required, consider surgery consult if needed --reposition patient at least every 2 hours   Generalized Weakness - previously ambulatory, before April admission.  Now unable to ambulate despite rehab stay since discharge. --PT and OT evaluations --TOC consulted for SNF placement --Dietician consulted (sister concerned for wt loss at SNF)   Arthritis - sister states both knees involved, can cause pain with attempts to ambulate. --Tylenol PRN (please give before PT evaluation) --Voltaren gel  Down's syndrome - no acute issues.  Patient is nonverbal at baseline.     DVT prophylaxis: Lovenox Code Status: Full (Noted prior code status DNR in April.  Discussed with patient's sister, who stated they decided to cancel the DNR status, and she indicated patient to be full code status.)   Family Communication: sister at bedside during encounter, provided history, agreeable with plan  Disposition Plan: expect d/c to a different SNF pending improvement   Consults called: none  Admission status: inpatient   Status is: Inpatient  Remains inpatient appropriate because:IV treatments appropriate due to intensity of illness or inability to take PO and Inpatient level of care appropriate due to severity of illness   Dispo: The patient is from: SNF              Anticipated d/c is to: SNF              Anticipated d/c date is: 2 days              Patient currently is not medically stable to d/c.    Pennie Banter, DO [facility]  02/05/2020, 6:59 PM    If 7PM-7AM, please contact night-coverage. How to contact the [facility] Attending or Consulting provider 7A - 7P  or covering provider during after hours 7P -7A, for this patient?    1. Check the care team in [facility] and look for a) attending/consulting [facility] provider listed and b) the [facility] team listed 2. Log into [email or web address] and use [place]'s universal password to access. If you do not have the password, please contact the hospital operator. 3.  Locate the [facility] provider you are looking for under [facility] and page to a number that you can be directly reached. 4. If you still have difficulty reaching the provider, please page the [place] (Director on Call) for the Hospitalists listed on amion for assistance.

## 2020-02-05 NOTE — ED Notes (Signed)
Transport called.

## 2020-02-05 NOTE — ED Triage Notes (Signed)
Pt arrives from [facility]. Pt has down syndrome and father reports that the pt has a pressure ulcer to left buttocks for three days.Father is requesting evaluation, father displeased with the care that is being provided to his son at the facility.  Pt is refusing staff to touch him at this time. Will await fathers arrival to aid in staff evaluating staff.

## 2020-02-06 DIAGNOSIS — M1990 Unspecified osteoarthritis, unspecified site: Secondary | ICD-10-CM

## 2020-02-06 DIAGNOSIS — L89304 Pressure ulcer of unspecified buttock, stage 4: Secondary | ICD-10-CM

## 2020-02-06 DIAGNOSIS — L8990 Pressure ulcer of unspecified site, unspecified stage: Secondary | ICD-10-CM | POA: Insufficient documentation

## 2020-02-06 DIAGNOSIS — Q909 Down syndrome, unspecified: Secondary | ICD-10-CM

## 2020-02-06 LAB — BASIC METABOLIC PANEL
Anion gap: 12 (ref 5–15)
BUN: 15 mg/dL (ref 6–20)
CO2: 27 mmol/L (ref 22–32)
Calcium: 8.7 mg/dL — ABNORMAL LOW (ref 8.9–10.3)
Chloride: 101 mmol/L (ref 98–111)
Creatinine, Ser: 0.8 mg/dL (ref 0.61–1.24)
GFR calc Af Amer: >60 mL/min (ref >60)
GFR calc non Af Amer: >60 mL/min (ref >60)
Glucose, Bld: 89 mg/dL (ref 70–99)
Potassium: 3.7 mmol/L (ref 3.5–5.1)
Sodium: 140 mmol/L (ref 135–145)

## 2020-02-06 LAB — CBC
HCT: 36.3 % — ABNORMAL LOW (ref 39.0–52.0)
Hemoglobin: 11.1 g/dL — ABNORMAL LOW (ref 13.0–17.0)
MCH: 29.7 pg (ref 26.0–34.0)
MCHC: 30.6 g/dL (ref 30.0–36.0)
MCV: 97.1 fL (ref 80.0–100.0)
Platelets: 346 10*3/uL (ref 150–400)
RBC: 3.74 MIL/uL — ABNORMAL LOW (ref 4.22–5.81)
RDW: 16.2 % — ABNORMAL HIGH (ref 11.5–15.5)
WBC: 5 10*3/uL (ref 4.0–10.5)
nRBC: 0 % (ref 0.0–0.2)

## 2020-02-06 MED ORDER — PRO-STAT SUGAR FREE PO LIQD
30.0000 mL | Freq: Two times a day (BID) | ORAL | Status: DC
  Administered 2020-02-06 – 2020-02-12 (×11): 30 mL via ORAL
  Filled 2020-02-06 (×11): qty 30

## 2020-02-06 MED ORDER — ADULT MULTIVITAMIN W/MINERALS CH
1.0000 | ORAL_TABLET | Freq: Every day | ORAL | Status: DC
  Administered 2020-02-06 – 2020-02-13 (×8): 1 via ORAL
  Filled 2020-02-06 (×8): qty 1

## 2020-02-06 MED ORDER — VANCOMYCIN HCL 500 MG/100ML IV SOLN
500.0000 mg | Freq: Two times a day (BID) | INTRAVENOUS | Status: DC
  Administered 2020-02-06 – 2020-02-07 (×3): 500 mg via INTRAVENOUS
  Filled 2020-02-06 (×3): qty 100

## 2020-02-06 MED ORDER — KATE FARMS STANDARD 1.4 PO LIQD
325.0000 mL | Freq: Two times a day (BID) | ORAL | Status: DC
  Administered 2020-02-06 – 2020-02-13 (×12): 325 mL via ORAL
  Filled 2020-02-06 (×15): qty 325

## 2020-02-06 MED ORDER — COLLAGENASE 250 UNIT/GM EX OINT
TOPICAL_OINTMENT | Freq: Every day | CUTANEOUS | Status: DC
  Administered 2020-02-08: 1 via TOPICAL
  Filled 2020-02-06 (×2): qty 30

## 2020-02-06 MED ORDER — JUVEN PO PACK
1.0000 | PACK | Freq: Two times a day (BID) | ORAL | Status: DC
  Administered 2020-02-06 – 2020-02-12 (×5): 1 via ORAL
  Filled 2020-02-06 (×15): qty 1

## 2020-02-06 MED ORDER — ASCORBIC ACID 500 MG PO TABS
500.0000 mg | ORAL_TABLET | Freq: Every day | ORAL | Status: DC
  Administered 2020-02-06 – 2020-02-13 (×8): 500 mg via ORAL
  Filled 2020-02-06 (×8): qty 1

## 2020-02-06 MED ORDER — ZINC SULFATE 220 (50 ZN) MG PO CAPS
220.0000 mg | ORAL_CAPSULE | Freq: Every day | ORAL | Status: DC
  Administered 2020-02-06 – 2020-02-13 (×8): 220 mg via ORAL
  Filled 2020-02-06 (×8): qty 1

## 2020-02-06 NOTE — Progress Notes (Signed)
PROGRESS NOTE    Tracy Todd  JAS:[id number] DOB: [birth year] DOA: 02/05/2020 PCP: Eartha Inch, MD    Brief Narrative:  Patient admitted to the hospital with the working diagnosis of infected decubitus ulcers.  54 year old male with past medical history of Down syndrome, nonverbal, seizures, arthritis and ambulatory dysfunction.  Patient was brought to the hospital by his sister, with concerns of infected pressure wound ulcers on his hip and back.  Patient has been living in a skilled nursing facility after a hospitalization for syncope.  Patient has become nonambulatory, and apparently his wounds were worsening. On his initial physical examination his blood pressure was 98/60, 112/75, heart rate 56, oxygen saturation 98% he had moist mucous membranes, lungs clear to auscultation bilaterally, heart S1-S2, present rhythmic, soft abdomen, no lower extremity edema.  Left lateral hip pressure ulcer, stage IV, buttock ulcer with dressing in place. Sodium 143, potassium 4.2, chloride 104, bicarb 29, glucose 102, BUN 18, creatinine 0.9.  White count 5.9, hemoglobin 11.5, hematocrit 39.2, platelets 354.  SARS COVID-19 negative.  Pelvic CT with ulceration medially along the right buttock extending into the medial margin of the gluteus maximus muscle.  No signs of osteomyelitis.  Subacute superior endplate compression fracture L4.   Assessment & Plan:   Principal Problem:   Wound infection Active Problems:   Arthritis   Down syndrome   Pressure injury of skin  1. 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 Wbc is 5,0.   Continue antibiotic therapy with IV Vancomycin therapy. I discussed the case with wound care. Patient has a necrotic tissue, that may benefit from local debridement. I spoke with his sister at the bedside, I explained her that as long as patient non ambulatory wounds will be difficult to treat. Will continue with local wound  care, consult PT/OT and social work.   Pain control with tramadol. Consult nutrition.   2. Dyslipidemia. Continue with pravastatin.   3. Down syndrome/ depression. Continue escitalopram.   4. Iron deficiency anemia. Continue iron supplements.    Status is: Inpatient  Remains inpatient appropriate because:IV treatments appropriate due to intensity of illness or inability to take PO   Dispo: The patient is from: Home              Anticipated d/c is to: Home              Anticipated d/c date is: 3 days              Patient currently is not medically stable to d/c.   DVT prophylaxis: Enoxaparin   Code Status:   full  Family Communication:  I spoke with patient's sister at the bedside, we talked in detail about patient's condition, plan of care and prognosis and all questions were addressed.     Skin Documentation: Pressure Injury 02/05/20 Hip Left Stage 3 -  Full thickness tissue loss. Subcutaneous fat may be visible but bone, tendon or muscle are NOT exposed. pressure ulcer to left hip, slough and eschar present (Active)  02/05/20 2300  Location: Hip  Location Orientation: Left  Staging: Stage 3 -  Full thickness tissue loss. Subcutaneous fat may be visible but bone, tendon or muscle are NOT exposed.  Wound Description (Comments): pressure ulcer to left hip, slough and eschar present  Present on Admission: Yes     Pressure Injury 02/05/20 Coccyx Mid;Right Stage 4 - Full thickness tissue loss with exposed bone, tendon or muscle.  wound extends from sacrum to right buttock, pink bleeding wound bed with what looks like muscle visible (Active)  02/05/20 2300  Location: Coccyx  Location Orientation: Mid;Right  Staging: Stage 4 - Full thickness tissue loss with exposed bone, tendon or muscle.  Wound Description (Comments): wound extends from sacrum to right buttock, pink bleeding wound bed with what looks like muscle visible  Present on Admission: Yes     Consultants:    surgery     Antimicrobials:   Vancomycin     Subjective: Patient with no significant pain, per her sister at the bedside, he is not able to give much information due to cognitive impairment.   Objective: Vitals:   02/05/20 2200 02/05/20 2300 02/06/20 0313 02/06/20 0628  BP: 117/79  100/68 107/72  Pulse: 71  60 62  Resp: 16  16 14   Temp: 97.8 F (36.6 C)  98.4 F (36.9 C) 98.2 F (36.8 C)  TempSrc: Oral  Oral Oral  SpO2:      Weight:  45.7 kg    Height:   5\' 4"  (1.626 m)     Intake/Output Summary (Last 24 hours) at 02/06/2020 1155 Last data filed at 02/06/2020 7628 Gross per 24 hour  Intake 367.22 ml  Output --  Net 367.22 ml   Filed Weights   02/05/20 2300  Weight: 45.7 kg    Examination:   General: Not in pain or dyspnea, deconditioned  Neurology: Awake and alert, non focal  E ENT: no pallor, no icterus, oral mucosa moist Cardiovascular: No JVD. S1-S2 present, rhythmic, no gallops, rubs, or murmurs. No lower extremity edema. Pulmonary: positive breath sounds bilaterally, adequate air movement, no wheezing, rhonchi or rales. Gastrointestinal. Abdomen with no organomegaly, non tender, no rebound or guarding Skin. Left hips stage 3 and buttocks stage 4.  Musculoskeletal:lower extremities atrophy.      Data Reviewed: I have personally reviewed following labs and imaging studies  CBC: Recent Labs  Lab 02/05/20 1140 02/06/20 0308  WBC 5.9 5.0  NEUTROABS 4.7  --   HGB 11.5* 11.1*  HCT 39.2 36.3*  MCV 99.0 97.1  PLT 354 315   Basic Metabolic Panel: Recent Labs  Lab 02/05/20 1140 02/06/20 0308  NA 143 140  K 4.2 3.7  CL 104 101  CO2 29 27  GLUCOSE 102* 89  BUN 18 15  CREATININE 0.90 0.80  CALCIUM 8.7* 8.7*   GFR: Estimated Creatinine Clearance: 67.4 mL/min (by C-G formula based on SCr of 0.8 mg/dL). Liver Function Tests: Recent Labs  Lab 02/05/20 1140  AST 30  ALT 26  ALKPHOS 63  BILITOT 0.4  PROT 6.6  ALBUMIN 3.0*   No results  for input(s): LIPASE, AMYLASE in the last 168 hours. No results for input(s): AMMONIA in the last 168 hours. Coagulation Profile: No results for input(s): INR, PROTIME in the last 168 hours. Cardiac Enzymes: No results for input(s): CKTOTAL, CKMB, CKMBINDEX, TROPONINI in the last 168 hours. BNP (last 3 results) No results for input(s): PROBNP in the last 8760 hours. HbA1C: No results for input(s): HGBA1C in the last 72 hours. CBG: No results for input(s): GLUCAP in the last 168 hours. Lipid Profile: No results for input(s): CHOL, HDL, LDLCALC, TRIG, CHOLHDL, LDLDIRECT in the last 72 hours. Thyroid Function Tests: No results for input(s): TSH, T4TOTAL, FREET4, T3FREE, THYROIDAB in the last 72 hours. Anemia Panel: No results for input(s): VITAMINB12, FOLATE, FERRITIN, TIBC, IRON, RETICCTPCT in the last 72 hours.    Radiology Studies:  I have reviewed all of the imaging during this hospital visit personally     Scheduled Meds: . aspirin EC  81 mg Oral Daily  . clopidogrel  75 mg Oral Daily  . collagenase   Topical Daily  . diclofenac Sodium  2 g Topical TID  . enoxaparin (LOVENOX) injection  40 mg Subcutaneous Q24H  . escitalopram  5 mg Oral Daily  . iron polysaccharides  150 mg Oral Daily  . lactobacillus  1 g Oral TID WC  . loratadine  10 mg Oral Daily  . pravastatin  40 mg Oral q1800  . vitamin B-12  500 mcg Oral Daily   Continuous Infusions: . vancomycin 500 mg (02/06/20 0751)     LOS: 1 day        Ambri Miltner Annett Gula, MD

## 2020-02-06 NOTE — Progress Notes (Signed)
Pharmacy Antibiotic Note  Tracy Todd is a 54 y.o. male admitted on 02/05/2020 with wound infection.  Pharmacy has been consulted for vancomycin dosing.  Plan: Vancomycin 1gm iv x1 then Vancomycin 500 mg IV Q 12 hrs. Goal AUC 400-550. Expected AUC: 503 SCr used: 0.8  Nomogram vancomycin 500mg  iv q12hr 40-50kg; 60-80 mL/min Vancomycin trough goal 15-20   Height: 5\' 4"  (162.6 cm) Weight: 45.7 kg (100 lb 12 oz) IBW/kg (Calculated) : 59.2  Temp (24hrs), Avg:98 F (36.7 C), Min:97.7 F (36.5 C), Max:98.4 F (36.9 C)  Recent Labs  Lab 02/05/20 1140 02/05/20 1335 02/06/20 0308  WBC 5.9  --  5.0  CREATININE 0.90  --  0.80  LATICACIDVEN 1.2 1.6  --     Estimated Creatinine Clearance: 67.4 mL/min (by C-G formula based on SCr of 0.8 mg/dL).    Allergies  Allergen Reactions  . Penicillins     unknown    Antimicrobials this admission: Vancomycin 02/05/2020 >>   Dose adjustments this admission: -  Microbiology results: -  Thank you for allowing pharmacy to be a part of this patient's care.  02/08/20 [place] 02/06/2020 4:32 AM

## 2020-02-06 NOTE — Evaluation (Signed)
Occupational Therapy Evaluation Patient Details Name: Tracy Todd MRN: [id number] DOB: [birth year] Today's Date: 02/06/2020    History of Present Illness 54 y.o. male with medical history significant of Down syndrome, seizures and OA bil knees presenting from SNF with sacral and L hip decubitus and inability to ambulate - per sister, pt was ambulatory when admitted to SNF in April, 21.     Clinical Impression   Mr. Mithran Strike is a 54 year old man who presents with cognitive deficits from Down Syndrome, generalized weakness, decreased activity tolerance, complaints of pain, decreased ROM in bilateral knees resulting in decreased ability to perform functional mobility and ADLs. Patient resistive to transfers - unknown if from pain or fear or combination of both - and required max x 2 to transfer to side of bed. At side of bed patient able to perform grooming tasks. Patient transferred to recliner with stand pivot with minimal weight bearing through toes. Once again patient fearful and resistive with transfers. Patient will benefit from skilled OT services to improve deficits and improve functional abilities in order to reduce caregiver burden.     Follow Up Recommendations   (Family reports patient going to alternative family living)    Equipment Recommendations    TBD - depends on what DC faciity has.   Recommendations for Other Services       Precautions / Restrictions Precautions Precautions: Fall Restrictions Weight Bearing Restrictions: No      Mobility Bed Mobility Overal bed mobility: Needs Assistance Bed Mobility: Sit to Supine     Supine to sit: Max assist;+2 for physical assistance;+2 for safety/equipment Sit to supine: Mod assist   General bed mobility comments: pt rolled into bed and pulled LEs up and into side ly position  Transfers Overall transfer level: Needs assistance Equipment used: None Transfers: Sit to/from [employer] Transfers Sit to Stand: Max  assist;+2 physical assistance;+2 safety/equipment;From elevated surface Stand pivot transfers: Max assist;+2 physical assistance;+2 safety/equipment;From elevated surface       General transfer comment: Pt min WB on LEs - stand pvt bed to chair.    Balance Overall balance assessment: Needs assistance Sitting-balance support: No upper extremity supported;Feet supported Sitting balance-Leahy Scale: Fair Sitting balance - Comments: Initially leaning back (mostly resistive) but improved with time Postural control: Posterior lean Standing balance support: Bilateral upper extremity supported Standing balance-Leahy Scale: Zero                             ADL either performed or assessed with clinical judgement   ADL Overall ADL's : Needs assistance/impaired Eating/Feeding: Set up;Sitting;Bed level Eating/Feeding Details (indicate cue type and reason): Patient demonstrated physicl abilities to feed himself - however sister seen feeding him. Grooming: Wash/dry face;Oral care;Set up;Sitting   Upper Body Bathing: Moderate assistance;Set up;Sitting   Lower Body Bathing: Maximal assistance;Bed level   Upper Body Dressing : Set up;Sitting   Lower Body Dressing: Total assistance;Bed level   Toilet Transfer: +2 for safety/equipment;+2 for physical assistance;BSC;Stand-pivot   Toileting- Clothing Manipulation and Hygiene: Total assistance;+2 for physical assistance;+2 for safety/equipment;Sit to/from Nurse, children's Details (indicate cue type and reason): n/a Functional mobility during ADLs: +2 for physical assistance;+2 for safety/equipment;Maximal assistance       Vision   Vision Assessment?: No apparent visual deficits     Perception     Praxis      Pertinent Vitals/Pain Pain Assessment: Faces Faces Pain Scale: Hurts  a little bit Pain Location: patinet unable to report Pain Descriptors / Indicators: Grimacing;Guarding Pain Intervention(s): Monitored  during session;Limited activity within patient's tolerance     Hand Dominance Right   Extremity/Trunk Assessment Upper Extremity Assessment Upper Extremity Assessment: Overall WFL for tasks assessed   Lower Extremity Assessment Lower Extremity Assessment: Defer to PT evaluation RLE Deficits / Details: contractures appear to be developing bil knees (R appears worse than L) but pt not following cues to allow full assessment - ? contracture vs pt resisting movement   Cervical / Trunk Assessment Cervical / Trunk Assessment: Normal   Communication Communication Communication: Receptive difficulties;Expressive difficulties   Cognition Arousal/Alertness: Awake/alert Behavior During Therapy: Impulsive (resistive at times.) Overall Cognitive Status: History of cognitive impairments - at baseline                                 General Comments: Down Syndrome, mostly non verbal   General Comments       Exercises     Shoulder Instructions      Home Living Family/patient expects to be discharged to:: Other (Comment)                                 Additional Comments: This alternative living facility has the ability to handle patient's physical deficits.      Prior Functioning/Environment Level of Independence: Needs assistance  Gait / Transfers Assistance Needed: Pt was ambulatory without AD prior to admit to SNF 4/21 but largely bedbound and only up to chair with assist of 2 at this time ADL's / Homemaking Assistance Needed: suspect required assist for ADL   Comments: very limited verbalization but will respond to some questions with single word answers and nodding/shaking head        OT Problem List: Decreased strength;Impaired balance (sitting and/or standing);Decreased safety awareness;Decreased cognition;Decreased activity tolerance;Decreased knowledge of precautions;Decreased knowledge of use of DME or AE;Pain      OT  Treatment/Interventions: Self-care/ADL training;Therapeutic exercise;DME and/or AE instruction;Patient/family education;Balance training;Therapeutic activities    OT Goals(Current goals can be found in the care plan section) Acute Rehab OT Goals Patient Stated Goal: improve mobility and participation OT Goal Formulation: With family Time For Goal Achievement: 02/20/20 Potential to Achieve Goals: Fair  OT Frequency: Min 2X/week   Barriers to D/C:            Co-evaluation PT/OT/SLP Co-Evaluation/Treatment: Yes Reason for Co-Treatment: Complexity of the patient's impairments (multi-system involvement);Necessary to address cognition/behavior during functional activity;For patient/therapist safety;To address functional/ADL transfers PT goals addressed during session: Mobility/safety with mobility OT goals addressed during session: ADL's and self-care;Other (comment) (transfers)      AM-PAC OT "6 Clicks" Daily Activity     Outcome Measure Help from another person eating meals?: A Little Help from another person taking care of personal grooming?: A Little Help from another person toileting, which includes using toliet, bedpan, or urinal?: Total Help from another person bathing (including washing, rinsing, drying)?: A Lot Help from another person to put on and taking off regular upper body clothing?: A Little Help from another person to put on and taking off regular lower body clothing?: Total 6 Click Score: 13   End of Session Nurse Communication: Mobility status  Activity Tolerance: Treatment limited secondary to agitation Patient left: in chair;with call bell/phone within reach;with chair alarm set;with family/visitor present  OT  Visit Diagnosis: Other abnormalities of gait and mobility (R26.89);Muscle weakness (generalized) (M62.81);Pain;Unsteadiness on feet (R26.81)                Time: 7218-2883 OT Time Calculation (min): 24 min Charges:  OT General Charges $OT Visit: 1  Visit OT Evaluation $OT Eval Moderate Complexity: 1 Mod  Dorlis Judice, OTR/L [place]  Office [phone] Pager: [phone]   Lenward Chancellor 02/06/2020, 5:21 PM

## 2020-02-06 NOTE — Evaluation (Addendum)
Physical Therapy Evaluation Patient Details Name: Tracy Todd MRN: [id number] DOB: 1965/12/25 Today's Date: 02/06/2020   History of Present Illness  54 y.o. male with medical history significant of Down syndrome, seizures and OA bil knees presenting from SNF with sacral and L hip decubitus and inability to ambulate - per sister, pt was ambulatory when admitted to SNF in April, 21.    Clinical Impression  Pt admitted as above and presenting with functional mobility limitations 2* generalized weakness, balance deficits, and cognitive deficits related to Down's.  Pt would benefit from acute stay PT and follow up PT at "alternative family living" to further address deficits and to maximize IND and safety.    Follow Up Recommendations Other (comment) (PT at 'Alternative family living" )    Equipment Recommendations  None recommended by PT    Recommendations for Other Services       Precautions / Restrictions Precautions Precautions: Fall Restrictions Weight Bearing Restrictions: No      Mobility  Bed Mobility Overal bed mobility: Needs Assistance Bed Mobility: Supine to Sit     Supine to sit: Max assist;+2 for physical assistance;+2 for safety/equipment     General bed mobility comments: Pt resistant to OOB - pad utilized to rotate pt to EOB sitting to avoid shearing on wound areas..  Pt initially resistant to transferring to sitting EOB but with increased time balanced with sup/min guard to wash face and rush teeth  Transfers Overall transfer level: Needs assistance Equipment used: None Transfers: Sit to/from [employer] Sit to Stand: Max assist;+2 physical assistance;+2 safety/equipment;From elevated surface Stand pivot transfers: Max assist;+2 physical assistance;+2 safety/equipment;From elevated surface       General transfer comment: Pt resistant to standing and tranferring to chair.  Sister present and encouraging.  Assist to bring wt up and fwd and to  rotate to chair.  Pt min WB on LEs.  Ambulation/Gait                Stairs            Wheelchair Mobility    Modified Rankin (Stroke Patients Only)       Balance Overall balance assessment: Needs assistance Sitting-balance support: No upper extremity supported;Feet supported Sitting balance-Leahy Scale: Fair     Standing balance support: Bilateral upper extremity supported Standing balance-Leahy Scale: Zero                               Pertinent Vitals/Pain Pain Assessment:  (pt grimacing but ?pain vs unwilliness to stand.)    Home Living Family/patient expects to be discharged to:: Other (Comment) ("alternative family living")                      Prior Function Level of Independence: Needs assistance   Gait / Transfers Assistance Needed: Pt was ambulatory without AD prior to admit to [facility] 4/21 but largely bedbound and only up to chair with assist of 2 at this time  ADL's / Homemaking Assistance Needed: suspect required assist for ADL  Comments: very limited verbalization but will respond to some questions with single word answers and nodding/shaking head     Hand Dominance        Extremity/Trunk Assessment   Upper Extremity Assessment Upper Extremity Assessment: Defer to OT evaluation    Lower Extremity Assessment Lower Extremity Assessment: RLE deficits/detail;LLE deficits/detail;Generalized weakness RLE Deficits / Details: contractures appear to be developing  bil knees (R appears worse than L) and heel cords -  pt not following cues to allow full assessment - ? contracture vs pt resisting movement       Communication   Communication: Receptive difficulties;Expressive difficulties  Cognition Arousal/Alertness: Awake/alert Behavior During Therapy: Impulsive Overall Cognitive Status: History of cognitive impairments - at baseline                                        General Comments      Exercises      Assessment/Plan    PT Assessment Patient needs continued PT services  PT Problem List Decreased strength;Decreased range of motion;Decreased activity tolerance;Decreased balance;Decreased mobility;Decreased cognition;Decreased knowledge of use of DME;Pain;Decreased safety awareness;Decreased knowledge of precautions       PT Treatment Interventions DME instruction;Gait training;Functional mobility training;Therapeutic activities;Therapeutic exercise;Patient/family education;Cognitive remediation    PT Goals (Current goals can be found in the Care Plan section)  Acute Rehab PT Goals Patient Stated Goal: Pt with no goals expressed PT Goal Formulation: Patient unable to participate in goal setting Time For Goal Achievement: 02/20/20 Potential to Achieve Goals: Fair    Frequency Min 3X/week   Barriers to discharge        Co-evaluation               AM-PAC PT "6 Clicks" Mobility  Outcome Measure Help needed turning from your back to your side while in a flat bed without using bedrails?: None Help needed moving from lying on your back to sitting on the side of a flat bed without using bedrails?: A Lot Help needed moving to and from a bed to a chair (including a wheelchair)?: A Little Help needed standing up from a chair using your arms (e.g., wheelchair or bedside chair)?: A Lot Help needed to walk in hospital room?: Total Help needed climbing 3-5 steps with a railing? : Total 6 Click Score: 13    End of Session Equipment Utilized During Treatment: Gait belt Activity Tolerance: Patient tolerated treatment well Patient left: in chair;with call bell/phone within reach;with family/visitor present Nurse Communication: Mobility status;Need for lift equipment;Other (comment) (pressure relief cushion in place in chair) PT Visit Diagnosis: Difficulty in walking, not elsewhere classified (R26.2);Muscle weakness (generalized) (M62.81)    Time: 1445-1510 PT Time Calculation (min)  (ACUTE ONLY): 25 min   Charges:   PT Evaluation $PT Eval Moderate Complexity: [place] Pager [phone] Office [phone]   Jarian Longoria 02/06/2020, 4:27 PM

## 2020-02-06 NOTE — Progress Notes (Signed)
Transition of Care (TOC) -30 day Note       Patient Details  Name: Tracy Todd YQI:[id number] Date of Birth:07/04/1966   Transition of Care [facility]) CM/SW Contact  Name: [name] Phone Number: [phone] Date:02-06-2020 Time: 3:50pm   MUST ID:    To Whom it May Concern:   Please be advised that the above patient will require a short-term nursing home stay, anticipated 30 days or less rehabilitation and strengthening. The plan is for return home.

## 2020-02-06 NOTE — Progress Notes (Addendum)
Initial Nutrition Assessment  DOCUMENTATION CODES:   Underweight  INTERVENTION:  - will order [employer]ae [employer] BID, each supplement provides 455 kcal and 20 grams protein. - will order 30 mL Prostat BID, each supplement provides 100 kcal and 15 grams of protein. - will order 1 tablet multivitamin with minerals/day.  - will order 220 mg zinc sulfate once/day and 500 mg ascorbic acid once/day to promote wound healing.  - will order 1 packet Juven BID, each packet provides 95 calories, 2.5 grams of protein (collagen), and 9.8 grams of carbohydrate (3 grams sugar); also contains 7 grams of L-arginine and L-glutamine, 300 mg vitamin C, 15 mg vitamin E, 1.2 mcg vitamin B-12, 9.5 mg zinc, 200 mg calcium, and 1.5 g  Calcium Beta-hydroxy-Beta-methylbutyrate to support wound healing.  - staff to cut foods/set up meal trays and encourage patient to self-feed, per sister's request.   - could consider checking iron/anemia panel for patient who was on long-term zinc supplementation PTA.    NUTRITION DIAGNOSIS:   Increased nutrient needs related to acute illness, wound healing as evidenced by estimated needs.  GOAL:   Patient will meet greater than or equal to 90% of their needs  MONITOR:   PO intake, Supplement acceptance, Labs, Weight trends, Skin  REASON FOR ASSESSMENT:   Malnutrition Screening Tool, Consult Assessment of nutrition requirement/status, Wound healing  ASSESSMENT:   54 year old male with medical history of Down syndrome--nonverbal, seizures, arthritis, and ambulatory dysfunction. Patient was brought to the hospital by his sister, with concerns of infected pressure wounds on his hip and back. Patient was hospitalized at [facility] in April and then discharged to [facility] for rehab.  Per chart review, patient consumed 75% of breakfast today. Patient noted to be mainly non-verbal at baseline.   Patient laying in bed at this time and his sister, who was at bedside, provided  all information. Patient was living in a group home x30 years and had syncope x3 following vaccination which led to hospitalization and he was discharged to [facility] for rehab following hospitalization. Plan after d/c from [facility] is for ALF with in-home OT/PT services.   Patient has no chewing or swallowing difficulties but does need items to be in bite-sized pieces so that he does not take too big of a bite as moving food around in his mouth can be difficult. Patient was previously able to self feed but was not allowed to do so at SNF; sister would like him to be encouraged to self feed to maintain a sense of autonomy. He does need items cut up and opened for him.   Patient does not like vegetables, but greatly enjoys fruit and does very well with eating meats and fish. A focus has been placed on protein intake. He was also receiving Ensure and Prostat supplements once or twice/day at SNF. Was also receiving zinc and multivitamin, no vitamin C/ascorbic acid.   Prior to admission to SNF, patient was ambulatory. Due to development and worsening of pressure injuries, he is no longer ambulatory, but sister is hopeful that wound healing will occur so that he can become ambulatory in the future.  Per chart review, weight yesterday was 101 lb and weight on 12/06/19 was 123 lb. This indicates 22 lb weight loss (18% body weight) in the past 2 months; significant for time frame. Suspect some degree of malnutrition but unable to confirm without completing NFPE.    Labs reviewed; Ca: 8.7 mg/dl. Medications reviewed; 500 mcg oral cyanocobalamin/day.  NUTRITION - FOCUSED PHYSICAL EXAM:  patient refused; will attempt at follow-up.   Diet Order:   Diet Order            Diet regular Room service appropriate? Yes; Fluid consistency: Thin  Diet effective now                 EDUCATION NEEDS:   Education needs have been addressed  Skin:  Skin Assessment: Skin Integrity Issues: Skin  Integrity Issues:: Stage III, Stage IV Stage III: sacrum (4cm x 7cm x 0.4cm) Stage IV: L throcanter (3cm x 5cm x 0cm)  Last BM:  6/17  Height:   Ht Readings from Last 1 Encounters:  02/06/20 5\' 4"  (1.626 m)    Weight:   Wt Readings from Last 1 Encounters:  02/05/20 45.7 kg     Estimated Nutritional Needs:  Kcal:  1750-1950 kcal Protein:  95-110 grams Fluid:  >/= 2 L/day     Jarome Matin, MS, RD, LDN, CNSC Inpatient Clinical Dietitian RD pager # available in [place]  After hours/weekend pager # available in [facility]

## 2020-02-06 NOTE — NC FL2 (Signed)
[place] LEVEL OF CARE SCREENING TOOL     IDENTIFICATION  Patient Name: Tracy Todd Birthdate: 1965/09/23 Sex: male Admission Date (Current Location): 02/05/2020  [place] and [place] Number:  Kathleen Argue [id number] [place] and Address:  [facility],  [place], [place]      Provider Number: [id number]  Attending Physician Name and Address:  Tawni Millers,*  Relative Name and Phone Number:  Bergen Magner,  [phone]    Current Level of Care: Hospital Recommended Level of Care: [place] Prior Approval Number:    Date Approved/Denied:   PASRR Number:    Discharge Plan: SNF    Current Diagnoses: Patient Active Problem List   Diagnosis Date Noted  . Pressure injury of skin 02/06/2020  . Wound infection 02/05/2020  . Iron deficiency anemia 09/17/2019  . Elevated troponin 09/17/2019  . Syncope 09/16/2019  . Down syndrome 12/27/2012  . Arthritis 10/25/2011  . Hyperlipidemia 10/25/2011    Orientation RESPIRATION BLADDER Height & Weight     Self (Patient is developmentally delayed)  Normal Incontinent Weight: 100 lb 12 oz (45.7 kg) Height:  5\' 4"  (162.6 cm)  BEHAVIORAL SYMPTOMS/MOOD NEUROLOGICAL BOWEL NUTRITION STATUS      Incontinent Diet (Regular Diet)  AMBULATORY STATUS COMMUNICATION OF NEEDS Skin     Non-Verbally PU Stage and Appropriate Care     PU Stage 3 Dressing: QID (Full thickness tissue loss.  Subcutaneous fat may be visible but bone, tendon or muscle are not exposed.) PU Stage 4 Dressing: Daily (Full thickness tissue loss with exposed bone, tendon or muscle.  Wound extends from sacrum to right buttock, pink bleeding wound bed with what looks like muscle visible.)               Personal Care Assistance Level of Assistance  Bathing, Feeding, Dressing Bathing Assistance: Maximum assistance Feeding assistance: Maximum assistance Dressing Assistance: Maximum assistance     Functional  Limitations Info  Sight, Hearing, Speech Sight Info: Adequate Hearing Info: Adequate Speech Info: Impaired (Nonverbal)    SPECIAL CARE FACTORS FREQUENCY  OT (By licensed OT), PT (By licensed PT)     PT Frequency: 5 times a week. OT Frequency: 5 times a week.            Contractures Contractures Info: Not present    Additional Factors Info  Code Status, Allergies, Psychotropic Code Status Info: Full Code Allergies Info: Penicillins Psychotropic Info: Lexapro         Current Medications (02/06/2020):  This is the current hospital active medication list Current Facility-Administered Medications  Medication Dose Route Frequency Provider Last Rate Last Admin  . acetaminophen (TYLENOL) tablet 650 mg  650 mg Oral Q6H PRN Nicole Kindred A, DO       Or  . acetaminophen (TYLENOL) suppository 650 mg  650 mg Rectal Q6H PRN Nicole Kindred A, DO      . aspirin EC tablet 81 mg  81 mg Oral Daily Nicole Kindred A, DO   81 mg at 02/06/20 0931  . bisacodyl (DULCOLAX) EC tablet 5 mg  5 mg Oral Daily PRN Nicole Kindred A, DO      . clopidogrel (PLAVIX) tablet 75 mg  75 mg Oral Daily Nicole Kindred A, DO   75 mg at 02/06/20 0927  . collagenase (SANTYL) ointment   Topical Daily Arrien, Jimmy Picket, MD      . diclofenac Sodium (VOLTAREN) 1 % topical gel 2 g  2 g Topical  TID Esaw Grandchild A, DO   2 g at 02/06/20 0933  . enoxaparin (LOVENOX) injection 40 mg  40 mg Subcutaneous Q24H Esaw Grandchild A, DO   40 mg at 02/06/20 0006  . escitalopram (LEXAPRO) tablet 5 mg  5 mg Oral Daily Esaw Grandchild A, DO   5 mg at 02/06/20 4098  . iron polysaccharides (NIFEREX) capsule 150 mg  150 mg Oral Daily Esaw Grandchild A, DO   150 mg at 02/06/20 0932  . lactobacillus (FLORANEX/LACTINEX) granules 1 g  1 g Oral TID WC Esaw Grandchild A, DO   1 g at 02/06/20 1234  . loratadine (CLARITIN) tablet 10 mg  10 mg Oral Daily Esaw Grandchild A, DO   10 mg at 02/06/20 0932  . ondansetron (ZOFRAN) tablet 4 mg   4 mg Oral Q6H PRN Esaw Grandchild A, DO       Or  . ondansetron (ZOFRAN) injection 4 mg  4 mg Intravenous Q6H PRN Esaw Grandchild A, DO      . polyethylene glycol (MIRALAX / GLYCOLAX) packet 17 g  17 g Oral Daily PRN Esaw Grandchild A, DO      . pravastatin (PRAVACHOL) tablet 40 mg  40 mg Oral q1800 Esaw Grandchild A, DO      . traMADol Janean Sark) tablet 50 mg  50 mg Oral Q12H PRN Esaw Grandchild A, DO   50 mg at 02/06/20 0005  . vancomycin (VANCOREADY) IVPB 500 mg/100 mL  500 mg Intravenous Q12H Esaw Grandchild A, DO 100 mL/hr at 02/06/20 0751 500 mg at 02/06/20 0751  . vitamin B-12 (CYANOCOBALAMIN) tablet 500 mcg  500 mcg Oral Daily Pennie Banter, DO         Discharge Medications: Please see discharge summary for a list of discharge medications.  Relevant Imaging Results:  Relevant Lab Results:   Additional Information    Arzu Mcgaughey C Tarpley-Carter, LCSWA

## 2020-02-06 NOTE — Consult Note (Signed)
[facility] Surgery Consult Note  Tracy Todd 09-Dec-1965  [id number].    Requesting MD: Erin Hearing Chief Complaint: buttocks decubitus Reason for Consult: same  HPI:  Patient is a 54 year old male with a history of Down syndrome.  He is nonverbal he has a history of arthritis and possible seizures.  He was in a group home prior to his admission on 4/9-4/16/21 for a syncopal episode.  This was his third syncopal episode.  His family was concerned that this was more due to the Covid vaccine the another neurologic source.  He was evaluated by both neurology and vascular surgery and was cleared from their standpoint and discharged to [facility] care.  Notes show he was ambulatory before this April admission.  Patient's family reported yesterday that he still not ambulating at the SNF.  He also reported he is not having physical therapy at this point either.  He subsequently developed a pressure ulcer on his back and left hip.  He had some debridement was done I believe at the nursing facility.  He was brought to [facility] for further evaluation and treatment.   Work-up in the ED shows he is afebrile, vital signs are stable, labs are essentially normal except for a glucose of 102, calcium of 8.7, and albumin of 3.0.  Lactate is 1.2 and 1.6 respectively.  WBC 5.9 hemoglobin 11.5, hematocrit 39.2, platelets are 354,000.  Covid is negative.  Plain films show irregular lucency or lytic area seen involving the posterior portion of the iliac bone on side 1 although that cannot be determined exactly since its a lateral projection.  CT scan shows ulceration medial wall of the right buttock extending to the medial margin of the gluteus maximus.  Although the region of the ulceration is close to the lower sacral segments in the coccyx they did not see any destructive bone findings in the vicinity typical for osteomyelitis.  There is an subacute superior endplate compression fracture at L2 with about 20% loss  of his vertebral body height and a small fracture of the anterior corner.  No significant bony retropulsion currently.  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  Left trochanter was 3 x 5 x 0 cm.  Wound bed of the sacrum was 90% pink with 10% fibrous yellow tissue.  Left trochanter site was 100% nonviable.  We are asked to see.   ROS: Review of Systems  Unable to perform ROS: Mental acuity    History reviewed. No pertinent family history.  Past Medical History:  Diagnosis Date  . Arthritis   . Down's syndrome   . Seizures (HCC)     History reviewed. No pertinent surgical history.  Social History:  reports that he has never smoked. He has never used smokeless tobacco. He reports that he does not drink alcohol and does not use drugs.  Allergies:  Allergies  Allergen Reactions  . Penicillins     unknown    Medications Prior to Admission  Medication Sig Dispense Refill  . acetaminophen (TYLENOL) 500 MG tablet Take 500 mg by mouth every 12 (twelve) hours.    . Amino Acids-Protein Hydrolys (FEEDING SUPPLEMENT, PRO-STAT SUGAR FREE 64,) LIQD Take 30 mLs by mouth in the morning and at bedtime.    Marland Kitchen aspirin EC 81 MG EC  tablet Take 1 tablet (81 mg total) by mouth daily. 30 tablet 1  . CEFTRIAXONE SODIUM IJ Inject 1 g into the muscle every 12 (twelve) hours. Start 02/03/20 until 02/06/20    . cetirizine (ZYRTEC) 10 MG tablet Take 10 mg by mouth daily.    . clopidogrel (PLAVIX) 75 MG tablet Take 1 tablet (75 mg total) by mouth daily. 30 tablet 0  . diclofenac Sodium (VOLTAREN) 1 % GEL Apply 1 g topically 4 (four) times daily. To both knees    . Ensure Plus (ENSURE PLUS) LIQD Take 237 mLs by mouth 2 (two) times daily between meals.    Marland Kitchen escitalopram (LEXAPRO) 5 MG tablet Take 5 mg by mouth daily.      Marland Kitchen FIBER ADULT GUMMIES PO Take 1 tablet by mouth in the morning and at bedtime.     . iron polysaccharides (NU-IRON) 150 MG capsule Take 1 capsule (150 mg total) by mouth daily. 30 capsule 0  . ketoconazole (NIZORAL) 2 % shampoo Apply 1 application topically See admin instructions. Use to wash hair and face, alternate by week.    Marland Kitchen LACTOBACILLUS PO Take 1 capsule by mouth in the morning, at noon, and at bedtime.    . Lidocaine HCl (XYLOCAINE IJ) Inject 1 mL into the muscle every 12 (twelve) hours. Until 6.27.21 started 6.14.21    . loperamide (IMODIUM A-D) 2 MG tablet Take 2 mg by mouth in the morning, at noon, and at bedtime.    . miconazole (MICATIN) 2 % cream Apply 1 application topically 2 (two) times daily. Apply to feet    . Multiple Vitamins-Minerals (MULTIVITAMIN WITH MINERALS) tablet Take 1 tablet by mouth daily.    Marland Kitchen nystatin cream (MYCOSTATIN) Apply 1 application topically 3 (three) times daily as needed for dry skin.    . pravastatin (PRAVACHOL) 40 MG tablet Take 40 mg by mouth daily.    Marland Kitchen selenium sulfide (SELSUN) 1 % LOTN Apply 1 application topically See admin instructions. Use to wash hair and face alternate by week.    Marland Kitchen Soap & Cleansers (CERAVE HYDRATING CLEANSER) LIQD Apply 1 application topically daily. Use with cool showers    . sodium hypochlorite (DAKIN'S 1/4 STRENGTH) 0.125 % SOLN Irrigate with 1 application as directed See admin instructions. Apply to right hip bid    . sucralfate (CARAFATE) 1 g tablet Take 1 g by mouth 2 (two) times daily.    . traMADol (ULTRAM) 50 MG tablet Take 50 mg by mouth every 12 (twelve) hours as needed for moderate pain.    . vitamin B-12 (CYANOCOBALAMIN) 500 MCG tablet Take 1 tablet (500 mcg total) by mouth daily. 30 tablet 0  . zinc gluconate 50 MG tablet Take 50 mg by mouth daily.    . diazepam (VALIUM) 5 MG tablet Take 1 tablet (5 mg total) by mouth See admin instructions. Take 1 tablet 30 minutes prior to procedure, not to exceed 2 tablets.  (Patient not taking: Reported on 02/05/2020) 1 tablet 0    Blood pressure 107/72, pulse 62, temperature 98.2 F (36.8 C), temperature source Oral, resp. rate 14, height 5\' 4"  (1.626 m), weight 45.7 kg, SpO2 100 %. Physical Exam:  General: pleasant, WM with Down's syndrome, anxious we are going to do something that will hurt HEENT: head is normocephalic, atraumatic.  Sclera are noninjected.  Pupils are equal.  Ears and nose without any masses or lesions.  Mouth is pink and moist Heart: regular, rate, and rhythm.  Normal s1,s2.  No obvious murmurs, gallops, or rubs noted.  Palpable radial and pedal pulses bilaterally Lungs: CTAB, no wheezes, rhonchi, or rales noted.  Respiratory effort nonlabored Abd: soft, NT, ND, +BS, no masses, hernias, or organomegaly ZO:XWRUS:Both legs are atrophic, his sister says he can straighten them out but it was very difficult and he almost looks like he has or is developing contractures. No cyanosis, clubbing, or edema. Skin: warm and dry with no masses, lesions, or rashes Neuro: Cranial nerves 2-12 grossly intact, sensation is normal throughout Psych: Alert, anxious, his sister is there and trying to keep him calm.    Left trochanter site 3cm x 5cm x 0cm He has some minimal cellulitis, but no palpable fluid collections around the site.      Sacrum 4cm x 7cm x 0.4cm  Skin break down as seen minimal cellulitis, most of the wound is clean and pink.    Results for orders placed or performed during the hospital encounter of 02/05/20 (from the past 48 hour(s))  Comprehensive metabolic panel     Status: Abnormal   Collection Time: 02/05/20 11:40 AM  Result Value Ref Range   Sodium 143 135 - 145 mmol/L   Potassium 4.2 3.5 - 5.1 mmol/L   Chloride 104 98 - 111 mmol/L   CO2 29 22 - 32 mmol/L   Glucose, Bld 102 (H) 70 - 99 mg/dL    Comment: Glucose reference range applies only to samples taken after fasting for at least 8 hours.   BUN 18 6 - 20 mg/dL   Creatinine, Ser 0.450.90  0.61 - 1.24 mg/dL   Calcium 8.7 (L) 8.9 - 10.3 mg/dL   Total Protein 6.6 6.5 - 8.1 g/dL   Albumin 3.0 (L) 3.5 - 5.0 g/dL   AST 30 15 - 41 U/L   ALT 26 0 - 44 U/L   Alkaline Phosphatase 63 38 - 126 U/L   Total Bilirubin 0.4 0.3 - 1.2 mg/dL   GFR calc non Af Amer >60 >60 mL/min   GFR calc Af Amer >60 >60 mL/min   Anion gap 10 5 - 15    Comment: Performed at [facility], [address] [place], [place] [address]  CBC with Differential     Status: Abnormal   Collection Time: 02/05/20 11:40 AM  Result Value Ref Range   WBC 5.9 4.0 - 10.5 K/uL   RBC 3.96 (L) 4.22 - 5.81 MIL/uL   Hemoglobin 11.5 (L) 13.0 - 17.0 g/dL   HCT 19.139.2 39 - 52 %   MCV 99.0 80.0 - 100.0 fL   MCH 29.0 26.0 - 34.0 pg   MCHC 29.3 (L) 30.0 - 36.0 g/dL   RDW 47.816.2 (H) 29.511.5 - 62.115.5 %   Platelets 354 150 - 400 K/uL   nRBC 0.0 0.0 - 0.2 %   Neutrophils Relative % 81 %   Neutro Abs 4.7 1.7 - 7.7 K/uL   Lymphocytes Relative 11 %   Lymphs Abs 0.7 0.7 - 4.0 K/uL   Monocytes Relative 7 %   Monocytes Absolute 0.4 0 - 1 K/uL   Eosinophils Relative 0 %   Eosinophils Absolute 0.0 0 - 0 K/uL   Basophils Relative 1 %   Basophils Absolute 0.1 0 - 0 K/uL   Immature Granulocytes 0 %   Abs Immature Granulocytes 0.02 0.00 - 0.07 K/uL    Comment: Performed at [facility], [address] [place], [place] [address]  Lactic acid, plasma  Status: None   Collection Time: 02/05/20 11:40 AM  Result Value Ref Range   Lactic Acid, Venous 1.2 0.5 - 1.9 mmol/L    Comment: Performed at [facility], [address] [place], [place] [address]  Sedimentation rate     Status: Abnormal   Collection Time: 02/05/20 12:39 PM  Result Value Ref Range   Sed Rate 50 (H) 0 - 16 mm/hr    Comment: Performed at [facility], [address], [place], [place] [address]  Lactic acid, plasma     Status: None   Collection Time: 02/05/20  1:35 PM  Result Value Ref Range    Lactic Acid, Venous 1.6 0.5 - 1.9 mmol/L    Comment: Performed at [facility], [address] [place], [place] [address]  SARS Coronavirus 2 by RT PCR (hospital order, performed in [facility] hospital lab) Nasopharyngeal Nasopharyngeal Swab     Status: None   Collection Time: 02/05/20  3:55 PM   Specimen: Nasopharyngeal Swab  Result Value Ref Range   SARS Coronavirus 2 NEGATIVE NEGATIVE    Comment: (NOTE) SARS-CoV-2 target nucleic acids are NOT DETECTED.  The SARS-CoV-2 RNA is generally detectable in upper and lower respiratory specimens during the acute phase of infection. The lowest concentration of SARS-CoV-2 viral copies this assay can detect is 250 copies / mL. A negative result does not preclude SARS-CoV-2 infection and should not be used as the sole basis for treatment or other patient management decisions.  A negative result may occur with improper specimen collection / handling, submission of specimen other than nasopharyngeal swab, presence of viral mutation(s) within the areas targeted by this assay, and inadequate number of viral copies (<250 copies / mL). A negative result must be combined with clinical observations, patient history, and epidemiological information.  Fact Sheet for Patients:   BoilerBrush.com.cy  Fact Sheet for Healthcare Providers: https://pope.com/  This test is not yet approved or  cleared by the [place] FDA and has been authorized for detection and/or diagnosis of SARS-CoV-2 by FDA under an Emergency Use Authorization (EUA).  This EUA will remain in effect (meaning this test can be used) for the duration of the COVID-19 declaration under Section 564(b)(1) of the Act, 21 U.S.C. section 360bbb-3(b)(1), unless the authorization is terminated or revoked sooner.  Performed at [facility], [address] [place], [place] [address]   Basic metabolic panel      Status: Abnormal   Collection Time: 02/06/20  3:08 AM  Result Value Ref Range   Sodium 140 135 - 145 mmol/L   Potassium 3.7 3.5 - 5.1 mmol/L   Chloride 101 98 - 111 mmol/L   CO2 27 22 - 32 mmol/L   Glucose, Bld 89 70 - 99 mg/dL    Comment: Glucose reference range applies only to samples taken after fasting for at least 8 hours.   BUN 15 6 - 20 mg/dL   Creatinine, Ser 0.25 0.61 - 1.24 mg/dL   Calcium 8.7 (L) 8.9 - 10.3 mg/dL   GFR calc non Af Amer >60 >60 mL/min   GFR calc Af Amer >60 >60 mL/min   Anion gap 12 5 - 15    Comment: Performed at [facility], [address], [place], [place] [address]  CBC     Status: Abnormal   Collection Time: 02/06/20  3:08 AM  Result Value Ref Range   WBC 5.0 4.0 - 10.5 K/uL  RBC 3.74 (L) 4.22 - 5.81 MIL/uL   Hemoglobin 11.1 (L) 13.0 - 17.0 g/dL   HCT 16.1 (L) 39 - 52 %   MCV 97.1 80.0 - 100.0 fL   MCH 29.7 26.0 - 34.0 pg   MCHC 30.6 30.0 - 36.0 g/dL   RDW 09.6 (H) 04.5 - 40.9 %   Platelets 346 150 - 400 K/uL   nRBC 0.0 0.0 - 0.2 %    Comment: Performed at [facility], [address], [place], [place] [address]   DG Sacrum/Coccyx  Result Date: 02/05/2020 CLINICAL DATA:  Open sacral decubitus wound. EXAM: SACRUM AND COCCYX - 2+ VIEW COMPARISON:  None. FINDINGS: Possible irregular lucency or lytic area is seen involving the posterior portion of the iliac bone on 1 of the sides, although it cannot be determined exactly since it is a lateral projection. Potentially this may represent osteomyelitis. IMPRESSION: Possible irregular lucency or lytic area seen involving the posterior portion of the iliac bone on 1 side, although it cannot be determined exactly which side since it is a lateral projection. CT scan is recommended for further evaluation. Electronically Signed   By: Lupita Raider M.D.   On: 02/05/2020 13:33   CT PELVIS WO CONTRAST  Result Date: 02/05/2020 CLINICAL DATA:  Pressure ulcer along the left  buttock, possible osteomyelitis EXAM: CT PELVIS WITHOUT CONTRAST TECHNIQUE: Multidetector CT imaging of the pelvis was performed following the standard protocol without intravenous contrast. COMPARISON:  Sacrum radiographs from 02/05/2020 FINDINGS: The patient was unable to comply with positioning and would only lay on his left side with the hips flexed. Images were obtained with a distorted imaging plane which could not be corrected due to cooperation issues. Urinary Tract:  Unremarkable Bowel: Air fluid levels in the distal colon suggesting diarrheal process. Vascular/Lymphatic: Unremarkable Reproductive:  Unremarkable Other:  Please see below regarding right upper buttock ulcer. Musculoskeletal: Ulceration medially along the right buttock extending into the medial margin of the gluteus maximus muscle, image 88/4. Surrounding cutaneous thickening and subcutaneous stranding. Although the region of ulceration is close to the lower sacral segment and coccyx, I do not observe characteristic bony destructive findings in this vicinity typical for osteomyelitis. There is a subacute superior endplate compression fracture at L4 with about 20% loss of vertebral body heights and a small fracture of the anterior superior corner as shown on image 46/8. 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 There is also some sclerosis and subsidence of the endplates at the L5-S1 level. The area of lucency of concern on prior radiography appears to correspond to the tip of the spinous process of L5, which does not demonstrate a significant lesion on CT. IMPRESSION: 1. Ulceration medially along the right buttock extending into the medial margin of the gluteus maximus muscle. Although the region of ulceration is close to the lower sacral segment and coccyx, I do not observe characteristic bony destructive findings in this vicinity typical  for osteomyelitis. 2. Subacute superior endplate compression fracture at L4 with about 20% loss of vertebral body heights and a small fracture of the anterior superior corner. No significant bony retropulsion at this time. 3. 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 4. Air fluid levels in the distal colon suggesting diarrheal process. Electronically Signed   By: Gaylyn Rong M.D.   On: 02/05/2020  15:35      Assessment/Plan Down's syndrome Bed bound since April Anemia  Dyslipidemia    Sacral and left trochanter decubitus   Plan:  From our standpoint I would just use Santyl ointment on the trochanter site at least twice a day.  Gently clean site with wet 4 x 4 each dressing change, pack wet to dry after applying new Santyl.  I would also add Santyl to the fibrous area seen on the sacral wound, and pack wet to dry BID also, you could change more frequently if soiled.  I discussed this with his sister who was in the room with him.  She is a consistent care giver for him.  I told her I was concerned contracture would lead to more ulcer and involve the right side too.  We talked about nutrition, reestablishing mobility if possible here and after discharge.   1.  Dressing changes with Santyl to both sites, wet to dry dressing BID and PRN. 2.  AIR mattress, work on mobility, antibiotics should be all he needs for now.  Please call if we      can be of further assistance.    Earnstine Regal [facility] Surgery 02/06/2020, 12:28 PM Please see Amion for pager number during day hours 7:00am-4:30pm

## 2020-02-06 NOTE — TOC Initial Note (Signed)
Transition of Care [facility]) - Initial/Assessment Note    Patient Details  Name: Tracy Todd MRN: [id number] Date of Birth: 31-Jul-1966  Transition of [facility]) CM/SW Contact:    Amia Rynders C Tarpley-Carter, [place] Phone Number: 02/06/2020, 11:03 AM  Clinical Narrative:                 CSW spoke with pts sister, Lattie Haw about her concerns with current SNF [facility]).  Pts sister has been looking for another SNF in the interim of patient being placed in [facility] starting February 19, 2020.  If patient is not here until he can be placed with [facility], TOC staff will assist pts sister with locating a SNF or Rehab to care for patient until then.   CSW will complete FL2.  Patient will need a level 2 manuel review PASSAR. Barriers to Discharge: Continued Medical Work up   Patient Goals and CMS Choice Patient states their goals for this hospitalization and ongoing recovery are:: Pts sister does not want patient to return to [employer].  He will start with [facility] on 02/19/2020.  Pts sister hopes he's here until then or she will work with [facility] staff to find a Rehab or SNF in the interim of [facility] placement. CMS Medicare.gov Compare Post Acute Care list provided to:: Other (Comment Required) (Patients sister, Brayan Votaw) Choice offered to / list presented to :  (Patients sister, Jaquin Coy)  Expected Discharge Plan and Services   In-house Referral: Clinical Social Work Discharge Planning Services: CM Consult   Living arrangements for the past 2 months: [place]                                      Prior Living Arrangements/Services Living arrangements for the past 2 months: [place] Lives with:: Self Patient language and need for interpreter reviewed:: No (There is not a language barrier, but patient is nonverbal (Down's Syndrome)) Do you feel safe going back to the place where you live?: No   Patients sister Lattie Haw) does not want him to return to [employer].  Need for Family Participation in Patient Care: Yes (Comment) Care giver support system in place?: Yes (comment)   Criminal Activity/Legal Involvement Pertinent to Current Situation/Hospitalization: No - Comment as needed  Activities of Daily Living Home Assistive Devices/Equipment: Wheelchair ADL Screening (condition at time of admission) Patient's cognitive ability adequate to safely complete daily activities?: No Is the patient deaf or have difficulty hearing?: No Does the patient have difficulty seeing, even when wearing glasses/contacts?: No Does the patient have difficulty concentrating, remembering, or making decisions?: Yes Patient able to express need for assistance with ADLs?: No Does the patient have difficulty dressing or bathing?: Yes Independently performs ADLs?: No Communication: Independent Dressing (OT): Needs assistance Is this a change from baseline?: Pre-admission baseline Grooming: Needs assistance Is this a change from baseline?: Pre-admission baseline Feeding: Needs assistance (may need encouragement or help cutting up food) Is this a change from baseline?: Pre-admission baseline Bathing: Needs assistance Is this a change from baseline?: Pre-admission baseline Toileting: Needs assistance Is this a change from baseline?: Pre-admission baseline In/Out Bed: Needs assistance Is this a change from baseline?: Pre-admission baseline Walks in Home: Needs assistance Is this a change from baseline?: Pre-admission baseline Does the patient have difficulty walking or climbing stairs?: Yes Weakness of Legs: Both Weakness of Arms/Hands: Both  Permission Sought/Granted Permission sought  to share information with : Case Manager, Magazine features editor Permission granted to share information with : Yes, Verbal Permission Granted  Share Information with NAME: Takeru Bose     Permission granted to share info w Relationship: Patients sister  Permission  granted to share info w Contact Information: [phone]  Emotional Assessment Appearance:: Appears stated age, Well-Groomed Attitude/Demeanor/Rapport: Engaged, Gracious Affect (typically observed): Accepting, Happy Orientation: :  (Patient is experiencing Down's Syndrome) Alcohol / Substance Use: Not Applicable Psych Involvement: No (comment)  Admission diagnosis:  Wound infection [T14.8XXA, L08.9] Patient Active Problem List   Diagnosis Date Noted  . Pressure injury of skin 02/06/2020  . Wound infection 02/05/2020  . Iron deficiency anemia 09/17/2019  . Elevated troponin 09/17/2019  . Syncope 09/16/2019  . Down syndrome 12/27/2012  . Arthritis 10/25/2011  . Hyperlipidemia 10/25/2011   PCP:  Eartha Inch, MD Pharmacy:   [facility] - [place], [place] - [phone] [address] [place] [address] Phone: [phone] Fax: [phone]     Social Determinants of Health (SDOH) Interventions    Readmission Risk Interventions No flowsheet data found.

## 2020-02-06 NOTE — Consult Note (Signed)
I have placed a request via Secure Chat to Dr. Griffith requesting photos of the wound areas of concern to be placed in the EMR.    [place] MSN,RN,CWOCN, CNS, CWON-AP [phone]  

## 2020-02-06 NOTE — Consult Note (Signed)
WOC Nurse Consult Note: Reason for Consult: pressure injuries Patient from SNF; Down's syndrome.  Patient has become more sedentary in the last few months. Non ambulatory now.   Wound type: 1. Sacrum: Stage 3 Pressure Injury 2. Left trochanter: Unstageable Pressure Injury  Pressure Injury POA: Yes  Measurement: 1. Sacrum: 4cm x 7cm x 0.4cm 2. Left trochanter: 3cm x 5cm x 0cm :  Wound bed: 1. Sacrum: 90% pink/10% fibrinous yellow 2. Left trochanter: 100% non viable tissue Drainage (amount, consistency, odor) minimal from each site, not able to express drainage from the hip wound with palpation Periwound:intact, no localized erythema  Dressing procedure/placement/frequency: 1. Sacrum: clean with saline/top with hydrogel/ change daily 2. Left trochanter: enzymatic debridement ointment, saline moist gauze, ABD pad, secure with tape. Change daily    Add air mattress for pressure redistribution and moisture management  Add RD consult to consider supplementation with liquid supplements; vitamins and minerals (sister believes patient is on MVI, Vit C, and Zinc) as I mention them. Chair pressure redistribution pad ordered for use while up in [facility] or chair. Explained rationale  Spent time with patient's sister explaining trajectory of a pressure injury; pressure from the bone up; need for offloading; use of enzymatic debridement, hydrotherapy, surgical consultation possible.   Discussed patient POC with Dr. Ella Jubilee  Discussed POC with patient and bedside nurse.  Re consult if needed, will not follow at this time. Thanks  Vincenzo Stave [employer], RN,CWOCN, CNS, CWON-AP [phone])

## 2020-02-06 NOTE — Progress Notes (Signed)
Physical Therapy Treatment Patient Details Name: Tracy Todd MRN: [id number] DOB: 07-11-1966 Today's Date: 02/06/2020    History of Present Illness 54 y.o. male with medical history significant of Down syndrome, seizures and OA bil knees presenting from SNF with sacral and L hip decubitus and inability to ambulate - per sister, pt was ambulatory when admitted to SNF in April, 21.      PT Comments    Pt less resistant to moving but min WB on LEs and requiring max assist to stand/pvt chair to bed.  Pt noted to have increased initiative to move sit to sidely and marked decrease in assist of this movement vs all other mobility tasks. Pt positioned in R side ly with pillow support.  Follow Up Recommendations  Other (comment)     Equipment Recommendations  None recommended by PT    Recommendations for Other Services       Precautions / Restrictions Precautions Precautions: Fall Restrictions Weight Bearing Restrictions: No    Mobility  Bed Mobility Overal bed mobility: Needs Assistance Bed Mobility: Sit to Supine     Supine to sit: Max assist;+2 for physical assistance;+2 for safety/equipment Sit to supine: Mod assist   General bed mobility comments: pt rolled into bed and pulled LEs up and into side ly position  Transfers Overall transfer level: Needs assistance Equipment used: None Transfers: Sit to/from [employer] Transfers Sit to Stand: Max assist;+2 physical assistance;+2 safety/equipment;From elevated surface Stand pivot transfers: Max assist;+2 physical assistance;+2 safety/equipment;From elevated surface       General transfer comment: Pt min WB on LEs - stand pvt chair to bed  Ambulation/Gait                 Stairs             Wheelchair Mobility    Modified Rankin (Stroke Patients Only)       Balance Overall balance assessment: Needs assistance Sitting-balance support: No upper extremity supported;Feet supported Sitting  balance-Leahy Scale: Fair     Standing balance support: Bilateral upper extremity supported Standing balance-Leahy Scale: Zero                              Cognition Arousal/Alertness: Awake/alert Behavior During Therapy: Impulsive Overall Cognitive Status: History of cognitive impairments - at baseline                                        Exercises      General Comments        Pertinent Vitals/Pain Pain Assessment:  (pt grimacing but ?pain vs unwilliness to stand.)    Home Living Family/patient expects to be discharged to:: Other (Comment) ("alternative family living")                    Prior Function Level of [place]: Needs assistance  Gait / Transfers Assistance Needed: Pt was ambulatory without AD prior to admit to [facility] 4/21 but largely bedbound and only up to chair with assist of 2 at this time ADL's / Homemaking Assistance Needed: suspect required assist for ADL Comments: very limited verbalization but will respond to some questions with single word answers and nodding/shaking head   PT Goals (current goals can now be found in the care plan section) Acute Rehab PT Goals Patient Stated Goal: Pt with no goals expressed  PT Goal Formulation: Patient unable to participate in goal setting Time For Goal Achievement: 02/20/20 Potential to Achieve Goals: Fair Progress towards PT goals: Progressing toward goals    Frequency    Min 3X/week      PT Plan Current plan remains appropriate    Co-evaluation              AM-PAC PT "6 Clicks" Mobility   Outcome Measure  Help needed turning from your back to your side while in a flat bed without using bedrails?: None Help needed moving from lying on your back to sitting on the side of a flat bed without using bedrails?: A Lot Help needed moving to and from a bed to a chair (including a wheelchair)?: A Little Help needed standing up from a chair using your arms (e.g.,  wheelchair or bedside chair)?: A Lot Help needed to walk in hospital room?: Total Help needed climbing 3-5 steps with a railing? : Total 6 Click Score: 13    End of Session Equipment Utilized During Treatment: Gait belt Activity Tolerance: Patient tolerated treatment well Patient left: in bed;with call bell/phone within reach;with nursing/sitter in room Nurse Communication: Mobility status;Need for lift equipment;Other (comment) PT Visit Diagnosis: Difficulty in walking, not elsewhere classified (R26.2);Muscle weakness (generalized) (M62.81)     Time: 7482-7078 PT Time Calculation (min) (ACUTE ONLY): 10 min  Charges:  $Therapeutic Activity: 8-22 mins                     Mauro Kaufmann PT Acute Rehabilitation Services Pager [phone] Office [phone]    Tracy Todd 02/06/2020, 4:55 PM

## 2020-02-07 LAB — CBC WITH DIFFERENTIAL/PLATELET
Abs Immature Granulocytes: 0.01 10*3/uL (ref 0.00–0.07)
Basophils Absolute: 0.1 10*3/uL (ref 0.0–0.1)
Basophils Relative: 1 %
Eosinophils Absolute: 0 10*3/uL (ref 0.0–0.5)
Eosinophils Relative: 1 %
HCT: 37.5 % — ABNORMAL LOW (ref 39.0–52.0)
Hemoglobin: 11.2 g/dL — ABNORMAL LOW (ref 13.0–17.0)
Immature Granulocytes: 0 %
Lymphocytes Relative: 20 %
Lymphs Abs: 1 10*3/uL (ref 0.7–4.0)
MCH: 28.9 pg (ref 26.0–34.0)
MCHC: 29.9 g/dL — ABNORMAL LOW (ref 30.0–36.0)
MCV: 96.9 fL (ref 80.0–100.0)
Monocytes Absolute: 0.4 10*3/uL (ref 0.1–1.0)
Monocytes Relative: 7 %
Neutro Abs: 3.5 10*3/uL (ref 1.7–7.7)
Neutrophils Relative %: 71 %
Platelets: 345 10*3/uL (ref 150–400)
RBC: 3.87 MIL/uL — ABNORMAL LOW (ref 4.22–5.81)
RDW: 16 % — ABNORMAL HIGH (ref 11.5–15.5)
WBC: 4.9 10*3/uL (ref 4.0–10.5)
nRBC: 0 % (ref 0.0–0.2)

## 2020-02-07 LAB — BASIC METABOLIC PANEL
Anion gap: 7 (ref 5–15)
BUN: 16 mg/dL (ref 6–20)
CO2: 28 mmol/L (ref 22–32)
Calcium: 8.6 mg/dL — ABNORMAL LOW (ref 8.9–10.3)
Chloride: 102 mmol/L (ref 98–111)
Creatinine, Ser: 0.88 mg/dL (ref 0.61–1.24)
GFR calc Af Amer: >60 mL/min (ref >60)
GFR calc non Af Amer: >60 mL/min (ref >60)
Glucose, Bld: 91 mg/dL (ref 70–99)
Potassium: 4.5 mmol/L (ref 3.5–5.1)
Sodium: 137 mmol/L (ref 135–145)

## 2020-02-07 MED ORDER — TRAMADOL HCL 50 MG PO TABS: 50.0000 mg | ORAL_TABLET | Freq: Two times a day (BID) | ORAL | Status: DC | PRN

## 2020-02-07 MED ORDER — CEPHALEXIN 500 MG PO CAPS
500.0000 mg | ORAL_CAPSULE | Freq: Three times a day (TID) | ORAL | Status: DC
  Filled 2020-02-07: qty 1

## 2020-02-07 MED ORDER — KETOROLAC TROMETHAMINE 15 MG/ML IJ SOLN
15.0000 mg | Freq: Four times a day (QID) | INTRAMUSCULAR | Status: DC | PRN
  Administered 2020-02-07: 15 mg via INTRAVENOUS
  Filled 2020-02-07: qty 1

## 2020-02-07 NOTE — Care Management Important Message (Signed)
Important Message  Patient Details IM Letter given to [name] SW Case Manager to present to the Patient Name: Tracy Todd MRN: [id number] Date of Birth: 1966/02/20   Medicare Important Message Given:  Yes     Caren Macadam 02/07/2020, 11:02 AM

## 2020-02-07 NOTE — Progress Notes (Signed)
Refused VS check this morning.

## 2020-02-07 NOTE — Progress Notes (Addendum)
Unable to place foley, resistance met. Notified Urology RN for assistance. Before RN could assist patient voided very large amount of urine incont in bed. Assisted w/ hygiene, dressing changed d/t saturation. Denies any pain, appears happy and comfortable. Patient tolerated well. Bed left in low position w/ alarm on.

## 2020-02-07 NOTE — Progress Notes (Signed)
PROGRESS NOTE    Tracy Todd  ZJI:[id number] DOB: September 20, 1965 DOA: 02/05/2020 PCP: Chesley Noon, MD    Brief Narrative:  Patient admitted to the hospital with the working diagnosis of infected decubitus ulcers.  54 year old male with past medical history of Down syndrome, nonverbal, seizures, arthritis and ambulatory dysfunction.  Patient was brought to the hospital by his sister, with concerns of infected pressure wound ulcers on his hip and back.  Patient has been living in a skilled nursing facility after a hospitalization for syncope.  Patient has become nonambulatory, and apparently his wounds were worsening. On his initial physical examination his blood pressure was 98/60, 112/75, heart rate 56, oxygen saturation 98% he had moist mucous membranes, lungs clear to auscultation bilaterally, heart S1-S2, present rhythmic, soft abdomen, no lower extremity edema.  Left lateral hip pressure ulcer, stage IV, buttock ulcer with dressing in place. Sodium 143, potassium 4.2, chloride 104, bicarb 29, glucose 102, BUN 18, creatinine 0.9.  White count 5.9, hemoglobin 11.5, hematocrit 39.2, platelets 354.  SARS COVID-19 negative.  Pelvic CT with ulceration medially along the right buttock extending into the medial margin of the gluteus maximus muscle.  No signs of osteomyelitis.  Subacute superior endplate compression fracture L4.  Patient evaluated by surgery and recommended continue local wound care, no indication for debridement.     Assessment & Plan:   Principal Problem:   Wound infection Active Problems:   Arthritis   Down syndrome   Pressure injury of skin    1. 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  WBC has been stable with wbc at 4,9 and patient continue to be afebrile. Review pictures from wounds and recommendations from surgery team.   Will continue with aggressive wound care and will hold on systemic antibiotic  therapy. If patient has fever or purulence will resume antibiotic therapy, currently risk vs benefit, patient will have better outcomes with local wound care than with systemic therapy. This was discussed with his sister, and she is in agreement with this plan.   Continue with tramadol for pain control. Patient will need facility to continue his care. Follow with nutrition recommendations.   2. Dyslipidemia. On pravastatin.   3. Down syndrome/ depression. Patient not on psychotropic medications, confirmed by his sister, care giver.    4. Iron deficiency anemia. On iron supplements with good toleration.    Status is: Inpatient  Remains inpatient appropriate because:Unsafe d/c plan   Dispo: The patient is from: ALF              Anticipated d/c is to: SNF              Anticipated d/c date is: 1 day              Patient currently is medically stable to d/c.   DVT prophylaxis: Enoxaparin   Code Status:   full  Family Communication:  I spoke over the phone with the patient's sister about patient's  condition, plan of care, prognosis and all questions were addressed.     Nutrition Status: Nutrition Problem: Increased nutrient needs Etiology: acute illness, wound healing Signs/Symptoms: estimated needs Interventions: Juven, MVI, Prostat, Other (Comment) [employer])     Skin Documentation: Pressure Injury 02/05/20 Hip Left Stage 3 -  Full thickness tissue loss. Subcutaneous fat may be visible but bone, tendon or muscle are NOT exposed. pressure ulcer to left hip, slough and eschar present (Active)  02/05/20  2300  Location: Hip  Location Orientation: Left  Staging: Stage 3 -  Full thickness tissue loss. Subcutaneous fat may be visible but bone, tendon or muscle are NOT exposed.  Wound Description (Comments): pressure ulcer to left hip, slough and eschar present  Present on Admission: Yes     Pressure Injury 02/05/20 Coccyx Mid;Right Stage 4 - Full thickness tissue loss with  exposed bone, tendon or muscle. wound extends from sacrum to right buttock, pink bleeding wound bed with what looks like muscle visible (Active)  02/05/20 2300  Location: Coccyx  Location Orientation: Mid;Right  Staging: Stage 4 - Full thickness tissue loss with exposed bone, tendon or muscle.  Wound Description (Comments): wound extends from sacrum to right buttock, pink bleeding wound bed with what looks like muscle visible  Present on Admission: Yes     Consultants:   Surgery     Subjective: Patient not much interactive today, not in pain or dyspnea. Has been tolerating po well. His sister today is not at his bedside.   Objective: Vitals:   02/06/20 0313 02/06/20 0628 02/06/20 1802 02/06/20 2324  BP: 100/68 107/72 107/67 100/62  Pulse: 60 62 80 (!) 55  Resp: 16 14 17 17   Temp: 98.4 F (36.9 C) 98.2 F (36.8 C) (!) 97.5 F (36.4 C) 98.1 F (36.7 C)  TempSrc: Oral Oral Oral Axillary  SpO2:    98%  Weight:      Height: 5\' 4"  (1.626 m)       Intake/Output Summary (Last 24 hours) at 02/07/2020 1348 Last data filed at 02/07/2020 1100 Gross per 24 hour  Intake 738.66 ml  Output 1001 ml  Net -262.34 ml   Filed Weights   02/05/20 2300  Weight: 45.7 kg    Examination:   General: Not in pain or dyspnea, deconditioned  Neurology: Awake and alert, non focal  E ENT: positive pallor, no icterus, oral mucosa moist Cardiovascular: No JVD. S1-S2 present, rhythmic, no gallops, rubs, or murmurs. No lower extremity edema. Pulmonary: positive breath sounds bilaterally, adequate air movement, no wheezing, rhonchi or rales. Gastrointestinal. Abdomen with, no organomegaly, non tender, no rebound or guarding Skin. Pressure ulcers left hip and buttock.  Musculoskeletal: no joint deformities/ atrophy lower extremities.      Data Reviewed: I have personally reviewed following labs and imaging studies  CBC: Recent Labs  Lab 02/05/20 1140 02/06/20 0308 02/07/20 0556  WBC 5.9  5.0 4.9  NEUTROABS 4.7  --  3.5  HGB 11.5* 11.1* 11.2*  HCT 39.2 36.3* 37.5*  MCV 99.0 97.1 96.9  PLT 354 346 345   Basic Metabolic Panel: Recent Labs  Lab 02/05/20 1140 02/06/20 0308 02/07/20 0556  NA 143 140 137  K 4.2 3.7 4.5  CL 104 101 102  CO2 29 27 28   GLUCOSE 102* 89 91  BUN 18 15 16   CREATININE 0.90 0.80 0.88  CALCIUM 8.7* 8.7* 8.6*   GFR: Estimated Creatinine Clearance: 61.3 mL/min (by C-G formula based on SCr of 0.88 mg/dL). Liver Function Tests: Recent Labs  Lab 02/05/20 1140  AST 30  ALT 26  ALKPHOS 63  BILITOT 0.4  PROT 6.6  ALBUMIN 3.0*   No results for input(s): LIPASE, AMYLASE in the last 168 hours. No results for input(s): AMMONIA in the last 168 hours. Coagulation Profile: No results for input(s): INR, PROTIME in the last 168 hours. Cardiac Enzymes: No results for input(s): CKTOTAL, CKMB, CKMBINDEX, TROPONINI in the last 168 hours. BNP (last 3 results)  No results for input(s): PROBNP in the last 8760 hours. HbA1C: No results for input(s): HGBA1C in the last 72 hours. CBG: No results for input(s): GLUCAP in the last 168 hours. Lipid Profile: No results for input(s): CHOL, HDL, LDLCALC, TRIG, CHOLHDL, LDLDIRECT in the last 72 hours. Thyroid Function Tests: No results for input(s): TSH, T4TOTAL, FREET4, T3FREE, THYROIDAB in the last 72 hours. Anemia Panel: No results for input(s): VITAMINB12, FOLATE, FERRITIN, TIBC, IRON, RETICCTPCT in the last 72 hours.    Radiology Studies: I have reviewed all of the imaging during this hospital visit personally     Scheduled Meds: . vitamin C  500 mg Oral Daily  . aspirin EC  81 mg Oral Daily  . clopidogrel  75 mg Oral Daily  . collagenase   Topical Daily  . diclofenac Sodium  2 g Topical TID  . enoxaparin (LOVENOX) injection  40 mg Subcutaneous Q24H  . feeding supplement (KATE FARMS STANDARD 1.4)  325 mL Oral BID BM  . feeding supplement (PRO-STAT SUGAR FREE 64)  30 mL Oral BID  . iron  polysaccharides  150 mg Oral Daily  . lactobacillus  1 g Oral TID WC  . loratadine  10 mg Oral Daily  . multivitamin with minerals  1 tablet Oral Daily  . nutrition supplement (JUVEN)  1 packet Oral BID BM  . pravastatin  40 mg Oral q1800  . vitamin B-12  500 mcg Oral Daily  . zinc sulfate  220 mg Oral Daily   Continuous Infusions:   LOS: 2 days        Luca Burston Annett Gula, MD

## 2020-02-07 NOTE — TOC Progression Note (Addendum)
Transition of Care [facility]) - Progression Note    Patient Details  Name: Tracy Todd MRN: [id number] Date of Birth: Nov 12, 1965  Transition of Care [facility]) CM/SW [place], LCSW Phone Number: 02/07/2020, 12:52 PM  Clinical Narrative:    CSW met with the patient sister and father at bedside. The patient AFL home will not be ready to accept in time of the patient discharge. Patient father actively working to complete paperwork and complete the process. Patient will discharge to SNF in the interim. CSW provided the patient sister and father with a Medicare.gov list of rated facilities. CSW waiting for facility choice.   Barrier to discharge: PASSR manuel screening. PASRR pending at this time.  Patient will need covid test 24-48 hours prior to discharge   Expected Discharge Plan: [place] Barriers to Discharge: [place] [facility]), Continued Medical Work up  Expected Discharge Plan and Services Expected Discharge Plan: [place] In-house Referral: Clinical Social Work Discharge Planning Services: CM Consult   Living arrangements for the past 2 months: [place]                                       Social Determinants of Health (SDOH) Interventions    Readmission Risk Interventions No flowsheet data found.

## 2020-02-07 NOTE — Progress Notes (Signed)
Patient's sister alerted staff that patient is crying and complaining of chest pain. RN attended to patient. Patient is crying, grimacing. Asked patient about pain, patient confirmed and pointed to middle of chest, just left of sternum just under nipple line. RRR, no adventicious heart sounds. VS taken and stable. EKG taken, SR no arrythmia noted. MD notified. Order placed for toradol for pain.   Discussed w/ MD patient has not voided. Rec'd order to place foley. Discussed new orders and poc w/ patient's sister, verbalizes understanding and agrees to plan.

## 2020-02-08 MED ORDER — CHLORHEXIDINE GLUCONATE CLOTH 2 % EX PADS
6.0000 | MEDICATED_PAD | Freq: Every day | CUTANEOUS | Status: DC
  Administered 2020-02-08 – 2020-02-10 (×3): 6 via TOPICAL

## 2020-02-08 NOTE — Progress Notes (Signed)
Physical Therapy Treatment Patient Details Name: Tracy Todd MRN: [id number] DOB: 1965/11/21 Today's Date: 02/08/2020    History of Present Illness 54 y.o. male with medical history significant of Down syndrome, seizures and OA bil knees presenting from SNF with sacral and L hip decubitus and inability to ambulate - per sister, pt was ambulatory when admitted to SNF in April, 21.      PT Comments     Pt reluctant/resistant to mobilization despite encouragement of sister and requiring increased assist to transfer to recliner to EOB sitting and back to bed.  Pt positioned in R side ly with pillow support behind   Follow Up Recommendations  Other (comment)     Equipment Recommendations  None recommended by PT    Recommendations for Other Services       Precautions / Restrictions Precautions Precautions: Fall Restrictions Weight Bearing Restrictions: No    Mobility  Bed Mobility Overal bed mobility: Needs Assistance Bed Mobility: Sit to Supine     Supine to sit: Max assist;+2 for physical assistance;+2 for safety/equipment Sit to supine: Mod assist   General bed mobility comments: Pt reluctant to mobilize despite encouragement of sisters.  Increased assist required with pt pushing posteriorly resisting  Transfers Overall transfer level: Needs assistance Equipment used: None Transfers: Sit to/from [employer] Sit to Stand: Max assist;+2 physical assistance;+2 safety/equipment;From elevated surface Stand pivot transfers: Max assist;+2 physical assistance;+2 safety/equipment;From elevated surface       General transfer comment: Pt min WB on LEs - stand pvt bed to chair.  Ambulation/Gait                 Stairs             Wheelchair Mobility    Modified Rankin (Stroke Patients Only)       Balance Overall balance assessment: Needs assistance Sitting-balance support: Feet supported;Bilateral upper extremity supported Sitting  balance-Leahy Scale: Poor Sitting balance - Comments: Initially leaning back (mostly resistive) but improved with time Postural control: Posterior lean Standing balance support: Bilateral upper extremity supported Standing balance-Leahy Scale: Zero                              Cognition Arousal/Alertness: Awake/alert Behavior During Therapy: Impulsive Overall Cognitive Status: History of cognitive impairments - at baseline                                 General Comments: Down Syndrome, mostly non verbal      Exercises      General Comments        Pertinent Vitals/Pain Pain Assessment: Faces Pain Location: patinet unable to report Pain Descriptors / Indicators: Grimacing;Guarding Pain Intervention(s): Limited activity within patient's tolerance;Monitored during session    Home Living                      Prior Function            PT Goals (current goals can now be found in the care plan section) Acute Rehab PT Goals Patient Stated Goal: improve mobility and participation PT Goal Formulation: Patient unable to participate in goal setting Time For Goal Achievement: 02/20/20 Potential to Achieve Goals: Fair Progress towards PT goals: Not progressing toward goals - comment    Frequency    Min 3X/week      PT Plan Current plan  remains appropriate    Co-evaluation              AM-PAC PT "6 Clicks" Mobility   Outcome Measure  Help needed turning from your back to your side while in a flat bed without using bedrails?: None Help needed moving from lying on your back to sitting on the side of a flat bed without using bedrails?: A Lot Help needed moving to and from a bed to a chair (including a wheelchair)?: A Lot Help needed standing up from a chair using your arms (e.g., wheelchair or bedside chair)?: A Lot Help needed to walk in hospital room?: Total Help needed climbing 3-5 steps with a railing? : Total 6 Click Score:  12    End of Session Equipment Utilized During Treatment: Gait belt Activity Tolerance: Patient tolerated treatment well Patient left: in bed;with call bell/phone within reach;with family/visitor present Nurse Communication: Mobility status;Need for lift equipment;Other (comment) PT Visit Diagnosis: Difficulty in walking, not elsewhere classified (R26.2);Muscle weakness (generalized) (M62.81)     Time: 6283-6629 PT Time Calculation (min) (ACUTE ONLY): 12 min  Charges:  $Therapeutic Activity: 8-22 mins                     [place] Pager [phone] Office [phone]    Kielyn Kardell 02/08/2020, 4:17 PM

## 2020-02-08 NOTE — Plan of Care (Signed)
  Problem: Nutrition: Goal: Adequate nutrition will be maintained Outcome: Progressing   Problem: Pain Managment: Goal: General experience of comfort will improve Outcome: Progressing   

## 2020-02-08 NOTE — Progress Notes (Signed)
Physical Therapy Treatment Patient Details Name: Tracy Todd MRN: [id number] DOB: 1966/01/02 Today's Date: 02/08/2020    History of Present Illness 54 y.o. male with medical history significant of Down syndrome, seizures and OA bil knees presenting from SNF with sacral and L hip decubitus and inability to ambulate - per sister, pt was ambulatory when admitted to SNF in April, 21.      PT Comments    Pt reluctant/resistant to mobilization despite encouragement of sisters and requiring increased assist to transfer to EOB sitting and up to chair.   Follow Up Recommendations  Other (comment)     Equipment Recommendations  None recommended by PT    Recommendations for Other Services       Precautions / Restrictions Precautions Precautions: Fall Restrictions Weight Bearing Restrictions: No    Mobility  Bed Mobility Overal bed mobility: Needs Assistance Bed Mobility: Supine to Sit     Supine to sit: Max assist;+2 for physical assistance;+2 for safety/equipment     General bed mobility comments: Pt reluctant to mobilize despite encouragement of sisters.  Increased assist required with pt pushing posteriorly resisting  Transfers Overall transfer level: Needs assistance Equipment used: None (pt resistant to use of RW) Transfers: Sit to/from [facility] Sit to Stand: Max assist;+2 physical assistance;+2 safety/equipment;From elevated surface Stand pivot transfers: Max assist;+2 physical assistance;+2 safety/equipment;From elevated surface       General transfer comment: Pt min WB on LEs - stand pvt bed to chair.  Ambulation/Gait                 Stairs             Wheelchair Mobility    Modified Rankin (Stroke Patients Only)       Balance Overall balance assessment: Needs assistance Sitting-balance support: Feet supported;Bilateral upper extremity supported Sitting balance-Leahy Scale: Poor Sitting balance - Comments: Initially  leaning back (mostly resistive) but improved with time Postural control: Posterior lean Standing balance support: Bilateral upper extremity supported Standing balance-Leahy Scale: Zero                              Cognition Arousal/Alertness: Awake/alert Behavior During Therapy: Impulsive Overall Cognitive Status: History of cognitive impairments - at baseline                                 General Comments: Down Syndrome, mostly non verbal      Exercises      General Comments        Pertinent Vitals/Pain Pain Assessment: Faces Pain Location: patinet unable to report Pain Descriptors / Indicators: Grimacing;Guarding Pain Intervention(s): Limited activity within patient's tolerance;Monitored during session    Home Living                      Prior Function            PT Goals (current goals can now be found in the care plan section) Acute Rehab PT Goals Patient Stated Goal: improve mobility and participation PT Goal Formulation: Patient unable to participate in goal setting Time For Goal Achievement: 02/20/20 Potential to Achieve Goals: Fair Progress towards PT goals: Not progressing toward goals - comment (Pt resistant to participation with PT)    Frequency    Min 3X/week      PT Plan Current plan remains appropriate  Co-evaluation              AM-PAC PT "6 Clicks" Mobility   Outcome Measure  Help needed turning from your back to your side while in a flat bed without using bedrails?: None Help needed moving from lying on your back to sitting on the side of a flat bed without using bedrails?: A Lot Help needed moving to and from a bed to a chair (including a wheelchair)?: A Lot Help needed standing up from a chair using your arms (e.g., wheelchair or bedside chair)?: A Lot Help needed to walk in hospital room?: Total Help needed climbing 3-5 steps with a railing? : Total 6 Click Score: 12    End of Session  Equipment Utilized During Treatment: Gait belt Activity Tolerance: Patient tolerated treatment well Patient left: in chair;with call bell/phone within reach;with chair alarm set;with family/visitor present Nurse Communication: Mobility status;Need for lift equipment;Other (comment) PT Visit Diagnosis: Difficulty in walking, not elsewhere classified (R26.2);Muscle weakness (generalized) (M62.81)     Time: 9978-7765 PT Time Calculation (min) (ACUTE ONLY): 18 min  Charges:  $Therapeutic Activity: 8-22 mins                     Mauro Kaufmann PT Acute Rehabilitation Services Pager [phone] Office [phone]    Tracy Todd 02/08/2020, 4:12 PM

## 2020-02-08 NOTE — Progress Notes (Addendum)
PROGRESS NOTE    Tracy Todd  PJK:[id number] DOB: March 02, 1966 DOA: 02/05/2020 PCP: Eartha Inch, MD    Brief Narrative:  Patient admitted to the hospital with the working diagnosis of infected decubitus ulcers.  54 year old male with past medical history of Down syndrome, nonverbal, seizures, arthritis and ambulatory dysfunction. Patient was brought to the hospital by his sister, with concerns of infected pressure wound ulcers on his hip and back. Patient has been living in a skilled nursing facility after a hospitalization for syncope. Patient has become nonambulatory,and apparently his wounds were worsening. On his initial physical examination his blood pressure was 98/60, 112/75, heart rate 56, oxygen saturation 98% he had moist mucous membranes, lungs clear to auscultation bilaterally, heart S1-S2, present rhythmic, soft abdomen, no lower extremity edema. Left lateral hip pressure ulcer, stage IV, buttock ulcer with dressing in place. Sodium 143, potassium 4.2, chloride 104, bicarb 29, glucose 102, BUN 18, creatinine 0.9. White count 5.9, hemoglobin 11.5, hematocrit 39.2, platelets 354. SARS COVID-19 negative. Pelvic CT with ulceration medially along the right buttock extending into the medial margin of the gluteus maximus muscle. No signs of osteomyelitis. Subacute superior endplate compression fracture L4.  Patient evaluated by surgery and recommended continue local wound care, no indication for debridement.   Will continue with aggressive wound care and will hold on systemic antibiotic therapy. If patient has fever or purulence will resume antibiotic therapy, currently risk vs benefit, patient will have better outcomes with local wound care than with systemic therapy. This was discussed with his sister, and she is in agreement with this plan.   No patient is waiting for placement.    Assessment & Plan:   Principal Problem:   Wound infection Active Problems:    Arthritis   Down syndrome   Pressure injury of skin    1. Infected decubitus pressure ulcers (present on admission), stage3lateral hip and stage 4buttock.  On aggressive wound care with good toleration.    Pain control with tramadol, continue nutritional supplements.   2. Dyslipidemia. Continue with pravastatin.   3. Down syndrome/ depression. Patient not on psychotropic medications, confirmed by his sister, care giver.  Needs PASRR before transfer to SNF.   4. Iron deficiency anemia. Continue iron supplements with good toleration.   5. New urinary retention. Patient required in and out catheterization yesterday for urinary retention. Continue pain control with ketorolac.   Foley catheter placed, will continue close monitoring and plan for a voiding trial before discharge.   6. Underweight, mild to moderate calorie protein malnutrition. Continue with nutritional supplementation. Will need close follow up as outpatient.   Status is: Inpatient  Remains inpatient appropriate because:Unsafe d/c plan   Dispo: The patient is from: Home              Anticipated d/c is to: SNF              Anticipated d/c date is: 1 day              Patient currently is medically stable to d/c.   DVT prophylaxis: Enoxaparin   Code Status:   full  Family Communication:  I spoke with patient's sister at the bedside, we talked in detail about patient's condition, plan of care and prognosis and all questions were addressed.      Nutrition Status: Nutrition Problem: Increased nutrient needs Etiology: acute illness, wound healing Signs/Symptoms: estimated needs Interventions: Juven, MVI, Prostat, Other (Comment) [employer])     Skin Documentation: Pressure  Injury 02/05/20 Hip Left Stage 3 -  Full thickness tissue loss. Subcutaneous fat may be visible but bone, tendon or muscle are NOT exposed. pressure ulcer to left hip, slough and eschar present (Active)  02/05/20 2300  Location: Hip    Location Orientation: Left  Staging: Stage 3 -  Full thickness tissue loss. Subcutaneous fat may be visible but bone, tendon or muscle are NOT exposed.  Wound Description (Comments): pressure ulcer to left hip, slough and eschar present  Present on Admission: Yes     Pressure Injury 02/05/20 Coccyx Mid;Right Stage 4 - Full thickness tissue loss with exposed bone, tendon or muscle. wound extends from sacrum to right buttock, pink bleeding wound bed with what looks like muscle visible (Active)  02/05/20 2300  Location: Coccyx  Location Orientation: Mid;Right  Staging: Stage 4 - Full thickness tissue loss with exposed bone, tendon or muscle.  Wound Description (Comments): wound extends from sacrum to right buttock, pink bleeding wound bed with what looks like muscle visible  Present on Admission: Yes     Consultants:   Surgery   Subjective: Patient is moe reactive than yesterday, no apparent pain or dyspnea. Yesterday had persistent urinary retention and had a foley cathter placed.   Objective: Vitals:   02/07/20 1624 02/07/20 2137 02/08/20 0611 02/08/20 1342  BP: 99/68 109/81 129/86 124/71  Pulse: 72 62 61 81  Resp: 18 16 16 16   Temp:  98.7 F (37.1 C) (!) 97.5 F (36.4 C) (!) 97.2 F (36.2 C)  TempSrc:   Oral   SpO2: 98% 98% 99% 98%  Weight:      Height:        Intake/Output Summary (Last 24 hours) at 02/08/2020 1352 Last data filed at 02/08/2020 0086 Gross per 24 hour  Intake 480 ml  Output 350 ml  Net 130 ml   Filed Weights   02/05/20 2300  Weight: 45.7 kg    Examination:   General: Not in pain or dyspnea, deconditioned  Neurology: Awake and alert, non focal  E ENT: mild pallor, no icterus, oral mucosa moist Cardiovascular: No JVD. S1-S2 present, rhythmic, no gallops, rubs, or murmurs. No lower extremity edema. Pulmonary: positive breath sounds bilaterally, adequate air movement, no wheezing, rhonchi or rales. Gastrointestinal. Abdomen with no organomegaly,  non tender, no rebound or guarding Skin. Left hip and sacrum pressure ulcer with dressing in place.  Musculoskeletal: no joint deformities     Data Reviewed: I have personally reviewed following labs and imaging studies  CBC: Recent Labs  Lab 02/05/20 1140 02/06/20 0308 02/07/20 0556  WBC 5.9 5.0 4.9  NEUTROABS 4.7  --  3.5  HGB 11.5* 11.1* 11.2*  HCT 39.2 36.3* 37.5*  MCV 99.0 97.1 96.9  PLT 354 346 761   Basic Metabolic Panel: Recent Labs  Lab 02/05/20 1140 02/06/20 0308 02/07/20 0556  NA 143 140 137  K 4.2 3.7 4.5  CL 104 101 102  CO2 29 27 28   GLUCOSE 102* 89 91  BUN 18 15 16   CREATININE 0.90 0.80 0.88  CALCIUM 8.7* 8.7* 8.6*   GFR: Estimated Creatinine Clearance: 61.3 mL/min (by C-G formula based on SCr of 0.88 mg/dL). Liver Function Tests: Recent Labs  Lab 02/05/20 1140  AST 30  ALT 26  ALKPHOS 63  BILITOT 0.4  PROT 6.6  ALBUMIN 3.0*   No results for input(s): LIPASE, AMYLASE in the last 168 hours. No results for input(s): AMMONIA in the last 168 hours. Coagulation Profile: No  results for input(s): INR, PROTIME in the last 168 hours. Cardiac Enzymes: No results for input(s): CKTOTAL, CKMB, CKMBINDEX, TROPONINI in the last 168 hours. BNP (last 3 results) No results for input(s): PROBNP in the last 8760 hours. HbA1C: No results for input(s): HGBA1C in the last 72 hours. CBG: No results for input(s): GLUCAP in the last 168 hours. Lipid Profile: No results for input(s): CHOL, HDL, LDLCALC, TRIG, CHOLHDL, LDLDIRECT in the last 72 hours. Thyroid Function Tests: No results for input(s): TSH, T4TOTAL, FREET4, T3FREE, THYROIDAB in the last 72 hours. Anemia Panel: No results for input(s): VITAMINB12, FOLATE, FERRITIN, TIBC, IRON, RETICCTPCT in the last 72 hours.    Radiology Studies: I have reviewed all of the imaging during this hospital visit personally     Scheduled Meds: . vitamin C  500 mg Oral Daily  . aspirin EC  81 mg Oral Daily  .  clopidogrel  75 mg Oral Daily  . collagenase   Topical Daily  . diclofenac Sodium  2 g Topical TID  . enoxaparin (LOVENOX) injection  40 mg Subcutaneous Q24H  . feeding supplement (KATE FARMS STANDARD 1.4)  325 mL Oral BID BM  . feeding supplement (PRO-STAT SUGAR FREE 64)  30 mL Oral BID  . iron polysaccharides  150 mg Oral Daily  . lactobacillus  1 g Oral TID WC  . loratadine  10 mg Oral Daily  . multivitamin with minerals  1 tablet Oral Daily  . nutrition supplement (JUVEN)  1 packet Oral BID BM  . pravastatin  40 mg Oral q1800  . vitamin B-12  500 mcg Oral Daily  . zinc sulfate  220 mg Oral Daily   Continuous Infusions:   LOS: 3 days        Feliz Lincoln Annett Gula, MD

## 2020-02-09 MED ORDER — FINASTERIDE 5 MG PO TABS
5.0000 mg | ORAL_TABLET | Freq: Every day | ORAL | Status: DC
  Administered 2020-02-09 – 2020-02-13 (×5): 5 mg via ORAL
  Filled 2020-02-09 (×5): qty 1

## 2020-02-09 NOTE — Progress Notes (Signed)
PROGRESS NOTE    Tracy Todd  OMB:[id number] DOB: 05/03/1966 DOA: 02/05/2020 PCP: Eartha Inch, MD    Brief Narrative:  Patient admitted to the hospital with the working diagnosis of infected decubitus ulcers.  54 year old male with past medical history of Down syndrome, nonverbal, seizures, arthritis and ambulatory dysfunction. Patient was brought to the hospital by his sister, with concerns of infected pressure wound ulcers on his hip and back. Patient has been living in a skilled nursing facility after a hospitalization for syncope. Patient has become nonambulatory,and apparently his wounds were worsening. On his initial physical examination his blood pressure was 98/60, 112/75, heart rate 56, oxygen saturation 98% he had moist mucous membranes, lungs clear to auscultation bilaterally, heart S1-S2, present rhythmic, soft abdomen, no lower extremity edema. Left lateral hip pressure ulcer, stage IV, buttock ulcer with dressing in place. Sodium 143, potassium 4.2, chloride 104, bicarb 29, glucose 102, BUN 18, creatinine 0.9. White count 5.9, hemoglobin 11.5, hematocrit 39.2, platelets 354. SARS COVID-19 negative. Pelvic CT with ulceration medially along the right buttock extending into the medial margin of the gluteus maximus muscle. No signs of osteomyelitis. Subacute superior endplate compression fracture L4.  Patient evaluated by surgery and recommended continue local wound care, no indication for debridement.  Will continue with aggressive wound care and will hold on systemic antibiotic therapy. If patient has fever or purulence will resume antibiotic therapy, currently risk vs benefit, patient will have better outcomes with local wound care than with systemic therapy. This was discussed with his sister, and she is in agreement with this plan.   No patient is waiting for placement.     Assessment & Plan:   Principal Problem:   Wound infection Active Problems:    Arthritis   Down syndrome   Pressure injury of skin   1. Infected decubitus pressure ulcers (present on admission), stage3lateral hip and stage 4buttock. Continue with aggressive wound care with good toleration.    Ontramadol for pain control and on nutritional supplements.   2. Dyslipidemia.Onpravastatin.   3. Down syndrome/ depression.Patient not on psychotropic medications, confirmed by his sister, care giver.Needs PASRR before transfer to SNF.   4. Iron deficiency anemia.On iron supplementswith good toleration.  5. New urinary retention. Patient has a foley catheter in place due to urinary retention, will start patient in finasteride and will do a voiding tral in am.   No clinical signs of urinary tract infection.   6. Underweight, mild to moderate calorie protein malnutrition. Nutritional supplementation.    Status is: Inpatient  Remains inpatient appropriate because:Unsafe d/c plan   Dispo: The patient is from: Home              Anticipated d/c is to: SNF              Anticipated d/c date is: 1 day              Patient currently is medically stable to d/c.   DVT prophylaxis: Enoxaparin   Code Status:   full  Family Communication:  I spoke with patient's sister at the bedside, we talked in detail about patient's condition, plan of care and prognosis and all questions were addressed.      Nutrition Status: Nutrition Problem: Increased nutrient needs Etiology: acute illness, wound healing Signs/Symptoms: estimated needs Interventions: Juven, MVI, Prostat, Other (Comment) [employer])     Skin Documentation: Pressure Injury 02/05/20 Hip Left Stage 3 -  Full thickness tissue loss. Subcutaneous fat may be  visible but bone, tendon or muscle are NOT exposed. pressure ulcer to left hip, slough and eschar present (Active)  02/05/20 2300  Location: Hip  Location Orientation: Left  Staging: Stage 3 -  Full thickness tissue loss. Subcutaneous fat  may be visible but bone, tendon or muscle are NOT exposed.  Wound Description (Comments): pressure ulcer to left hip, slough and eschar present  Present on Admission: Yes     Pressure Injury 02/05/20 Coccyx Mid;Right Stage 4 - Full thickness tissue loss with exposed bone, tendon or muscle. wound extends from sacrum to right buttock, pink bleeding wound bed with what looks like muscle visible (Active)  02/05/20 2300  Location: Coccyx  Location Orientation: Mid;Right  Staging: Stage 4 - Full thickness tissue loss with exposed bone, tendon or muscle.  Wound Description (Comments): wound extends from sacrum to right buttock, pink bleeding wound bed with what looks like muscle visible  Present on Admission: Yes     Consultants:   Surgery    Subjective: Patient calm with no agitation. His sister is at the bedside. He continue to have foley catheter. No nausea or vomiting. No apparent pain.   Objective: Vitals:   02/08/20 1342 02/08/20 2119 02/09/20 0524 02/09/20 0554  BP: 124/71 118/75 112/84 120/81  Pulse: 81 66 72 (!) 52  Resp: 16 16 16 18   Temp: (!) 97.2 F (36.2 C) (!) 97.5 F (36.4 C) 98.6 F (37 C) 97.6 F (36.4 C)  TempSrc:    Oral  SpO2: 98% 99% 98% 100%  Weight:      Height:        Intake/Output Summary (Last 24 hours) at 02/09/2020 1128 Last data filed at 02/09/2020 0908 Gross per 24 hour  Intake 940 ml  Output 1950 ml  Net -1010 ml   Filed Weights   02/05/20 2300  Weight: 45.7 kg    Examination:   General: Not in pain or dyspnea, deconditioned  Neurology: Awake, not much interactive E ENT: no pallor, no icterus, oral mucosa moist Cardiovascular: No JVD. S1-S2 present, rhythmic, no gallops, rubs, or murmurs. No lower extremity edema. Pulmonary: positive breath sounds bilaterally, adequate air movement, no wheezing, rhonchi or rales. Gastrointestinal. Abdomen with no organomegaly, non tender, no rebound or guarding Skin. No rashes Musculoskeletal: no joint  deformities     Data Reviewed: I have personally reviewed following labs and imaging studies  CBC: Recent Labs  Lab 02/05/20 1140 02/06/20 0308 02/07/20 0556  WBC 5.9 5.0 4.9  NEUTROABS 4.7  --  3.5  HGB 11.5* 11.1* 11.2*  HCT 39.2 36.3* 37.5*  MCV 99.0 97.1 96.9  PLT 354 346 345   Basic Metabolic Panel: Recent Labs  Lab 02/05/20 1140 02/06/20 0308 02/07/20 0556  NA 143 140 137  K 4.2 3.7 4.5  CL 104 101 102  CO2 29 27 28   GLUCOSE 102* 89 91  BUN 18 15 16   CREATININE 0.90 0.80 0.88  CALCIUM 8.7* 8.7* 8.6*   GFR: Estimated Creatinine Clearance: 61.3 mL/min (by C-G formula based on SCr of 0.88 mg/dL). Liver Function Tests: Recent Labs  Lab 02/05/20 1140  AST 30  ALT 26  ALKPHOS 63  BILITOT 0.4  PROT 6.6  ALBUMIN 3.0*   No results for input(s): LIPASE, AMYLASE in the last 168 hours. No results for input(s): AMMONIA in the last 168 hours. Coagulation Profile: No results for input(s): INR, PROTIME in the last 168 hours. Cardiac Enzymes: No results for input(s): CKTOTAL, CKMB, CKMBINDEX, TROPONINI in  the last 168 hours. BNP (last 3 results) No results for input(s): PROBNP in the last 8760 hours. HbA1C: No results for input(s): HGBA1C in the last 72 hours. CBG: No results for input(s): GLUCAP in the last 168 hours. Lipid Profile: No results for input(s): CHOL, HDL, LDLCALC, TRIG, CHOLHDL, LDLDIRECT in the last 72 hours. Thyroid Function Tests: No results for input(s): TSH, T4TOTAL, FREET4, T3FREE, THYROIDAB in the last 72 hours. Anemia Panel: No results for input(s): VITAMINB12, FOLATE, FERRITIN, TIBC, IRON, RETICCTPCT in the last 72 hours.    Radiology Studies: I have reviewed all of the imaging during this hospital visit personally     Scheduled Meds: . vitamin C  500 mg Oral Daily  . aspirin EC  81 mg Oral Daily  . Chlorhexidine Gluconate Cloth  6 each Topical Daily  . clopidogrel  75 mg Oral Daily  . collagenase   Topical Daily  .  diclofenac Sodium  2 g Topical TID  . enoxaparin (LOVENOX) injection  40 mg Subcutaneous Q24H  . feeding supplement (KATE FARMS STANDARD 1.4)  325 mL Oral BID BM  . feeding supplement (PRO-STAT SUGAR FREE 64)  30 mL Oral BID  . iron polysaccharides  150 mg Oral Daily  . lactobacillus  1 g Oral TID WC  . loratadine  10 mg Oral Daily  . multivitamin with minerals  1 tablet Oral Daily  . nutrition supplement (JUVEN)  1 packet Oral BID BM  . pravastatin  40 mg Oral q1800  . vitamin B-12  500 mcg Oral Daily  . zinc sulfate  220 mg Oral Daily   Continuous Infusions:   LOS: 4 days        Juleen Sorrels Gerome Apley, MD

## 2020-02-10 NOTE — Progress Notes (Signed)
PROGRESS NOTE    Tracy Todd  UJW:[id number] DOB: Apr 24, 1966 DOA: 02/05/2020 PCP: Chesley Noon, MD    Brief Narrative:  Patient admitted to the hospital with the working diagnosis of infected decubitus ulcers.  54 year old male with past medical history of Down syndrome, nonverbal, seizures, arthritis and ambulatory dysfunction. Patient was brought to the hospital by his sister, with concerns of infected pressure wound ulcers on his hip and back. Patient has been living in a skilled nursing facility after a hospitalization for syncope. Patient has become nonambulatory,and apparently his wounds were worsening. On his initial physical examination his blood pressure was 98/60, 112/75, heart rate 56, oxygen saturation 98% he had moist mucous membranes, lungs clear to auscultation bilaterally, heart S1-S2, present rhythmic, soft abdomen, no lower extremity edema. Left lateral hip pressure ulcer, stage IV, buttock ulcer with dressing in place. Sodium 143, potassium 4.2, chloride 104, bicarb 29, glucose 102, BUN 18, creatinine 0.9. White count 5.9, hemoglobin 11.5, hematocrit 39.2, platelets 354. SARS COVID-19 negative. Pelvic CT with ulceration medially along the right buttock extending into the medial margin of the gluteus maximus muscle. No signs of osteomyelitis. Subacute superior endplate compression fracture L4.  Patient evaluated by surgery and recommended continue local wound care, no indication for debridement.  Will continue with aggressive wound care and will hold on systemic antibiotic therapy. If patient has fever or purulence will resume antibiotic therapy, currently risk vs benefit, patient will have better outcomes with local wound care than with systemic therapy. This was discussed with his sister, and she is in agreement with this plan.  No patient is waiting for placement.   Assessment & Plan:   Principal Problem:   Wound infection Active Problems:    Arthritis   Down syndrome   Pressure injury of skin    1. Infected decubitus pressure ulcers (present on admission), stage3lateral hip and stage 4buttock. Onaggressive wound carewith good toleration.  Continue with tramadol for pain control.  On Nutritional supplements.  2. Dyslipidemia/ right ICA occlusionContinue withpravastatin.   On dual antiplatelet therapy asa and clopidogrel for 3 mo ( complete on 02/2020), then continue aspirin alone.   3. Down syndrome/ depression.Patient not on psychotropic medications, confirmed by his sister, care giver.Needs PASRR before transfer to SNF.  4. Iron deficiency anemia.Continue withiron supplementswith good toleration.  5. New urinary retention. Patient has a foley catheter in place due to urinary retention. Continue with finasteride, and will plan for voiding trial in am.   No clinical signs of urinary tract infection.   6. Underweight, mild to moderate calorie protein malnutrition. Continue with nutritional supplementation.    Status is: Inpatient  Remains inpatient appropriate because:Unsafe d/c plan   Dispo: The patient is from: Home              Anticipated d/c is to: SNF              Anticipated d/c date is: 1 day              Patient currently is medically stable to d/c.   DVT prophylaxis: Enoxaparin   Code Status:   full  Family Communication:  I spoke with patient's sister at the bedside, we talked in detail about patient's condition, plan of care and prognosis and all questions were addressed.      Nutrition Status: Nutrition Problem: Increased nutrient needs Etiology: acute illness, wound healing Signs/Symptoms: estimated needs Interventions: Juven, MVI, Prostat, Other (Comment) [employer])     Skin Documentation:  Pressure Injury 02/05/20 Hip Left Stage 3 -  Full thickness tissue loss. Subcutaneous fat may be visible but bone, tendon or muscle are NOT exposed. pressure ulcer to left  hip, slough and eschar present (Active)  02/05/20 2300  Location: Hip  Location Orientation: Left  Staging: Stage 3 -  Full thickness tissue loss. Subcutaneous fat may be visible but bone, tendon or muscle are NOT exposed.  Wound Description (Comments): pressure ulcer to left hip, slough and eschar present  Present on Admission: Yes     Pressure Injury 02/05/20 Coccyx Mid;Right Stage 4 - Full thickness tissue loss with exposed bone, tendon or muscle. wound extends from sacrum to right buttock, pink bleeding wound bed with what looks like muscle visible (Active)  02/05/20 2300  Location: Coccyx  Location Orientation: Mid;Right  Staging: Stage 4 - Full thickness tissue loss with exposed bone, tendon or muscle.  Wound Description (Comments): wound extends from sacrum to right buttock, pink bleeding wound bed with what looks like muscle visible  Present on Admission: Yes     Consultants:   Surgery    Subjective: Patient more awake and reactive today, not in pain or dyspnea. Continue to have foley catheter in place.   Objective: Vitals:   02/09/20 1500 02/09/20 2104 02/10/20 0633 02/10/20 1419  BP: 119/69 115/73 (!) 132/95 119/63  Pulse: 75 68 74 79  Resp: 18 16 15 16   Temp: 98 F (36.7 C) 98.9 F (37.2 C) 98.7 F (37.1 C)   TempSrc: Axillary     SpO2:  99% 99%   Weight:      Height:        Intake/Output Summary (Last 24 hours) at 02/10/2020 1454 Last data filed at 02/10/2020 1045 Gross per 24 hour  Intake 60 ml  Output 1500 ml  Net -1440 ml   Filed Weights   02/05/20 2300  Weight: 45.7 kg    Examination:   General: Not in pain or dyspnea. Deconditioned  Neurology: Awake and alert, non focal  E ENT: no pallor, no icterus, oral mucosa moist Cardiovascular: No JVD. S1-S2 present, rhythmic, no gallops, rubs, or murmurs. No lower extremity edema. Pulmonary: positive breath sounds bilaterally, adequate air movement, no wheezing, rhonchi or rales. Gastrointestinal.  Abdomen with no organomegaly, non tender, no rebound or guarding Skin. Pressure wounds with dressing in place.  Musculoskeletal: no joint deformities     Data Reviewed: I have personally reviewed following labs and imaging studies  CBC: Recent Labs  Lab 02/05/20 1140 02/06/20 0308 02/07/20 0556  WBC 5.9 5.0 4.9  NEUTROABS 4.7  --  3.5  HGB 11.5* 11.1* 11.2*  HCT 39.2 36.3* 37.5*  MCV 99.0 97.1 96.9  PLT 354 346 345   Basic Metabolic Panel: Recent Labs  Lab 02/05/20 1140 02/06/20 0308 02/07/20 0556  NA 143 140 137  K 4.2 3.7 4.5  CL 104 101 102  CO2 29 27 28   GLUCOSE 102* 89 91  BUN 18 15 16   CREATININE 0.90 0.80 0.88  CALCIUM 8.7* 8.7* 8.6*   GFR: Estimated Creatinine Clearance: 61.3 mL/min (by C-G formula based on SCr of 0.88 mg/dL). Liver Function Tests: Recent Labs  Lab 02/05/20 1140  AST 30  ALT 26  ALKPHOS 63  BILITOT 0.4  PROT 6.6  ALBUMIN 3.0*   No results for input(s): LIPASE, AMYLASE in the last 168 hours. No results for input(s): AMMONIA in the last 168 hours. Coagulation Profile: No results for input(s): INR, PROTIME in the last  168 hours. Cardiac Enzymes: No results for input(s): CKTOTAL, CKMB, CKMBINDEX, TROPONINI in the last 168 hours. BNP (last 3 results) No results for input(s): PROBNP in the last 8760 hours. HbA1C: No results for input(s): HGBA1C in the last 72 hours. CBG: No results for input(s): GLUCAP in the last 168 hours. Lipid Profile: No results for input(s): CHOL, HDL, LDLCALC, TRIG, CHOLHDL, LDLDIRECT in the last 72 hours. Thyroid Function Tests: No results for input(s): TSH, T4TOTAL, FREET4, T3FREE, THYROIDAB in the last 72 hours. Anemia Panel: No results for input(s): VITAMINB12, FOLATE, FERRITIN, TIBC, IRON, RETICCTPCT in the last 72 hours.    Radiology Studies: I have reviewed all of the imaging during this hospital visit personally     Scheduled Meds: . vitamin C  500 mg Oral Daily  . aspirin EC  81 mg Oral  Daily  . Chlorhexidine Gluconate Cloth  6 each Topical Daily  . clopidogrel  75 mg Oral Daily  . collagenase   Topical Daily  . diclofenac Sodium  2 g Topical TID  . enoxaparin (LOVENOX) injection  40 mg Subcutaneous Q24H  . feeding supplement (KATE FARMS STANDARD 1.4)  325 mL Oral BID BM  . feeding supplement (PRO-STAT SUGAR FREE 64)  30 mL Oral BID  . finasteride  5 mg Oral Daily  . iron polysaccharides  150 mg Oral Daily  . lactobacillus  1 g Oral TID WC  . loratadine  10 mg Oral Daily  . multivitamin with minerals  1 tablet Oral Daily  . nutrition supplement (JUVEN)  1 packet Oral BID BM  . pravastatin  40 mg Oral q1800  . vitamin B-12  500 mcg Oral Daily  . zinc sulfate  220 mg Oral Daily   Continuous Infusions:   LOS: 5 days        Galya Dunnigan Annett Gula, MD

## 2020-02-10 NOTE — TOC Progression Note (Signed)
Transition of Care [facility]) - Progression Note    Patient Details  Name: Tracy Todd MRN: [id number] Date of Birth: Oct 30, 1965  Transition of [facility]) CM/SW Contact  Clearance Coots, LCSW Phone Number: 02/10/2020, 8:57 AM  Clinical Narrative:    Barriers to discharge: PASRR manuel review pending.   Expected Discharge Plan: Skilled Nursing Facility Barriers to Discharge: Awaiting State Approval Cherlyn Roberts), Continued Medical Work up  Expected Discharge Plan and Services Expected Discharge Plan: Skilled Nursing Facility In-house Referral: Clinical Social Work Discharge Planning Services: CM Consult   Living arrangements for the past 2 months: Skilled Nursing Facility                                       Social Determinants of Health (SDOH) Interventions    Readmission Risk Interventions No flowsheet data found.

## 2020-02-10 NOTE — Care Management Important Message (Signed)
Important Message  Patient Details IM Letter given to Marcelle Smiling RN Case Manager to present to the Patient Name: Tracy Todd MRN: [id number] Date of Birth: January 27, 1966   Medicare Important Message Given:  Yes     Caren Macadam 02/10/2020, 11:47 AM

## 2020-02-11 NOTE — Progress Notes (Signed)
Physical Therapy Treatment Patient Details Name: Tracy Todd MRN: [id number] DOB: 1966/01/11 Today's Date: 02/11/2020    History of Present Illness 54 y.o. male with medical history significant of Down syndrome, seizures and OA bil knees presenting from SNF with sacral and L hip decubitus and inability to ambulate - per sister, pt was ambulatory when admitted to SNF in April, 21.      PT Comments    Sister present during session.  "would love to see him in the recliner".  General bed mobility comments: Pt reluctant to mobilize despite encouragement of sisters.  Increased assist required with pt pushing posteriorly resisting.   Lateral/Scoot Transfers: +2 physical assistance;+2 safety/equipment;Total assist. General Gait Details: non amb at this time.    Follow Up Recommendations  SNF     Equipment Recommendations  None recommended by PT    Recommendations for Other Services       Precautions / Restrictions Precautions Precautions: Fall Precaution Comments: Down's Syndrome, sacral/tuberosity ulcer, seat cushion Restrictions Weight Bearing Restrictions: No    Mobility  Bed Mobility Overal bed mobility: Needs Assistance Bed Mobility: Rolling;Supine to Sit     Supine to sit: Total assist;+2 for physical assistance;+2 for safety/equipment     General bed mobility comments: Pt reluctant to mobilize despite encouragement of sisters.  Increased assist required with pt pushing posteriorly resisting  Transfers Overall transfer level: Needs assistance Equipment used: None Transfers: Lateral/Scoot Transfers          Lateral/Scoot Transfers: +2 physical assistance;+2 safety/equipment;Total assist General transfer comment: pt resistant to bear weight and attempt any kind of standing transfer so utilized bed pad to perform a lateral pick up transfer 1/4 turn to recliner.  Positioned upright with multiple pillows  Ambulation/Gait             General Gait Details: non amb at  this time   Marine scientist [place] (Stroke Patients Only)       Balance                                            Cognition Arousal/Alertness: Awake/alert   Overall Cognitive Status: History of cognitive impairments - at baseline                                 General Comments: Down Syndrome, mostly non verbal      Exercises      General Comments        Pertinent Vitals/Pain Pain Assessment: Faces Faces Pain Scale: Hurts a little bit Pain Location: general with activity Pain Descriptors / Indicators: Grimacing Pain Intervention(s): Repositioned;Monitored during session    Home Living                      Prior Function            PT Goals (current goals can now be found in the care plan section)      Frequency    Min 3X/week      PT Plan Current plan remains appropriate    Co-evaluation              AM-PAC PT "6 Clicks" Mobility   Outcome Measure  Help needed turning  from your back to your side while in a flat bed without using bedrails?: Total Help needed moving from lying on your back to sitting on the side of a flat bed without using bedrails?: Total Help needed moving to and from a bed to a chair (including a wheelchair)?: Total Help needed standing up from a chair using your arms (e.g., wheelchair or bedside chair)?: Total Help needed to walk in hospital room?: Total Help needed climbing 3-5 steps with a railing? : Total 6 Click Score: 6    End of Session Equipment Utilized During Treatment: Gait belt   Patient left: in chair;with family/visitor present Nurse Communication: Mobility status;Need for lift equipment;Other (comment) PT Visit Diagnosis: Difficulty in walking, not elsewhere classified (R26.2);Muscle weakness (generalized) (M62.81)     Time: 6435-3912 PT Time Calculation (min) (ACUTE ONLY): 10 min  Charges:  $Therapeutic Activity:  8-22 mins                     Felecia Shelling  PTA Acute  Rehabilitation Services Pager      [phone] Office      [phone]

## 2020-02-11 NOTE — Progress Notes (Signed)
PROGRESS NOTE    Tracy Todd  ERX:[id number] DOB: 1965/09/13 DOA: 02/05/2020 PCP: Chesley Noon, MD    Brief Narrative:  Patient admitted to the hospital with the working diagnosis of infected decubitus ulcers.  54 year old male with past medical history of Down syndrome, nonverbal, seizures, arthritis and ambulatory dysfunction. Patient was brought to the hospital by his sister, with concerns of infected pressure wound ulcers on his hip and back. Patient has been living in a skilled nursing facility after a hospitalization for syncope. Patient has become nonambulatory,and apparently his wounds were worsening. On his initial physical examination his blood pressure was 98/60, 112/75, heart rate 56, oxygen saturation 98% he had moist mucous membranes, lungs clear to auscultation bilaterally, heart S1-S2, present rhythmic, soft abdomen, no lower extremity edema. Left lateral hip pressure ulcer, stage IV, buttock ulcer with dressing in place. Sodium 143, potassium 4.2, chloride 104, bicarb 29, glucose 102, BUN 18, creatinine 0.9. White count 5.9, hemoglobin 11.5, hematocrit 39.2, platelets 354. SARS COVID-19 negative. Pelvic CT with ulceration medially along the right buttock extending into the medial margin of the gluteus maximus muscle. No signs of osteomyelitis. Subacute superior endplate compression fracture L4.  Patient evaluated by surgery and recommended continue local wound care, no indication for debridement.  Will continue with aggressive wound care and will hold on systemic antibiotic therapy. If patient has fever or purulence will resume antibiotic therapy, currently risk vs benefit, patient will have better outcomes with local wound care than with systemic therapy. This was discussed with his sister, and she is in agreement with this plan.  Patient developed acute urinary retention, required multiple in and out bladder catheterizations before foley catheter was placed.     No patient is waiting for placement.    Assessment & Plan:   Principal Problem:   Wound infection Active Problems:   Arthritis   Down syndrome   Pressure injury of skin    1. Infected decubitus pressure ulcers (present on admission), stage3lateral hip and stage 4buttock. Continue withaggressive wound carewith good toleration.Holding systemic antibiotic therapy with good toleration.   Discontinue tramadol, continue acetaminophen and topical diclofenac for pain control. Continue with nutritional supplements.  2. Dyslipidemia/ right ICA occlusion. Onpravastatin. Continue with dual antiplatelet therapy asa and clopidogrel for 3 mo ( complete on 02/2020), then the plan is to continue aspirin alone, per neurology recommendations.   3. Down syndrome/ depression.Patient not on psychotropic medications, confirmed by his sister, care giver.Needs PASRR before transfer to SNF.  4. Iron deficiency anemia.On iron supplementation. Her Hgb and Hct have been stable.   5. New urinary retention.Foley catheter was place due to acute urinary retention.  No clinical signs of urinary tract infection.  Will remove foley catheter today for a voiding trial. Monitor urine output. Continue with finasteride.   6. Underweight, mild to moderate calorie protein malnutrition.On nutritional supplementation.On B 12, zinc, and multivitamins.    Status is: Inpatient  Remains inpatient appropriate because: Unsafe discharge  Dispo: The patient is from: Home              Anticipated d/c is to: SNF              Anticipated d/c date is: 1 day              Patient currently is medically stable to d/c.   DVT prophylaxis: Enoxaparin   Code Status:   full  Family Communication:  No family at the bedside      Nutrition  Status: Nutrition Problem: Increased nutrient needs Etiology: acute illness, wound healing Signs/Symptoms: estimated needs Interventions: Juven, MVI,  Prostat, Other (Comment) [employer])     Skin Documentation: Pressure Injury 02/05/20 Hip Left Stage 3 -  Full thickness tissue loss. Subcutaneous fat may be visible but bone, tendon or muscle are NOT exposed. pressure ulcer to left hip, slough and eschar present (Active)  02/05/20 2300  Location: Hip  Location Orientation: Left  Staging: Stage 3 -  Full thickness tissue loss. Subcutaneous fat may be visible but bone, tendon or muscle are NOT exposed.  Wound Description (Comments): pressure ulcer to left hip, slough and eschar present  Present on Admission: Yes     Pressure Injury 02/05/20 Coccyx Mid;Right Stage 4 - Full thickness tissue loss with exposed bone, tendon or muscle. wound extends from sacrum to right buttock, pink bleeding wound bed with what looks like muscle visible (Active)  02/05/20 2300  Location: Coccyx  Location Orientation: Mid;Right  Staging: Stage 4 - Full thickness tissue loss with exposed bone, tendon or muscle.  Wound Description (Comments): wound extends from sacrum to right buttock, pink bleeding wound bed with what looks like muscle visible  Present on Admission: Yes     Consultants:   Surgery and wound care.   Subjective: Patient continue to be very weak and deconditioned, no nausea or vomiting, no apparent pain. Limited communication.   Objective: Vitals:   02/10/20 1419 02/10/20 1935 02/11/20 0200 02/11/20 0452  BP: 119/63  122/70 102/70  Pulse: 79   74  Resp: 16 16 16 18   Temp:  98.6 F (37 C) 98.6 F (37 C) 98.3 F (36.8 C)  TempSrc:  Axillary Axillary   SpO2:    100%  Weight:      Height:        Intake/Output Summary (Last 24 hours) at 02/11/2020 02/13/2020 Last data filed at 02/11/2020 02/13/2020 Gross per 24 hour  Intake 1260 ml  Output 2075 ml  Net -815 ml   Filed Weights   02/05/20 2300  Weight: 45.7 kg    Examination:   General: Not in pain or dyspnea, deconditioned  Neurology: Awake and alert, non focal, non verbal  E ENT: no  pallor, no icterus, oral mucosa moist Cardiovascular: No JVD. S1-S2 present, rhythmic, no gallops, rubs, or murmurs. No lower extremity edema. Pulmonary: vesicular breath sounds bilaterally, adequate air movement, no wheezing, rhonchi or rales. Gastrointestinal. Abdomen with no organomegaly, non tender, no rebound or guarding Skin. Positive pressure ulcers left hip and sacrum with dressing in place.  Musculoskeletal: no joint deformities     Data Reviewed: I have personally reviewed following labs and imaging studies  CBC: Recent Labs  Lab 02/05/20 1140 02/06/20 0308 02/07/20 0556  WBC 5.9 5.0 4.9  NEUTROABS 4.7  --  3.5  HGB 11.5* 11.1* 11.2*  HCT 39.2 36.3* 37.5*  MCV 99.0 97.1 96.9  PLT 354 346 345   Basic Metabolic Panel: Recent Labs  Lab 02/05/20 1140 02/06/20 0308 02/07/20 0556  NA 143 140 137  K 4.2 3.7 4.5  CL 104 101 102  CO2 29 27 28   GLUCOSE 102* 89 91  BUN 18 15 16   CREATININE 0.90 0.80 0.88  CALCIUM 8.7* 8.7* 8.6*   GFR: Estimated Creatinine Clearance: 61.3 mL/min (by C-G formula based on SCr of 0.88 mg/dL). Liver Function Tests: Recent Labs  Lab 02/05/20 1140  AST 30  ALT 26  ALKPHOS 63  BILITOT 0.4  PROT 6.6  ALBUMIN  3.0*   No results for input(s): LIPASE, AMYLASE in the last 168 hours. No results for input(s): AMMONIA in the last 168 hours. Coagulation Profile: No results for input(s): INR, PROTIME in the last 168 hours. Cardiac Enzymes: No results for input(s): CKTOTAL, CKMB, CKMBINDEX, TROPONINI in the last 168 hours. BNP (last 3 results) No results for input(s): PROBNP in the last 8760 hours. HbA1C: No results for input(s): HGBA1C in the last 72 hours. CBG: No results for input(s): GLUCAP in the last 168 hours. Lipid Profile: No results for input(s): CHOL, HDL, LDLCALC, TRIG, CHOLHDL, LDLDIRECT in the last 72 hours. Thyroid Function Tests: No results for input(s): TSH, T4TOTAL, FREET4, T3FREE, THYROIDAB in the last 72  hours. Anemia Panel: No results for input(s): VITAMINB12, FOLATE, FERRITIN, TIBC, IRON, RETICCTPCT in the last 72 hours.    Radiology Studies: I have reviewed all of the imaging during this hospital visit personally     Scheduled Meds: . vitamin C  500 mg Oral Daily  . aspirin EC  81 mg Oral Daily  . Chlorhexidine Gluconate Cloth  6 each Topical Daily  . clopidogrel  75 mg Oral Daily  . collagenase   Topical Daily  . diclofenac Sodium  2 g Topical TID  . enoxaparin (LOVENOX) injection  40 mg Subcutaneous Q24H  . feeding supplement (KATE FARMS STANDARD 1.4)  325 mL Oral BID BM  . feeding supplement (PRO-STAT SUGAR FREE 64)  30 mL Oral BID  . finasteride  5 mg Oral Daily  . iron polysaccharides  150 mg Oral Daily  . lactobacillus  1 g Oral TID WC  . loratadine  10 mg Oral Daily  . multivitamin with minerals  1 tablet Oral Daily  . nutrition supplement (JUVEN)  1 packet Oral BID BM  . pravastatin  40 mg Oral q1800  . vitamin B-12  500 mcg Oral Daily  . zinc sulfate  220 mg Oral Daily   Continuous Infusions:   LOS: 6 days        Ishani Goldwasser Annett Gula, MD

## 2020-02-11 NOTE — Progress Notes (Signed)
Occupational Therapy Treatment Patient Details Name: Tracy Todd MRN: [id number] DOB: 1965-10-13 Today's Date: 02/11/2020    History of present illness 54 y.o. male with medical history significant of Down syndrome, seizures and OA bil knees presenting from SNF with sacral and L hip decubitus and inability to ambulate - per sister, pt was ambulatory when admitted to SNF in April, 21.     OT comments  Patient seated in recliner with father present when therapist entered the room. Treatment focused on improving patient's participation with activity and preparation for active movement. Patient brushed hair in reclined position with setup and verbal encouragement. Patient's leg's lowered and placed in seated position. Therapist provided mulitmodal tactics and cues to get patient to lean forward and maneuver himself in chair - in preparation for functional mobility, seated balance and ADLs. However, patient minimally participatory - complaining of pain (grimacing and rubbing site and covering his face) wherever therapist touched including his shoulder and bilateral hands. Patient's father reports he suspects this a diversionary technique instead of real pain. Therapist attempted multiple times to get patient to reach and lean forward to reduce posterior pelvic tilt and position but without success. Will continue POC for now.   Follow Up Recommendations  Other (comment) (Patient dcing to alternative setting.)    Equipment Recommendations  None recommended by OT    Recommendations for Other Services      Precautions / Restrictions Precautions Precautions: Fall Precaution Comments: Down's Syndrome, sacral/tuberosity ulcer, seat cushion Restrictions Weight Bearing Restrictions: No       Mobility Bed Mobility Overal bed mobility: Needs Assistance Bed Mobility: Rolling;Supine to Sit     Supine to sit: Total assist;+2 for physical assistance;+2 for safety/equipment     General bed mobility  comments: Pt reluctant to mobilize despite encouragement of sisters.  Increased assist required with pt pushing posteriorly resisting  Transfers Overall transfer level: Needs assistance Equipment used: None Transfers: Lateral/Scoot Transfers          Lateral/Scoot Transfers: +2 physical assistance;+2 safety/equipment;Total assist General transfer comment: pt resistant to bear weight and attempt any kind of standing transfer so utilized bed pad to perform a lateral pick up transfer 1/4 turn to recliner.  Positioned upright with multiple pillows    Balance                                           ADL either performed or assessed with clinical judgement   ADL       Grooming: Brushing hair;Set up Grooming Details (indicate cue type and reason): Patient brushed hair seated in recliner with set up and verbal encouragement.                                     Vision       Perception     Praxis      Cognition Arousal/Alertness: Awake/alert Behavior During Therapy: Anxious Overall Cognitive Status: History of cognitive impairments - at baseline                                 General Comments: Down Syndrome, mostly non verbal, and inability to comprehend therapeutic process.        Exercises     Shoulder Instructions  General Comments      Pertinent Vitals/ Pain       Pain Assessment: Faces Faces Pain Scale: Hurts a little bit Pain Location: generalized, wherever therapist touched with facial grimacing and rubbing site Pain Descriptors / Indicators: Grimacing Pain Intervention(s): Limited activity within patient's tolerance  Home Living                                          Prior Functioning/Environment              Frequency  Min 2X/week        Progress Toward Goals  OT Goals(current goals can now be found in the care plan section)     Acute Rehab OT Goals Patient Stated  Goal: improve mobility and participation OT Goal Formulation: With family Time For Goal Achievement: 02/20/20 Potential to Achieve Goals: Fair  Plan      Co-evaluation          OT goals addressed during session: ADL's and self-care;Strengthening/ROM (preparation for functional mobility)      AM-PAC OT "6 Clicks" Daily Activity     Outcome Measure   Help from another person eating meals?: A Little Help from another person taking care of personal grooming?: A Little Help from another person toileting, which includes using toliet, bedpan, or urinal?: Total Help from another person bathing (including washing, rinsing, drying)?: Total Help from another person to put on and taking off regular upper body clothing?: A Little Help from another person to put on and taking off regular lower body clothing?: Total 6 Click Score: 12    End of Session    OT Visit Diagnosis: Other abnormalities of gait and mobility (R26.89);Muscle weakness (generalized) (M62.81);Pain;Unsteadiness on feet (R26.81)   Activity Tolerance Treatment limited secondary to agitation   Patient Left in chair;with call bell/phone within reach;with chair alarm set;with family/visitor present   Nurse Communication  (okay to see patient.)        Time: 9381-0175 OT Time Calculation (min): 10 min  Charges: OT General Charges $OT Visit: 1 Visit OT Treatments $Therapeutic Activity: 8-22 mins  Waldron Session, OTR/L Acute Care Rehab Services  Office [phone] Pager: [phone]    Kelli Churn 02/11/2020, 4:00 PM

## 2020-02-12 LAB — URINALYSIS, ROUTINE W REFLEX MICROSCOPIC
Bilirubin Urine: NEGATIVE
Glucose, UA: NEGATIVE mg/dL
Ketones, ur: NEGATIVE mg/dL
Leukocytes,Ua: NEGATIVE
Nitrite: NEGATIVE
Protein, ur: NEGATIVE mg/dL
Specific Gravity, Urine: 1.015 (ref 1.005–1.030)
pH: 6 (ref 5.0–8.0)

## 2020-02-12 LAB — SARS CORONAVIRUS 2 (TAT 6-24 HRS): SARS Coronavirus 2: NEGATIVE

## 2020-02-12 LAB — CREATININE, SERUM
Creatinine, Ser: 0.87 mg/dL (ref 0.61–1.24)
GFR calc Af Amer: >60 mL/min (ref >60)
GFR calc non Af Amer: >60 mL/min (ref >60)

## 2020-02-12 MED ORDER — SODIUM CHLORIDE 0.9 % IV BOLUS
500.0000 mL | Freq: Once | INTRAVENOUS | Status: AC
  Administered 2020-02-12: 500 mL via INTRAVENOUS

## 2020-02-12 NOTE — TOC Progression Note (Signed)
Transition of Care [facility]) - Progression Note    Patient Details  Name: Tracy Todd MRN: [id number] Date of Birth: 1965/11/29  Transition of Care [facility]) CM/SW Contact  Clearance Coots, LCSW Phone Number: 02/12/2020, 12:05 PM  Clinical Narrative:    Patient sister agreeable to [facility] SNF for rehab in interim until the patient AFL is finalize.  [facility] ready to accept the patient in the am pending a covid test.  Physician notified to order.  CSW received a call from Kandyce Rud, patient IDD Coordinator through [facility]. She is actively working on AFL placement for the patient. She has requested a copy of the patient MAR. CSW provided a copy of the MAR.   Expected Discharge Plan: Skilled Nursing Facility Barriers to Discharge: Other (comment) (covid 19 test)  Expected Discharge Plan and Services Expected Discharge Plan: Skilled Nursing Facility In-house Referral: Clinical Social Work Discharge Planning Services: CM Consult   Living arrangements for the past 2 months: Skilled Nursing Facility                                       Social Determinants of Health (SDOH) Interventions    Readmission Risk Interventions No flowsheet data found.

## 2020-02-12 NOTE — Progress Notes (Signed)
PROGRESS NOTE    Tracy Todd    Code Status: Full Code  SEG:[id number] DOB: [birth year] DOA: 02/05/2020 LOS: 7 days  PCP: Chesley Noon, MD CC:  Chief Complaint  Patient presents with  . Pressure Ulcer       Hospital Summary   This is a 54 year old male with past medical history of Down syndrome, nonverbal at baseline, seizures, arthritis, ambulatory dysfunction who was brought to the hospital by his sister with concerns of infected pressure ulcers on his hip and back.  He has been in a SNF after recent hospitalization for syncope.  He has become nonambulatory and apparently his wounds were worsening. On his initial physical examination his blood pressure was 98/60, 112/75, heart rate 56, oxygen saturation 98% he had moist mucous membranes, lungs clear to auscultation bilaterally, heart S1-S2, present rhythmic, soft abdomen, no lower extremity edema. Left lateral hip pressure ulcer, stage IV, buttock ulcer with dressing in place. Sodium 143, potassium 4.2, chloride 104, bicarb 29, glucose 102, BUN 18, creatinine 0.9. White count 5.9, hemoglobin 11.5, hematocrit 39.2, platelets 354. SARS COVID-19 negative. Pelvic CT with ulceration medially along the right buttock extending into the medial margin of the gluteus maximus muscle. No signs of osteomyelitis. Subacute superior endplate compression fracture L4.  Patient evaluated by surgery and recommended continue local wound care, no indication for debridement and antibiotics were held.   A & P   Principal Problem:   Wound infection Active Problems:   Arthritis   Down syndrome   Pressure injury of skin   1. Infected decubitus pressure ulcers with stage III lateral hip and stage IV buttock ulcers, POA a. Continue aggressive wound care b. Holding systemic antibiotics unless patient becomes febrile or with purulence c. Tramadol discontinued d. Continue acetaminophen and topical diclofenac for pain control  2. Dyslipidemia, right  ICA occlusion a. Continue pravastatin b. Continue DAPT x3 months (completed on 02/2020) then continue with aspirin monotherapy  3. History of Down syndrome a. Nonverbal at baseline b. Sister is his caregiver c. He would likely benefit from palliative care consult at SNF  4. Iron deficiency anemia a. On iron supplements  5. New onset urinary retention of unknown reason a. Foley catheter was placed due to acute urinary retention b. No signs of UTI c. Continue with finasteride d. Continue bladder scans and straight cath and he is unable to void after 2 straight caths will reinsert Foley catheter prior to discharge with outpatient follow-up  6. Underweight, mild to moderate calorie protein malnutrition a. Body mass index is 17.29 kg/m.  7. Hypotension a. Improved with IV fluid bolus and p.o. intake  8. History of syncope a. 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 Previously hospitalized and discharged on 12/06/2019 after vascular surgery and neurology eval   DVT prophylaxis: enoxaparin (LOVENOX) injection 40 mg Start: 02/05/20 2200   Family Communication: Patient's sister at bedside has been updated   Disposition Plan:  Status is: Inpatient  Remains inpatient appropriate because:Medically stable for discharge, pending COVID-19 results   Dispo: The patient is from: SNF              Anticipated d/c is to: SNF              Anticipated d/c date is: 1 day              Patient currently is medically stable to d/c.          Pressure  injury documentation   Pressure Injury 02/05/20 Hip Left Stage 3 -  Full thickness tissue loss. Subcutaneous fat may be visible but bone, tendon or muscle are NOT exposed. pressure ulcer to left hip, slough and eschar present (Active)  02/05/20 2300  Location: Hip  Location Orientation: Left  Staging: Stage 3 -  Full thickness tissue loss. Subcutaneous fat may be visible but bone, tendon or  muscle are NOT exposed.  Wound Description (Comments): pressure ulcer to left hip, slough and eschar present  Present on Admission: Yes     Pressure Injury 02/05/20 Coccyx Mid;Right Stage 4 - Full thickness tissue loss with exposed bone, tendon or muscle. wound extends from sacrum to right buttock, pink bleeding wound bed with what looks like muscle visible (Active)  02/05/20 2300  Location: Coccyx  Location Orientation: Mid;Right  Staging: Stage 4 - Full thickness tissue loss with exposed bone, tendon or muscle.  Wound Description (Comments): wound extends from sacrum to right buttock, pink bleeding wound bed with what looks like muscle visible  Present on Admission: Yes    Consultants  General surgery   Procedures  None  Antibiotics   Anti-infectives (From admission, onward)   Start     Dose/Rate Route Frequency Ordered Stop   02/07/20 1400  cephALEXin (KEFLEX) capsule 500 mg  Status:  Discontinued        500 mg Oral Every 8 hours 02/07/20 1111 02/07/20 1348   02/06/20 0800  vancomycin (VANCOREADY) IVPB 500 mg/100 mL  Status:  Discontinued        500 mg 100 mL/hr over 60 Minutes Intravenous Every 12 hours 02/06/20 0431 02/07/20 1110   02/05/20 1545  vancomycin (VANCOCIN) IVPB 1000 mg/200 mL premix        1,000 mg 200 mL/hr over 60 Minutes Intravenous  Once 02/05/20 1541 02/05/20 1719        Subjective   Patient is nonverbal at baseline unable to provide history.  No overnight events.  Objective   Vitals:   02/11/20 2145 02/12/20 0453 02/12/20 0846 02/12/20 1355  BP: 123/82 (!) 91/49 (!) 96/59 116/66  Pulse: 73 60 66 80  Resp: 15 14 14    Temp: 97.7 F (36.5 C) 97.7 F (36.5 C)    TempSrc: Oral Oral    SpO2: 100% 96% 100%   Weight:      Height:        Intake/Output Summary (Last 24 hours) at 02/12/2020 1627 Last data filed at 02/12/2020 1300 Gross per 24 hour  Intake 600 ml  Output 550 ml  Net 50 ml   Filed Weights   02/05/20 2300  Weight: 45.7 kg     Examination:  Physical Exam Vitals and nursing note reviewed.  Constitutional:      General: He is not in acute distress.    Appearance: He is not ill-appearing.  HENT:     Head: Normocephalic.     Mouth/Throat:     Mouth: Mucous membranes are moist.  Eyes:     Conjunctiva/sclera: Conjunctivae normal.  Cardiovascular:     Rate and Rhythm: Normal rate and regular rhythm.  Pulmonary:     Effort: Pulmonary effort is normal.     Breath sounds: Normal breath sounds.  Abdominal:     General: There is no distension.  Musculoskeletal:     Comments: Bandages over pressure ulcers clean dry and intact  Neurological:     Mental Status: Mental status is at baseline.     Data Reviewed:  I have personally reviewed following labs and imaging studies  CBC: Recent Labs  Lab 02/06/20 0308 02/07/20 0556  WBC 5.0 4.9  NEUTROABS  --  3.5  HGB 11.1* 11.2*  HCT 36.3* 37.5*  MCV 97.1 96.9  PLT 346 345   Basic Metabolic Panel: Recent Labs  Lab 02/06/20 0308 02/07/20 0556 02/12/20 0856  NA 140 137  --   K 3.7 4.5  --   CL 101 102  --   CO2 27 28  --   GLUCOSE 89 91  --   BUN 15 16  --   CREATININE 0.80 0.88 0.87  CALCIUM 8.7* 8.6*  --    GFR: Estimated Creatinine Clearance: 62 mL/min (by C-G formula based on SCr of 0.87 mg/dL). Liver Function Tests: No results for input(s): AST, ALT, ALKPHOS, BILITOT, PROT, ALBUMIN in the last 168 hours. No results for input(s): LIPASE, AMYLASE in the last 168 hours. No results for input(s): AMMONIA in the last 168 hours. Coagulation Profile: No results for input(s): INR, PROTIME in the last 168 hours. Cardiac Enzymes: No results for input(s): CKTOTAL, CKMB, CKMBINDEX, TROPONINI in the last 168 hours. BNP (last 3 results) No results for input(s): PROBNP in the last 8760 hours. HbA1C: No results for input(s): HGBA1C in the last 72 hours. CBG: No results for input(s): GLUCAP in the last 168 hours. Lipid Profile: No results for  input(s): CHOL, HDL, LDLCALC, TRIG, CHOLHDL, LDLDIRECT in the last 72 hours. Thyroid Function Tests: No results for input(s): TSH, T4TOTAL, FREET4, T3FREE, THYROIDAB in the last 72 hours. Anemia Panel: No results for input(s): VITAMINB12, FOLATE, FERRITIN, TIBC, IRON, RETICCTPCT in the last 72 hours. Sepsis Labs: No results for input(s): PROCALCITON, LATICACIDVEN in the last 168 hours.  Recent Results (from the past 240 hour(s))  SARS Coronavirus 2 by RT PCR (hospital order, performed in [facility] hospital lab) Nasopharyngeal Nasopharyngeal Swab     Status: None   Collection Time: 02/05/20  3:55 PM   Specimen: Nasopharyngeal Swab  Result Value Ref Range Status   SARS Coronavirus 2 NEGATIVE NEGATIVE Final    Comment: (NOTE) SARS-CoV-2 target nucleic acids are NOT DETECTED.  The SARS-CoV-2 RNA is generally detectable in upper and lower respiratory specimens during the acute phase of infection. The lowest concentration of SARS-CoV-2 viral copies this assay can detect is 250 copies / mL. A negative result does not preclude SARS-CoV-2 infection and should not be used as the sole basis for treatment or other patient management decisions.  A negative result may occur with improper specimen collection / handling, submission of specimen other than nasopharyngeal swab, presence of viral mutation(s) within the areas targeted by this assay, and inadequate number of viral copies (<250 copies / mL). A negative result must be combined with clinical observations, patient history, and epidemiological information.  Fact Sheet for Patients:   BoilerBrush.com.cy  Fact Sheet for Healthcare Providers: https://pope.com/  This test is not yet approved or  cleared by the [place] FDA and has been authorized for detection and/or diagnosis of SARS-CoV-2 by FDA under an Emergency Use Authorization (EUA).  This EUA will remain in effect (meaning this  test can be used) for the duration of the COVID-19 declaration under Section 564(b)(1) of the Act, 21 U.S.C. section 360bbb-3(b)(1), unless the authorization is terminated or revoked sooner.  Performed at [facility], [address], [place], [place] [address]          Radiology Studies: No results found.  Scheduled Meds: . vitamin C  500 mg Oral Daily  . aspirin EC  81 mg Oral Daily  . Chlorhexidine Gluconate Cloth  6 each Topical Daily  . clopidogrel  75 mg Oral Daily  . collagenase   Topical Daily  . diclofenac Sodium  2 g Topical TID  . enoxaparin (LOVENOX) injection  40 mg Subcutaneous Q24H  . feeding supplement (KATE FARMS STANDARD 1.4)  325 mL Oral BID BM  . feeding supplement (PRO-STAT SUGAR FREE 64)  30 mL Oral BID  . finasteride  5 mg Oral Daily  . iron polysaccharides  150 mg Oral Daily  . lactobacillus  1 g Oral TID WC  . loratadine  10 mg Oral Daily  . multivitamin with minerals  1 tablet Oral Daily  . nutrition supplement (JUVEN)  1 packet Oral BID BM  . pravastatin  40 mg Oral q1800  . vitamin B-12  500 mcg Oral Daily  . zinc sulfate  220 mg Oral Daily   Continuous Infusions:   Time spent: 30 minutes with over 50% of the time coordinating the patient's care    Jae Dire, DO [facility] Pager [phone]  Call night coverage person covering after 7pm

## 2020-02-12 NOTE — Consult Note (Signed)
WOC Nurse Consult Note: Reason for Consult: re-check wound Wound type: patient seen last week for same Pressure Injury POA: Yes Measurement: no acute changes in measurements Wound bed:  Left trochanter; slough loosening; surgery declined CSWD Sacrum; pink 90% pink/10% fibrinous Drainage (amount, consistency, odor) minimal from the hip wound; unable to assess sacral wound contaminated with stool  Periwound: intact  Dressing procedure/placement/frequency: Continue enzymatic debridement ointment to the left hip wound daily. Top with moist dressing; this modality is very slow to clear the necrotic tissue.  Saline moist dressing to the sacral wound daily, fluff fill with saline moist gauze dressing.   Needs air mattress at the SNF; only has one good turning surface Add chair pressure redistribution pad if in chair or WC at SNF. Ordered previously    Discussed POC with patient and bedside nurse.  Re consult if needed, will not follow at this time. Thanks  Lakynn Halvorsen [employer], RN,CWOCN, CNS, CWON-AP [phone])

## 2020-02-12 NOTE — Progress Notes (Signed)
Nutrition Follow-up  RD working remotely.  DOCUMENTATION CODES:   Underweight  INTERVENTION:  - continue [employer], Juven, and 30 ml Prostat BID.  NUTRITION DIAGNOSIS:   Increased nutrient needs related to acute illness, wound healing as evidenced by estimated needs. -ongoing  GOAL:   Patient will meet greater than or equal to 90% of their needs -variably met  MONITOR:   PO intake, Supplement acceptance, Labs, Weight trends, Skin  ASSESSMENT:   54 year old male with medical history of Down syndrome--nonverbal, seizures, arthritis, and ambulatory dysfunction. Patient was brought to the hospital by his sister, with concerns of infected pressure wounds on his hip and back. Patient was hospitalized at [facility] in April and then discharged to [facility] for rehab.  Patient is a/o to self only. Per flow sheet documentation, he has been eating 10-100%, mainly 25-50%, of meals over the past 4 days. He has been accepting [employer] and prostat nearly 100% of the time offered and juven ~50% of the time offered.   He has not been weighed since admission on 6/16.   Per notes, patient is medically stable pending d/c to SNF.     Labs reviewed; Ca: 8.6 mg/dl. Medications reviewed; 500 mg ascorbic acid/day, 1 tablet multivitamin with minerals, 500 mcg oral cyanocobalamin/day, 220 mg zinc sulfate/day.    Diet Order:   Diet Order            Diet regular Room service appropriate? Yes; Fluid consistency: Thin  Diet effective now                 EDUCATION NEEDS:   Education needs have been addressed  Skin:  Skin Assessment: Skin Integrity Issues: Skin Integrity Issues:: Stage III, Stage IV Stage III: sacrum (4cm x 7cm x 0.4cm) Stage IV: L throcanter (3cm x 5cm x 0cm)  Last BM:  6/23  Height:   Ht Readings from Last 1 Encounters:  02/06/20 _0  (1.626 m)    Weight:   Wt Readings from Last 1 Encounters:  02/05/20 45.7 kg    Estimated Nutritional Needs:  Kcal:   1750-1950 kcal Protein:  95-110 grams Fluid:  >/= 2 L/day     Jarome Matin, MS, RD, LDN, CNSC Inpatient Clinical Dietitian RD pager # available in [place]  After hours/weekend pager # available in [facility]

## 2020-02-13 LAB — BASIC METABOLIC PANEL
Anion gap: 9 (ref 5–15)
BUN: 30 mg/dL — ABNORMAL HIGH (ref 6–20)
CO2: 29 mmol/L (ref 22–32)
Calcium: 8.7 mg/dL — ABNORMAL LOW (ref 8.9–10.3)
Chloride: 101 mmol/L (ref 98–111)
Creatinine, Ser: 0.75 mg/dL (ref 0.61–1.24)
GFR calc Af Amer: >60 mL/min (ref >60)
GFR calc non Af Amer: >60 mL/min (ref >60)
Glucose, Bld: 103 mg/dL — ABNORMAL HIGH (ref 70–99)
Potassium: 4.1 mmol/L (ref 3.5–5.1)
Sodium: 139 mmol/L (ref 135–145)

## 2020-02-13 LAB — CBC
HCT: 38.1 % — ABNORMAL LOW (ref 39.0–52.0)
Hemoglobin: 11.6 g/dL — ABNORMAL LOW (ref 13.0–17.0)
MCH: 29.5 pg (ref 26.0–34.0)
MCHC: 30.4 g/dL (ref 30.0–36.0)
MCV: 96.9 fL (ref 80.0–100.0)
Platelets: 388 10*3/uL (ref 150–400)
RBC: 3.93 MIL/uL — ABNORMAL LOW (ref 4.22–5.81)
RDW: 17.1 % — ABNORMAL HIGH (ref 11.5–15.5)
WBC: 7.3 10*3/uL (ref 4.0–10.5)
nRBC: 0 % (ref 0.0–0.2)

## 2020-02-13 MED ORDER — FINASTERIDE 5 MG PO TABS: 5.0000 mg | ORAL_TABLET | Freq: Every day | ORAL | 0 refills | Status: DC

## 2020-02-13 NOTE — Care Management Important Message (Signed)
Important Message  Patient Details IM Letter given to [name] SW Case Manager to present to the Patient Name: Tracy Todd MRN: [id number] Date of Birth: 1965/12/16   Medicare Important Message Given:  Yes     Caren Macadam 02/13/2020, 12:16 PM

## 2020-02-13 NOTE — Progress Notes (Signed)
The patient has been seen by his physician. The orders for discharge were written. IV was removed on day shift. Discharge instructions were reviewed with the patient and family during day shift. Patient is being discharged via [facility] with all of his belongings.

## 2020-02-13 NOTE — Discharge Summary (Signed)
Physician Discharge Summary  Tracy Todd BJS:[id number] DOB: 1965/11/29   PCP: Eartha Inch, MD  Admit date: 02/05/2020 Discharge date: 02/13/2020 Length of Stay: 8 days   Code Status: Full Code  Admitted From: SNF Discharged to:  SNF Discharge Condition:  Stable  Recommendations for Outpatient Follow-up   1. Encourage PO intake 2. Recommend palliative care consult at [facility] Summary  This is a 54 year old male with past medical history of Down syndrome, nonverbal at baseline, seizures, arthritis, ambulatory dysfunction who was brought to the hospital by his sister with concerns of infected pressure ulcers on his hip and back.  He has been in a SNF after recent hospitalization for syncope.  He has become nonambulatory and apparently his wounds were worsening. On his initial physical examination his blood pressure was 98/60, 112/75, heart rate 56, oxygen saturation 98% he had moist mucous membranes, lungs clear to auscultation bilaterally, heart S1-S2, present rhythmic, soft abdomen, no lower extremity edema. Left lateral hip pressure ulcer, stage IV, buttock ulcer with dressing in place. Sodium 143, potassium 4.2, chloride 104, bicarb 29, glucose 102, BUN 18, creatinine 0.9. White count 5.9, hemoglobin 11.5, hematocrit 39.2, platelets 354. SARS COVID-19 negative. Pelvic CT with ulceration medially along the right buttock extending into the medial margin of the gluteus maximus muscle. No signs of osteomyelitis. Subacute superior endplate compression fracture L4.  Patient was evaluated by surgery and recommended continue local wound care, no indication for debridement and antibiotics were held.  6/23, nursing noted multiple BMs throughout the day. Laxatives were discontinued and improved. Should be monitored as an outpatient.  A & P   Principal Problem:   Wound infection Active Problems:   Arthritis   Down syndrome   Pressure injury of skin    1. Infected decubitus  pressure ulcers with stage III lateral hip and stage IV buttock ulcers, POA a. Continue aggressive wound care (see DC instructions) b. Holding systemic antibiotics unless patient becomes febrile or with purulence c. Tramadol discontinued d. Continue acetaminophen and topical diclofenac for pain control  2. Dyslipidemia, right ICA occlusion a. Continue pravastatin b. Continue DAPT x3 months (completed on 02/2020) then continue with aspirin monotherapy  3. History of Down syndrome a. Nonverbal at baseline b. Sister is his caregiver c. He would likely benefit from palliative care consult at SNF  4. Iron deficiency anemia a. On iron supplements  5. New onset urinary retention of unknown reason a. Foley catheter was placed due to acute urinary retention b. No signs of UTI c. Continue with finasteride d. Had successful void trial  6. Underweight, mild to moderate calorie protein malnutrition a. Body mass index is 17.29 kg/m.  7. Hypotension a. Improved with IV fluid bolus and p.o. intake   8. History of syncope a. 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 Previously hospitalized and discharged on 12/06/2019 after vascular surgery and neurology eval    Consultants  . General surgery  Procedures  . none  Antibiotics   Anti-infectives (From admission, onward)   Start     Dose/Rate Route Frequency Ordered Stop   02/07/20 1400  cephALEXin (KEFLEX) capsule 500 mg  Status:  Discontinued        500 mg Oral Every 8 hours 02/07/20 1111 02/07/20 1348   02/06/20 0800  vancomycin (VANCOREADY) IVPB 500 mg/100 mL  Status:  Discontinued        500 mg 100 mL/hr over 60 Minutes Intravenous Every 12 hours  02/06/20 0431 02/07/20 1110   02/05/20 1545  vancomycin (VANCOCIN) IVPB 1000 mg/200 mL premix        1,000 mg 200 mL/hr over 60 Minutes Intravenous  Once 02/05/20 1541 02/05/20 1719       Subjective   Nonverbal at baseline. No  issues per nursing.   Objective   Discharge Exam: Vitals:   02/12/20 2300 02/13/20 0419  BP:  114/74  Pulse: 79 73  Resp: 15 20  Temp:  98.1 F (36.7 C)  SpO2: 98% 94%   Vitals:   02/12/20 0846 02/12/20 1355 02/12/20 2300 02/13/20 0419  BP: (!) 96/59 116/66  114/74  Pulse: 66 80 79 73  Resp: 14  15 20   Temp:    98.1 F (36.7 C)  TempSrc:      SpO2: 100%  98% 94%  Weight:      Height:        Physical Exam Vitals and nursing note reviewed.  Constitutional:      Appearance: Normal appearance.  HENT:     Head: Normocephalic and atraumatic.  Eyes:     Conjunctiva/sclera: Conjunctivae normal.  Cardiovascular:     Rate and Rhythm: Normal rate and regular rhythm.  Pulmonary:     Effort: Pulmonary effort is normal.     Breath sounds: Normal breath sounds.  Abdominal:     General: Abdomen is flat.     Palpations: Abdomen is soft.  Musculoskeletal:     Comments: Left hip with pressure ulcer with dressing over top  Skin:    Coloration: Skin is not jaundiced or pale.  Neurological:     Mental Status: He is alert. Mental status is at baseline.  Psychiatric:        Mood and Affect: Mood normal.        Behavior: Behavior normal.       The results of significant diagnostics from this hospitalization (including imaging, microbiology, ancillary and laboratory) are listed below for reference.     Microbiology: Recent Results (from the past 240 hour(s))  SARS Coronavirus 2 by RT PCR (hospital order, performed in [facility] hospital lab) Nasopharyngeal Nasopharyngeal Swab     Status: None   Collection Time: 02/05/20  3:55 PM   Specimen: Nasopharyngeal Swab  Result Value Ref Range Status   SARS Coronavirus 2 NEGATIVE NEGATIVE Final    Comment: (NOTE) SARS-CoV-2 target nucleic acids are NOT DETECTED.  The SARS-CoV-2 RNA is generally detectable in upper and lower respiratory specimens during the acute phase of infection. The lowest concentration of SARS-CoV-2 viral  copies this assay can detect is 250 copies / mL. A negative result does not preclude SARS-CoV-2 infection and should not be used as the sole basis for treatment or other patient management decisions.  A negative result may occur with improper specimen collection / handling, submission of specimen other than nasopharyngeal swab, presence of viral mutation(s) within the areas targeted by this assay, and inadequate number of viral copies (<250 copies / mL). A negative result must be combined with clinical observations, patient history, and epidemiological information.  Fact Sheet for Patients:   02/07/20  Fact Sheet for Healthcare Providers: BoilerBrush.com.cy  This test is not yet approved or  cleared by the https://pope.com/ FDA and has been authorized for detection and/or diagnosis of SARS-CoV-2 by FDA under an Emergency Use Authorization (EUA).  This EUA will remain in effect (meaning this test can be used) for the duration of the COVID-19 declaration under Section 564(b)(1)  of the Act, 21 U.S.C. section 360bbb-3(b)(1), unless the authorization is terminated or revoked sooner.  Performed at [facility], [address] [place], [place] [address]   SARS CORONAVIRUS 2 (TAT 6-24 HRS) Nasopharyngeal Nasopharyngeal Swab     Status: None   Collection Time: 02/12/20 11:31 AM   Specimen: Nasopharyngeal Swab  Result Value Ref Range Status   SARS Coronavirus 2 NEGATIVE NEGATIVE Final    Comment: (NOTE) SARS-CoV-2 target nucleic acids are NOT DETECTED.  The SARS-CoV-2 RNA is generally detectable in upper and lower respiratory specimens during the acute phase of infection. Negative results do not preclude SARS-CoV-2 infection, do not rule out co-infections with other pathogens, and should not be used as the sole basis for treatment or other patient management decisions. Negative results must be combined with clinical  observations, patient history, and epidemiological information. The expected result is Negative.  Fact Sheet for Patients: HairSlick.nohttps://www.fda.gov/media/138098/download  Fact Sheet for Healthcare Providers: quierodirigir.comhttps://www.fda.gov/media/138095/download  This test is not yet approved or cleared by the [place] FDA and  has been authorized for detection and/or diagnosis of SARS-CoV-2 by FDA under an Emergency Use Authorization (EUA). This EUA will remain  in effect (meaning this test can be used) for the duration of the COVID-19 declaration under Se ction 564(b)(1) of the Act, 21 U.S.C. section 360bbb-3(b)(1), unless the authorization is terminated or revoked sooner.  Performed at [facility], [address] [place], [place] [address]      Labs: BNP (last 3 results) No results for input(s): BNP in the last 8760 hours. Basic Metabolic Panel: Recent Labs  Lab 02/07/20 0556 02/12/20 0856 02/13/20 0241  NA 137  --  139  K 4.5  --  4.1  CL 102  --  101  CO2 28  --  29  GLUCOSE 91  --  103*  BUN 16  --  30*  CREATININE 0.88 0.87 0.75  CALCIUM 8.6*  --  8.7*   Liver Function Tests: No results for input(s): AST, ALT, ALKPHOS, BILITOT, PROT, ALBUMIN in the last 168 hours. No results for input(s): LIPASE, AMYLASE in the last 168 hours. No results for input(s): AMMONIA in the last 168 hours. CBC: Recent Labs  Lab 02/07/20 0556 02/13/20 0241  WBC 4.9 7.3  NEUTROABS 3.5  --   HGB 11.2* 11.6*  HCT 37.5* 38.1*  MCV 96.9 96.9  PLT 345 388   Cardiac Enzymes: No results for input(s): CKTOTAL, CKMB, CKMBINDEX, TROPONINI in the last 168 hours. BNP: Invalid input(s): POCBNP CBG: No results for input(s): GLUCAP in the last 168 hours. D-Dimer No results for input(s): DDIMER in the last 72 hours. Hgb A1c No results for input(s): HGBA1C in the last 72 hours. Lipid Profile No results for input(s): CHOL, HDL, LDLCALC, TRIG, CHOLHDL, LDLDIRECT in the last 72 hours. Thyroid  function studies No results for input(s): TSH, T4TOTAL, T3FREE, THYROIDAB in the last 72 hours.  Invalid input(s): FREET3 Anemia work up No results for input(s): VITAMINB12, FOLATE, FERRITIN, TIBC, IRON, RETICCTPCT in the last 72 hours. Urinalysis    Component Value Date/Time   COLORURINE YELLOW 02/12/2020 0027   APPEARANCEUR CLEAR 02/12/2020 0027   LABSPEC 1.015 02/12/2020 0027   PHURINE 6.0 02/12/2020 0027   GLUCOSEU NEGATIVE 02/12/2020 0027   HGBUR SMALL (A) 02/12/2020 0027   BILIRUBINUR NEGATIVE 02/12/2020 0027   KETONESUR NEGATIVE 02/12/2020 0027   PROTEINUR NEGATIVE 02/12/2020 0027   NITRITE NEGATIVE 02/12/2020 0027   LEUKOCYTESUR NEGATIVE 02/12/2020 0027  Sepsis Labs Invalid input(s): PROCALCITONIN,  WBC,  LACTICIDVEN Microbiology Recent Results (from the past 240 hour(s))  SARS Coronavirus 2 by RT PCR (hospital order, performed in [facility] hospital lab) Nasopharyngeal Nasopharyngeal Swab     Status: None   Collection Time: 02/05/20  3:55 PM   Specimen: Nasopharyngeal Swab  Result Value Ref Range Status   SARS Coronavirus 2 NEGATIVE NEGATIVE Final    Comment: (NOTE) SARS-CoV-2 target nucleic acids are NOT DETECTED.  The SARS-CoV-2 RNA is generally detectable in upper and lower respiratory specimens during the acute phase of infection. The lowest concentration of SARS-CoV-2 viral copies this assay can detect is 250 copies / mL. A negative result does not preclude SARS-CoV-2 infection and should not be used as the sole basis for treatment or other patient management decisions.  A negative result may occur with improper specimen collection / handling, submission of specimen other than nasopharyngeal swab, presence of viral mutation(s) within the areas targeted by this assay, and inadequate number of viral copies (<250 copies / mL). A negative result must be combined with clinical observations, patient history, and epidemiological information.  Fact Sheet for  Patients:   BoilerBrush.com.cy  Fact Sheet for Healthcare Providers: https://pope.com/  This test is not yet approved or  cleared by the [place] FDA and has been authorized for detection and/or diagnosis of SARS-CoV-2 by FDA under an Emergency Use Authorization (EUA).  This EUA will remain in effect (meaning this test can be used) for the duration of the COVID-19 declaration under Section 564(b)(1) of the Act, 21 U.S.C. section 360bbb-3(b)(1), unless the authorization is terminated or revoked sooner.  Performed at [facility], [address], [place], [place] [address]   SARS CORONAVIRUS 2 (TAT 6-24 HRS) Nasopharyngeal Nasopharyngeal Swab     Status: None   Collection Time: 02/12/20 11:31 AM   Specimen: Nasopharyngeal Swab  Result Value Ref Range Status   SARS Coronavirus 2 NEGATIVE NEGATIVE Final    Comment: (NOTE) SARS-CoV-2 target nucleic acids are NOT DETECTED.  The SARS-CoV-2 RNA is generally detectable in upper and lower respiratory specimens during the acute phase of infection. Negative results do not preclude SARS-CoV-2 infection, do not rule out co-infections with other pathogens, and should not be used as the sole basis for treatment or other patient management decisions. Negative results must be combined with clinical observations, patient history, and epidemiological information. The expected result is Negative.  Fact Sheet for Patients: HairSlick.no  Fact Sheet for Healthcare Providers: quierodirigir.com  This test is not yet approved or cleared by the [place] FDA and  has been authorized for detection and/or diagnosis of SARS-CoV-2 by FDA under an Emergency Use Authorization (EUA). This EUA will remain  in effect (meaning this test can be used) for the duration of the COVID-19 declaration under Se ction 564(b)(1) of the Act, 21  U.S.C. section 360bbb-3(b)(1), unless the authorization is terminated or revoked sooner.  Performed at [facility], [address], [place], [place] [address]     Discharge Instructions     Discharge Instructions    Diet - low sodium heart healthy   Complete by: As directed    Discharge instructions   Complete by: As directed    - hold any laxatives if having diarrhea If you have any significant change or worsening of your symptoms, do not hesitate to contact your primary care physician or return to the ED.   Discharge wound care:   Complete by: As  directed    Continue enzymatic debridement ointment to the left hip wound daily. Top with moist dressing; this modality is very slow to clear the necrotic tissue.  Saline moist dressing to the sacral wound daily, fluff fill with saline moist gauze dressing.   Needs air mattress at the SNF; only has one good turning surface Add chair pressure redistribution pad if in chair or WC at SNF. Ordered previously   Increase activity slowly   Complete by: As directed      Allergies as of 02/13/2020      Reactions   Penicillins    unknown      Medication List    STOP taking these medications   acetaminophen 500 MG tablet Commonly known as: TYLENOL   CEFTRIAXONE SODIUM IJ   CeraVe Hydrating Cleanser Liqd   diazepam 5 MG tablet Commonly known as: VALIUM   feeding supplement (PRO-STAT SUGAR FREE 64) Liqd   LACTOBACILLUS PO   loperamide 2 MG tablet Commonly known as: IMODIUM A-D   Micatin 2 % cream Generic drug: miconazole   nystatin cream Commonly known as: MYCOSTATIN   sucralfate 1 g tablet Commonly known as: CARAFATE   traMADol 50 MG tablet Commonly known as: ULTRAM   XYLOCAINE IJ     TAKE these medications   aspirin 81 MG EC tablet Take 1 tablet (81 mg total) by mouth daily.   cetirizine 10 MG tablet Commonly known as: ZYRTEC Take 10 mg by mouth daily.   clopidogrel 75 MG tablet Commonly known  as: PLAVIX Take 1 tablet (75 mg total) by mouth daily.   diclofenac Sodium 1 % Gel Commonly known as: VOLTAREN Apply 1 g topically 4 (four) times daily. To both knees   Ensure Plus Liqd Take 237 mLs by mouth 2 (two) times daily between meals.   escitalopram 5 MG tablet Commonly known as: LEXAPRO Take 5 mg by mouth daily.   FIBER ADULT GUMMIES PO Take 1 tablet by mouth in the morning and at bedtime.   finasteride 5 MG tablet Commonly known as: PROSCAR Take 1 tablet (5 mg total) by mouth daily.   iron polysaccharides 150 MG capsule Commonly known as: Nu-Iron Take 1 capsule (150 mg total) by mouth daily.   ketoconazole 2 % shampoo Commonly known as: NIZORAL Apply 1 application topically See admin instructions. Use to wash hair and face, alternate by week.   multivitamin with minerals tablet Take 1 tablet by mouth daily.   pravastatin 40 MG tablet Commonly known as: PRAVACHOL Take 40 mg by mouth daily.   selenium sulfide 1 % Lotn Commonly known as: SELSUN Apply 1 application topically See admin instructions. Use to wash hair and face alternate by week.   sodium hypochlorite 0.125 % Soln Commonly known as: DAKIN'S 1/4 STRENGTH Irrigate with 1 application as directed See admin instructions. Apply to right hip bid   vitamin B-12 500 MCG tablet Commonly known as: CYANOCOBALAMIN Take 1 tablet (500 mcg total) by mouth daily.   zinc gluconate 50 MG tablet Take 50 mg by mouth daily.            Discharge Care Instructions  (From admission, onward)         Start     Ordered   02/13/20 0000  Discharge wound care:       Comments: Continue enzymatic debridement ointment to the left hip wound daily. Top with moist dressing; this modality is very slow to clear the necrotic tissue.  Saline moist dressing  to the sacral wound daily, fluff fill with saline moist gauze dressing.   Needs air mattress at the SNF; only has one good turning surface Add chair pressure  redistribution pad if in chair or WC at SNF. Ordered previously   02/13/20 1307          Contact information for after-discharge care    Destination    [facility] Preferred SNF .   Service: Skilled Nursing Contact information: [address] [place] [address] [phone]                 Allergies  Allergen Reactions  . Penicillins     unknown   Dispo: The patient is from: SNF              Anticipated d/c is to: SNF              Anticipated d/c date is: today              Patient currently is medically stable to d/c.  Time coordinating discharge: Over 30 minutes   SIGNED:   Jae Dire, D.O. [facility] Pager: [phone]  02/13/2020, 1:07 PM

## 2020-02-13 NOTE — TOC Transition Note (Signed)
Transition of Care [facility]) - CM/SW Discharge Note   Patient Details  Name: Tracy Todd MRN: [id number] Date of Birth: 1965/12/29  [facility] CM/SW Contact:  Clearance Coots, LCSW Phone Number: 02/13/2020, 1:26 PM   Clinical Narrative:    [facility] Ready to accept the patient today.  Nurse call report to: [phone] [facility] called to transport @2 :00pm.  Patient sister notified at bedside.    Final next level of care: Skilled Nursing Facility Barriers to Discharge: Barriers Resolved   Patient Goals and CMS Choice Patient states their goals for this hospitalization and ongoing recovery are:: Pts sister does not want patient to return to [phone].  He will start with [facility] on 02/19/2020.  Pts sister hopes he's here until then or she will work with [facility] staff to find a Rehab or SNF in the interim of [facility] placement. CMS Medicare.gov Compare Post Acute Care list provided to:: Other (Comment Required) (Patients sister, Tracy Todd) Choice offered to / list presented to :  (Patients sister, Tracy Todd)  Discharge Placement PASRR number recieved: 02/12/20            Patient chooses bed at: [facility] Patient to be transferred to facility by: [facility] Name of family member notified: 02/14/20 [phone] Patient and family notified of of transfer: 02/13/20  Discharge Plan and Services In-house Referral: Clinical Social Work Discharge Planning Services: CM Consult                                 Social Determinants of Health (SDOH) Interventions     Readmission Risk Interventions No flowsheet data found.

## 2020-02-17 ENCOUNTER — Non-Acute Institutional Stay (SKILLED_NURSING_FACILITY): Payer: Medicare Other | Admitting: Internal Medicine

## 2020-02-17 ENCOUNTER — Encounter: Payer: Self-pay | Admitting: Internal Medicine

## 2020-02-17 DIAGNOSIS — L89304 Pressure ulcer of unspecified buttock, stage 4: Secondary | ICD-10-CM

## 2020-02-17 DIAGNOSIS — Z87898 Personal history of other specified conditions: Secondary | ICD-10-CM

## 2020-02-17 DIAGNOSIS — Q909 Down syndrome, unspecified: Secondary | ICD-10-CM

## 2020-02-17 DIAGNOSIS — R55 Syncope and collapse: Secondary | ICD-10-CM | POA: Diagnosis not present

## 2020-02-17 DIAGNOSIS — D509 Iron deficiency anemia, unspecified: Secondary | ICD-10-CM | POA: Diagnosis not present

## 2020-02-17 NOTE — Progress Notes (Signed)
Location:    [facility] Room Number: 108/P Place of Service:  SNF [phone]) Provider:  Leontine Locket, MD  Patient Care Team: Chesley Noon, MD as PCP - General (Family Medicine) Jerline Pain, MD as PCP - Cardiology (Cardiology)  Extended Emergency Contact Information Primary Emergency Contact: Wasco,William Address: [address]          [place]          [place], [place] [address] Johnnette Litter of [place] Phone: [phone] Mobile Phone: [phone] Relation: Father Secondary Emergency Contact: Lundon, Verdejo Mobile Phone: [phone] Relation: Sister  Code Status:  DNR Goals of care: Advanced Directive information Advanced Directives 02/17/2020  Does Patient Have a Medical Advance Directive? Yes  Type of Advance Directive Out of facility DNR (pink MOST or yellow form)  Does patient want to make changes to medical advance directive? No - Patient declined  Would patient like information on creating a medical advance directive? -     Chief Complaint  Patient presents with  . Hospitalization Follow-up    Hospitalization Follow Up   Status post hospitalization for worsening wounds  HPI:  Pt is a 54 y.o. male seen today for a hospital f/u after hospitalization for worsening wounds.  Patient has a history of Down syndrome largely nonverbal as well as history of seizure disorder arthritis ambulatory dysfunction.  He was brought to the hospital by sister with concerns of an infected pressure ulcers on his hip and back.  He was recently in skilled nursing status post hospitalization for syncope.  He was nonambulatory and there was concern his wounds were worsening.  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  There was a pelvic CT done showed ulceration along the right buttocks extending into the medial margin of the gluteal maximus muscle but no sign of osteomyelitis  Patient was evaluated by surgery  recommendation was for continued local wound care no indication for debridement or antibiotics at this point.  Of note on June 23 nursing noted multiple BMs throughout the day and laxatives were discontinued and this improved.  Currently he is lying in bed comfortably he is not speaking to me but per nursing he will speak at times.  Wound care will be evaluating his wounds.  He has been afebrile vital signs appear to be stable.    Past Medical History:  Diagnosis Date  . Arthritis   . Down's syndrome   . Seizures ([place])    No past surgical history on file.  Allergies  Allergen Reactions  . Penicillins     unknown    Allergies as of 02/17/2020      Reactions   Penicillins    unknown      Medication List       Accurate as of February 17, 2020  2:04 PM. If you have any questions, ask your nurse or doctor.        STOP taking these medications   sodium hypochlorite 0.125 % Soln Commonly known as: DAKIN'S 1/4 STRENGTH Stopped by: Granville Lewis, PA-C     TAKE these medications   aspirin 81 MG EC tablet Take 1 tablet (81 mg total) by mouth daily.   bisacodyl 10 MG suppository Commonly known as: DULCOLAX If not relieved by MOM, give 10 mg Bisacodyl suppositiory rectally X 1 dose in 24 hours as needed (Do not use constipation standing orders for residents with renal failure/CFR less  than 30. Contact MD for orders) (Physician Order)   cetirizine 10 MG tablet Commonly known as: ZYRTEC Take 10 mg by mouth daily.   clopidogrel 75 MG tablet Commonly known as: PLAVIX Take 1 tablet (75 mg total) by mouth daily.   diclofenac Sodium 1 % Gel Commonly known as: VOLTAREN Apply 1 g topically 4 (four) times daily. To both knees   Ensure Plus Liqd Take 237 mLs by mouth 2 (two) times daily between meals.   nutrition supplement (JUVEN) Pack Take 1 packet by mouth 2 (two) times daily between meals. To aid in wound healing.   escitalopram 5 MG tablet Commonly known as:  LEXAPRO Take 5 mg by mouth daily.   feeding supplement (PRO-STAT SUGAR FREE 64) Liqd Take 30 mLs by mouth 2 (two) times daily. To aid in wound healing; resident prefers Prostat mixed in water.   FIBER ADULT GUMMIES PO Take 1 tablet by mouth in the morning and at bedtime.   finasteride 5 MG tablet Commonly known as: PROSCAR Take 1 tablet (5 mg total) by mouth daily.   iron polysaccharides 150 MG capsule Commonly known as: Nu-Iron Take 1 capsule (150 mg total) by mouth daily.   ketoconazole 2 % shampoo Commonly known as: NIZORAL Apply 1 application topically See admin instructions. Use to wash hair and face, alternate by week.   magnesium hydroxide 400 MG/5ML suspension Commonly known as: MILK OF MAGNESIA If no BM in 3 days, give 30 cc Milk of Magnesium p.o. x 1 dose in 24 hours as needed (Do not use standing constipation orders for residents with renal failure CFR less than 30. Contact MD for orders) (Physician Order)   multivitamin with minerals tablet Take 1 tablet by mouth daily.   NON FORMULARY Regular liberalized diet d/t Dx Malnutrition.   pravastatin 40 MG tablet Commonly known as: PRAVACHOL Take 40 mg by mouth daily.   RA SALINE ENEMA RE If not relieved by Biscodyl suppository, give disposable Saline Enema rectally X 1 dose/24 hrs as needed (Do not use constipation standing orders for residents with renal failure/CFR less than 30. Contact MD for orders)(Physician Or   vitamin B-12 500 MCG tablet Commonly known as: CYANOCOBALAMIN Take 1 tablet (500 mcg total) by mouth daily.   zinc gluconate 50 MG tablet Take 50 mg by mouth daily.       Review of Systems  Was unobtainable secondary to patient being largely nonverbal.  Nursing does not report any acute issues  Immunization History  Administered Date(s) Administered  . Influenza, High Dose Seasonal PF 05/23/2019  . Influenza-Unspecified 07/07/2011  . Moderna SARS-COVID-2 Vaccination 09/02/2019, 09/30/2019    Pertinent  Health Maintenance Due  Topic Date Due  . COLONOSCOPY  Never done  . INFLUENZA VACCINE  03/22/2020   No flowsheet data found. Functional Status Survey:    Vitals:   02/17/20 1401  BP: 120/71  Pulse: 78  Resp: 18  Temp: 98 F (36.7 C)  TempSrc: Oral  SpO2: 97%  Weight: 104 lb (47.2 kg)  Height: 5\' 4"  (1.626 m)   Body mass index is 17.85 kg/m. Physical Exam In general this is a somewhat frail-appearing middle-aged male he does not appear to be in any distress but is not really speaking to me-he is alert-he does make eye contact.  His skin is warm and dry wounds are currently covered including left hip-.  Eyes visual acuity appears to be intact sclera and conjunctive are clear.  Oropharynx appeared to be clear although  this was limited since he did not really follow verbal commands.  Chest was clear to auscultation with poor respiratory effort there was no labored breathing.  Heart was regular rate and rhythm with an occasional irregular beat.  He did not have significant lower extremity edema.  Abdomen was soft nontender with positive bowel sounds.  Musculoskeletal did not really follow verbal commands does move his upper extremities at baseline--- holds his lower extremities in somewhat of a semicontracted position there is no edema.  Neurologic he is alert he does make eye contact cannot really appreciate true lateralizing findings but complete assessment was difficult secondary patient not following verbal commands.  Psych he appeared to be oriented to self-was not agitated with exam but did not really follow verbal commands he was alert.  He did not really speak to me but per nursing at times he will speak   Labs reviewed: Recent Labs    09/16/19 0816 09/17/19 0228 02/06/20 0308 02/06/20 0308 02/07/20 0556 02/12/20 0856 02/13/20 0241  NA 141   < > 140  --  137  --  139  K 4.2   < > 3.7  --  4.5  --  4.1  CL 106   < > 101  --  102  --  101   CO2 25   < > 27  --  28  --  29  GLUCOSE 108*   < > 89  --  91  --  103*  BUN 22*   < > 15  --  16  --  30*  CREATININE 1.29*   < > 0.80   < > 0.88 0.87 0.75  CALCIUM 8.6*   < > 8.7*  --  8.6*  --  8.7*  MG 1.9  --   --   --   --   --   --    < > = values in this interval not displayed.   Recent Labs    09/16/19 0816 02/05/20 1140  AST 25 30  ALT 18 26  ALKPHOS 52 63  BILITOT 0.5 0.4  PROT 6.2* 6.6  ALBUMIN 3.3* 3.0*   Recent Labs    11/29/19 1048 11/30/19 0639 02/05/20 1140 02/05/20 1140 02/06/20 0308 02/07/20 0556 02/13/20 0241  WBC 7.9   < > 5.9   < > 5.0 4.9 7.3  NEUTROABS 6.8  --  4.7  --   --  3.5  --   HGB 12.6*   < > 11.5*   < > 11.1* 11.2* 11.6*  HCT 40.8   < > 39.2   < > 36.3* 37.5* 38.1*  MCV 101.0*   < > 99.0   < > 97.1 96.9 96.9  PLT 255   < > 354   < > 346 345 388   < > = values in this interval not displayed.   Lab Results  Component Value Date   TSH 1.821 11/29/2019   Lab Results  Component Value Date   HGBA1C 5.3 12/02/2019   Lab Results  Component Value Date   CHOL 133 12/02/2019   HDL 42 12/02/2019   LDLCALC 77 12/02/2019   TRIG 69 12/02/2019   CHOLHDL 3.2 12/02/2019    Significant Diagnostic Results in last 30 days:  DG Sacrum/Coccyx  Result Date: 02/05/2020 CLINICAL DATA:  Open sacral decubitus wound. EXAM: SACRUM AND COCCYX - 2+ VIEW COMPARISON:  None. FINDINGS: Possible irregular lucency or lytic area is seen involving the posterior portion of  the iliac bone on 1 of the sides, although it cannot be determined exactly since it is a lateral projection. Potentially this may represent osteomyelitis. IMPRESSION: Possible irregular lucency or lytic area seen involving the posterior portion of the iliac bone on 1 side, although it cannot be determined exactly which side since it is a lateral projection. CT scan is recommended for further evaluation. Electronically Signed   By: Lupita Raider M.D.   On: 02/05/2020 13:33   CT PELVIS WO  CONTRAST  Result Date: 02/05/2020 CLINICAL DATA:  Pressure ulcer along the left buttock, possible osteomyelitis EXAM: CT PELVIS WITHOUT CONTRAST TECHNIQUE: Multidetector CT imaging of the pelvis was performed following the standard protocol without intravenous contrast. COMPARISON:  Sacrum radiographs from 02/05/2020 FINDINGS: The patient was unable to comply with positioning and would only lay on his left side with the hips flexed. Images were obtained with a distorted imaging plane which could not be corrected due to cooperation issues. Urinary Tract:  Unremarkable Bowel: Air fluid levels in the distal colon suggesting diarrheal process. Vascular/Lymphatic: Unremarkable Reproductive:  Unremarkable Other:  Please see below regarding right upper buttock ulcer. Musculoskeletal: Ulceration medially along the right buttock extending into the medial margin of the gluteus maximus muscle, image 88/4. Surrounding cutaneous thickening and subcutaneous stranding. Although the region of ulceration is close to the lower sacral segment and coccyx, I do not observe characteristic bony destructive findings in this vicinity typical for osteomyelitis. There is a subacute superior endplate compression fracture at L4 with about 20% loss of vertebral body heights and a small fracture of the anterior superior corner as shown on image 46/8. 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 There is also some sclerosis and subsidence of the endplates at the L5-S1 level. The area of lucency of concern on prior radiography appears to correspond to the tip of the spinous process of L5, which does not demonstrate a significant lesion on CT. IMPRESSION: 1. Ulceration medially along the right buttock extending into the medial margin of the gluteus maximus muscle. Although the region of ulceration is close to the lower sacral segment and coccyx, I  do not observe characteristic bony destructive findings in this vicinity typical for osteomyelitis. 2. Subacute superior endplate compression fracture at L4 with about 20% loss of vertebral body heights and a small fracture of the anterior superior corner. No significant bony retropulsion at this time. 3. 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 4. Air fluid levels in the distal colon suggesting diarrheal process. Electronically Signed   By: Gaylyn Rong M.D.   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  Wound care is following this closely-wound care physician assistant also will be following.  Antibiotics were held with suggestion to reevaluate if patient becomes febrile or has purulence or there are deterioration of the wound.  Does have Tylenol and topical Voltaren gel for pain control-tramadol was discontinued in the hospital.  2.  History of dyslipidemia with right ICA occlusion recommendation was to continue pravastatin and continue aspirin and Plavix for 3 months which will be completed on March 06, 2020.  Then continue on aspirin alone.  Of note LDL in April was 25.  3.  History of Down syndrome he is largely nonverbal but  per nursing he does speak some at times.  There is suggestion for palliative care.  4.  History of iron deficiency anemia he continues on iron supplementation-hemoglobin was 11.6 in the hospital will monitor this and update a lab later this week.  5.  History of urinary retention of unknown reason.  At one point he had a Foley-at this point continue to monitor voiding he is on Proscar 5 mg a day.  He did have a successful voiding trial in the hospital.  6.  History of hypotension this responded to IV fluids-blood pressure today appears to be in the 114-120s range  systolically at this point will monitor.  7.  History of syncope this was thought possibly related to chronic occlusion of the right ICA-with potential vasovagal effect.  During a previous hospitalization he was evaluated by vascular surgery as well as neurology.  8.  History of mild to moderate protein calorie malnutrition-this will be evaluated by dietary he is on supplements.  9.  History of allergic rhinitis continues on Zyrtec 10 mg a day at this point will monitor.  10.  History of depression continues on Lexapro 5 mg a day.   #11 history of seizures-I do not see that he is on any seizure medication-at this point will monitor  [id number]-of note greater than 35 minutes spent assessing patient-reviewing his chart and labs-discussing his status with nursing staff-and coordinating formulating a plan of care-of note greater than 50% of time spent coordinating a plan of care with input as noted above

## 2020-02-18 ENCOUNTER — Encounter: Payer: Self-pay | Admitting: Internal Medicine

## 2020-02-18 ENCOUNTER — Non-Acute Institutional Stay (SKILLED_NURSING_FACILITY): Payer: Medicare Other | Admitting: Internal Medicine

## 2020-02-18 ENCOUNTER — Other Ambulatory Visit: Payer: Self-pay | Admitting: Internal Medicine

## 2020-02-18 DIAGNOSIS — Z87898 Personal history of other specified conditions: Secondary | ICD-10-CM | POA: Diagnosis not present

## 2020-02-18 DIAGNOSIS — D509 Iron deficiency anemia, unspecified: Secondary | ICD-10-CM | POA: Diagnosis not present

## 2020-02-18 DIAGNOSIS — L89304 Pressure ulcer of unspecified buttock, stage 4: Secondary | ICD-10-CM

## 2020-02-18 DIAGNOSIS — R55 Syncope and collapse: Secondary | ICD-10-CM

## 2020-02-18 DIAGNOSIS — E538 Deficiency of other specified B group vitamins: Secondary | ICD-10-CM

## 2020-02-18 LAB — CBC: RBC: 3.73 — AB (ref 3.87–5.11)

## 2020-02-18 LAB — CBC AND DIFFERENTIAL
HCT: 35 — AB (ref 41–53)
Hemoglobin: 11 — AB (ref 13.5–17.5)
WBC: 8

## 2020-02-18 LAB — COMPREHENSIVE METABOLIC PANEL
Calcium: 8.6 — AB (ref 8.7–10.7)
GFR calc Af Amer: >90
GFR calc non Af Amer: >90

## 2020-02-18 LAB — BASIC METABOLIC PANEL
BUN: 26 — AB (ref 4–21)
CO2: 24 — AB (ref 13–22)
Chloride: 102 (ref 99–108)
Creatinine: 0.7 (ref 0.6–1.3)
Glucose: 149
Potassium: 3.8 (ref 3.4–5.3)
Sodium: 140 (ref 137–147)

## 2020-02-18 MED ORDER — TRAMADOL HCL 50 MG PO TABS: 25.0000 mg | ORAL_TABLET | Freq: Every day | ORAL | 0 refills | Status: AC

## 2020-02-18 NOTE — Progress Notes (Signed)
Provider:  Einar Crow MD Location:   [facility] Room Number: 108P Place of Service:  SNF [phone])  PCP: Eartha Inch, MD Patient Care Team: Eartha Inch, MD as PCP - General (Family Medicine) Jake Bathe, MD as PCP - Cardiology (Cardiology)  Extended Emergency Contact Information Primary Emergency Contact: Wethington,William Address: [address]          [address]          [place], [place] [address] [place] of [place] Home Phone: [phone] Mobile Phone: [phone] Relation: Father Secondary Emergency Contact: Karol, Liendo Mobile Phone: [phone] Relation: Sister  Code Status: DNR Goals of Care: Advanced Directive information Advanced Directives 02/17/2020  Does Patient Have a Medical Advance Directive? Yes  Type of Advance Directive Out of facility DNR (pink MOST or yellow form)  Does patient want to make changes to medical advance directive? No - Patient declined  Would patient like information on creating a medical advance directive? -      Chief Complaint  Patient presents with  . New Admit To SNF    Admission    HPI: Patient is a 54 y.o. male seen today for admission to SNF for Wound Care  Was admitted in the hospital from 6/16-6/24 for Infected Pressure Ulcers  Patient has past medical history of Down syndrome and is aphasic, seizures.  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  Was evaluated by neurology and vascular surgery and was recommended to continue aspirin and Plavix for 3 months and then aspirin.  He also had EEG done at that time which was negative for seizures.  He was taken to  ED by his sister for concern of infected pressure ulcers on his Left Hip and Bottom.  Per his Family  He has been  nonambulatory.  And was living in SNF.[facility]  He was found to have stage 3 buttock and Left hip ulcer.  CT scan was  negative for any osteomyelitis He was seen by surgery and recommended conservative management.  No antibiotics unless needed.  Patient is unable to give any history but was Moaning in Pain when Nurse doing the Dressing Change.No Fever. No Discharge Per Nurses he is Wheelchair dependent Not Ambulating Eats by homself  Past Medical History:  Diagnosis Date  . Arthritis   . Down's syndrome   . Seizures (HCC)    History reviewed. No pertinent surgical history.  reports that he has never smoked. He has never used smokeless tobacco. He reports that he does not drink alcohol and does not use drugs. Social History   Socioeconomic History  . Marital status: Single    Spouse name: Not on file  . Number of children: Not on file  . Years of education: Not on file  . Highest education level: Not on file  Occupational History  . Not on file  Tobacco Use  . Smoking status: Never Smoker  . Smokeless tobacco: Never Used  Vaping Use  . Vaping Use: Never used  Substance and Sexual Activity  . Alcohol use: Never  . Drug use: Never  . Sexual activity: Not on file  Other Topics Concern  . Not on file  Social History Narrative  . Not on file   Social Determinants of Health   Financial Resource Strain:   . Difficulty of Paying Living Expenses:  Food Insecurity:   . Worried About Programme researcher, broadcasting/film/video in the Last Year:   . Barista in the Last Year:   Transportation Needs:   . Freight forwarder (Medical):   Marland Kitchen Lack of Transportation (Non-Medical):   Physical Activity:   . Days of Exercise per Week:   . Minutes of Exercise per Session:   Stress:   . Feeling of Stress :   Social Connections:   . Frequency of Communication with Friends and Family:   . Frequency of Social Gatherings with Friends and Family:   . Attends Religious Services:   . Active Member of Clubs or Organizations:   . Attends Banker Meetings:   Marland Kitchen Marital Status:   Intimate Partner Violence:     . Fear of Current or Ex-Partner:   . Emotionally Abused:   Marland Kitchen Physically Abused:   . Sexually Abused:     Functional Status Survey:    History reviewed. No pertinent family history.  Health Maintenance  Topic Date Due  . Hepatitis C Screening  Never done  . TETANUS/TDAP  Never done  . COLONOSCOPY  Never done  . INFLUENZA VACCINE  03/22/2020  . COVID-19 Vaccine  Completed  . HIV Screening  Completed    Allergies  Allergen Reactions  . Penicillins     unknown    Allergies as of 02/18/2020      Reactions   Penicillins    unknown      Medication List       Accurate as of February 18, 2020 10:01 AM. If you have any questions, ask your nurse or doctor.        aspirin 81 MG EC tablet Take 1 tablet (81 mg total) by mouth daily.   bisacodyl 10 MG suppository Commonly known as: DULCOLAX If not relieved by MOM, give 10 mg Bisacodyl suppositiory rectally X 1 dose in 24 hours as needed (Do not use constipation standing orders for residents with renal failure/CFR less than 30. Contact MD for orders) (Physician Order)   cetirizine 10 MG tablet Commonly known as: ZYRTEC Take 10 mg by mouth daily.   clopidogrel 75 MG tablet Commonly known as: PLAVIX Take 1 tablet (75 mg total) by mouth daily.   diclofenac Sodium 1 % Gel Commonly known as: VOLTAREN Apply 1 g topically 4 (four) times daily. To both knees   Ensure Plus Liqd Take 237 mLs by mouth 2 (two) times daily between meals.   nutrition supplement (JUVEN) Pack Take 1 packet by mouth 2 (two) times daily between meals. To aid in wound healing.   escitalopram 5 MG tablet Commonly known as: LEXAPRO Take 5 mg by mouth daily.   feeding supplement (PRO-STAT SUGAR FREE 64) Liqd Take 30 mLs by mouth 2 (two) times daily. To aid in wound healing; resident prefers Prostat mixed in water.   FIBER ADULT GUMMIES PO Take 1 tablet by mouth in the morning and at bedtime.   finasteride 5 MG tablet Commonly known as:  PROSCAR Take 1 tablet (5 mg total) by mouth daily.   iron polysaccharides 150 MG capsule Commonly known as: Nu-Iron Take 1 capsule (150 mg total) by mouth daily.   ketoconazole 2 % shampoo Commonly known as: NIZORAL Apply 1 application topically See admin instructions. Use to wash hair and face, alternate by week.   magnesium hydroxide 400 MG/5ML suspension Commonly known as: MILK OF MAGNESIA If no BM in 3 days, give 30 cc Milk  of Magnesium p.o. x 1 dose in 24 hours as needed (Do not use standing constipation orders for residents with renal failure CFR less than 30. Contact MD for orders) (Physician Order)   multivitamin with minerals tablet Take 1 tablet by mouth daily.   NON FORMULARY Regular liberalized diet d/t Dx Malnutrition.   pravastatin 40 MG tablet Commonly known as: PRAVACHOL Take 40 mg by mouth daily.   RA SALINE ENEMA RE If not relieved by Biscodyl suppository, give disposable Saline Enema rectally X 1 dose/24 hrs as needed (Do not use constipation standing orders for residents with renal failure/CFR less than 30. Contact MD for orders)(Physician Or   vitamin B-12 500 MCG tablet Commonly known as: CYANOCOBALAMIN Take 1 tablet (500 mcg total) by mouth daily.   zinc gluconate 50 MG tablet Take 50 mg by mouth daily.       Review of Systems  Unable to perform ROS: Other    Vitals:   02/18/20 0952  BP: 103/73  Pulse: 71  Resp: 18  Temp: 97.7 F (36.5 C)  Weight: 104 lb (47.2 kg)  Height: 5\' 4"  (1.626 m)   Body mass index is 17.85 kg/m. Physical Exam Vitals reviewed.  Constitutional:      Appearance: Normal appearance.  HENT:     Head: Normocephalic.     Nose: Nose normal.     Mouth/Throat:     Mouth: Mucous membranes are moist.     Pharynx: Oropharynx is clear.  Eyes:     Pupils: Pupils are equal, round, and reactive to light.  Cardiovascular:     Rate and Rhythm: Normal rate and regular rhythm.     Pulses: Normal pulses.  Pulmonary:      Effort: Pulmonary effort is normal.     Breath sounds: Normal breath sounds.  Abdominal:     General: Abdomen is flat. Bowel sounds are normal.     Palpations: Abdomen is soft.  Musculoskeletal:        General: No swelling.     Cervical back: Neck supple.  Skin:    Comments: Has 2 wounds Stage 3 in Bottom and Left Hip Both were debrided by Wound care Nurse. Base is mostly dry with Clear Margins  Neurological:     General: No focal deficit present.     Mental Status: He is alert.  Psychiatric:        Mood and Affect: Mood normal.        Thought Content: Thought content normal.     Labs reviewed: Basic Metabolic Panel: Recent Labs    09/16/19 0816 09/17/19 0228 02/06/20 0308 02/06/20 0308 02/07/20 0556 02/12/20 0856 02/13/20 0241  NA 141   < > 140  --  137  --  139  K 4.2   < > 3.7  --  4.5  --  4.1  CL 106   < > 101  --  102  --  101  CO2 25   < > 27  --  28  --  29  GLUCOSE 108*   < > 89  --  91  --  103*  BUN 22*   < > 15  --  16  --  30*  CREATININE 1.29*   < > 0.80   < > 0.88 0.87 0.75  CALCIUM 8.6*   < > 8.7*  --  8.6*  --  8.7*  MG 1.9  --   --   --   --   --   --    < > =  values in this interval not displayed.   Liver Function Tests: Recent Labs    09/16/19 0816 02/05/20 1140  AST 25 30  ALT 18 26  ALKPHOS 52 63  BILITOT 0.5 0.4  PROT 6.2* 6.6  ALBUMIN 3.3* 3.0*   No results for input(s): LIPASE, AMYLASE in the last 8760 hours. No results for input(s): AMMONIA in the last 8760 hours. CBC: Recent Labs    11/29/19 1048 11/30/19 0639 02/05/20 1140 02/05/20 1140 02/06/20 0308 02/07/20 0556 02/13/20 0241  WBC 7.9   < > 5.9   < > 5.0 4.9 7.3  NEUTROABS 6.8  --  4.7  --   --  3.5  --   HGB 12.6*   < > 11.5*   < > 11.1* 11.2* 11.6*  HCT 40.8   < > 39.2   < > 36.3* 37.5* 38.1*  MCV 101.0*   < > 99.0   < > 97.1 96.9 96.9  PLT 255   < > 354   < > 346 345 388   < > = values in this interval not displayed.   Cardiac Enzymes: No results for  input(s): CKTOTAL, CKMB, CKMBINDEX, TROPONINI in the last 8760 hours. BNP: Invalid input(s): POCBNP Lab Results  Component Value Date   HGBA1C 5.3 12/02/2019   Lab Results  Component Value Date   TSH 1.821 11/29/2019   Lab Results  Component Value Date   VITAMINB12 140 (L) 09/16/2019   No results found for: FOLATE Lab Results  Component Value Date   FERRITIN 6 (L) 09/16/2019    Imaging and Procedures obtained prior to SNF admission: DG Sacrum/Coccyx  Result Date: 02/05/2020 CLINICAL DATA:  Open sacral decubitus wound. EXAM: SACRUM AND COCCYX - 2+ VIEW COMPARISON:  None. FINDINGS: Possible irregular lucency or lytic area is seen involving the posterior portion of the iliac bone on 1 of the sides, although it cannot be determined exactly since it is a lateral projection. Potentially this may represent osteomyelitis. IMPRESSION: Possible irregular lucency or lytic area seen involving the posterior portion of the iliac bone on 1 side, although it cannot be determined exactly which side since it is a lateral projection. CT scan is recommended for further evaluation. Electronically Signed   By: Lupita Raider M.D.   On: 02/05/2020 13:33   CT PELVIS WO CONTRAST  Result Date: 02/05/2020 CLINICAL DATA:  Pressure ulcer along the left buttock, possible osteomyelitis EXAM: CT PELVIS WITHOUT CONTRAST TECHNIQUE: Multidetector CT imaging of the pelvis was performed following the standard protocol without intravenous contrast. COMPARISON:  Sacrum radiographs from 02/05/2020 FINDINGS: The patient was unable to comply with positioning and would only lay on his left side with the hips flexed. Images were obtained with a distorted imaging plane which could not be corrected due to cooperation issues. Urinary Tract:  Unremarkable Bowel: Air fluid levels in the distal colon suggesting diarrheal process. Vascular/Lymphatic: Unremarkable Reproductive:  Unremarkable Other:  Please see below regarding right upper  buttock ulcer. Musculoskeletal: Ulceration medially along the right buttock extending into the medial margin of the gluteus maximus muscle, image 88/4. Surrounding cutaneous thickening and subcutaneous stranding. Although the region of ulceration is close to the lower sacral segment and coccyx, I do not observe characteristic bony destructive findings in this vicinity typical for osteomyelitis. There is a subacute superior endplate compression fracture at L4 with about 20% loss of vertebral body heights and a small fracture of the anterior superior corner as shown on image 46/8. 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 There is also some sclerosis and subsidence of the endplates at the L5-S1 level. The area of lucency of concern on prior radiography appears to correspond to the tip of the spinous process of L5, which does not demonstrate a significant lesion on CT. IMPRESSION: 1. Ulceration medially along the right buttock extending into the medial margin of the gluteus maximus muscle. Although the region of ulceration is close to the lower sacral segment and coccyx, I do not observe characteristic bony destructive findings in this vicinity typical for osteomyelitis. 2. Subacute superior endplate compression fracture at L4 with about 20% loss of vertebral body heights and a small fracture of the anterior superior corner. No significant bony retropulsion at this time. 3. 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 4. Air fluid levels in the distal colon suggesting diarrheal process. Electronically Signed   By: Gaylyn Rong M.D.   On: 02/05/2020 15:35    Assessment/Plan  Pressure injury of buttock, and Left Hip stage 3 Left Side Santyl for both Wounds with Skin Prep Cover with Allevyn Start him on Tramadol 25 mg  Before his dressings Air Mattress. Keep Pressure off  Syncope, unspecified syncope type Work up was negative in 4/21 Thought to be due to Right ICA Occlusion Dual therapy per Neurology To be switched to Aspirin in 7/21 Continue Statin Iron deficiency anemia, unspecified iron deficiency anemia type Hgb Stable on Iron Vitamin B 12 deficiency Continue B12 supplement H/o Urinary Retention Continue Finasteride Down Syndrome Continue Supportive care   Family/ staff Communication:   Labs/tests ordered: Total time spent in this patient care encounter was  45_  minutes; greater than 50% of the visit spent counseling  staff, reviewing records , Labs and coordinating care for problems addressed at this encounter.

## 2020-03-02 ENCOUNTER — Encounter: Payer: Self-pay | Admitting: Family

## 2020-03-02 ENCOUNTER — Non-Acute Institutional Stay (SKILLED_NURSING_FACILITY): Payer: Medicare Other | Admitting: Family

## 2020-03-02 DIAGNOSIS — D509 Iron deficiency anemia, unspecified: Secondary | ICD-10-CM

## 2020-03-02 DIAGNOSIS — E781 Pure hyperglyceridemia: Secondary | ICD-10-CM | POA: Diagnosis not present

## 2020-03-02 DIAGNOSIS — E538 Deficiency of other specified B group vitamins: Secondary | ICD-10-CM | POA: Diagnosis not present

## 2020-03-02 DIAGNOSIS — Z87898 Personal history of other specified conditions: Secondary | ICD-10-CM | POA: Diagnosis not present

## 2020-03-02 DIAGNOSIS — G40409 Other generalized epilepsy and epileptic syndromes, not intractable, without status epilepticus: Secondary | ICD-10-CM

## 2020-03-02 DIAGNOSIS — J302 Other seasonal allergic rhinitis: Secondary | ICD-10-CM

## 2020-03-02 DIAGNOSIS — M170 Bilateral primary osteoarthritis of knee: Secondary | ICD-10-CM

## 2020-03-02 DIAGNOSIS — Q909 Down syndrome, unspecified: Secondary | ICD-10-CM

## 2020-03-02 DIAGNOSIS — L89304 Pressure ulcer of unspecified buttock, stage 4: Secondary | ICD-10-CM

## 2020-03-02 DIAGNOSIS — R269 Unspecified abnormalities of gait and mobility: Secondary | ICD-10-CM

## 2020-03-02 DIAGNOSIS — L219 Seborrheic dermatitis, unspecified: Secondary | ICD-10-CM

## 2020-03-02 NOTE — Progress Notes (Deleted)
Location:    [facility].   Nursing Home Room Number: 108-P Place of Service:  SNF (31)  Provider: Richarda BladeNgetich, Gidget Quizhpi, NP  PCP: Eartha InchBadger, Michael C, MD Patient Care Team: Eartha InchBadger, Michael C, MD as PCP - General (Family Medicine) Jake BatheSkains, Mark C, MD as PCP - Cardiology (Cardiology)  Extended Emergency Contact Information Primary Emergency Contact: Patti,William Address: [address]          [address]          [place], [place] [address] [place] Home Phone: [phone] Mobile Phone: [phone] Relation: Father Secondary Emergency Contact: Carollee Leitzller, Lisa Mobile Phone: [phone] Relation: Sister  Code Status: Full Code  Goals of care:  Advanced Directive information Advanced Directives 03/02/2020  Does Patient Have a Medical Advance Directive? No  Type of Advance Directive -  Does patient want to make changes to medical advance directive? No - Patient declined  Would patient like information on creating a medical advance directive? -     Allergies  Allergen Reactions  . Penicillins     unknown    Chief Complaint  Patient presents with  . Discharge Note    Discharge from SNF    HPI:  54 y.o. male  Seen today     Past Medical History:  Diagnosis Date  . Arthritis   . Down's syndrome   . Seizures (HCC)     No past surgical history on file.    reports that he has never smoked. He has never used smokeless tobacco. He reports that he does not drink alcohol and does not use drugs. Social History   Socioeconomic History  . Marital status: Single    Spouse name: Not on file  . Number of children: Not on file  . Years of education: Not on file  . Highest education level: Not on file  Occupational History  . Not on file  Tobacco Use  . Smoking status: Never Smoker  . Smokeless tobacco: Never Used  Vaping Use  . Vaping Use: Never used  Substance and Sexual Activity  . Alcohol use: Never  . Drug use: Never  . Sexual activity: Not  on file  Other Topics Concern  . Not on file  Social History Narrative  . Not on file   Social Determinants of Health   Financial Resource Strain:   . Difficulty of Paying Living Expenses:   Food Insecurity:   . Worried About Programme researcher, broadcasting/film/videounning Out of Food in the Last Year:   . Baristaan Out of Food in the Last Year:   Transportation Needs:   . Freight forwarderLack of Transportation (Medical):   Marland Kitchen. Lack of Transportation (Non-Medical):   Physical Activity:   . Days of Exercise per Week:   . Minutes of Exercise per Session:   Stress:   . Feeling of Stress :   Social Connections:   . Frequency of Communication with Friends and Family:   . Frequency of Social Gatherings with Friends and Family:   . Attends Religious Services:   . Active Member of Clubs or Organizations:   . Attends BankerClub or Organization Meetings:   Marland Kitchen. Marital Status:   Intimate Partner Violence:   . Fear of Current or Ex-Partner:   . Emotionally Abused:   Marland Kitchen. Physically Abused:   . Sexually Abused:    Functional Status Survey:    Allergies  Allergen Reactions  . Penicillins     unknown    Pertinent  Health Maintenance Due  Topic Date Due  . COLONOSCOPY  Never done  . INFLUENZA VACCINE  03/22/2020    Medications: Allergies as of 03/02/2020      Reactions   Penicillins    unknown      Medication List       Accurate as of March 02, 2020 12:22 PM. If you have any questions, ask your nurse or doctor.        STOP taking these medications   clopidogrel 75 MG tablet Commonly known as: PLAVIX Stopped by: Caesar Bookman, NP     TAKE these medications   aspirin 81 MG EC tablet Take 1 tablet (81 mg total) by mouth daily.   bisacodyl 10 MG suppository Commonly known as: DULCOLAX If not relieved by MOM, give 10 mg Bisacodyl suppositiory rectally X 1 dose in 24 hours as needed (Do not use constipation standing orders for residents with renal failure/CFR less than 30. Contact MD for orders) (Physician Order)   cetirizine 10 MG  tablet Commonly known as: ZYRTEC Take 10 mg by mouth daily.   diclofenac Sodium 1 % Gel Commonly known as: VOLTAREN Apply 1 g topically 4 (four) times daily. To both knees   Ensure Plus Liqd Take 237 mLs by mouth 2 (two) times daily between meals.   nutrition supplement (JUVEN) Pack Take 1 packet by mouth 2 (two) times daily between meals. To aid in wound healing.   escitalopram 5 MG tablet Commonly known as: LEXAPRO Take 5 mg by mouth daily.   feeding supplement (PRO-STAT SUGAR FREE 64) Liqd Take 30 mLs by mouth 2 (two) times daily. To aid in wound healing; resident prefers Prostat mixed in water.   FIBER ADULT GUMMIES PO Take 1 tablet by mouth in the morning and at bedtime.   finasteride 5 MG tablet Commonly known as: PROSCAR Take 1 tablet (5 mg total) by mouth daily.   iron polysaccharides 150 MG capsule Commonly known as: Nu-Iron Take 1 capsule (150 mg total) by mouth daily.   ketoconazole 2 % shampoo Commonly known as: NIZORAL Apply 1 application topically See admin instructions. Use to wash hair and face, alternate by week.   magnesium hydroxide 400 MG/5ML suspension Commonly known as: MILK OF MAGNESIA If no BM in 3 days, give 30 cc Milk of Magnesium p.o. x 1 dose in 24 hours as needed (Do not use standing constipation orders for residents with renal failure CFR less than 30. Contact MD for orders) (Physician Order)   multivitamin with minerals tablet Take 1 tablet by mouth daily.   NON FORMULARY Regular liberalized diet d/t Dx Malnutrition.   pravastatin 40 MG tablet Commonly known as: PRAVACHOL Take 40 mg by mouth daily.   RA SALINE ENEMA RE If not relieved by Biscodyl suppository, give disposable Saline Enema rectally X 1 dose/24 hrs as needed (Do not use constipation standing orders for residents with renal failure/CFR less than 30. Contact MD for orders)(Physician Or   selenium sulfide 1 % Lotn Commonly known as: SELSUN Apply 1 application topically  daily.   traMADol 50 MG tablet Commonly known as: ULTRAM Take 0.5 tablets (25 mg total) by mouth daily for 14 days.   vitamin B-12 500 MCG tablet Commonly known as: CYANOCOBALAMIN Take 1 tablet (500 mcg total) by mouth daily.   zinc gluconate 50 MG tablet Take 50 mg by mouth daily.       Review of Systems  Vitals:   03/02/20 1214  BP: 108/70  Pulse: 69  Resp:  18  Temp: 97.7 F (36.5 C)  SpO2: 97%  Weight: 104 lb (47.2 kg)  Height: 5\' 4"  (1.626 m)   Body mass index is 17.85 kg/m. Physical Exam  Labs reviewed: Basic Metabolic Panel: Recent Labs    09/16/19 0816 09/17/19 0228 02/06/20 0308 02/06/20 0308 02/07/20 0556 02/07/20 0556 02/12/20 0856 02/13/20 0241 02/18/20 0000  NA 141   < > 140   < > 137  --   --  139 140  K 4.2   < > 3.7   < > 4.5  --   --  4.1 3.8  CL 106   < > 101   < > 102  --   --  101 102  CO2 25   < > 27   < > 28  --   --  29 24*  GLUCOSE 108*   < > 89  --  91  --   --  103*  --   BUN 22*   < > 15   < > 16  --   --  30* 26*  CREATININE 1.29*   < > 0.80   < > 0.88   < > 0.87 0.75 0.7  CALCIUM 8.6*   < > 8.7*   < > 8.6*  --   --  8.7* 8.6*  MG 1.9  --   --   --   --   --   --   --   --    < > = values in this interval not displayed.   Liver Function Tests: Recent Labs    09/16/19 0816 02/05/20 1140  AST 25 30  ALT 18 26  ALKPHOS 52 63  BILITOT 0.5 0.4  PROT 6.2* 6.6  ALBUMIN 3.3* 3.0*   No results for input(s): LIPASE, AMYLASE in the last 8760 hours. No results for input(s): AMMONIA in the last 8760 hours. CBC: Recent Labs    11/29/19 1048 11/30/19 0639 02/05/20 1140 02/05/20 1140 02/06/20 0308 02/06/20 0308 02/07/20 0556 02/13/20 0241 02/18/20 0000  WBC 7.9   < > 5.9   < > 5.0   < > 4.9 7.3 8.0  NEUTROABS 6.8  --  4.7  --   --   --  3.5  --   --   HGB 12.6*   < > 11.5*   < > 11.1*   < > 11.2* 11.6* 11.0*  HCT 40.8   < > 39.2   < > 36.3*   < > 37.5* 38.1* 35*  MCV 101.0*   < > 99.0   < > 97.1  --  96.9 96.9  --     PLT 255   < > 354   < > 346  --  345 388  --    < > = values in this interval not displayed.   Cardiac Enzymes: No results for input(s): CKTOTAL, CKMB, CKMBINDEX, TROPONINI in the last 8760 hours. BNP: Invalid input(s): POCBNP CBG: Recent Labs    09/16/19 0911  GLUCAP 84    Procedures and Imaging Studies During Stay: DG Sacrum/Coccyx  Result Date: 02/05/2020 CLINICAL DATA:  Open sacral decubitus wound. EXAM: SACRUM AND COCCYX - 2+ VIEW COMPARISON:  None. FINDINGS: Possible irregular lucency or lytic area is seen involving the posterior portion of the iliac bone on 1 of the sides, although it cannot be determined exactly since it is a lateral projection. Potentially this may represent osteomyelitis. IMPRESSION: Possible irregular lucency or lytic area  seen involving the posterior portion of the iliac bone on 1 side, although it cannot be determined exactly which side since it is a lateral projection. CT scan is recommended for further evaluation. Electronically Signed   By: Lupita Raider M.D.   On: 02/05/2020 13:33   CT PELVIS WO CONTRAST  Result Date: 02/05/2020 CLINICAL DATA:  Pressure ulcer along the left buttock, possible osteomyelitis EXAM: CT PELVIS WITHOUT CONTRAST TECHNIQUE: Multidetector CT imaging of the pelvis was performed following the standard protocol without intravenous contrast. COMPARISON:  Sacrum radiographs from 02/05/2020 FINDINGS: The patient was unable to comply with positioning and would only lay on his left side with the hips flexed. Images were obtained with a distorted imaging plane which could not be corrected due to cooperation issues. Urinary Tract:  Unremarkable Bowel: Air fluid levels in the distal colon suggesting diarrheal process. Vascular/Lymphatic: Unremarkable Reproductive:  Unremarkable Other:  Please see below regarding right upper buttock ulcer. Musculoskeletal: Ulceration medially along the right buttock extending into the medial margin of the gluteus  maximus muscle, image 88/4. Surrounding cutaneous thickening and subcutaneous stranding. Although the region of ulceration is close to the lower sacral segment and coccyx, I do not observe characteristic bony destructive findings in this vicinity typical for osteomyelitis. There is a subacute superior endplate compression fracture at L4 with about 20% loss of vertebral body heights and a small fracture of the anterior superior corner as shown on image 46/8. 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 There is also some sclerosis and subsidence of the endplates at the L5-S1 level. The area of lucency of concern on prior radiography appears to correspond to the tip of the spinous process of L5, which does not demonstrate a significant lesion on CT. IMPRESSION: 1. Ulceration medially along the right buttock extending into the medial margin of the gluteus maximus muscle. Although the region of ulceration is close to the lower sacral segment and coccyx, I do not observe characteristic bony destructive findings in this vicinity typical for osteomyelitis. 2. Subacute superior endplate compression fracture at L4 with about 20% loss of vertebral body heights and a small fracture of the anterior superior corner. No significant bony retropulsion at this time. 3. 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 4. Air fluid levels in the distal colon suggesting diarrheal process. Electronically Signed   By: Gaylyn Rong M.D.   On: 02/05/2020 15:35    Assessment/Plan:   There are no diagnoses linked to this encounter.   Patient is being discharged with the following home health services:    Patient is being discharged with the following durable medical equipment:    Patient has been advised to f/u with their PCP in 1-2 weeks to  bring them up to date on their rehab stay.  Social services at facility was responsible for arranging this appointment.  Pt was provided with a 30 day supply of prescriptions for medications and refills must be obtained from their PCP.  For controlled substances, a more limited supply may be provided adequate until PCP appointment only.  Future labs/tests needed:

## 2020-03-03 ENCOUNTER — Other Ambulatory Visit: Payer: Self-pay | Admitting: Internal Medicine

## 2020-03-03 DIAGNOSIS — G40409 Other generalized epilepsy and epileptic syndromes, not intractable, without status epilepticus: Secondary | ICD-10-CM

## 2020-03-03 MED ORDER — SELENIUM SULFIDE 1 % EX LOTN: 1.0000 "application " | TOPICAL_LOTION | Freq: Every day | CUTANEOUS | 0 refills | Status: DC

## 2020-03-03 MED ORDER — ESCITALOPRAM OXALATE 5 MG PO TABS: 5.0000 mg | ORAL_TABLET | Freq: Every day | ORAL | 0 refills | Status: DC

## 2020-03-03 MED ORDER — KETOCONAZOLE 2 % EX SHAM: 1.0000 "application " | MEDICATED_SHAMPOO | CUTANEOUS | 0 refills | Status: AC

## 2020-03-03 MED ORDER — PRAVASTATIN SODIUM 40 MG PO TABS: 40.0000 mg | ORAL_TABLET | Freq: Every day | ORAL | 0 refills | Status: DC

## 2020-03-03 MED ORDER — FINASTERIDE 5 MG PO TABS: 5.0000 mg | ORAL_TABLET | Freq: Every day | ORAL | 0 refills | Status: AC

## 2020-03-03 MED ORDER — LEVETIRACETAM 500 MG PO TABS: 500.0000 mg | ORAL_TABLET | Freq: Two times a day (BID) | ORAL | 0 refills | Status: DC

## 2020-03-03 MED ORDER — DICLOFENAC SODIUM 1 % EX GEL: 2.0000 g | Freq: Four times a day (QID) | CUTANEOUS | 0 refills | Status: AC

## 2020-03-03 MED ORDER — CYANOCOBALAMIN 500 MCG PO TABS: 500.0000 ug | ORAL_TABLET | Freq: Every day | ORAL | 0 refills | Status: AC

## 2020-03-03 MED ORDER — POLYSACCHARIDE IRON COMPLEX 150 MG PO CAPS: 150.0000 mg | ORAL_CAPSULE | Freq: Every day | ORAL | 0 refills | Status: AC

## 2020-03-03 MED ORDER — ZINC GLUCONATE 50 MG PO TABS: 50.0000 mg | ORAL_TABLET | Freq: Every day | ORAL | 0 refills | Status: AC

## 2020-03-03 MED ORDER — LEVETIRACETAM 500 MG PO TABS: 500.0000 mg | ORAL_TABLET | Freq: Two times a day (BID) | ORAL | 0 refills | Status: AC

## 2020-03-03 NOTE — Progress Notes (Signed)
[facility] Room Number: 108-P Place of Service:  SNF [phone])  Provider: Richarda Blade FNP-C   PCP: Eartha Inch, MD Patient Care Team: Eartha Inch, MD as PCP - General (Family Medicine) Jake Bathe, MD as PCP - Cardiology (Cardiology)  Extended Emergency Contact Information Primary Emergency Contact: Hipps,William Address: [address]          [address]          [place], [place] [address] [place] Home Phone: [phone] Mobile Phone: [phone] Relation: Father Secondary Emergency Contact: Adam, Sanjuan Mobile Phone: [phone] Relation: Sister  Code Status: Full Code  Goals of care:  Advanced Directive information Advanced Directives 03/02/2020  Does Patient Have a Medical Advance Directive? No  Type of Advance Directive -  Does patient want to make changes to medical advance directive? No - Patient declined  Would patient like information on creating a medical advance directive? -     Allergies  Allergen Reactions  . Penicillins     unknown    Chief Complaint  Patient presents with  . Discharge Note    Discharge from SNF    HPI:  54 y.o. male seen today at [facility] for discharge from skilled Nursing facility to [facility] [place]. He was here for short term rehabilitation for post hospital admission 02/05/2020 - 02/13/2020 for infected left hip and buttock  pressure ulcers.CT scan was negative for for osteomyelitis.He was seen by surgery who recommended conservative management.No antibiotic needed.He was previously living in [facility] has a medical history of Down syndrome,seizures,Aphasia,recurrent syncope  among other conditions.He has worked well with PT/OT now stable for discharge to [facility] in [place] with care giver.He will be discharged with [facility] health PT/OT to continue with ROM, Exercise, Gait stability and muscle  strengthening. He will also be discharged with [facility] Nurse for wound care management.He will require  DME standard WC with Cushion, anti tippers, extended brake handles, removable elevating leg rests to enable him to maintain current level of independence with ADL's which cannot be achieved with walker or cane.He will also require a semi Electric Hospital bed  with  rails to allow to be repositioned in ways not feasible with a normal bed due to left hip and gluteal Pressure ulcer.Home health services will be arranged by facility social worker prior to discharge.Prescription medication will be written x 1 month then patient to follow up with PCP in 1-2 weeks.He denies any acute issues this visit though HPI limited patient answers to yes or No question.He requires one person assist with his ADL's.He feeds himself with set up of tray.Facility staff report no new concerns.  Past Medical History:  Diagnosis Date  . Arthritis   . Down's syndrome   . Seizures (HCC)     No past surgical history on file.    reports that he has never smoked. He has never used smokeless tobacco. He reports that he does not drink alcohol and does not use drugs. Social History   Socioeconomic History  . Marital status: Single    Spouse name: Not on file  . Number of children: Not on file  . Years of education: Not on file  . Highest education level: Not on file  Occupational History  . Not on file  Tobacco Use  . Smoking status: Never Smoker  . Smokeless tobacco: Never Used  Vaping Use  .  Vaping Use: Never used  Substance and Sexual Activity  . Alcohol use: Never  . Drug use: Never  . Sexual activity: Not on file  Other Topics Concern  . Not on file  Social History Narrative  . Not on file   Social Determinants of Health   Financial Resource Strain:   . Difficulty of Paying Living Expenses:   Food Insecurity:   . Worried About Programme researcher, broadcasting/film/video in the Last Year:   . Barista in the Last Year:     Transportation Needs:   . Freight forwarder (Medical):   Marland Kitchen Lack of Transportation (Non-Medical):   Physical Activity:   . Days of Exercise per Week:   . Minutes of Exercise per Session:   Stress:   . Feeling of Stress :   Social Connections:   . Frequency of Communication with Friends and Family:   . Frequency of Social Gatherings with Friends and Family:   . Attends Religious Services:   . Active Member of Clubs or Organizations:   . Attends Banker Meetings:   Marland Kitchen Marital Status:   Intimate Partner Violence:   . Fear of Current or Ex-Partner:   . Emotionally Abused:   Marland Kitchen Physically Abused:   . Sexually Abused:     Allergies  Allergen Reactions  . Penicillins     unknown    Pertinent  Health Maintenance Due  Topic Date Due  . COLONOSCOPY  Never done  . INFLUENZA VACCINE  03/22/2020    Medications: Outpatient Encounter Medications as of 03/02/2020  Medication Sig  . Amino Acids-Protein Hydrolys (FEEDING SUPPLEMENT, PRO-STAT SUGAR FREE 64,) LIQD Take 30 mLs by mouth 2 (two) times daily. To aid in wound healing; resident prefers Prostat mixed in water.  Marland Kitchen aspirin EC 81 MG EC tablet Take 1 tablet (81 mg total) by mouth daily.  . bisacodyl (DULCOLAX) 10 MG suppository If not relieved by MOM, give 10 mg Bisacodyl suppositiory rectally X 1 dose in 24 hours as needed (Do not use constipation standing orders for residents with renal failure/CFR less than 30. Contact MD for orders) (Physician Order)  . cetirizine (ZYRTEC) 10 MG tablet Take 10 mg by mouth daily.  . diclofenac Sodium (VOLTAREN) 1 % GEL Apply 1 g topically 4 (four) times daily. To both knees  . Ensure Plus (ENSURE PLUS) LIQD Take 237 mLs by mouth 2 (two) times daily between meals.  Marland Kitchen escitalopram (LEXAPRO) 5 MG tablet Take 5 mg by mouth daily.  Marland Kitchen FIBER ADULT GUMMIES PO Take 1 tablet by mouth in the morning and at bedtime.   . finasteride (PROSCAR) 5 MG tablet Take 1 tablet (5 mg total) by mouth daily.   . iron polysaccharides (NU-IRON) 150 MG capsule Take 1 capsule (150 mg total) by mouth daily.  Marland Kitchen ketoconazole (NIZORAL) 2 % shampoo Apply 1 application topically See admin instructions. Use to wash hair and face, alternate by week.  . magnesium hydroxide (MILK OF MAGNESIA) 400 MG/5ML suspension If no BM in 3 days, give 30 cc Milk of Magnesium p.o. x 1 dose in 24 hours as needed (Do not use standing constipation orders for residents with renal failure CFR less than 30. Contact MD for orders) (Physician Order)  . Multiple Vitamins-Minerals (MULTIVITAMIN WITH MINERALS) tablet Take 1 tablet by mouth daily.  . NON FORMULARY Regular liberalized diet d/t Dx Malnutrition.  . nutrition supplement, JUVEN, (JUVEN) PACK Take 1 packet by mouth 2 (two) times daily  between meals. To aid in wound healing.  . pravastatin (PRAVACHOL) 40 MG tablet Take 40 mg by mouth daily.  Marland Kitchen. selenium sulfide (SELSUN) 1 % LOTN Apply 1 application topically daily.  . Sodium Phosphates (RA SALINE ENEMA RE) If not relieved by Biscodyl suppository, give disposable Saline Enema rectally X 1 dose/24 hrs as needed (Do not use constipation standing orders for residents with renal failure/CFR less than 30. Contact MD for orders)(Physician Or  . traMADol (ULTRAM) 50 MG tablet Take 0.5 tablets (25 mg total) by mouth daily for 14 days.  . vitamin B-12 (CYANOCOBALAMIN) 500 MCG tablet Take 1 tablet (500 mcg total) by mouth daily.  Marland Kitchen. zinc gluconate 50 MG tablet Take 50 mg by mouth daily.  . [DISCONTINUED] clopidogrel (PLAVIX) 75 MG tablet Take 1 tablet (75 mg total) by mouth daily.   No facility-administered encounter medications on file as of 03/02/2020.     Review of Systems  Unable to perform ROS: Other (Additional HPI information provided by facility Nurse patient answers yes or No to question )  Constitutional: Negative for appetite change, chills, fatigue and fever.  HENT: Negative for congestion, postnasal drip, rhinorrhea, sinus  pressure, sinus pain, sneezing, sore throat and trouble swallowing.   Respiratory: Negative for cough, chest tightness, shortness of breath and wheezing.   Cardiovascular: Negative for chest pain, palpitations and leg swelling.  Gastrointestinal: Negative for abdominal distention, abdominal pain, constipation, diarrhea, nausea and vomiting.  Endocrine: Negative for cold intolerance, heat intolerance, polydipsia, polyphagia and polyuria.  Genitourinary: Negative for difficulty urinating.  Musculoskeletal: Positive for gait problem. Negative for joint swelling and myalgias.  Skin: Positive for wound. Negative for color change, pallor and rash.  Neurological: Negative for dizziness, speech difficulty, weakness, light-headedness, numbness and headaches.  Hematological: Does not bruise/bleed easily.  Psychiatric/Behavioral: Negative for agitation, hallucinations and sleep disturbance. The patient is not nervous/anxious.     Vitals:   03/02/20 1214  BP: 108/70  Pulse: 69  Resp: 18  Temp: 97.7 F (36.5 C)  SpO2: 97%  Weight: 104 lb (47.2 kg)  Height: 5\' 4"  (1.626 m)   Body mass index is 17.85 kg/m.  Physical Exam Vitals reviewed.  Constitutional:      General: He is not in acute distress.    Appearance: He is underweight. He is not ill-appearing.  HENT:     Head: Normocephalic.     Mouth/Throat:     Mouth: Mucous membranes are moist.     Pharynx: Oropharynx is clear. No oropharyngeal exudate or posterior oropharyngeal erythema.  Eyes:     General: No scleral icterus.       Right eye: No discharge.        Left eye: No discharge.     Extraocular Movements: Extraocular movements intact.     Conjunctiva/sclera: Conjunctivae normal.     Pupils: Pupils are equal, round, and reactive to light.  Cardiovascular:     Rate and Rhythm: Normal rate and regular rhythm.     Pulses: Normal pulses.     Heart sounds: Normal heart sounds. No murmur heard.  No friction rub. No gallop.     Pulmonary:     Effort: Pulmonary effort is normal. No respiratory distress.     Breath sounds: Normal breath sounds. No wheezing, rhonchi or rales.  Chest:     Chest wall: No tenderness.  Abdominal:     General: Bowel sounds are normal. There is no distension.     Palpations: Abdomen is soft. There  is no mass.     Tenderness: There is no abdominal tenderness. There is no right CVA tenderness, left CVA tenderness, guarding or rebound.  Musculoskeletal:        General: No swelling or tenderness.     Right lower leg: No edema.     Left lower leg: No edema.     Comments: Wheelchair bound  Skin:    General: Skin is warm and dry.     Coloration: Skin is not pale.     Findings: No bruising, erythema or rash.     Comments: Left hip wound and gluteal not visualized this visit dressing changed by wound care provider and facility wound Nurse prior to visit.   Neurological:     Mental Status: He is alert.     Cranial Nerves: No cranial nerve deficit.     Motor: No weakness.     Gait: Gait abnormal.     Comments: Alert and oriented to self and familiar staff   Psychiatric:        Mood and Affect: Mood normal.        Behavior: Behavior normal.        Thought Content: Thought content normal.        Judgment: Judgment normal.    Labs reviewed: Basic Metabolic Panel: Recent Labs    09/16/19 0816 09/17/19 0228 02/06/20 0308 02/06/20 0308 02/07/20 0556 02/07/20 0556 02/12/20 0856 02/13/20 0241 02/18/20 0000  NA 141   < > 140   < > 137  --   --  139 140  K 4.2   < > 3.7   < > 4.5  --   --  4.1 3.8  CL 106   < > 101   < > 102  --   --  101 102  CO2 25   < > 27   < > 28  --   --  29 24*  GLUCOSE 108*   < > 89  --  91  --   --  103*  --   BUN 22*   < > 15   < > 16  --   --  30* 26*  CREATININE 1.29*   < > 0.80   < > 0.88   < > 0.87 0.75 0.7  CALCIUM 8.6*   < > 8.7*   < > 8.6*  --   --  8.7* 8.6*  MG 1.9  --   --   --   --   --   --   --   --    < > = values in this interval not  displayed.   Liver Function Tests: Recent Labs    09/16/19 0816 02/05/20 1140  AST 25 30  ALT 18 26  ALKPHOS 52 63  BILITOT 0.5 0.4  PROT 6.2* 6.6  ALBUMIN 3.3* 3.0*   CBC: Recent Labs    11/29/19 1048 11/30/19 0639 02/05/20 1140 02/05/20 1140 02/06/20 0308 02/06/20 0308 02/07/20 0556 02/13/20 0241 02/18/20 0000  WBC 7.9   < > 5.9   < > 5.0   < > 4.9 7.3 8.0  NEUTROABS 6.8  --  4.7  --   --   --  3.5  --   --   HGB 12.6*   < > 11.5*   < > 11.1*   < > 11.2* 11.6* 11.0*  HCT 40.8   < > 39.2   < > 36.3*   < > 37.5*  38.1* 35*  MCV 101.0*   < > 99.0   < > 97.1  --  96.9 96.9  --   PLT 255   < > 354   < > 346  --  345 388  --    < > = values in this interval not displayed.   CBG: Recent Labs    09/16/19 0911  GLUCAP 84    Procedures and Imaging Studies During Stay: DG Sacrum/Coccyx  Result Date: 02/05/2020 CLINICAL DATA:  Open sacral decubitus wound. EXAM: SACRUM AND COCCYX - 2+ VIEW COMPARISON:  None. FINDINGS: Possible irregular lucency or lytic area is seen involving the posterior portion of the iliac bone on 1 of the sides, although it cannot be determined exactly since it is a lateral projection. Potentially this may represent osteomyelitis. IMPRESSION: Possible irregular lucency or lytic area seen involving the posterior portion of the iliac bone on 1 side, although it cannot be determined exactly which side since it is a lateral projection. CT scan is recommended for further evaluation. Electronically Signed   By: Lupita Raider M.D.   On: 02/05/2020 13:33   CT PELVIS WO CONTRAST  Result Date: 02/05/2020 CLINICAL DATA:  Pressure ulcer along the left buttock, possible osteomyelitis EXAM: CT PELVIS WITHOUT CONTRAST TECHNIQUE: Multidetector CT imaging of the pelvis was performed following the standard protocol without intravenous contrast. COMPARISON:  Sacrum radiographs from 02/05/2020 FINDINGS: The patient was unable to comply with positioning and would only lay on  his left side with the hips flexed. Images were obtained with a distorted imaging plane which could not be corrected due to cooperation issues. Urinary Tract:  Unremarkable Bowel: Air fluid levels in the distal colon suggesting diarrheal process. Vascular/Lymphatic: Unremarkable Reproductive:  Unremarkable Other:  Please see below regarding right upper buttock ulcer. Musculoskeletal: Ulceration medially along the right buttock extending into the medial margin of the gluteus maximus muscle, image 88/4. Surrounding cutaneous thickening and subcutaneous stranding. Although the region of ulceration is close to the lower sacral segment and coccyx, I do not observe characteristic bony destructive findings in this vicinity typical for osteomyelitis. There is a subacute superior endplate compression fracture at L4 with about 20% loss of vertebral body heights and a small fracture of the anterior superior corner as shown on image 46/8. 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 There is also some sclerosis and subsidence of the endplates at the L5-S1 level. The area of lucency of concern on prior radiography appears to correspond to the tip of the spinous process of L5, which does not demonstrate a significant lesion on CT. IMPRESSION: 1. Ulceration medially along the right buttock extending into the medial margin of the gluteus maximus muscle. Although the region of ulceration is close to the lower sacral segment and coccyx, I do not observe characteristic bony destructive findings in this vicinity typical for osteomyelitis. 2. Subacute superior endplate compression fracture at L4 with about 20% loss of vertebral body heights and a small fracture of the anterior superior corner. No significant bony retropulsion at this time. 3. Chronic pars defects at L5 with 1.1 cm of degenerative anterolisthesis L5 on S1 causing  considerable bilateral foraminal impingement. 4. Air fluid levels in the distal colon suggesting diarrheal process. Electronically Signed   By: Gaylyn Rong M.D.   On: 02/05/2020 15:35    Assessment/Plan:    1. Pressure injury of buttock, stage 4, unspecified  laterality [facility]) Status post hospitalization from 02/05/2020 - 02/13/2020 for infected left hip and buttock pressure ulcers.CT scan was negative for for osteomyelitis. - [facility] for wound care management.   - semi Electric Hospital bed  with  rails to allow to be repositioned in ways not feasible with a normal bed due to left hip and gluteal Pressure ulcer - continue on Ensure supplement 237 ml twice daily between meals, Juven one packet twice daily between meals and Prostat 30 ml by mouth daily to promote wound healing.  - continue on Zinc 50 mg tablet daily  - continue Tramadol 25 mg tablet daily for pain - CBC, BMP in 1-2 weeks with PCP  - zinc gluconate 50 MG tablet; Take 1 tablet (50 mg total) by mouth daily.  Dispense: 30 tablet; Refill: 0  2. Pure hypertriglyceridemia Latest LDL reviewed at goal. Continue on Pravastatin 40 mg tablet daily - pravastatin (PRAVACHOL) 40 MG tablet; Take 1 tablet (40 mg total) by mouth daily.  Dispense: 30 tablet; Refill: 0  3. Vitamin B 12 deficiency Continue on Vitamin B12 500 mcg tablet daily. - vitamin B-12 (CYANOCOBALAMIN) 500 MCG tablet; Take 1 tablet (500 mcg total) by mouth daily.  Dispense: 30 tablet; Refill: 0  4. History of seizures No recent seizures episode reported by staff. Continue on LevETIRAcetam 500 mg tablet twice daily.  - levETIRAcetam (KEPPRA) 500 MG tablet; Take 1 tablet (500 mg total) by mouth 2 (two) times daily.  Dispense: 60 tablet; Refill: 0  5. Down syndrome Continue with supportive care. Continue on escitalopram 5 mg tablet daily. - escitalopram (LEXAPRO) 5 MG tablet; Take 1 tablet (5 mg total) by mouth daily.  Dispense: 30 tablet; Refill: 0  6.  Abnormal gait Wheelchair bound. - DME standard WC with Cushion, anti tippers, extended brake handles, removable elevating leg rests to enable him to maintain current level of independence with ADL's which cannot be achieved with walker or cane.  - [facility] PT/OT for ROM, exercise, gait stability and muscle strengthening  7. Iron deficiency anemia, unspecified iron deficiency anemia type - latest Hgb stable. - continue on Nu-iron 150 mg capsule daily  -  CBC in 1-2 weeks with PCP - iron polysaccharides (NU-IRON) 150 MG capsule; Take 1 capsule (150 mg total) by mouth daily.  Dispense: 30 capsule; Refill: 0  8. Seasonal allergies - continue on cetirizine 10 mg tablet daily.  9. Primary osteoarthritis of both knees Continue with current pain regimen.  - diclofenac Sodium (VOLTAREN) 1 % GEL; Apply 2 g topically 4 (four) times daily. To both knees  Dispense: 50 g; Refill: 0  11. Seborrheic dermatitis Continue with topical shampoo - selenium sulfide (SELSUN) 1 % LOTN; Apply 1 application topically daily.  Dispense: 207 mL; Refill: 0 - ketoconazole (NIZORAL) 2 % shampoo; Apply 1 application topically See admin instructions. Use to wash hair and face, alternate by week.  Dispense: 120 mL; Refill: 0   Patient is being discharged with the following home health services:   - [facility] PT/OT for ROM, exercise, gait stability and muscle strengthening  -  HH RN for wound care management   Patient is being discharged with the following durable medical equipment:    -  standard WC with Cushion, anti tippers, extended brake handles, removable elevating leg rests  to enable him to maintain current level of independence with ADL's which cannot be achieved with walker or cane.    -  A semi Electric  Hospital bed  with  rails to allow to be repositioned in ways not feasible with a normal bed due to left hip and gluteal Pressure ulcer.  Patient has been advised to f/u with their PCP in 1-2  weeks to for a transitions of care visit.Social services at their facility was responsible for arranging this appointment. Pt was provided with adequate prescriptions of noncontrolled medications to reach the scheduled appointment.For controlled substances, a limited supply was provided as appropriate for the individual patient. If the pt normally receives these medications from a pain clinic or has a contract with another physician, these medications should be received from that clinic or physician only).    Future labs/tests needed:  CBC, BMP in 1-2 weeks with PCP   Patient ID: Tracy Todd, male   DOB: 05-Nov-1965, 55 y.o.   MRN: [id number]

## 2020-03-03 NOTE — Progress Notes (Signed)
Received call from nurse at [facility] while at other facility.  Resident had a generalized tonic clonic seizure early this afternoon.  It took him about 35 mins to recover postictally.  He is now back to baseline.  He is to d/c to group home tomorrow from [facility]. Ordered keppra 500mg  po bid for him to start tonight and will need to be continued on this outpatient and f/u with his neurologist.  Had prior seizures per his chart.    Elliyah Liszewski L. Shera Laubach, D.O. Geriatrics [facility] [address], [place] Cell Phone (Mon-Fri 8am-5pm):  [phone] On Call:  [phone] & follow prompts after 5pm & weekends Office Phone:  [phone] Office Fax:  [phone]

## 2020-04-03 ENCOUNTER — Encounter (HOSPITAL_BASED_OUTPATIENT_CLINIC_OR_DEPARTMENT_OTHER): Payer: Medicare Other | Attending: Internal Medicine | Admitting: Internal Medicine

## 2020-04-03 DIAGNOSIS — L89154 Pressure ulcer of sacral region, stage 4: Secondary | ICD-10-CM | POA: Insufficient documentation

## 2020-04-03 DIAGNOSIS — L89012 Pressure ulcer of right elbow, stage 2: Secondary | ICD-10-CM | POA: Diagnosis not present

## 2020-04-03 DIAGNOSIS — Z79899 Other long term (current) drug therapy: Secondary | ICD-10-CM | POA: Diagnosis not present

## 2020-04-03 DIAGNOSIS — Q909 Down syndrome, unspecified: Secondary | ICD-10-CM | POA: Diagnosis not present

## 2020-04-03 DIAGNOSIS — L89224 Pressure ulcer of left hip, stage 4: Secondary | ICD-10-CM | POA: Diagnosis present

## 2020-04-03 DIAGNOSIS — L03116 Cellulitis of left lower limb: Secondary | ICD-10-CM | POA: Insufficient documentation

## 2020-04-03 DIAGNOSIS — I6521 Occlusion and stenosis of right carotid artery: Secondary | ICD-10-CM | POA: Diagnosis not present

## 2020-04-03 DIAGNOSIS — Z7902 Long term (current) use of antithrombotics/antiplatelets: Secondary | ICD-10-CM | POA: Insufficient documentation

## 2020-04-10 ENCOUNTER — Encounter (HOSPITAL_BASED_OUTPATIENT_CLINIC_OR_DEPARTMENT_OTHER): Payer: Medicare Other | Admitting: Internal Medicine

## 2020-04-10 DIAGNOSIS — L89224 Pressure ulcer of left hip, stage 4: Secondary | ICD-10-CM | POA: Diagnosis not present

## 2020-04-13 NOTE — Progress Notes (Signed)
Tracy Todd, Tracy Todd ([id number]) Visit Report for 04/10/2020 HPI Details Patient Name: Date of Service: Tracy Todd, Tracy Todd 04/10/2020 8:45 A M Medical Record Number: [id number] Patient Account Number: [id number] Date of Birth/Sex: Treating RN: 16-May-1966 (54 y.o. Tracy Todd Primary Care Provider: Antony Haste Other Clinician: Referring Provider: Treating Provider/Extender: Murtis Sink in Treatment: 1 History of Present Illness HPI Description: 54 year old Down syndrome patient who unfortunately developed 2 deep tissue pressure wounds while recuperating at skilled nursing facility in May, patient was admitted to [facility] health for syncopal event in April and after hospitalization was discharged to skilled nursing facility, his work-up included Doppler studies that showed Todd internal carotid artery stenosis for which she was placed on Plavix. After being at the skilled nursing facility patient developed 2 wounds and was admitted in June for an 8-day hospital stay for wound care. 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 He had a pelvic CT that showed ulceration extending along the Todd buttock into the medial margin of the gluteus maximus muscle without signs of osteomyelitis. Patient had wound care with bedside debridement and discharge to different facility at discharge. He is currently at a group home where he is getting home health to assist with wound care especially with central and daily dressing changes. And referred to the wound clinic for further care. Patient is nonverbal, and cannot follow instructions, sister and caregiver are present at bedside. According to them the wounds look "better" than before considering that he had black eschar covering the wounds and had some debridement performed that remove this layer. Patient's currently on iron supplements, Plavix, nutritional  supplements, lipid-lowering agent. 04/10/2020; this is a patient with 2 stage IV wounds in the lower sacrum and the left greater trochanter. He required debridement last week. They have been using Santyl but apparently they ran out of this and Medicaid would not cover a represcription. The patient has Down syndrome. In talking with his sister he is no longer verbal and nonambulatory this is a big deterioration over the last year. She states that she thinks this was a result of the first shot of pneumonia during a vaccine last January although I think he was having some problem before that. His caregiver is also here. Apparently eats and drinks well they crush his medications. They have an eggcrate pressure relief surface Electronic Signature(s) Signed: 04/13/2020 7:15:26 AM By: Baltazar Najjar MD Entered By: Baltazar Najjar on 04/10/2020 10:50:19 -------------------------------------------------------------------------------- Physical Exam Details Patient Name: Date of Service: Tracy Todd, Tracy Todd 04/10/2020 8:45 A M Medical Record Number: [id number] Patient Account Number: [id number] Date of Birth/Sex: Treating RN: 02-12-1966 (54 y.o. Tracy Todd Primary Care Provider: Antony Haste Other Clinician: Referring Provider: Treating Provider/Extender: Murtis Sink in Treatment: 1 Constitutional Sitting or standing Blood Pressure is within target range for patient.. Pulse regular and within target range for patient.Marland Kitchen Respirations regular, non-labored and within target range.. Temperature is normal and within the target range for the patient.Marland Kitchen Appears in no distress. Respiratory work of breathing is normal. Gastrointestinal (GI) Abdomen is soft and non-distended without masses or tenderness.. Genitourinary (GU) Bladder is not distended. 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 Psychiatric Nonverbal. Notes Wound exam; the patient  has a large wound over the left greater trochanter necrotic surface with undermining but no exposed bone. No evidence of surrounding infection oday the sacral wound has surrounding erythema which is  very tender. There is no purulence and no exposed bone. This is compatible with cellulitis T Electronic Signature(s) Signed: 04/13/2020 7:15:26 AM By: Baltazar Najjar MD Entered By: Baltazar Najjar on 04/10/2020 10:51:43 -------------------------------------------------------------------------------- Physician Orders Details Patient Name: Date of Service: Tracy Todd, Tracy Todd 04/10/2020 8:45 A M Medical Record Number: [id number] Patient Account Number: [id number] Date of Birth/Sex: Treating RN: 1966/03/25 (54 y.o. Tracy Todd Primary Care Provider: Antony Haste Other Clinician: Referring Provider: Treating Provider/Extender: Murtis Sink in Treatment: 1 Verbal / Phone Orders: No Diagnosis Coding ICD-10 Coding Code Description [id number] Pressure-induced deep tissue damage of left buttock L89.156 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 Q90.9 Down syndrome, unspecified Follow-up Appointments ppointment in 1 week. - WILL BE HOYER IF STAFF UNABLE TO LIFT Return A Other: - take antibiotics as prescribed Dressing Change Frequency Wound #1 Left Trochanter Change dressing every day. Wound #2 Sacrum Change dressing every day. Skin Barriers/Peri-Wound Care Wound #1 Left Trochanter Skin Prep Wound #2 Sacrum Skin Prep Wound Cleansing Wound #1 Left Trochanter Clean wound with Wound Cleanser Wound #2 Sacrum Clean wound with Wound Cleanser Primary Wound Dressing Wound #1 Left Trochanter Medihoney Alginate Wound #2 Sacrum Medihoney Alginate Secondary Dressing Wound #1 Left Trochanter Foam Border - or ABD pad and tape Wound #2 Sacrum Foam Border - or ABD pad and tape Off-Loading Turn and reposition every 2 hours Home Health Continue Home  Health skilled nursing for wound care. - [facility] Patient Medications llergies: penicillin A Notifications Medication Indication Start End wound infection 04/10/2020 Cipro DOSE oral 500 mg tablet - 1 tablet oral bid for 10 days Electronic Signature(s) Signed: 04/10/2020 10:55:09 AM By: Baltazar Najjar MD Entered By: Baltazar Najjar on 04/10/2020 10:55:08 -------------------------------------------------------------------------------- Problem List Details Patient Name: Date of Service: Tracy Todd, Tracy Todd 04/10/2020 8:45 A M Medical Record Number: [id number] Patient Account Number: [id number] Date of Birth/Sex: Treating RN: 07-29-1966 (54 y.o. Tracy Todd Primary Care Provider: Antony Haste Other Clinician: Referring Provider: Treating Provider/Extender: Murtis Sink in Treatment: 1 Active Problems ICD-10 Encounter Code Description Active Date MDM Diagnosis Q90.9 Down syndrome, unspecified 04/03/2020 No Yes L89.224 Pressure ulcer of left hip, stage 4 04/10/2020 No Yes L89.154 Pressure ulcer of sacral region, stage 4 04/10/2020 No Yes L03.116 Cellulitis of left lower limb 04/10/2020 No Yes Inactive Problems Resolved Problems Electronic Signature(s) Signed: 04/13/2020 7:15:26 AM By: Baltazar Najjar MD Entered By: Baltazar Najjar on 04/10/2020 10:48:48 -------------------------------------------------------------------------------- Progress Note Details Patient Name: Date of Service: Tracy Todd, Tracy Todd 04/10/2020 8:45 A M Medical Record Number: [id number] Patient Account Number: [id number] Date of Birth/Sex: Treating RN: 1966/05/25 (54 y.o. Tracy Todd Primary Care Provider: Antony Haste Other Clinician: Referring Provider: Treating Provider/Extender: Murtis Sink in Treatment: 1 Subjective History of Present Illness (HPI) 55 year old Down syndrome patient who unfortunately developed 2 deep tissue pressure wounds while  recuperating at skilled nursing facility in May, patient was admitted to [facility] health for syncopal event in April and after hospitalization was discharged to skilled nursing facility, his work-up included Doppler studies that showed Todd internal carotid artery stenosis for which she was placed on Plavix. After being at the skilled nursing facility patient developed 2 wounds and was admitted in June for an 8-day hospital stay for wound care. 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 He had a pelvic CT that showed ulceration extending along the Todd buttock into the medial margin of  the gluteus maximus muscle without signs of osteomyelitis. Patient had wound care with bedside debridement and discharge to different facility at discharge. He is currently at a group home where he is getting home health to assist with wound care especially with central and daily dressing changes. And referred to the wound clinic for further care. Patient is nonverbal, and cannot follow instructions, sister and caregiver are present at bedside. According to them the wounds look "better" than before considering that he had black eschar covering the wounds and had some debridement performed that remove this layer. Patient's currently on iron supplements, Plavix, nutritional supplements, lipid-lowering agent. 04/10/2020; this is a patient with 2 stage IV wounds in the lower sacrum and the left greater trochanter. He required debridement last week. They have been using Santyl but apparently they ran out of this and Medicaid would not cover a represcription. The patient has Down syndrome. In talking with his sister he is no longer verbal and nonambulatory this is a big deterioration over the last year. She states that she thinks this was a result of the first shot of pneumonia during a vaccine last January although I think he was having some problem  before that. His caregiver is also here. Apparently eats and drinks well they crush his medications. They have an eggcrate pressure relief surface Objective Constitutional Sitting or standing Blood Pressure is within target range for patient.. Pulse regular and within target range for patient.Marland Kitchen. Respirations regular, non-labored and within target range.. Temperature is normal and within the target range for the patient.Marland Kitchen. Appears in no distress. Vitals Time Taken: 9:48 AM, Height: 60 in, Weight: 103 lbs, BMI: 20.1, Temperature: 97.7 F, Pulse: 106 bpm, Respiratory Rate: 18 breaths/min, Blood Pressure: 113/73 mmHg. Respiratory work of breathing is normal. Gastrointestinal (GI) Abdomen is soft and non-distended without masses or tenderness.. Genitourinary (GU) Bladder is not distended. 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 Psychiatric Nonverbal. General Notes: Wound exam; the patient has a large wound over the left greater trochanter necrotic surface with undermining but no exposed bone. No evidence of surrounding infection ooToday the sacral wound has surrounding erythema which is very tender. There is no purulence and no exposed bone. This is compatible with cellulitis Integumentary (Hair, Skin) Wound #1 status is Open. Original cause of wound was Gradually Appeared. The wound is located on the Left Trochanter. The wound measures 3.5cm length x 2.8cm width x 1.4cm depth; 7.697cm^2 area and 10.776cm^3 volume. There is muscle, tendon, and Fat Layer (Subcutaneous Tissue) Exposed exposed. There is no tunneling noted, however, there is undermining starting at 12:00 and ending at 12:00 with a maximum distance of 2.5cm. There is a medium amount of serosanguineous drainage noted. The wound margin is well defined and not attached to the wound base. There is small (1-33%) red, pink granulation within the wound bed. There is a large (67-100%) amount of necrotic  tissue within the wound bed including Eschar and Adherent Slough. Wound #2 status is Open. Original cause of wound was Gradually Appeared. The wound is located on the Sacrum. The wound measures 5.6cm length x 2.5cm width x 1.1cm depth; 10.996cm^2 area and 12.095cm^3 volume. There is muscle, tendon, and Fat Layer (Subcutaneous Tissue) Exposed exposed. There is no tunneling noted, however, there is undermining starting at 6:00 and ending at 9:00 with a maximum distance of 3cm. There is a medium amount of serosanguineous drainage noted. The wound margin is well defined and not attached to the wound base. There  is medium (34-66%) pink granulation within the wound bed. There is a medium (34-66%) amount of necrotic tissue within the wound bed including Eschar and Adherent Slough. 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 - WILL BE HOYER IF STAFF UNABLE TO LIFT Other: - take antibiotics as prescribed Dressing Change Frequency: Wound #1 Left Trochanter: Change dressing every day. Wound #2 Sacrum: Change dressing every day. Skin Barriers/Peri-Wound Care: Wound #1 Left Trochanter: Skin Prep Wound #2 Sacrum: Skin Prep Wound Cleansing: Wound #1 Left Trochanter: Clean wound with Wound Cleanser Wound #2 Sacrum: Clean wound with Wound Cleanser Primary Wound Dressing: Wound #1 Left Trochanter: Medihoney Alginate Wound #2 Sacrum: Medihoney Alginate Secondary Dressing: Wound #1 Left Trochanter: Foam Border - or ABD pad and tape Wound #2 Sacrum: Foam Border - or ABD pad and tape Off-Loading: Turn and reposition every 2 hours Home Health: Continue Home Health skilled nursing for wound care. - [facility] The following medication(s) was prescribed: Cipro oral 500 mg tablet 1 tablet oral bid for 10 days for wound  infection starting 04/10/2020 #1 in view of the fact that Medicaid will not pay for Santyl we use Medihoney alginate. This is absorptive and helps with debridement as well as being antibacterial. 2. The patient will require antibiotics for the erythema and tenderness around the left greater trochanter. I did not see anything that was worth culturing here. He is allergic to penicillin I gave him empiric ciprofloxacin 500 twice daily for 10 days 3. I have marked the area of erythema and counseled them that if things deteriorate he may need to seek urgent medical attention 4. The patient is very frail and probably has advanced cognitive impairment. I told the sister that I respected her view that the majority of vaccine caused this deterioration but I also told her that 100% of people with Down syndrome get Alzheimer's disease. She told me she read this last night. In view of this I did have no doubt that he would not be a candidate for plastic surgery, flap closure etc. Electronic Signature(s) Signed: 04/10/2020 10:55:29 AM By: Baltazar Najjar MD Entered By: Baltazar Najjar on 04/10/2020 10:55:29 -------------------------------------------------------------------------------- SuperBill Details Patient Name: Date of Service: Tracy Todd, Tracy Todd 04/10/2020 Medical Record Number: [id number] Patient Account Number: [id number] Date of Birth/Sex: Treating RN: [birth year] (54 y.o. Tracy Todd Primary Care Provider: Antony Haste Other Clinician: Referring Provider: Treating Provider/Extender: Murtis Sink in Treatment: 1 Diagnosis Coding ICD-10 Codes Code Description Q90.9 Down syndrome, unspecified L89.224 Pressure ulcer of left hip, stage 4 L89.154 Pressure ulcer of sacral region, stage 4 L03.116 Cellulitis of left lower limb Facility Procedures CPT4 Code: [id number] Description: 99214 - WOUND CARE VISIT-LEV 4 EST PT Modifier: Quantity: 1 Physician Procedures :  CPT4 Code Description Modifier [id number] 99214 - WC PHYS LEVEL 4 - EST PT ICD-10 Diagnosis Description L89.224 Pressure ulcer of left hip, stage 4 L89.154 Pressure ulcer of sacral region, stage 4 L03.116 Cellulitis of left lower limb Quantity: 1 Electronic Signature(s) Signed: 04/13/2020 7:15:26 AM By: Baltazar Najjar MD Entered By: Baltazar Najjar on 04/10/2020 10:56:09

## 2020-04-14 NOTE — Progress Notes (Signed)
Tracy Todd, Tracy Todd ([id number]) Visit Report for 04/10/2020 Arrival Information Details Patient Name: Date of Service: Tracy Todd, Tracy Todd 04/10/2020 8:45 A M Medical Record Number: [id number] Patient Account Number: [id number] Date of Birth/Sex: Treating RN: 04-29-1966 (54 y.o. Tracy Todd Primary Care Timithy Arons: Antony Haste Other Clinician: Referring Zaire Levesque: Treating Xiao Graul/Extender: Murtis Sink in Treatment: 1 Visit Information History Since Last Visit Added or deleted any medications: No Patient Arrived: Dan Humphreys Any new allergies or adverse reactions: No Arrival Time: 09:47 Had a fall or experienced change in No Accompanied By: sister/caregiver activities of daily living that may affect Transfer Assistance: EasyPivot Patient Lift risk of falls: Patient Identification Verified: Yes Signs or symptoms of abuse/neglect since last visito No Secondary Verification Process Completed: Yes Hospitalized since last visit: No Patient Requires Transmission-Based Precautions: No Implantable device outside of the clinic excluding No Patient Has Alerts: No cellular tissue based products placed in the center since last visit: Has Dressing in Place as Prescribed: Yes Pain Present Now: No Electronic Signature(s) Signed: 04/13/2020 2:53:33 PM By: Karl Ito Entered By: Karl Ito on 04/10/2020 09:48:00 -------------------------------------------------------------------------------- Clinic Level of Care Assessment Details Patient Name: Date of Service: Tracy Todd, Tracy Todd 04/10/2020 8:45 A M Medical Record Number: [id number] Patient Account Number: [id number] Date of Birth/Sex: Treating RN: 1966/04/08 (54 y.o. Tracy Todd Primary Care Averey Koning: Antony Haste Other Clinician: Referring Asmaa Tirpak: Treating Rita Vialpando/Extender: Murtis Sink in Treatment: 1 Clinic Level of Care Assessment Items TOOL 4 Quantity Score X- 1 0 Use  when only an EandM is performed on FOLLOW-UP visit ASSESSMENTS - Nursing Assessment / Reassessment X- 1 10 Reassessment of Co-morbidities (includes updates in patient status) X- 1 5 Reassessment of Adherence to Treatment Plan ASSESSMENTS - Wound and Skin A ssessment / Reassessment []  - 0 Simple Wound Assessment / Reassessment - one wound X- 2 5 Complex Wound Assessment / Reassessment - multiple wounds []  - 0 Dermatologic / Skin Assessment (not related to wound area) ASSESSMENTS - Focused Assessment []  - 0 Circumferential Edema Measurements - multi extremities []  - 0 Nutritional Assessment / Counseling / Intervention []  - 0 Lower Extremity Assessment (monofilament, tuning fork, pulses) []  - 0 Peripheral Arterial Disease Assessment (using hand held doppler) ASSESSMENTS - Ostomy and/or Continence Assessment and Care []  - 0 Incontinence Assessment and Management []  - 0 Ostomy Care Assessment and Management (repouching, etc.) PROCESS - Coordination of Care X - Simple Patient / Family Education for ongoing care 1 15 []  - 0 Complex (extensive) Patient / Family Education for ongoing care X- 1 10 Staff obtains , Records, T Results / Process Orders est X- 1 10 Staff telephones HHA, Nursing Homes / Clarify orders / etc []  - 0 Routine Transfer to another Facility (non-emergent condition) []  - 0 Routine Hospital Admission (non-emergent condition) []  - 0 New Admissions / / Ordering NPWT Apligraf, etc. , []  - 0 Emergency Hospital Admission (emergent condition) X- 1 10 Simple Discharge Coordination []  - 0 Complex (extensive) Discharge Coordination PROCESS - Special Needs []  - 0 Pediatric / Minor Patient Management []  - 0 Isolation Patient Management []  - 0 Hearing / Language / Visual special needs []  - 0 Assessment of Community assistance (transportation, D/C planning, etc.) []  - 0 Additional assistance / Altered mentation []  - 0 Support  Surface(s) Assessment (bed, cushion, seat, etc.) INTERVENTIONS - Wound Cleansing / Measurement []  - 0 Simple Wound Cleansing - one wound X- 2 5 Complex Wound Cleansing - multiple wounds X- 1 5 Wound Imaging (  photographs - any number of wounds) []  - 0 Wound Tracing (instead of photographs) []  - 0 Simple Wound Measurement - one wound X- 2 5 Complex Wound Measurement - multiple wounds INTERVENTIONS - Wound Dressings []  - 0 Small Wound Dressing one or multiple wounds X- 2 15 Medium Wound Dressing one or multiple wounds []  - 0 Large Wound Dressing one or multiple wounds X- 1 5 Application of Medications - topical []  - 0 Application of Medications - injection INTERVENTIONS - Miscellaneous []  - 0 External ear exam []  - 0 Specimen Collection (cultures, biopsies, blood, body fluids, etc.) []  - 0 Specimen(s) / Culture(s) sent or taken to Lab for analysis []  - 0 Patient Transfer (multiple staff / / Similar devices) []  - 0 Simple Staple / Suture removal (25 or less) []  - 0 Complex Staple / Suture removal (26 or more) []  - 0 Hypo / Hyperglycemic Management (close monitor of Blood Glucose) []  - 0 Ankle / Brachial Index (ABI) - do not check if billed separately X- 1 5 Vital Signs Has the patient been seen at the hospital within the last three years: Yes Total Score: 135 Level Of Care: New/Established - Level 4 Electronic Signature(s) Signed: 04/10/2020 4:56:28 PM By: Entered By: on 04/10/2020 10:28:05 -------------------------------------------------------------------------------- Lower Extremity Assessment Details Patient Name: Date of Service: Tracy Todd, Tracy Todd 04/10/2020 8:45 A M Medical Record Number: Patient Account Number: Date of Birth/Sex: Treating RN: Dec 06, 1965 (54 y.o. Primary Care Meilani Edmundson: Other Clinician: Referring Derrion Tritz: Treating Kaison Mcparland/Extender: in Treatment: 1 Electronic Signature(s) Signed: 04/10/2020 4:56:28 PM By: Cherylin Mylar Entered By: Cherylin Mylar on 04/10/2020 10:25:45 -------------------------------------------------------------------------------- Multi Wound Chart Details Patient Name: Date of Service: Tracy Todd, Tracy Todd 04/10/2020 8:45 A M Medical Record Number: [id number] Patient Account Number: [id number] Date of Birth/Sex: Treating RN: 06-06-1966 (54 y.o. Tracy Todd Primary Care Jaan Fischel: Antony Haste Other Clinician: Referring Maclovia Uher: Treating Marly Schuld/Extender: Murtis Sink in Treatment: 1 Vital Signs Height(in): 60 Pulse(bpm): 106 Weight(lbs): 103 Blood Pressure(mmHg): 113/73 Body Mass Index(BMI): 20 Temperature(F): 97.7 Respiratory Rate(breaths/min): 18 Photos: [1:No Photos Left Trochanter] [2:No Photos Sacrum] [N/A:N/A N/A] Wound Location: [1:Gradually Appeared] [2:Gradually Appeared] [N/A:N/A] Wounding Event: [1:Pressure Ulcer] [2:Pressure Ulcer] [N/A:N/A] Primary Etiology: [1:12/21/2019] [2:12/21/2019] [N/A:N/A] Date Acquired: [1:1] [2:1] [N/A:N/A] Weeks of Treatment: [1:Open] [2:Open] [N/A:N/A] Wound Status: [1:3.5x2.8x1.4] [2:5.6x2.5x1.1] [N/A:N/A] Measurements L x W x D (cm) [1:7.697] [2:10.996] [N/A:N/A] A (cm) : rea [1:10.776] [2:12.095] [N/A:N/A] Volume (cm) : [1:45.60%] [2:22.20%] [N/A:N/A] % Reduction in A rea: [1:36.50%] [2:34.20%] [N/A:N/A] % Reduction in Volume: [1:12] [2:6] Starting Position 1 (o'clock): [1:12] [2:9] Ending Position 1 (o'clock): [1:2.5] [2:3] Maximum Distance 1 (cm): [1:Yes] [2:Yes] [N/A:N/A] Undermining: [1:Category/Stage IV] [2:Category/Stage IV] [N/A:N/A] Classification: [1:Medium] [2:Medium] [N/A:N/A] Exudate A mount: [1:Serosanguineous] [2:Serosanguineous] [N/A:N/A] Exudate Type: [1:red, brown] [2:red, brown] [N/A:N/A] Exudate Color: [1:Well defined, not attached] [2:Well defined, not  attached] [N/A:N/A] Wound Margin: [1:Small (1-33%)] [2:Medium (34-66%)] [N/A:N/A] Granulation Amount: [1:Red, Pink] [2:Pink] [N/A:N/A] Granulation Quality: [1:Large (67-100%)] [2:Medium (34-66%)] [N/A:N/A] Necrotic Amount: [1:Eschar, Adherent Slough] [2:Eschar, Adherent Slough] [N/A:N/A] Necrotic Tissue: [1:Fat Layer (Subcutaneous Tissue): Yes Fat Layer (Subcutaneous Tissue): Yes N/A] Exposed Structures: [1:Tendon: Yes Muscle: Yes Fascia: No Joint: No Bone: No Small (1-33%)] [2:Tendon: Yes Muscle: Yes Fascia: No Joint: No Bone: No None] [N/A:N/A] Treatment Notes Electronic Signature(s) Signed: 04/10/2020 4:56:28 PM By: 04/12/2020 Signed: 04/13/2020 7:15:26 AM By: 04/12/2020 MD Entered By: [id number] on 04/10/2020 10:48:56 -------------------------------------------------------------------------------- Multi-Disciplinary Care  Plan Details Patient Name: Date of Service: Tracy Todd, Tracy Todd 04/10/2020 8:45 A M Medical Record Number: [id number] Patient Account Number: [id number] Date of Birth/Sex: Treating RN: Dec 19, 1965 (54 y.o. Tracy Todd Primary Care Matt Delpizzo: Antony Haste Other Clinician: Referring Amyiah Gaba: Treating Sunshine Mackowski/Extender: Murtis Sink in Treatment: 1 Active Inactive Pain, Acute or Chronic Nursing Diagnoses: Pain, acute or chronic: actual or potential Goals: Patient/caregiver will verbalize adequate pain control between visits Date Initiated: 04/03/2020 Target Resolution Date: 05/08/2020 Goal Status: Active Interventions: Provide education on pain management Notes: Pressure Nursing Diagnoses: Knowledge deficit related to management of pressures ulcers Goals: Patient/caregiver will verbalize risk factors for pressure ulcer development Date Initiated: 04/03/2020 Target Resolution Date: 05/08/2020 Goal Status: Active Interventions: Provide education on pressure ulcers Notes: Electronic Signature(s) Signed: 04/10/2020  4:56:28 PM By: Cherylin Mylar Entered By: Cherylin Mylar on 04/10/2020 10:26:46 -------------------------------------------------------------------------------- Pain Assessment Details Patient Name: Date of Service: Tracy Todd, Tracy Todd 04/10/2020 8:45 A M Medical Record Number: [id number] Patient Account Number: [id number] Date of Birth/Sex: Treating RN: 01-09-1966 (54 y.o. Tracy Todd Primary Care Ulla Mckiernan: Antony Haste Other Clinician: Referring Tenzin Pavon: Treating Ashely Goosby/Extender: Murtis Sink in Treatment: 1 Active Problems Location of Pain Severity and Description of Pain Patient Has Paino No Site Locations Pain Management and Medication Current Pain Management: Electronic Signature(s) Signed: 04/10/2020 4:56:28 PM By: Cherylin Mylar Signed: 04/13/2020 2:53:33 PM By: Karl Ito Entered By: Karl Ito on 04/10/2020 09:48:30 -------------------------------------------------------------------------------- Patient/Caregiver Education Details Patient Name: Date of Service: Tracy Todd, Tracy Todd 8/20/2021andnbsp8:45 A M Medical Record Number: [id number] Patient Account Number: [id number] Date of Birth/Gender: Treating RN: 1965/10/01 (54 y.o. Tracy Todd Primary Care Physician: Antony Haste Other Clinician: Referring Physician: Treating Physician/Extender: Murtis Sink in Treatment: 1 Education Assessment Education Provided To: Caregiver Education Topics Provided Pressure: Handouts: Pressure Ulcers: Care and Offloading Methods: Explain/Verbal Responses: State content correctly Electronic Signature(s) Signed: 04/10/2020 4:56:28 PM By: Cherylin Mylar Entered By: Cherylin Mylar on 04/10/2020 10:27:05 -------------------------------------------------------------------------------- Wound Assessment Details Patient Name: Date of Service: Tracy Todd, Tracy Todd 04/10/2020 8:45 A M Medical Record  Number: [id number] Patient Account Number: [id number] Date of Birth/Sex: Treating RN: 09-19-1965 (54 y.o. Tracy Todd Primary Care Dailin Sosnowski: Antony Haste Other Clinician: Referring Jalen Daluz: Treating Jeffry Vogelsang/Extender: Murtis Sink in Treatment: 1 Wound Status Wound Number: 1 Primary Etiology: Pressure Ulcer Wound Location: Left Trochanter Wound Status: Open Wounding Event: Gradually Appeared Date Acquired: 12/21/2019 Weeks Of Treatment: 1 Clustered Wound: No Photos Photo Uploaded By: Benjaman Kindler on 04/13/2020 12:10:07 Wound Measurements Length: (cm) 3.5 Width: (cm) 2.8 Depth: (cm) 1.4 Area: (cm) 7.697 Volume: (cm) 10.776 % Reduction in Area: 45.6% % Reduction in Volume: 36.5% Epithelialization: Small (1-33%) Tunneling: No Undermining: Yes Starting Position (o'clock): 12 Ending Position (o'clock): 12 Maximum Distance: (cm) 2.5 Wound Description Classification: Category/Stage IV Wound Margin: Well defined, not attached Exudate Amount: Medium Exudate Type: Serosanguineous Exudate Color: red, brown Foul Odor After Cleansing: No Slough/Fibrino Yes Wound Bed Granulation Amount: Small (1-33%) Exposed Structure Granulation Quality: Red, Pink Fascia Exposed: No Necrotic Amount: Large (67-100%) Fat Layer (Subcutaneous Tissue) Exposed: Yes Necrotic Quality: Eschar, Adherent Slough Tendon Exposed: Yes Muscle Exposed: Yes Necrosis of Muscle: No Joint Exposed: No Bone Exposed: No Electronic Signature(s) Signed: 04/10/2020 4:56:28 PM By: Cherylin Mylar Signed: 04/13/2020 4:21:43 PM By: Zandra Abts RN, BSN Entered By: Zandra Abts on 04/10/2020 10:03:56 -------------------------------------------------------------------------------- Wound Assessment Details Patient Name: Date of Service: Tracy Todd, Tracy Todd 04/10/2020 8:45 A M Medical Record Number: [id number] Patient Account Number: [id number] Date of Birth/Sex:  Treating  RN: 02/05/1966 (54 y.o. Tracy RightM) Dwiggins, Shannon Primary Care Duwane Gewirtz: Antony HasteBadger, Michael Other Clinician: Referring Kaelee Pfeffer: Treating Holly Pring/Extender: Murtis Sinkobson, Michael Badger, Michael Weeks in Treatment: 1 Wound Status Wound Number: 2 Primary Etiology: Pressure Ulcer Wound Location: Sacrum Wound Status: Open Wounding Event: Gradually Appeared Date Acquired: 12/21/2019 Weeks Of Treatment: 1 Clustered Wound: No Photos Photo Uploaded By: Benjaman KindlerJones, Dedrick on 04/13/2020 12:10:07 Wound Measurements Length: (cm) 5.6 Width: (cm) 2.5 Depth: (cm) 1.1 Area: (cm) 10.996 Volume: (cm) 12.095 % Reduction in Area: 22.2% % Reduction in Volume: 34.2% Epithelialization: None Tunneling: No Undermining: Yes Starting Position (o'clock): 6 Ending Position (o'clock): 9 Maximum Distance: (cm) 3 Wound Description Classification: Category/Stage IV Wound Margin: Well defined, not attached Exudate Amount: Medium Exudate Type: Serosanguineous Exudate Color: red, brown Foul Odor After Cleansing: No Slough/Fibrino Yes Wound Bed Granulation Amount: Medium (34-66%) Exposed Structure Granulation Quality: Pink Fascia Exposed: No Necrotic Amount: Medium (34-66%) Fat Layer (Subcutaneous Tissue) Exposed: Yes Necrotic Quality: Eschar, Adherent Slough Tendon Exposed: Yes Muscle Exposed: Yes Necrosis of Muscle: No Joint Exposed: No Bone Exposed: No Electronic Signature(s) Signed: 04/10/2020 4:56:28 PM By: Cherylin Mylarwiggins, Shannon Signed: 04/13/2020 4:21:43 PM By: Zandra AbtsLynch, Shatara RN, BSN Entered By: Zandra AbtsLynch, Shatara on 04/10/2020 10:05:09 -------------------------------------------------------------------------------- Vitals Details Patient Name: Date of Service: Tracy AmbleLLER, Tracy Todd 04/10/2020 8:45 A M Medical Record Number: [id number] Patient Account Number: [id number] Date of Birth/Sex: Treating RN: 02/05/1966 (54 y.o. Tracy RightM) Dwiggins, Shannon Primary Care Khristy Kalan: Antony HasteBadger, Michael Other Clinician: Referring  Deidra Spease: Treating Donyel Nester/Extender: Murtis Sinkobson, Michael Badger, Michael Weeks in Treatment: 1 Vital Signs Time Taken: 09:48 Temperature (F): 97.7 Height (in): 60 Pulse (bpm): 106 Weight (lbs): 103 Respiratory Rate (breaths/min): 18 Body Mass Index (BMI): 20.1 Blood Pressure (mmHg): 113/73 Reference Range: 80 - 120 mg / dl Electronic Signature(s) Signed: 04/13/2020 2:53:33 PM By: Karl Itoawkins, Destiny Entered By: Karl Itoawkins, Destiny on 04/10/2020 09:48:20

## 2020-04-17 ENCOUNTER — Other Ambulatory Visit: Payer: Self-pay

## 2020-04-17 ENCOUNTER — Encounter (HOSPITAL_BASED_OUTPATIENT_CLINIC_OR_DEPARTMENT_OTHER): Payer: Medicare Other | Admitting: Internal Medicine

## 2020-04-17 DIAGNOSIS — L89224 Pressure ulcer of left hip, stage 4: Secondary | ICD-10-CM | POA: Diagnosis not present

## 2020-04-18 NOTE — Progress Notes (Signed)
Tracy Todd, Tracy Todd ([id number]) Visit Report for 04/03/2020 Chief Complaint Document Details Patient Name: Date of Service: Tracy Todd, Tracy Todd 04/03/2020 1:15 PM Medical Record Number: [id number] Patient Account Number: [id number] Date of Birth/Sex: Treating RN: 06/16/1966 (54 y.o. [name] Primary Care Provider: Antony Haste Other Clinician: Referring Provider: Treating Provider/Extender: Delphina Cahill in Treatment: 0 Information Obtained from: Patient Chief Complaint Left gluteal and sacral wounds x 6 weeks Electronic Signature(s) Signed: 04/03/2020 3:03:05 PM By: Cassandria Anger MD, MBA Entered By: Cassandria Anger on 04/03/2020 15:03:05 -------------------------------------------------------------------------------- Debridement Details Patient Name: Date of Service: Tracy Todd, Tracy Todd 04/03/2020 1:15 PM Medical Record Number: [id number] Patient Account Number: [id number] Date of Birth/Sex: Treating RN: February 16, 1966 (54 y.o. [name] Primary Care Provider: Antony Haste Other Clinician: Referring Provider: Treating Provider/Extender: Delphina Cahill in Treatment: 0 Debridement Performed for Assessment: Wound #1 Left Trochanter Performed By: Physician Cassandria Anger, MD Debridement Type: Debridement Level of Consciousness (Pre-procedure): Awake and Alert Pre-procedure Verification/Time Out Yes - 14:41 Taken: Start Time: 14:41 Pain Control: Other : benzocaine, 20% T Area Debrided (L x W): otal 4.5 (cm) x 4 (cm) = 18 (cm) Tissue and other material debrided: Viable, Non-Viable, Slough, Subcutaneous, Slough Level: Skin/Subcutaneous Tissue Debridement Description: Excisional Instrument: Blade, Curette, Forceps, Scissors Bleeding: Minimum Hemostasis Achieved: Pressure End Time: 14:42 Procedural Pain: 0 Post Procedural Pain: 0 Response to Treatment: Procedure was tolerated well Level of Consciousness (Post- Awake and  Alert procedure): Post Debridement Measurements of Total Wound Length: (cm) 4.5 Stage: Category/Stage IV Width: (cm) 4 Depth: (cm) 1.2 Volume: (cm) 16.965 Character of Wound/Ulcer Post Debridement: Requires Further Debridement Post Procedure Diagnosis Same as Pre-procedure Electronic Signature(s) Signed: 04/03/2020 4:53:28 PM By: Cherylin Mylar Signed: 04/03/2020 4:53:36 PM By: Cassandria Anger MD, MBA Entered By: Cherylin Mylar on 04/03/2020 14:42:43 -------------------------------------------------------------------------------- Debridement Details Patient Name: Date of Service: Tracy Todd, Tracy Todd 04/03/2020 1:15 PM Medical Record Number: [id number] Patient Account Number: [id number] Date of Birth/Sex: Treating RN: March 21, 1966 (54 y.o. [name] Primary Care Provider: Antony Haste Other Clinician: Referring Provider: Treating Provider/Extender: Delphina Cahill in Treatment: 0 Debridement Performed for Assessment: Wound #2 Sacrum Performed By: Physician Cassandria Anger, MD Debridement Type: Debridement Level of Consciousness (Pre-procedure): Awake and Alert Pre-procedure Verification/Time Out Yes - 14:41 Taken: Start Time: 14:41 Pain Control: Other : benzocaine, 20% T Area Debrided (L x W): otal 6 (cm) x 3 (cm) = 18 (cm) Tissue and other material debrided: Viable, Non-Viable, Slough, Subcutaneous, Tendon, Slough Level: Skin/Subcutaneous Tissue/Muscle Debridement Description: Excisional Instrument: Blade, Curette, Forceps, Scissors Bleeding: Minimum Hemostasis Achieved: Pressure End Time: 14:42 Procedural Pain: 0 Post Procedural Pain: 0 Response to Treatment: Procedure was tolerated well Level of Consciousness (Post- Awake and Alert procedure): Post Debridement Measurements of Total Wound Length: (cm) 6 Stage: Category/Stage IV Width: (cm) 3 Depth: (cm) 1.3 Volume: (cm) 18.378 Character of Wound/Ulcer Post Debridement:  Requires Further Debridement Post Procedure Diagnosis Same as Pre-procedure Electronic Signature(s) Signed: 04/03/2020 4:53:28 PM By: Cherylin Mylar Signed: 04/03/2020 4:53:36 PM By: Cassandria Anger MD, MBA Entered By: Cherylin Mylar on 04/03/2020 14:58:36 -------------------------------------------------------------------------------- HPI Details Patient Name: Date of Service: Tracy Todd, Tracy Todd 04/03/2020 1:15 PM Medical Record Number: [id number] Patient Account Number: [id number] Date of Birth/Sex: Treating RN: 1966/02/24 (54 y.o. [name] Primary Care Provider: Antony Haste Other Clinician: Referring Provider: Treating Provider/Extender: Delphina Cahill in Treatment: 0 History of Present Illness HPI Description: 54 year old Down syndrome patient who unfortunately developed 2 deep tissue pressure wounds while recuperating at skilled  nursing facility in May, patient was admitted to [facility] health for syncopal event in April and after hospitalization was discharged to skilled nursing facility, his work-up included Doppler studies that showed right internal carotid artery stenosis for which she was placed on Plavix. After being at the skilled nursing facility patient developed 2 wounds and was admitted in June for an 8-day hospital stay for wound care. 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 He had a pelvic CT that showed ulceration extending along the right buttock into the medial margin of the gluteus maximus muscle without signs of osteomyelitis. Patient had wound care with bedside debridement and discharge to different facility at discharge. He is currently at a group home where he is getting home health to assist with wound care especially with central and daily dressing changes. And referred to the wound clinic for further care. Patient is nonverbal, and cannot follow  instructions, sister and caregiver are present at bedside. According to them the wounds look "better" than before considering that he had black eschar covering the wounds and had some debridement performed that remove this layer. Patient's currently on iron supplements, Plavix, nutritional supplements, lipid-lowering agent. Electronic Signature(s) Signed: 04/03/2020 3:25:33 PM By: Cassandria Anger MD, MBA Entered By: Cassandria Anger on 04/03/2020 15:25:32 -------------------------------------------------------------------------------- Physical Exam Details Patient Name: Date of Service: Tracy Todd, Tracy Todd 04/03/2020 1:15 PM Medical Record Number: [id number] Patient Account Number: [id number] Date of Birth/Sex: Treating RN: 21-Jun-1966 (54 y.o. [name] Primary Care Provider: Antony Haste Other Clinician: Referring Provider: Treating Provider/Extender: Delphina Cahill in Treatment: 0 Constitutional alert and oriented x 3. sitting or standing blood pressure is within target range for patient.. supine blood pressure is within target range for patient.. pulse regular and within target range for patient.Marland Kitchen respirations regular, non-labored and within target range for patient.Marland Kitchen temperature within target range for patient.. . . Well- nourished and well-hydrated in no acute distress. Eyes conjunctiva clear no eyelid edema noted. pupils equal round and reactive to light and accommodation. Ears, Nose, Mouth, and Throat no gross abnormality of ear auricles or external auditory canals. normal hearing noted during conversation. mucus membranes moist. Neck supple with no LAD noted in anterior or posterior cervical chain. not enlarged. Respiratory normal breathing without difficulty. clear to auscultation bilaterally. Cardiovascular regular rate and rhythm with normal S1, S2. no bruits with no significant JVD. 2+ femoral pulses. 2+ dorsalis pedis/posterior tibialis pulses. no  clubbing, cyanosis, significant edema, <3 sec cap refill. Gastrointestinal (GI) soft, non-tender, non-distended, +BS. no hepatosplenomegaly. no ventral hernia noted. Integumentary (Hair, Skin) normal hair distribution and pattern. skin pink, warm, dry. Neurological cranial nerves 2-12 intact. Patient has normal sensation in the feet bilaterally to light touch. Psychiatric this patient is able to make decisions and demonstrates good insight into disease process. Alert and Oriented x 3. pleasant and cooperative. Notes Left gluteal stage IV ulcer with visible tendon at base, with copious necrotic/devitalized tissue, some of it was removed using pickup and scalpel but this extends with undermining deeper into the wound with little healthy granulation tissue visible, This wound has undermining 12-12 up to 2 cm Sacral decub and midline with visible tendon at base, with devitalized tissue some of it was removed using scalpel-This wound is undermining up to 3 cm -12 Electronic Signature(s) Signed: 04/03/2020 3:27:21 PM By: Cassandria Anger MD, MBA Previous Signature: 04/03/2020 3:04:34 PM Version By: Cassandria Anger MD, MBA  Entered By: Cassandria Anger on 04/03/2020 15:27:20 -------------------------------------------------------------------------------- Physician Orders Details Patient Name: Date of Service: Tracy Todd, Tracy Todd 04/03/2020 1:15 PM Medical Record Number: [id number] Patient Account Number: [id number] Date of Birth/Sex: Treating RN: Dec 26, 1965 (54 y.o. [name] Primary Care Provider: Antony Haste Other Clinician: Referring Provider: Treating Provider/Extender: Delphina Cahill in Treatment: 0 Verbal / Phone Orders: No Diagnosis Coding Follow-up Appointments ppointment in 1 week. - Friday. ROOM 5, WILL BE HOYER IF STAFF UNABLE TO LIFT Return A Dressing Change Frequency Wound #1 Left Trochanter Change dressing every day. Wound #2 Sacrum Change  dressing every day. Skin Barriers/Peri-Wound Care Wound #1 Left Trochanter Skin Prep Wound #2 Sacrum Skin Prep Wound Cleansing Wound #1 Left Trochanter Clean wound with Wound Cleanser Wound #2 Sacrum Clean wound with Wound Cleanser Primary Wound Dressing Wound #1 Left Trochanter Santyl Ointment - apply nickel thick. Lightly saline moisten gauze packing over santyl Wound #2 Sacrum Santyl Ointment - apply nickel thick. Lightly saline moisten gauze packing over santyl Secondary Dressing Wound #1 Left Trochanter Foam Border - or ABD pad and tape Wound #2 Sacrum Foam Border - or ABD pad and tape Off-Loading Turn and reposition every 2 hours Electronic Signature(s) Signed: 04/03/2020 4:53:28 PM By: Cherylin Mylar Signed: 04/03/2020 4:53:36 PM By: Cassandria Anger MD, MBA Entered By: Cherylin Mylar on 04/03/2020 15:36:12 -------------------------------------------------------------------------------- Problem List Details Patient Name: Date of Service: Tracy Todd, Tracy Todd 04/03/2020 1:15 PM Medical Record Number: [id number] Patient Account Number: [id number] Date of Birth/Sex: Treating RN: [birth year] (53 y.o. [name] Primary Care Provider: Antony Haste Other Clinician: Referring Provider: Treating Provider/Extender: Delphina Cahill in Treatment: 0 Active Problems ICD-10 Encounter Code Description Active Date MDM Diagnosis 7600393916 Pressure-induced deep tissue damage of left buttock 04/03/2020 No Yes L89.156 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 04/03/2020 No Yes Q90.9 Down syndrome, unspecified 04/03/2020 No Yes Inactive Problems Resolved Problems Electronic Signature(s) Signed: 04/03/2020 3:02:42 PM By: Cassandria Anger MD, MBA Entered By: Cassandria Anger on 04/03/2020 15:02:41 -------------------------------------------------------------------------------- Progress Note Details Patient Name: Date of Service: Tracy Todd, Tracy Todd  04/03/2020 1:15 PM Medical Record Number: [id number] Patient Account Number: [id number] Date of Birth/Sex: Treating RN: 29-Mar-1966 (54 y.o. [name] Primary Care Provider: Antony Haste Other Clinician: Referring Provider: Treating Provider/Extender: Delphina Cahill in Treatment: 0 Subjective Chief Complaint Information obtained from Patient Left gluteal and sacral wounds x 6 weeks History of Present Illness (HPI) 54 year old Down syndrome patient who unfortunately developed 2 deep tissue pressure wounds while recuperating at skilled nursing facility in May, patient was admitted to [facility] health for syncopal event in April and after hospitalization was discharged to skilled nursing facility, his work-up included Doppler studies that showed right internal carotid artery stenosis for which she was placed on Plavix. After being at the skilled nursing facility patient developed 2 wounds and was admitted in June for an 8-day hospital stay for wound care. 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 He had a pelvic CT that showed ulceration extending along the right buttock into the medial margin of the gluteus maximus muscle without signs of osteomyelitis. Patient had wound care with bedside debridement and discharge to different facility at discharge. He is currently at a group home where he is getting home health to assist with wound care especially with central and daily dressing changes. And referred to the wound clinic for further care. Patient is nonverbal,  and cannot follow instructions, sister and caregiver are present at bedside. According to them the wounds look "better" than before considering that he had black eschar covering the wounds and had some debridement performed that remove this layer. Patient's currently on iron supplements, Plavix, nutritional supplements,  lipid-lowering agent. Patient History Unable to Obtain Patient History due to Altered Mental Status. Allergies penicillin Family History Cancer - Maternal Grandparents, Hypertension - Mother,Father, Kidney Disease - Siblings, No family history of Diabetes, Heart Disease, Hereditary Spherocytosis, Lung Disease, Seizures, Stroke, Thyroid Problems, Tuberculosis. Social History Never smoker, Alcohol Use - Never, Drug Use - No History, Caffeine Use - Daily. Medical History Eyes Denies history of Cataracts, Glaucoma, Optic Neuritis Ear/Nose/Mouth/Throat Denies history of Chronic sinus problems/congestion, Middle ear problems Hematologic/Lymphatic Denies history of Anemia, Hemophilia, Human Immunodeficiency Virus, Lymphedema, Sickle Cell Disease Respiratory Denies history of Aspiration, Asthma, Chronic Obstructive Pulmonary Disease (COPD), Pneumothorax, Sleep Apnea, Tuberculosis Cardiovascular Denies history of Angina, Arrhythmia, Congestive Heart Failure, Coronary Artery Disease, Deep Vein Thrombosis, Hypertension, Hypotension, Myocardial Infarction, Peripheral Arterial Disease, Peripheral Venous Disease, Phlebitis, Vasculitis Gastrointestinal Denies history of Cirrhosis , Colitis, Crohnoos, Hepatitis A, Hepatitis B, Hepatitis C Endocrine Denies history of Type I Diabetes, Type II Diabetes Genitourinary Denies history of End Stage Renal Disease Immunological Denies history of Lupus Erythematosus, Raynaudoos, Scleroderma Integumentary (Skin) Denies history of History of Burn Musculoskeletal Denies history of Gout, Rheumatoid Arthritis, Osteoarthritis, Osteomyelitis Neurologic Denies history of Dementia, Neuropathy, Quadriplegia, Paraplegia, Seizure Disorder Oncologic Denies history of Received Chemotherapy, Received Radiation Psychiatric Denies history of Anorexia/bulimia, Confinement Anxiety Review of Systems (ROS) Constitutional Symptoms (General Health) Denies complaints or  symptoms of Fatigue, Fever, Chills, Marked Weight Change. Eyes Denies complaints or symptoms of Dry Eyes, Vision Changes, Glasses / Contacts. Ear/Nose/Mouth/Throat Denies complaints or symptoms of Chronic sinus problems or rhinitis. Respiratory Denies complaints or symptoms of Chronic or frequent coughs, Shortness of Breath. Cardiovascular Denies complaints or symptoms of Chest pain. Gastrointestinal Denies complaints or symptoms of Frequent diarrhea, Nausea, Vomiting. Endocrine Denies complaints or symptoms of Heat/cold intolerance. Genitourinary Denies complaints or symptoms of Frequent urination. Integumentary (Skin) Complains or has symptoms of Wounds. Musculoskeletal Denies complaints or symptoms of Muscle Pain, Muscle Weakness. Neurologic Denies complaints or symptoms of Numbness/parasthesias. Psychiatric Denies complaints or symptoms of Claustrophobia, Suicidal. Objective Constitutional alert and oriented x 3. sitting or standing blood pressure is within target range for patient.. supine blood pressure is within target range for patient.. pulse regular and within target range for patient.Marland Kitchen respirations regular, non-labored and within target range for patient.Marland Kitchen temperature within target range for patient.. Well- nourished and well-hydrated in no acute distress. Vitals Time Taken: 1:45 PM, Height: 60 in, Source: Stated, Weight: 103 lbs, Source: Stated, BMI: 20.1, Temperature: 98.3 F, Pulse: 78 bpm, Respiratory Rate: 18 breaths/min, Blood Pressure: 103/72 mmHg. Eyes conjunctiva clear no eyelid edema noted. pupils equal round and reactive to light and accommodation. Ears, Nose, Mouth, and Throat no gross abnormality of ear auricles or external auditory canals. normal hearing noted during conversation. mucus membranes moist. Neck supple with no LAD noted in anterior or posterior cervical chain. not enlarged. Respiratory normal breathing without difficulty. clear to  auscultation bilaterally. Cardiovascular regular rate and rhythm with normal S1, S2. no bruits with no significant JVD. 2+ femoral pulses. 2+ dorsalis pedis/posterior tibialis pulses. no clubbing, cyanosis, significant edema, Gastrointestinal (GI) soft, non-tender, non-distended, +BS. no hepatosplenomegaly. no ventral hernia noted. Neurological cranial nerves 2-12 intact. Patient has normal sensation in the feet bilaterally to light touch. Psychiatric this  patient is able to make decisions and demonstrates good insight into disease process. Alert and Oriented x 3. pleasant and cooperative. General Notes: Left gluteal stage IV ulcer with visible tendon at base, with copious necrotic/devitalized tissue, some of it was removed using pickup and scalpel but this extends with undermining deeper into the wound with little healthy granulation tissue visible Sacral decub and midline with visible tendon at base, with devitalized tissue some of it was removed using scalpel Integumentary (Hair, Skin) normal hair distribution and pattern. skin pink, warm, dry. Wound #1 status is Open. Original cause of wound was Gradually Appeared. The wound is located on the Left Trochanter. The wound measures 4.5cm length x 4cm width x 1.2cm depth; 14.137cm^2 area and 16.965cm^3 volume. There is muscle, tendon, and Fat Layer (Subcutaneous Tissue) Exposed exposed. There is no tunneling noted, however, there is undermining starting at 12:00 and ending at 12:00 with a maximum distance of 2.7cm. There is a medium amount of serosanguineous drainage noted. The wound margin is well defined and not attached to the wound base. There is small (1-33%) red, pink granulation within the wound bed. There is a large (67-100%) amount of necrotic tissue within the wound bed including Eschar and Adherent Slough. Wound #2 status is Open. Original cause of wound was Gradually Appeared. The wound is located on the Sacrum. The wound measures 6cm  length x 3cm width x 1.3cm depth; 14.137cm^2 area and 18.378cm^3 volume. There is muscle, tendon, and Fat Layer (Subcutaneous Tissue) Exposed exposed. There is no tunneling noted, however, there is undermining starting at 6:00 and ending at 12:00 with a maximum distance of 3cm. There is a medium amount of serosanguineous drainage noted. The wound margin is well defined and not attached to the wound base. There is medium (34-66%) pink granulation within the wound bed. There is a medium (34-66%) amount of necrotic tissue within the wound bed including Adherent Slough. Assessment Active Problems ICD-10 Pressure-induced deep tissue damage of left buttock 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 Down syndrome, unspecified Procedures Wound #1 Pre-procedure diagnosis of Wound #1 is a Pressure Ulcer located on the Left Trochanter . There was a Excisional Skin/Subcutaneous Tissue Debridement with a total area of 18 sq cm performed by Cassandria Anger, MD. With the following instrument(s): Blade, Curette, Forceps, and Scissors to remove Viable and Non-Viable tissue/material. Material removed includes Subcutaneous Tissue and Slough and after achieving pain control using Other (benzocaine, 20%). No specimens were taken. A time out was conducted at 14:41, prior to the start of the procedure. A Minimum amount of bleeding was controlled with Pressure. The procedure was tolerated well with a pain level of 0 throughout and a pain level of 0 following the procedure. Post Debridement Measurements: 4.5cm length x 4cm width x 1.2cm depth; 16.965cm^3 volume. Post debridement Stage noted as Category/Stage IV. Character of Wound/Ulcer Post Debridement requires further debridement. Post procedure Diagnosis Wound #1: Same as Pre-Procedure Wound #2 Pre-procedure diagnosis of Wound #2 is a Pressure Ulcer located on the Sacrum . There was a Excisional Skin/Subcutaneous Tissue/Muscle Debridement with a total  area of 18 sq cm performed by Cassandria Anger, MD. With the following instrument(s): Blade, Curette, Forceps, and Scissors to remove Viable and Non-Viable tissue/material. Material removed includes T endon, Subcutaneous Tissue, and Slough after achieving pain control using Other (benzocaine, 20%). No specimens were taken. A time out was conducted at 14:41, prior to the start of the procedure. A Minimum amount of bleeding was controlled with Pressure. The  procedure was tolerated well with a pain level of 0 throughout and a pain level of 0 following the procedure. Post Debridement Measurements: 6cm length x 3cm width x 1.3cm depth; 18.378cm^3 volume. Post debridement Stage noted as Category/Stage IV. Character of Wound/Ulcer Post Debridement requires further debridement. Post procedure Diagnosis Wound #2: Same as Pre-Procedure Plan Follow-up Appointments: Return Appointment in 1 week. - Friday Dressing Change Frequency: Wound #1 Left Trochanter: Change dressing every day. Wound #2 Sacrum: Change dressing every day. Skin Barriers/Peri-Wound Care: Wound #1 Left Trochanter: Skin Prep Wound #2 Sacrum: Skin Prep Wound Cleansing: Wound #1 Left Trochanter: Clean wound with Wound Cleanser Wound #2 Sacrum: Clean wound with Wound Cleanser Primary Wound Dressing: Wound #1 Left Trochanter: Santyl Ointment - apply nickel thick. Lightly saline moisten gauze packing over santyl Wound #2 Sacrum: Santyl Ointment - apply nickel thick. Lightly saline moisten gauze packing over santyl Secondary Dressing: Wound #1 Left Trochanter: Foam Border - or ABD pad and tape Wound #2 Sacrum: Foam Border - or ABD pad and tape Off-Loading: Turn and reposition every 2 hours -Unfortunate Down syndrome patient with 2 deep tissue pressure injuries, using Santyl with daily dressing changes, continue the same and efforts at offloading especially the sacral area which is very challenging -I suspect that or debridement  may be necessary for the gluteal wound given the burden of devitalized tissue present -Patient may not be a candidate for wound VAC however at this time we would continue with daily dressing changes -Nutritional status is impaired and also detracts from wound healing -Return to clinic next week Electronic Signature(s) Signed: 04/03/2020 3:25:57 PM By: Cassandria AngerMadduri, Dace Denn MD, MBA Previous Signature: 04/03/2020 3:06:14 PM Version By: Cassandria AngerMadduri, Kenzleigh Sedam MD, MBA Entered By: Cassandria AngerMadduri, Derica Leiber on 04/03/2020 15:25:56 -------------------------------------------------------------------------------- HxROS Details Patient Name: Date of Service: Tracy Todd, Tracy Todd 04/03/2020 1:15 PM Medical Record Number: [id number] Patient Account Number: [id number] Date of Birth/Sex: Treating RN: Apr 22, 1966 (54 y.o. Melonie FloridaM) Epps, Carrie Primary Care Provider: Antony HasteBadger, Michael Other Clinician: Referring Provider: Treating Provider/Extender: Delphina CahillMadduri, Charo Philipp Badger, Michael Weeks in Treatment: 0 Unable to Obtain Patient History due to Altered Mental Status Constitutional Symptoms (General Health) Complaints and Symptoms: Negative for: Fatigue; Fever; Chills; Marked Weight Change Eyes Complaints and Symptoms: Negative for: Dry Eyes; Vision Changes; Glasses / Contacts Medical History: Negative for: Cataracts; Glaucoma; Optic Neuritis Ear/Nose/Mouth/Throat Complaints and Symptoms: Negative for: Chronic sinus problems or rhinitis Medical History: Negative for: Chronic sinus problems/congestion; Middle ear problems Respiratory Complaints and Symptoms: Negative for: Chronic or frequent coughs; Shortness of Breath Medical History: Negative for: Aspiration; Asthma; Chronic Obstructive Pulmonary Disease (COPD); Pneumothorax; Sleep Apnea; Tuberculosis Cardiovascular Complaints and Symptoms: Negative for: Chest pain Medical History: Negative for: Angina; Arrhythmia; Congestive Heart Failure; Coronary Artery Disease; Deep Vein  Thrombosis; Hypertension; Hypotension; Myocardial Infarction; Peripheral Arterial Disease; Peripheral Venous Disease; Phlebitis; Vasculitis Gastrointestinal Complaints and Symptoms: Negative for: Frequent diarrhea; Nausea; Vomiting Medical History: Negative for: Cirrhosis ; Colitis; Crohns; Hepatitis A; Hepatitis B; Hepatitis C Endocrine Complaints and Symptoms: Negative for: Heat/cold intolerance Medical History: Negative for: Type I Diabetes; Type II Diabetes Genitourinary Complaints and Symptoms: Negative for: Frequent urination Medical History: Negative for: End Stage Renal Disease Integumentary (Skin) Complaints and Symptoms: Positive for: Wounds Medical History: Negative for: History of Burn Musculoskeletal Complaints and Symptoms: Negative for: Muscle Pain; Muscle Weakness Medical History: Negative for: Gout; Rheumatoid Arthritis; Osteoarthritis; Osteomyelitis Neurologic Complaints and Symptoms: Negative for: Numbness/parasthesias Medical History: Negative for: Dementia; Neuropathy; Quadriplegia; Paraplegia; Seizure Disorder Psychiatric Complaints and Symptoms: Negative for: Claustrophobia; Suicidal Medical  History: Negative for: Anorexia/bulimia; Confinement Anxiety Hematologic/Lymphatic Medical History: Negative for: Anemia; Hemophilia; Human Immunodeficiency Virus; Lymphedema; Sickle Cell Disease Immunological Medical History: Negative for: Lupus Erythematosus; Raynauds; Scleroderma Oncologic Medical History: Negative for: Received Chemotherapy; Received Radiation Immunizations Pneumococcal Vaccine: Received Pneumococcal Vaccination: No Implantable Devices None Family and Social History Cancer: Yes - Maternal Grandparents; Diabetes: No; Heart Disease: No; Hereditary Spherocytosis: No; Hypertension: Yes - Mother,Father; Kidney Disease: Yes - Siblings; Lung Disease: No; Seizures: No; Stroke: No; Thyroid Problems: No; Tuberculosis: No; Never smoker; Alcohol  Use: Never; Drug Use: No History; Caffeine Use: Daily Electronic Signature(s) Signed: 04/03/2020 4:53:36 PM By: Cassandria Anger MD, MBA Signed: 04/17/2020 5:50:16 PM By: Yevonne Pax RN Entered By: Yevonne Pax on 04/03/2020 14:03:53 -------------------------------------------------------------------------------- SuperBill Details Patient Name: Date of Service: Tracy Todd, Tracy Todd 04/03/2020 Medical Record Number: [id number] Patient Account Number: [id number] Date of Birth/Sex: Treating RN: [birth year] (54 y.o. 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 Q90.9 Down syndrome, unspecified Facility Procedures CPT4 Code: 42706237 Description: 99213 - WOUND CARE VISIT-LEV 3 EST PT Modifier: 25 Quantity: 1 CPT4 Code: [id number] Description: 11042 - DEB SUBQ TISSUE 20 SQ CM/< ICD-10 Diagnosis Description L89.326 Pressure-induced deep tissue damage of left buttock Modifier: Quantity: 1 CPT4 Code: 61607371 Description: 11043 - DEB MUSC/FASCIA 20 SQ CM/< ICD-10 Diagnosis Description L89.156 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 Modifier: Quantity: 1 Physician Procedures : CPT4 Code Description Modifier [id number] 99204 - WC PHYS LEVEL 4 - NEW PT 25 ICD-10 Diagnosis Description L89.326 Pressure-induced deep tissue damage of left buttock Quantity: 1 : [id number] 11042 - WC PHYS SUBQ TISS 20 SQ CM 1 ICD-10 Diagnosis Description L89.326 Pressure-induced deep tissue damage of left buttock Quantity: : [id number] 11043 - WC PHYS DEBR MUSCLE/FASCIA 20 SQ CM 1 ICD-10 Diagnosis Description L89.156 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

## 2020-04-18 NOTE — Progress Notes (Signed)
KHRISTOPHER, KAPAUN ([id number]) Visit Report for 04/03/2020 Allergy List Details Patient Name: Date of Service: MEKHI, SONN 04/03/2020 1:15 PM Medical Record Number: [id number] Patient Account Number: [id number] Date of Birth/Sex: Treating RN: 1965/11/01 (53 y.o. Judie Petit) Yevonne Pax Primary Care Ashlan Dignan: Antony Haste Other Clinician: Referring Anastasha Ortez: Treating Eulia Hatcher/Extender: Delphina Cahill in Treatment: 0 Allergies Active Allergies penicillin Allergy Notes Electronic Signature(s) Signed: 04/17/2020 5:50:16 PM By: Yevonne Pax RN Entered By: Yevonne Pax on 04/03/2020 13:46:59 -------------------------------------------------------------------------------- Arrival Information Details Patient Name: Date of Service: EKANSH, SHERK 04/03/2020 1:15 PM Medical Record Number: [id number] Patient Account Number: [id number] Date of Birth/Sex: Treating RN: 1966-02-15 (54 y.o. Melonie [place] Primary Care Maykayla Highley: Antony Haste Other Clinician: Referring Roselyn Doby: Treating Janese Radabaugh/Extender: Delphina Cahill in Treatment: 0 Visit Information Patient Arrived: Wheel Chair Arrival Time: 13:44 Accompanied By: sister Transfer Assistance: None Patient Identification Verified: Yes Secondary Verification Process Completed: Yes Patient Requires Transmission-Based Precautions: No Patient Has Alerts: No Electronic Signature(s) Signed: 04/17/2020 5:50:16 PM By: Yevonne Pax RN Entered By: Yevonne Pax on 04/03/2020 13:44:26 -------------------------------------------------------------------------------- Clinic Level of Care Assessment Details Patient Name: Date of Service: MORIS, RATCHFORD 04/03/2020 1:15 PM Medical Record Number: [id number] Patient Account Number: [id number] Date of Birth/Sex: Treating RN: 16-Jul-1966 (54 y.o. [name] Primary Care Treyvonne Tata: Antony Haste Other Clinician: Referring Charmika Macdonnell: Treating Matheau Orona/Extender: Delphina Cahill in Treatment: 0 Clinic Level of Care Assessment Items TOOL 1 Quantity Score X- 1 0 Use when EandM and Procedure is performed on INITIAL visit ASSESSMENTS - Nursing Assessment / Reassessment X- 1 20 General Physical Exam (combine w/ comprehensive assessment (listed just below) when performed on new pt. evals) X- 1 25 Comprehensive Assessment (HX, ROS, Risk Assessments, Wounds Hx, etc.) ASSESSMENTS - Wound and Skin Assessment / Reassessment []  - 0 Dermatologic / Skin Assessment (not related to wound area) ASSESSMENTS - Ostomy and/or Continence Assessment and Care []  - 0 Incontinence Assessment and Management []  - 0 Ostomy Care Assessment and Management (repouching, etc.) PROCESS - Coordination of Care X - Simple Patient / Family Education for ongoing care 1 15 []  - 0 Complex (extensive) Patient / Family Education for ongoing care X- 1 10 Staff obtains , Records, T Results / Process Orders est X- 1 10 Staff telephones HHA, Nursing Homes / Clarify orders / etc []  - 0 Routine Transfer to another Facility (non-emergent condition) []  - 0 Routine Hospital Admission (non-emergent condition) X- 1 15 New Admissions / / Ordering NPWT Apligraf, etc. , []  - 0 Emergency Hospital Admission (emergent condition) PROCESS - Special Needs []  - 0 Pediatric / Minor Patient Management []  - 0 Isolation Patient Management []  - 0 Hearing / Language / Visual special needs []  - 0 Assessment of Community assistance (transportation, D/C planning, etc.) []  - 0 Additional assistance / Altered mentation []  - 0 Support Surface(s) Assessment (bed, cushion, seat, etc.) INTERVENTIONS - Miscellaneous []  - 0 External ear exam []  - 0 Patient Transfer (multiple staff / / Similar devices) []  - 0 Simple Staple / Suture removal (25 or less) []  - 0 Complex Staple / Suture removal (26 or more) []  - 0 Hypo/Hyperglycemic Management  (do not check if billed separately) []  - 0 Ankle / Brachial Index (ABI) - do not check if billed separately Has the patient been seen at the hospital within the last three years: Yes Total Score: 95 Level Of Care: New/Established - Level 3 Electronic Signature(s) Signed: 04/03/2020 4:53:28 PM By: Chiropractor  Entered By: Cherylin Mylar on 04/03/2020 14:46:00 -------------------------------------------------------------------------------- Encounter Discharge Information Details Patient Name: Date of Service: KHYLER, ESCHMANN 04/03/2020 1:15 PM Medical Record Number: [id number] Patient Account Number: [id number] Date of Birth/Sex: Treating RN: 01-03-1966 (54 y.o. Elizebeth Koller Primary Care Shalaine Payson: Antony Haste Other Clinician: Referring Zully Frane: Treating Kachina Niederer/Extender: Delphina Cahill in Treatment: 0 Encounter Discharge Information Items Post Procedure Vitals Discharge Condition: Stable Temperature (F): 98.3 Ambulatory Status: Wheelchair Pulse (bpm): 78 Discharge Destination: Home Respiratory Rate (breaths/min): 18 Transportation: Private Auto Blood Pressure (mmHg): 103/72 Accompanied By: sister and caregiver Schedule Follow-up Appointment: Yes Clinical Summary of Care: Patient Declined Electronic Signature(s) Signed: 04/03/2020 5:19:54 PM By: Zandra Abts RN, BSN Entered By: Zandra Abts on 04/03/2020 16:12:38 -------------------------------------------------------------------------------- Multi-Disciplinary Care Plan Details Patient Name: Date of Service: KEVONTAE, BURGOON 04/03/2020 1:15 PM Medical Record Number: [id number] Patient Account Number: [id number] Date of Birth/Sex: Treating RN: 07/18/1966 (54 y.o. [name] Primary Care Shriyans Kuenzi: Antony Haste Other Clinician: Referring Markeem Noreen: Treating Alzada Brazee/Extender: Delphina Cahill in Treatment: 0 Active Inactive Pain, Acute or  Chronic Nursing Diagnoses: Pain, acute or chronic: actual or potential Goals: Patient/caregiver will verbalize adequate pain control between visits Date Initiated: 04/03/2020 Target Resolution Date: 05/08/2020 Goal Status: Active Interventions: Provide education on pain management Notes: Pressure Nursing Diagnoses: Knowledge deficit related to management of pressures ulcers Goals: Patient/caregiver will verbalize risk factors for pressure ulcer development Date Initiated: 04/03/2020 Target Resolution Date: 05/08/2020 Goal Status: Active Interventions: Provide education on pressure ulcers Notes: Electronic Signature(s) Signed: 04/03/2020 4:53:28 PM By: Cherylin Mylar Entered By: Cherylin Mylar on 04/03/2020 14:43:33 -------------------------------------------------------------------------------- Pain Assessment Details Patient Name: Date of Service: NHAN, QUALLEY 04/03/2020 1:15 PM Medical Record Number: [id number] Patient Account Number: [id number] Date of Birth/Sex: Treating RN: August 30, 1965 (54 y.o. Melonie [place] Primary Care Aislee Landgren: Antony Haste Other Clinician: Referring Colvin Blatt: Treating Katarzyna Wolven/Extender: Delphina Cahill in Treatment: 0 Active Problems Location of Pain Severity and Description of Pain Patient Has Paino No Site Locations Pain Management and Medication Current Pain Management: Electronic Signature(s) Signed: 04/17/2020 5:50:16 PM By: Yevonne Pax RN Entered By: Yevonne Pax on 04/03/2020 14:25:05 -------------------------------------------------------------------------------- Patient/Caregiver Education Details Patient Name: Date of Service: Ernesta Amble 8/13/2021andnbsp1:15 PM Medical Record Number: [id number] Patient Account Number: [id number] Date of Birth/Gender: Treating RN: 07/16/1966 (54 y.o. [name] Primary Care Physician: Antony Haste Other Clinician: Referring Physician: Treating  Physician/Extender: Delphina Cahill in Treatment: 0 Education Assessment Education Provided To: Patient Education Topics Provided Pain: Handouts: A Guide to Pain Control Methods: Explain/Verbal Responses: State content correctly Pressure: Handouts: Pressure Ulcers: Care and Offloading Methods: Explain/Verbal Responses: State content correctly Electronic Signature(s) Signed: 04/03/2020 4:53:28 PM By: Cherylin Mylar Entered By: Cherylin Mylar on 04/03/2020 14:43:47 -------------------------------------------------------------------------------- Wound Assessment Details Patient Name: Date of Service: OZ, GAMMEL 04/03/2020 1:15 PM Medical Record Number: [id number] Patient Account Number: [id number] Date of Birth/Sex: Treating RN: [birth year] (54 y.o. Judie Petit) Yevonne Pax Primary Care Bertis Hustead: Antony Haste Other Clinician: Referring Davidmichael Zarazua: Treating Beola Vasallo/Extender: Delphina Cahill in Treatment: 0 Wound Status Wound Number: 1 Primary Etiology: Pressure Ulcer Wound Location: Left Trochanter Wound Status: Open Wounding Event: Gradually Appeared Date Acquired: 12/21/2019 Weeks Of Treatment: 0 Clustered Wound: No Photos Photo Uploaded By: Benjaman Kindler on 04/06/2020 11:27:50 Wound Measurements Length: (cm) 4.5 Width: (cm) 4 Depth: (cm) 1.2 Area: (cm) 14.137 Volume: (cm) 16.965 % Reduction in Area: 0% % Reduction in Volume: 0% Epithelialization: None Tunneling: No Undermining: Yes Starting Position (o'clock): 12 Ending Position (  o'clock): 12 Maximum Distance: (cm) 2.7 Wound Description Classification: Category/Stage IV Wound Margin: Well defined, not attached Exudate Amount: Medium Exudate Type: Serosanguineous Exudate Color: red, brown Foul Odor After Cleansing: No Slough/Fibrino Yes Wound Bed Granulation Amount: Small (1-33%) Exposed Structure Granulation Quality: Red, Pink Fascia Exposed: No Necrotic  Amount: Large (67-100%) Fat Layer (Subcutaneous Tissue) Exposed: Yes Necrotic Quality: Eschar, Adherent Slough Tendon Exposed: Yes Muscle Exposed: Yes Necrosis of Muscle: No Joint Exposed: No Bone Exposed: No Electronic Signature(s) Signed: 04/03/2020 4:53:28 PM By: Cherylin Mylar Signed: 04/17/2020 5:50:16 PM By: Yevonne Pax RN Entered By: Cherylin Mylar on 04/03/2020 14:39:48 -------------------------------------------------------------------------------- Wound Assessment Details Patient Name: Date of Service: JOHNGABRIEL, VERDE 04/03/2020 1:15 PM Medical Record Number: [id number] Patient Account Number: [id number] Date of Birth/Sex: Treating RN: 01/30/1966 (54 y.o. Melonie [place] Primary Care Ival Basquez: Antony Haste Other Clinician: Referring Cuca Benassi: Treating Brylynn Hanssen/Extender: Delphina Cahill in Treatment: 0 Wound Status Wound Number: 2 Primary Etiology: Pressure Ulcer Wound Location: Sacrum Wound Status: Open Wounding Event: Gradually Appeared Date Acquired: 12/21/2019 Weeks Of Treatment: 0 Clustered Wound: No Photos Photo Uploaded By: Benjaman Kindler on 04/06/2020 11:27:50 Wound Measurements Length: (cm) 6 Width: (cm) 3 Depth: (cm) 1.3 Area: (cm) 14.137 Volume: (cm) 18.378 % Reduction in Area: 0% % Reduction in Volume: 0% Epithelialization: None Tunneling: No Undermining: Yes Starting Position (o'clock): 6 Ending Position (o'clock): 12 Maximum Distance: (cm) 3 Wound Description Classification: Category/Stage IV Wound Margin: Well defined, not attached Exudate Amount: Medium Exudate Type: Serosanguineous Exudate Color: red, brown Foul Odor After Cleansing: No Slough/Fibrino Yes Wound Bed Granulation Amount: Medium (34-66%) Exposed Structure Granulation Quality: Pink Fascia Exposed: No Necrotic Amount: Medium (34-66%) Fat Layer (Subcutaneous Tissue) Exposed: Yes Necrotic Quality: Adherent Slough Tendon Exposed: Yes Muscle  Exposed: Yes Necrosis of Muscle: No Joint Exposed: No Bone Exposed: No Electronic Signature(s) Signed: 04/03/2020 4:53:28 PM By: Cherylin Mylar Signed: 04/17/2020 5:50:16 PM By: Yevonne Pax RN Entered By: Cherylin Mylar on 04/03/2020 14:40:21 -------------------------------------------------------------------------------- Vitals Details Patient Name: Date of Service: ROYSTON, BEKELE 04/03/2020 1:15 PM Medical Record Number: [id number] Patient Account Number: [id number] Date of Birth/Sex: Treating RN: 17-Apr-1966 (54 y.o. Melonie [place] Primary Care Sophiya Morello: Antony Haste Other Clinician: Referring Ladarrell Cornwall: Treating Camesha Farooq/Extender: Delphina Cahill in Treatment: 0 Vital Signs Time Taken: 13:45 Temperature (F): 98.3 Height (in): 60 Pulse (bpm): 78 Source: Stated Respiratory Rate (breaths/min): 18 Weight (lbs): 103 Blood Pressure (mmHg): 103/72 Source: Stated Reference Range: 80 - 120 mg / dl Body Mass Index (BMI): 20.1 Electronic Signature(s) Signed: 04/17/2020 5:50:16 PM By: Yevonne Pax RN Entered By: Yevonne Pax on 04/03/2020 13:46:12

## 2020-04-18 NOTE — Progress Notes (Signed)
Tracy Todd, Tracy Todd ([id number]) Visit Report for 04/03/2020 Abuse/Suicide Risk Screen Details Patient Name: Date of Service: Tracy Todd, Tracy Todd 04/03/2020 1:15 PM Medical Record Number: [id number] Patient Account Number: [id number] Date of Birth/Sex: Treating RN: 1966/03/17 (54 y.o. Judie Petit) Yevonne Pax Primary Care Makynleigh Breslin: Antony Haste Other Clinician: Referring Taela Charbonneau: Treating Luciann Gossett/Extender: Delphina Cahill in Treatment: 0 Abuse/Suicide Risk Screen Items Answer ABUSE RISK SCREEN: Has anyone close to you tried to hurt or harm you recentlyo No Do you feel uncomfortable with anyone in your familyo No Has anyone forced you do things that you didnt want to doo No Electronic Signature(s) Signed: 04/17/2020 5:50:16 PM By: Yevonne Pax RN Entered By: Yevonne Pax on 04/03/2020 14:04:04 -------------------------------------------------------------------------------- Activities of Daily Living Details Patient Name: Date of Service: Tracy Todd, Tracy Todd 04/03/2020 1:15 PM Medical Record Number: [id number] Patient Account Number: [id number] Date of Birth/Sex: Treating RN: 10/07/1965 (54 y.o. Judie Petit) Yevonne Pax Primary Care Appolonia Ackert: Antony Haste Other Clinician: Referring Shanyia Stines: Treating Avion Patella/Extender: Delphina Cahill in Treatment: 0 Activities of Daily Living Items Answer Activities of Daily Living (Please select one for each item) Drive Automobile Not Able T Medications ake Not Able Use T elephone Not Able Care for Appearance Not Able Use T oilet Not Able Mady Haagensen / Shower Not Able Dress Self Not Able Feed Self Not Able Walk Not Able Get In / Out Bed Not Able Housework Not Able Prepare Meals Not Able Handle Money Not Able Shop for Self Not Able Electronic Signature(s) Signed: 04/17/2020 5:50:16 PM By: Yevonne Pax RN Entered By: Yevonne Pax on 04/03/2020  14:05:12 -------------------------------------------------------------------------------- Education Screening Details Patient Name: Date of Service: Tracy Todd, Tracy Todd 04/03/2020 1:15 PM Medical Record Number: [id number] Patient Account Number: [id number] Date of Birth/Sex: Treating RN: 1965-09-11 (54 y.o. Judie Petit) Yevonne Pax Primary Care Doneen Ollinger: Antony Haste Other Clinician: Referring Dasja Brase: Treating Kevonna Nolte/Extender: Delphina Cahill in Treatment: 0 Primary Learner Assessed: Caregiver downs Learning Preferences/Education Level/Primary Language Preferred Language: English Cognitive Barrier Language Barrier: No Translator Needed: No Memory Deficit: No Emotional Barrier: No Cultural/Religious Beliefs Affecting Medical Care: No Physical Barrier Impaired Vision: No Impaired Hearing: No Decreased Hand dexterity: No Knowledge/Comprehension Knowledge Level: Medium Comprehension Level: High Ability to understand written instructions: High Ability to understand verbal instructions: High Motivation Anxiety Level: Anxious Cooperation: Cooperative Education Importance: Acknowledges Need Interest in Health Problems: Asks Questions Perception: Coherent Willingness to Engage in Self-Management Medium Activities: Readiness to Engage in Self-Management Medium Activities: Electronic Signature(s) Signed: 04/17/2020 5:50:16 PM By: Yevonne Pax RN Entered By: Yevonne Pax on 04/03/2020 14:05:48 -------------------------------------------------------------------------------- Fall Risk Assessment Details Patient Name: Date of Service: Tracy Todd, Tracy Todd 04/03/2020 1:15 PM Medical Record Number: [id number] Patient Account Number: [id number] Date of Birth/Sex: Treating RN: 02-28-1966 (54 y.o. Judie Petit) Yevonne Pax Primary Care Nare Gaspari: Antony Haste Other Clinician: Referring Abeni Finchum: Treating Tenia Goh/Extender: Delphina Cahill in Treatment: 0 Fall  Risk Assessment Items Have you had 2 or more falls in the last 12 monthso 0 No Have you had any fall that resulted in injury in the last 12 monthso 0 No FALLS RISK SCREEN History of falling - immediate or within 3 months 0 No Secondary diagnosis (Do you have 2 or more medical diagnoseso) 0 No Ambulatory aid None/bed rest/wheelchair/nurse 0 No Crutches/cane/walker 0 No Furniture 0 No Intravenous therapy Access/Saline/Heparin Lock 0 No Gait/Transferring Normal/ bed rest/ wheelchair 0 No Weak (short steps with or without shuffle, stooped but able to lift head while walking, may seek 0 No support from furniture) Impaired (short steps with  shuffle, may have difficulty arising from chair, head down, impaired 0 No balance) Mental Status Oriented to own ability 0 No Electronic Signature(s) Signed: 04/17/2020 5:50:16 PM By: Yevonne Pax RN Entered By: Yevonne Pax on 04/03/2020 14:06:18 -------------------------------------------------------------------------------- Foot Assessment Details Patient Name: Date of Service: Tracy Todd, Tracy Todd 04/03/2020 1:15 PM Medical Record Number: [id number] Patient Account Number: [id number] Date of Birth/Sex: Treating RN: 1966/07/22 (54 y.o. Judie Petit) Yevonne Pax Primary Care Abbigayle Toole: Antony Haste Other Clinician: Referring [name] [name]: Treating Lawanda Holzheimer/Extender: Delphina Cahill in Treatment: 0 Foot Assessment Items [x]  Unable to perform due to altered mental status Site Locations + = Sensation present, - = Sensation absent, C = Callus, U = Ulcer R = Redness, W = Warmth, M = Maceration, PU = Pre-ulcerative lesion F = Fissure, S = Swelling, D = Dryness Assessment Right: Left: Other Deformity: No No Prior Foot Ulcer: No No Prior Amputation: No No Charcot Joint: No No Ambulatory Status: Gait: Electronic Signature(s) Signed: 04/17/2020 5:50:16 PM By: 04/19/2020 RN Entered By: Yevonne Pax on 04/03/2020  14:58:43 -------------------------------------------------------------------------------- Nutrition Risk Screening Details Patient Name: Date of Service: Tracy Todd, Tracy Todd 04/03/2020 1:15 PM Medical Record Number: 04/05/2020 Patient Account Number: [id number] Date of Birth/Sex: Treating RN: 1966/02/27 (54 y.o. 09-26-2005) Judie Petit Primary Care Illana Nolting: Yevonne Pax Other Clinician: Referring Evann Koelzer: Treating Ariyana Faw/Extender: Antony Haste in Treatment: 0 Height (in): 60 Weight (lbs): 103 Body Mass Index (BMI): 20.1 Nutrition Risk Screening Items Score Screening NUTRITION RISK SCREEN: I have an illness or condition that made me change the kind and/or amount of food I eat 0 No I eat fewer than two meals per day 0 No I eat few fruits and vegetables, or milk products 0 No I have three or more drinks of beer, liquor or wine almost every day 0 No I have tooth or mouth problems that make it hard for me to eat 0 No I don't always have enough money to buy the food I need 0 No I eat alone most of the time 0 No I take three or more different prescribed or over-the-counter drugs a day 1 Yes Without wanting to, I have lost or gained 10 pounds in the last six months 2 Yes I am not always physically able to shop, cook and/or feed myself 2 Yes Nutrition Protocols Good Risk Protocol Moderate Risk Protocol High Risk Proctocol 0 Provide education on nutrition Risk Level: Moderate Risk Score: 5 Electronic Signature(s) Signed: 04/17/2020 5:50:16 PM By: 04/19/2020 RN Entered By: Yevonne Pax on 04/03/2020 14:06:42

## 2020-04-19 NOTE — Progress Notes (Signed)
Tracy, Todd ([id number]) Visit Report for 04/17/2020 Debridement Details Patient Name: Date of Service: Tracy Todd, Tracy Todd 04/17/2020 12:30 PM Medical Record Number: [id number] Patient Account Number: [id number] Date of Birth/Sex: Treating RN: 10-Feb-1966 (54 y.o. [name] Primary Care Provider: Antony Haste Other Clinician: Referring Provider: Treating Provider/Extender: Murtis Sink in Treatment: 2 Debridement Performed for Assessment: Wound #1 Left Trochanter Performed By: Physician Maxwell Caul., MD Debridement Type: Chemical/Enzymatic/Mechanical Agent Used: Santyl Level of Consciousness (Pre-procedure): Awake and Alert Pre-procedure Verification/Time Out No Taken: Bleeding: None Response to Treatment: Procedure was tolerated well Level of Consciousness (Post- Awake and Alert procedure): Post Debridement Measurements of Total Wound Length: (cm) 3.5 Stage: Category/Stage IV Width: (cm) 2.5 Depth: (cm) 0.8 Volume: (cm) 5.498 Character of Wound/Ulcer Post Debridement: Requires Further Debridement Post Procedure Diagnosis Same as Pre-procedure Electronic Signature(s) Signed: 04/17/2020 5:56:33 PM By: Cherylin Mylar Signed: 04/19/2020 7:39:18 AM By: Baltazar Najjar MD Entered By: Cherylin Mylar on 04/17/2020 14:24:57 -------------------------------------------------------------------------------- Debridement Details Patient Name: Date of Service: Tracy, Todd 04/17/2020 12:30 PM Medical Record Number: [id number] Patient Account Number: [id number] Date of Birth/Sex: Treating RN: Apr 02, 1966 (54 y.o. [name] Primary Care Provider: Antony Haste Other Clinician: Referring Provider: Treating Provider/Extender: Murtis Sink in Treatment: 2 Debridement Performed for Assessment: Wound #2 Sacrum Performed By: Physician Maxwell Caul., MD Debridement Type: Chemical/Enzymatic/Mechanical Agent Used:  Santyl Level of Consciousness (Pre-procedure): Awake and Alert Pre-procedure Verification/Time Out No Taken: Bleeding: None Response to Treatment: Procedure was tolerated well Level of Consciousness (Post- Awake and Alert procedure): Post Debridement Measurements of Total Wound Length: (cm) 5 Stage: Category/Stage IV Width: (cm) 3.5 Depth: (cm) 0.8 Volume: (cm) 10.996 Character of Wound/Ulcer Post Debridement: Requires Further Debridement Post Procedure Diagnosis Same as Pre-procedure Electronic Signature(s) Signed: 04/17/2020 5:56:33 PM By: Cherylin Mylar Signed: 04/19/2020 7:39:18 AM By: Baltazar Najjar MD Entered By: Cherylin Mylar on 04/17/2020 14:25:18 -------------------------------------------------------------------------------- HPI Details Patient Name: Date of Service: Tracy, Todd 04/17/2020 12:30 PM Medical Record Number: [id number] Patient Account Number: [id number] Date of Birth/Sex: Treating RN: 1966/03/09 (54 y.o. [name] Primary Care Provider: Antony Haste Other Clinician: Referring Provider: Treating Provider/Extender: Murtis Sink in Treatment: 2 History of Present Illness HPI Description: 54 year old Down syndrome patient who unfortunately developed 2 deep tissue pressure wounds while recuperating at skilled nursing facility in May, patient was admitted to [facility] health for syncopal event in April and after hospitalization was discharged to skilled nursing facility, his work-up included Doppler studies that showed right internal carotid artery stenosis for which she was placed on Plavix. After being at the skilled nursing facility patient developed 2 wounds and was admitted in June for an 8-day hospital stay for wound care. 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 He had a pelvic CT that showed ulceration extending along the right buttock  into the medial margin of the gluteus maximus muscle without signs of osteomyelitis. Patient had wound care with bedside debridement and discharge to different facility at discharge. He is currently at a group home where he is getting home health to assist with wound care especially with central and daily dressing changes. And referred to the wound clinic for further care. Patient is nonverbal, and cannot follow instructions, sister and caregiver are present at bedside. According to them the wounds look "better" than before considering that he had black eschar covering the wounds and had some debridement  performed that remove this layer. Patient's currently on iron supplements, Plavix, nutritional supplements, lipid-lowering agent. 04/10/2020; this is a patient with 2 stage IV wounds in the lower sacrum and the left greater trochanter. He required debridement last week. They have been using Santyl but apparently they ran out of this and Medicaid would not cover a represcription. The patient has Down syndrome. In talking with his sister he is no longer verbal and nonambulatory this is a big deterioration over the last year. She states that she thinks this was a result of the first shot of pneumonia during a vaccine last January although I think he was having some problem before that. His caregiver is also here. Apparently eats and drinks well they crush his medications. They have an eggcrate pressure relief surface 8/27; left greater trochanter and lower sacrum both stage IV pressure ulcers in this very frail man with advanced Down syndrome likely underlying Alzheimer's disease. He apparently is eating and drinking well per his caregiver. They have some form of pressure relief bed. I put him on antibiotics last week because of erythema around the greater trochanter but I did not culture. He has a new superficial area over his left elbow Electronic Signature(s) Signed: 04/19/2020 7:39:18 AM By: Baltazar Najjar MD Entered By: Baltazar Najjar on 04/17/2020 14:06:28 -------------------------------------------------------------------------------- Physical Exam Details Patient Name: Date of Service: Tracy, Todd 04/17/2020 12:30 PM Medical Record Number: [id number] Patient Account Number: [id number] Date of Birth/Sex: Treating RN: 1965-12-18 (54 y.o. [name] Primary Care Provider: Antony Haste Other Clinician: Referring Provider: Treating Provider/Extender: Murtis Sink in Treatment: 2 Constitutional Patient is hypotensive. But does not look septic. Pulse regular and within target range for patient.Marland Kitchen Respirations regular, non-labored and within target range.. Temperature is normal and within the target range for the patient.Marland Kitchen Appears in no distress. Psychiatric Restless and agitated. Notes Wound exam; the patient has a large wound over the left greater trochanter. This has an exposed tendon this week and likely bone underneath it. He still has the erythema around the sacral wound although this looks better. No exposed bone in this area. He has a superficial area on the left elbow which I think is probably a pressure ulcer from pressure and friction Electronic Signature(s) Signed: 04/19/2020 7:39:18 AM By: Baltazar Najjar MD Entered By: Baltazar Najjar on 04/17/2020 14:07:45 -------------------------------------------------------------------------------- Physician Orders Details Patient Name: Date of Service: PETRA, SARGEANT 04/17/2020 12:30 PM Medical Record Number: [id number] Patient Account Number: [id number] Date of Birth/Sex: Treating RN: 30-Jun-1966 (54 y.o. [name] Primary Care Provider: Antony Haste Other Clinician: Referring Provider: Treating Provider/Extender: Murtis Sink in Treatment: 2 Verbal / Phone Orders: No Diagnosis Coding ICD-10 Coding Code Description Q90.9 Down syndrome,  unspecified L89.224 Pressure ulcer of left hip, stage 4 L89.154 Pressure ulcer of sacral region, stage 4 L03.116 Cellulitis of left lower limb Follow-up Appointments ppointment in 2 weeks. - Staff transfers Return A Other: - take antibiotics as prescribed Dressing Change Frequency Wound #1 Left Trochanter Change dressing every day. Wound #2 Sacrum Change dressing every day. Wound #3 Right Elbow Change Dressing every other day. Skin Barriers/Peri-Wound Care Wound #1 Left Trochanter Skin Prep Wound #2 Sacrum Skin Prep Wound Cleansing Wound #1 Left Trochanter Clean wound with Wound Cleanser Wound #2 Sacrum Clean wound with Wound Cleanser Wound #3 Right Elbow Clean wound with Wound Cleanser Primary Wound Dressing Wound #1 Left Trochanter Santyl Ointment - lightly moistened saline gauze packing Wound #2 Sacrum Santyl Ointment -  lightly moistened saline gauze packing Wound #3 Right Elbow Xeroform Secondary Dressing Wound #1 Left Trochanter Foam Border - or ABD pad and tape Wound #2 Sacrum Foam Border - or ABD pad and tape Wound #3 Right Elbow Foam Border Off-Loading Turn and reposition every 2 hours Home Health Continue Home Health skilled nursing for wound care. - [facility] Electronic Signature(s) Signed: 04/17/2020 5:56:33 PM By: Cherylin Mylar Signed: 04/19/2020 7:39:18 AM By: Baltazar Najjar MD Entered By: Cherylin Mylar on 04/17/2020 13:43:34 -------------------------------------------------------------------------------- Problem List Details Patient Name: Date of Service: XABI, WITTLER 04/17/2020 12:30 PM Medical Record Number: [id number] Patient Account Number: [id number] Date of Birth/Sex: Treating RN: Nov 13, 1965 (54 y.o. [name] Primary Care Provider: Antony Haste Other Clinician: Referring Provider: Treating Provider/Extender: Murtis Sink in Treatment: 2 Active Problems ICD-10 Encounter Code Description  Active Date MDM Diagnosis Q90.9 Down syndrome, unspecified 04/03/2020 No Yes L89.224 Pressure ulcer of left hip, stage 4 04/10/2020 No Yes L89.154 Pressure ulcer of sacral region, stage 4 04/10/2020 No Yes L03.116 Cellulitis of left lower limb 04/10/2020 No Yes L89.012 Pressure ulcer of right elbow, stage 2 04/17/2020 No Yes Inactive Problems Resolved Problems Electronic Signature(s) Signed: 04/19/2020 7:39:18 AM By: Baltazar Najjar MD Entered By: Baltazar Najjar on 04/17/2020 14:05:21 -------------------------------------------------------------------------------- Progress Note Details Patient Name: Date of Service: LASHAUN, KRAPF 04/17/2020 12:30 PM Medical Record Number: [id number] Patient Account Number: [id number] Date of Birth/Sex: Treating RN: 18-Aug-1966 (54 y.o. [name] Primary Care Provider: Antony Haste Other Clinician: Referring Provider: Treating Provider/Extender: Murtis Sink in Treatment: 2 Subjective History of Present Illness (HPI) 54 year old Down syndrome patient who unfortunately developed 2 deep tissue pressure wounds while recuperating at skilled nursing facility in May, patient was admitted to [facility] for syncopal event in April and after hospitalization was discharged to skilled nursing facility, his work-up included Doppler studies that showed right internal carotid artery stenosis for which she was placed on Plavix. After being at the skilled nursing facility patient developed 2 wounds and was admitted in June for an 8-day hospital stay for wound care. 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 He had a pelvic CT that showed ulceration extending along the right buttock into the medial margin of the gluteus maximus muscle without signs of osteomyelitis. Patient had wound care with bedside debridement and discharge to different facility at  discharge. He is currently at a group home where he is getting home health to assist with wound care especially with central and daily dressing changes. And referred to the wound clinic for further care. Patient is nonverbal, and cannot follow instructions, sister and caregiver are present at bedside. According to them the wounds look "better" than before considering that he had black eschar covering the wounds and had some debridement performed that remove this layer. Patient's currently on iron supplements, Plavix, nutritional supplements, lipid-lowering agent. 04/10/2020; this is a patient with 2 stage IV wounds in the lower sacrum and the left greater trochanter. He required debridement last week. They have been using Santyl but apparently they ran out of this and Medicaid would not cover a represcription. The patient has Down syndrome. In talking with his sister he is no longer verbal and nonambulatory this is a big deterioration over the last year. She states that she thinks this was a result of the first shot of pneumonia during a vaccine last January although I think he  was having some problem before that. His caregiver is also here. Apparently eats and drinks well they crush his medications. They have an eggcrate pressure relief surface 8/27; left greater trochanter and lower sacrum both stage IV pressure ulcers in this very frail man with advanced Down syndrome likely underlying Alzheimer's disease. He apparently is eating and drinking well per his caregiver. They have some form of pressure relief bed. I put him on antibiotics last week because of erythema around the greater trochanter but I did not culture. He has a new superficial area over his left elbow Objective Constitutional Patient is hypotensive. But does not look septic. Pulse regular and within target range for patient.Marland Kitchen Respirations regular, non-labored and within target range.. Temperature is normal and within the target range  for the patient.Marland Kitchen Appears in no distress. Vitals Time Taken: 1:07 PM, Height: 60 in, Weight: 103 lbs, BMI: 20.1, Temperature: 98.1 F, Pulse: 87 bpm, Respiratory Rate: 18 breaths/min, Blood Pressure: 94/61 mmHg. Psychiatric Restless and agitated. General Notes: Wound exam; the patient has a large wound over the left greater trochanter. This has an exposed tendon this week and likely bone underneath it. He still has the erythema around the sacral wound although this looks better. No exposed bone in this area. ooHe has a superficial area on the left elbow which I think is probably a pressure ulcer from pressure and friction Integumentary (Hair, Skin) Wound #1 status is Open. Original cause of wound was Gradually Appeared. The wound is located on the Left Trochanter. The wound measures 3.5cm length x 2.5cm width x 0.8cm depth; 6.872cm^2 area and 5.498cm^3 volume. There is muscle, tendon, and Fat Layer (Subcutaneous Tissue) exposed. There is no tunneling noted, however, there is undermining starting at 12:00 and ending at 12:00 with a maximum distance of 2.5cm. There is a medium amount of serosanguineous drainage noted. The wound margin is well defined and not attached to the wound base. There is small (1-33%) red, pink granulation within the wound bed. There is a large (67-100%) amount of necrotic tissue within the wound bed including Eschar and Adherent Slough. Wound #2 status is Open. Original cause of wound was Gradually Appeared. The wound is located on the Sacrum. The wound measures 5cm length x 3.5cm width x 0.8cm depth; 13.744cm^2 area and 10.996cm^3 volume. There is muscle, tendon, and Fat Layer (Subcutaneous Tissue) exposed. There is no tunneling noted, however, there is undermining starting at 6:00 and ending at 9:00 with a maximum distance of 3.1cm. There is a medium amount of serosanguineous drainage noted. The wound margin is well defined and not attached to the wound base. There is  medium (34-66%) pink granulation within the wound bed. There is a medium (34-66%) amount of necrotic tissue within the wound bed including Eschar and Adherent Slough. Wound #3 status is Open. Original cause of wound was Shear/Friction. The wound is located on the Right Elbow. The wound measures 3cm length x 2cm width x 0.1cm depth; 4.712cm^2 area and 0.471cm^3 volume. Assessment Active Problems ICD-10 Down syndrome, unspecified Pressure ulcer of left hip, stage 4 Pressure ulcer of sacral region, stage 4 Cellulitis of left lower limb Pressure ulcer of right elbow, stage 2 Plan Follow-up Appointments: Return Appointment in 2 weeks. - Staff transfers Other: - take antibiotics as prescribed Dressing Change Frequency: Wound #1 Left Trochanter: Change dressing every day. Wound #2 Sacrum: Change dressing every day. Wound #3 Right Elbow: Change Dressing every other day. Skin Barriers/Peri-Wound Care: Wound #1 Left Trochanter: Skin Prep Wound #  2 Sacrum: Skin Prep Wound Cleansing: Wound #1 Left Trochanter: Clean wound with Wound Cleanser Wound #2 Sacrum: Clean wound with Wound Cleanser Wound #3 Right Elbow: Clean wound with Wound Cleanser Primary Wound Dressing: Wound #1 Left Trochanter: Santyl Ointment - lightly moistened saline gauze packing Wound #2 Sacrum: Santyl Ointment - lightly moistened saline gauze packing Wound #3 Right Elbow: Xeroform Secondary Dressing: Wound #1 Left Trochanter: Foam Border - or ABD pad and tape Wound #2 Sacrum: Foam Border - or ABD pad and tape Wound #3 Right Elbow: Foam Border Off-Loading: Turn and reposition every 2 hours Home Health: Continue Home Health skilled nursing for wound care. - [facility] 1. Apparently they did receive Santyl so we change this to Santyl to both wound areas with backing wet to dry 2. Imaging of these wounds would be very difficult especially advanced imaging. 3. Considering lab work if we can get this done 4. I  cannot help but wonder about expectations of healing here although I did not get into this discussion Electronic Signature(s) Signed: 04/19/2020 7:39:18 AM By: Baltazar Najjarobson, Kao Berkheimer MD Entered By: Baltazar Najjarobson, Airik Goodlin on 04/17/2020 14:08:37 -------------------------------------------------------------------------------- SuperBill Details Patient Name: Date of Service: Ernesta AmbleLLER, Embry 04/17/2020 Medical Record Number: [id number] Patient Account Number: [id number] Date of Birth/Sex: Treating RN: Jul 23, 1966 (54 y.o. Katherina RightM) Dwiggins, Shannon Primary Care Provider: Antony HasteBadger, Makeisha Jentsch Other Clinician: Referring Provider: Treating Provider/Extender: Murtis Sinkobson, Silas Muff Badger, Fionna Merriott Weeks in Treatment: 2 Diagnosis Coding ICD-10 Codes Code Description Q90.9 Down syndrome, unspecified L89.224 Pressure ulcer of left hip, stage 4 L89.154 Pressure ulcer of sacral region, stage 4 L03.116 Cellulitis of left lower limb L89.012 Pressure ulcer of right elbow, stage 2 Facility Procedures CPT4 Code: [id number] Description: [phone] - DEBRIDE W/O ANES NON SELECT Modifier: Quantity: 1 Physician Procedures : CPT4 Code Description Modifier [id number] - WC PHYS LEVEL 3 - EST PT ICD-10 Diagnosis Description L89.224 Pressure ulcer of left hip, stage 4 L89.154 Pressure ulcer of sacral region, stage 4 L89.012 Pressure ulcer of right elbow, stage 2 Quantity: 1 Electronic Signature(s) Signed: 04/17/2020 5:56:33 PM By: Cherylin Mylarwiggins, Shannon Signed: 04/19/2020 7:39:18 AM By: Baltazar Najjarobson, Storm Sovine MD Entered By: Cherylin Mylarwiggins, Shannon on 04/17/2020 14:28:03

## 2020-04-20 ENCOUNTER — Encounter (HOSPITAL_BASED_OUTPATIENT_CLINIC_OR_DEPARTMENT_OTHER): Payer: Medicare Other | Admitting: Internal Medicine

## 2020-04-22 NOTE — Progress Notes (Signed)
Tracy Todd, Tracy Todd ([id number]) Visit Report for 04/17/2020 Arrival Information Details Patient Name: Date of Service: Tracy Todd, [name] 04/17/2020 12:30 PM Medical Record Number: [id number] Patient Account Number: [id number] Date of Birth/Sex: Treating RN: [birth year] (54 y.o. Katherina RightM) Dwiggins, Shannon Primary Care Taleia Sadowski: Antony HasteBadger, Michael Other Clinician: Referring Hooper Petteway: Treating Shanterria Franta/Extender: Murtis Sinkobson, Michael Badger, Michael Weeks in Treatment: 2 Visit Information History Since Last Visit Added or deleted any medications: No Patient Arrived: Wheel Chair Any new allergies or adverse reactions: No Arrival Time: 13:06 Had a fall or experienced change in No Accompanied By: caregiver/sister activities of daily living that may affect Transfer Assistance: None risk of falls: Patient Identification Verified: Yes Signs or symptoms of abuse/neglect since last visito No Secondary Verification Process Completed: Yes Hospitalized since last visit: No Patient Requires Transmission-Based Precautions: No Implantable device outside of the clinic excluding No Patient Has Alerts: No cellular tissue based products placed in the center since last visit: Has Dressing in Place as Prescribed: Yes Pain Present Now: Unable to Respond Electronic Signature(s) Signed: 04/22/2020 11:00:36 AM By: Karl Itoawkins, Destiny Entered By: Karl Itoawkins, Destiny on 04/17/2020 13:07:18 -------------------------------------------------------------------------------- Encounter Discharge Information Details Patient Name: Date of Service: Tracy Todd, Tracy Todd 04/17/2020 12:30 PM Medical Record Number: [id number] Patient Account Number: [id number] Date of Birth/Sex: Treating RN: [birth year] (54 y.o. Elizebeth KollerM) Lynch, Shatara Primary Care Damesha Lawler: Antony HasteBadger, Michael Other Clinician: Referring Concepcion Gillott: Treating Makail Watling/Extender: Murtis Sinkobson, Michael Badger, Michael Weeks in Treatment: 2 Encounter Discharge Information Items Post Procedure Vitals Discharge  Condition: Stable Temperature (F): 98.1 Ambulatory Status: Wheelchair Pulse (bpm): 87 Discharge Destination: Home Respiratory Rate (breaths/min): 18 Transportation: Private Auto Blood Pressure (mmHg): 94/61 Accompanied By: sister and cg Schedule Follow-up Appointment: Yes Clinical Summary of Care: Patient Declined Electronic Signature(s) Signed: 04/17/2020 6:01:16 PM By: Zandra AbtsLynch, Shatara RN, BSN Entered By: Zandra AbtsLynch, Shatara on 04/17/2020 16:33:36 -------------------------------------------------------------------------------- Lower Extremity Assessment Details Patient Name: Date of Service: Tracy Todd, Tracy Todd 04/17/2020 12:30 PM Medical Record Number: [id number] Patient Account Number: [id number] Date of Birth/Sex: Treating RN: [birth year] (54 y.o. Katherina RightM) Dwiggins, Shannon Primary Care Dontrail Blackwell: Antony HasteBadger, Michael Other Clinician: Referring Lakeysha Slutsky: Treating Jatavion Peaster/Extender: Murtis Sinkobson, Michael Badger, Michael Weeks in Treatment: 2 Electronic Signature(s) Signed: 04/17/2020 5:56:33 PM By: Cherylin Mylarwiggins, Shannon Entered By: Cherylin Mylarwiggins, Shannon on 04/17/2020 13:26:19 -------------------------------------------------------------------------------- Multi Wound Chart Details Patient Name: Date of Service: Tracy Todd, Tracy Todd 04/17/2020 12:30 PM Medical Record Number: [id number] Patient Account Number: [id number] Date of Birth/Sex: Treating RN: [birth year] (54 y.o. Katherina RightM) Dwiggins, Shannon Primary Care Shannette Tabares: Antony HasteBadger, Michael Other Clinician: Referring Daleen Steinhaus: Treating Roni Scow/Extender: Murtis Sinkobson, Michael Badger, Michael Weeks in Treatment: 2 Vital Signs Height(in): 60 Pulse(bpm): 87 Weight(lbs): 103 Blood Pressure(mmHg): 94/61 Body Mass Index(BMI): 20 Temperature(F): 98.1 Respiratory Rate(breaths/min): 18 Photos: [1:No Photos Left Trochanter] [2:No Photos Sacrum] [3:No Photos Right Elbow] Wound Location: [1:Gradually Appeared] [2:Gradually Appeared] [3:Shear/Friction] Wounding Event: [1:Pressure Ulcer]  [2:Pressure Ulcer] [3:Skin Tear] Primary Etiology: [1:12/21/2019] [2:12/21/2019] [3:04/17/2020] Date Acquired: [1:2] [2:2] [3:0] Weeks of Treatment: [1:Open] [2:Open] [3:Open] Wound Status: [1:3.5x2.5x0.8] [2:5x3.5x0.8] [3:3x2x0.1] Measurements L x W x D (cm) [1:6.872] [2:13.744] [3:4.712] A (cm) : rea [1:5.498] [2:10.996] [3:0.471] Volume (cm) : [1:51.40%] [2:2.80%] [3:N/A] % Reduction in A rea: [1:67.60%] [2:40.20%] [3:N/A] % Reduction in Volume: [1:12] [2:6] Starting Position 1 (o'clock): [1:12] [2:9] Ending Position 1 (o'clock): [1:2.5] [2:3.1] Maximum Distance 1 (cm): [1:Yes] [2:Yes] [3:N/A] Undermining: [1:Category/Stage IV] [2:Category/Stage IV] [3:N/A] Classification: [1:Medium] [2:Medium] [3:N/A] Exudate A mount: [1:Serosanguineous] [2:Serosanguineous] [3:N/A] Exudate Type: [1:red, brown] [2:red, brown] [3:N/A] Exudate Color: [1:Well defined, not attached] [2:Well defined, not attached] [3:N/A] Wound Margin: [1:Small (1-33%)] [2:Medium (34-66%)] [3:N/A] Granulation A mount: [1:Red,  Pink] [2:Pink] [3:N/A] Granulation Quality: [1:Large (67-100%)] [2:Medium (34-66%)] [3:N/A] Necrotic A mount: [1:Eschar, Adherent Slough] [2:Eschar, Adherent Slough] [3:N/A] Necrotic Tissue: [1:Fat Layer (Subcutaneous Tissue): Yes Fat Layer (Subcutaneous Tissue): Yes N/A] Exposed Structures: [1:Tendon: Yes Muscle: Yes Fascia: No Joint: No Bone: No Small (1-33%)] [2:Tendon: Yes Muscle: Yes Fascia: No Joint: No Bone: No None] [3:N/A] Epithelialization: Treatment Notes Electronic Signature(s) Signed: 04/17/2020 5:56:33 PM By: Cherylin Mylar Signed: 04/19/2020 7:39:18 AM By: Baltazar Najjar MD Entered By: Baltazar Najjar on 04/17/2020 14:05:28 -------------------------------------------------------------------------------- Multi-Disciplinary Care Plan Details Patient Name: Date of Service: EULALIO, REAMY 04/17/2020 12:30 PM Medical Record Number: [id number] Patient Account Number: [id number] Date of  Birth/Sex: Treating RN: 09-27-1965 (54 y.o. [name] Primary Care Saburo Luger: Antony Haste Other Clinician: Referring Nicco Reaume: Treating Elena Davia/Extender: Murtis Sink in Treatment: 2 Active Inactive Pain, Acute or Chronic Nursing Diagnoses: Pain, acute or chronic: actual or potential Goals: Patient/caregiver will verbalize adequate pain control between visits Date Initiated: 04/03/2020 Target Resolution Date: 05/08/2020 Goal Status: Active Interventions: Provide education on pain management Notes: Pressure Nursing Diagnoses: Knowledge deficit related to management of pressures ulcers Goals: Patient/caregiver will verbalize risk factors for pressure ulcer development Date Initiated: 04/03/2020 Target Resolution Date: 05/08/2020 Goal Status: Active Interventions: Provide education on pressure ulcers Notes: Electronic Signature(s) Signed: 04/17/2020 5:56:33 PM By: Cherylin Mylar Entered By: Cherylin Mylar on 04/17/2020 13:09:51 -------------------------------------------------------------------------------- Pain Assessment Details Patient Name: Date of Service: MARTI, ACEBO 04/17/2020 12:30 PM Medical Record Number: [id number] Patient Account Number: [id number] Date of Birth/Sex: Treating RN: 09-18-1965 (54 y.o. [name] Primary Care Lovie Agresta: Antony Haste Other Clinician: Referring Jyllian Haynie: Treating Emerald Shor/Extender: Murtis Sink in Treatment: 2 Active Problems Location of Pain Severity and Description of Pain Patient Has Paino Yes Site Locations Pain Management and Medication Current Pain Management: Notes he can't really express his pain level Electronic Signature(s) Signed: 04/17/2020 5:56:33 PM By: Cherylin Mylar Signed: 04/22/2020 11:00:36 AM By: Karl Ito Entered By: Karl Ito on 04/17/2020  13:08:22 -------------------------------------------------------------------------------- Patient/Caregiver Education Details Patient Name: Date of Service: CARYL, MANAS 8/27/2021andnbsp12:30 PM Medical Record Number: [id number] Patient Account Number: [id number] Date of Birth/Gender: Treating RN: February 20, 1966 (54 y.o. [name] Primary Care Physician: Antony Haste Other Clinician: Referring Physician: Treating Physician/Extender: Murtis Sink in Treatment: 2 Education Assessment Education Provided To: Caregiver Education Topics Provided Pain: Handouts: A Guide to Pain Control Methods: Explain/Verbal Responses: State content correctly Pressure: Handouts: Pressure Ulcers: Care and Offloading Methods: Explain/Verbal Responses: State content correctly Electronic Signature(s) Signed: 04/17/2020 5:56:33 PM By: Cherylin Mylar Entered By: Cherylin Mylar on 04/17/2020 13:10:21 -------------------------------------------------------------------------------- Wound Assessment Details Patient Name: Date of Service: LINVILLE, [place] 04/17/2020 12:30 PM Medical Record Number: [id number] Patient Account Number: [id number] Date of Birth/Sex: Treating RN: October 02, 1965 (54 y.o. [name] Primary Care Ivon Roedel: Antony Haste Other Clinician: Referring Lekisha Mcghee: Treating Richell Corker/Extender: Murtis Sink in Treatment: 2 Wound Status Wound Number: 1 Primary Etiology: Pressure Ulcer Wound Location: Left Trochanter Wound Status: Open Wounding Event: Gradually Appeared Date Acquired: 12/21/2019 Weeks Of Treatment: 2 Clustered Wound: No Photos Photo Uploaded By: Benjaman Kindler on 04/20/2020 11:17:44 Wound Measurements Length: (cm) 3.5 Width: (cm) 2.5 Depth: (cm) 0.8 Area: (cm) 6.872 Volume: (cm) 5.498 % Reduction in Area: 51.4% % Reduction in Volume: 67.6% Epithelialization: Small (1-33%) Tunneling:  No Undermining: Yes Starting Position (o'clock): 12 Ending Position (o'clock): 12 Maximum Distance: (cm) 2.5 Wound Description Classification: Category/Stage IV Wound Margin: Well defined, not attached Exudate Amount: Medium Exudate Type: Serosanguineous Exudate  Color: red, brown Foul Odor After Cleansing: No Slough/Fibrino Yes Wound Bed Granulation Amount: Small (1-33%) Exposed Structure Granulation Quality: Red, Pink Fascia Exposed: No Necrotic Amount: Large (67-100%) Fat Layer (Subcutaneous Tissue) Exposed: Yes Necrotic Quality: Eschar, Adherent Slough Tendon Exposed: Yes Muscle Exposed: Yes Necrosis of Muscle: No Joint Exposed: No Bone Exposed: No Treatment Notes Wound #1 (Left Trochanter) 1. Cleanse With Wound Cleanser 2. Periwound Care Skin Prep 3. Primary Dressing Applied Santyl Other primary dressing (specifiy in notes) 4. Secondary Dressing Foam Border Dressing Notes saline moistened gauze over Santyl Electronic Signature(s) Signed: 04/17/2020 5:56:33 PM By: Cherylin Mylar Entered By: Cherylin Mylar on 04/17/2020 13:27:32 -------------------------------------------------------------------------------- Wound Assessment Details Patient Name: Date of Service: EZEKIEL, MENZER 04/17/2020 12:30 PM Medical Record Number: [id number] Patient Account Number: [id number] Date of Birth/Sex: Treating RN: 10-Jun-1966 (54 y.o. [name] Primary Care Letisia Schwalb: Antony Haste Other Clinician: Referring Vanice Rappa: Treating Kelty Szafran/Extender: Murtis Sink in Treatment: 2 Wound Status Wound Number: 2 Primary Etiology: Pressure Ulcer Wound Location: Sacrum Wound Status: Open Wounding Event: Gradually Appeared Date Acquired: 12/21/2019 Weeks Of Treatment: 2 Clustered Wound: No Photos Photo Uploaded By: Benjaman Kindler on 04/20/2020 11:17:57 Wound Measurements Length: (cm) 5 Width: (cm) 3.5 Depth: (cm) 0.8 Area: (cm)  13.744 Volume: (cm) 10.996 % Reduction in Area: 2.8% % Reduction in Volume: 40.2% Epithelialization: None Tunneling: No Undermining: Yes Starting Position (o'clock): 6 Ending Position (o'clock): 9 Maximum Distance: (cm) 3.1 Wound Description Classification: Category/Stage IV Wound Margin: Well defined, not attached Exudate Amount: Medium Exudate Type: Serosanguineous Exudate Color: red, brown Foul Odor After Cleansing: No Slough/Fibrino Yes Wound Bed Granulation Amount: Medium (34-66%) Exposed Structure Granulation Quality: Pink Fascia Exposed: No Necrotic Amount: Medium (34-66%) Fat Layer (Subcutaneous Tissue) Exposed: Yes Necrotic Quality: Eschar, Adherent Slough Tendon Exposed: Yes Muscle Exposed: Yes Necrosis of Muscle: No Joint Exposed: No Bone Exposed: No Treatment Notes Wound #2 (Sacrum) 1. Cleanse With Wound Cleanser 2. Periwound Care Skin Prep 3. Primary Dressing Applied Santyl Other primary dressing (specifiy in notes) 4. Secondary Dressing Foam Border Dressing Notes saline moistened gauze over Santyl Electronic Signature(s) Signed: 04/17/2020 5:56:33 PM By: Cherylin Mylar Entered By: Cherylin Mylar on 04/17/2020 13:28:43 -------------------------------------------------------------------------------- Wound Assessment Details Patient Name: Date of Service: RISHAB, STOUDT 04/17/2020 12:30 PM Medical Record Number: [id number] Patient Account Number: [id number] Date of Birth/Sex: Treating RN: [birth year] (54 y.o. [name] Primary Care Tava Peery: Antony Haste Other Clinician: Referring Olaf Mesa: Treating Sutton Plake/Extender: Murtis Sink in Treatment: 2 Wound Status Wound Number: 3 Primary Etiology: Skin Tear Wound Location: Right Elbow Wound Status: Open Wounding Event: Shear/Friction Date Acquired: 04/17/2020 Weeks Of Treatment: 0 Clustered Wound: No Photos Photo Uploaded By: Benjaman Kindler on  04/20/2020 11:18:12 Wound Measurements Length: (cm) Width: (cm) Depth: (cm) Area: (cm) Volume: (cm) 3 % Reduction in Area: 2 % Reduction in Volume: 0.1 4.712 0.471 Treatment Notes Wound #3 (Right Elbow) 1. Cleanse With Wound Cleanser 3. Primary Dressing Applied Xeroform Gauze 4. Secondary Dressing Foam Border Dressing Electronic Signature(s) Signed: 04/17/2020 5:56:33 PM By: Cherylin Mylar Signed: 04/22/2020 11:00:36 AM By: Karl Ito Entered By: Karl Ito on 04/17/2020 13:20:53 -------------------------------------------------------------------------------- Vitals Details Patient Name: Date of Service: JAMEY, HARMAN 04/17/2020 12:30 PM Medical Record Number: [id number] Patient Account Number: [id number] Date of Birth/Sex: Treating RN: 05/15/1966 (54 y.o. [name] Primary Care Breeanne Oblinger: Antony Haste Other Clinician: Referring Queena Monrreal: Treating Viriginia Amendola/Extender: Murtis Sink in Treatment: 2 Vital Signs Time Taken: 13:07 Temperature (F): 98.1 Height (in): 60 Pulse (bpm):  87 Weight (lbs): 103 Respiratory Rate (breaths/min): 18 Body Mass Index (BMI): 20.1 Blood Pressure (mmHg): 94/61 Reference Range: 80 - 120 mg / dl Electronic Signature(s) Signed: 04/22/2020 11:00:36 AM By: Karl Ito Entered By: Karl Ito on 04/17/2020 13:07:35

## 2020-05-01 ENCOUNTER — Other Ambulatory Visit: Payer: Self-pay | Admitting: Family

## 2020-05-01 ENCOUNTER — Ambulatory Visit (HOSPITAL_COMMUNITY)
Admission: RE | Admit: 2020-05-01 | Discharge: 2020-05-01 | Disposition: A | Discharge status: Home / Self Care | Payer: Medicare Other | Source: Ambulatory Visit | Attending: Internal Medicine | Admitting: Internal Medicine

## 2020-05-01 ENCOUNTER — Other Ambulatory Visit (HOSPITAL_COMMUNITY)
Admission: RE | Admit: 2020-05-01 | Discharge: 2020-05-01 | Disposition: A | Discharge status: Home / Self Care | Payer: Medicare Other | Attending: Internal Medicine | Admitting: Internal Medicine

## 2020-05-01 ENCOUNTER — Other Ambulatory Visit (HOSPITAL_COMMUNITY): Payer: Self-pay | Admitting: Internal Medicine

## 2020-05-01 ENCOUNTER — Encounter (HOSPITAL_BASED_OUTPATIENT_CLINIC_OR_DEPARTMENT_OTHER): Payer: Medicare Other | Attending: Internal Medicine | Admitting: Internal Medicine

## 2020-05-01 ENCOUNTER — Other Ambulatory Visit: Payer: Self-pay

## 2020-05-01 DIAGNOSIS — M86152 Other acute osteomyelitis, left femur: Secondary | ICD-10-CM | POA: Insufficient documentation

## 2020-05-01 DIAGNOSIS — Z79899 Other long term (current) drug therapy: Secondary | ICD-10-CM | POA: Diagnosis not present

## 2020-05-01 DIAGNOSIS — L03116 Cellulitis of left lower limb: Secondary | ICD-10-CM | POA: Diagnosis not present

## 2020-05-01 DIAGNOSIS — L89224 Pressure ulcer of left hip, stage 4: Secondary | ICD-10-CM | POA: Insufficient documentation

## 2020-05-01 DIAGNOSIS — L89154 Pressure ulcer of sacral region, stage 4: Secondary | ICD-10-CM

## 2020-05-01 DIAGNOSIS — Z880 Allergy status to penicillin: Secondary | ICD-10-CM | POA: Diagnosis not present

## 2020-05-01 DIAGNOSIS — Q909 Down syndrome, unspecified: Secondary | ICD-10-CM | POA: Diagnosis present

## 2020-05-01 DIAGNOSIS — Z7902 Long term (current) use of antithrombotics/antiplatelets: Secondary | ICD-10-CM | POA: Insufficient documentation

## 2020-05-01 DIAGNOSIS — L89012 Pressure ulcer of right elbow, stage 2: Secondary | ICD-10-CM | POA: Insufficient documentation

## 2020-05-01 DIAGNOSIS — E781 Pure hyperglyceridemia: Secondary | ICD-10-CM

## 2020-05-04 ENCOUNTER — Other Ambulatory Visit: Payer: Self-pay | Admitting: Family

## 2020-05-04 LAB — AEROBIC CULTURE W GRAM STAIN (SUPERFICIAL SPECIMEN)

## 2020-05-04 NOTE — Progress Notes (Signed)
Tracy Todd, Tracy Todd ([id number]) Visit Report for 05/01/2020 Arrival Information Details Patient Name: Date of Service: Tracy Todd, Tracy Todd 05/01/2020 12:30 PM Medical Record Number: [id number] Patient Account Number: [id number] Date of Birth/Sex: Treating RN: 1966/04/15 (54 y.o. Judie Petit) Yevonne Pax Primary Care Velna Hedgecock: Antony Haste Other Clinician: Referring Kunta Hilleary: Treating Hadlyn Amero/Extender: Murtis Sink in Treatment: 4 Visit Information History Since Last Visit All ordered tests and consults were completed: No Patient Arrived: Wheel Chair Added or deleted any medications: No Arrival Time: 13:07 Any new allergies or adverse reactions: No Accompanied By: caregiver Had a fall or experienced change in No Transfer Assistance: None activities of daily living that may affect Patient Identification Verified: Yes risk of falls: Secondary Verification Process Completed: Yes Signs or symptoms of abuse/neglect since last visito No Patient Requires Transmission-Based Precautions: No Hospitalized since last visit: No Patient Has Alerts: No Implantable device outside of the clinic excluding No cellular tissue based products placed in the center since last visit: Has Dressing in Place as Prescribed: Yes Pain Present Now: Unable to Respond Electronic Signature(s) Signed: 05/01/2020 4:58:04 PM By: Yevonne Pax RN Entered By: Yevonne Pax on 05/01/2020 13:08:21 -------------------------------------------------------------------------------- Encounter Discharge Information Details Patient Name: Date of Service: Tracy Todd, Tracy Todd 05/01/2020 12:30 PM Medical Record Number: [id number] Patient Account Number: [id number] Date of Birth/Sex: Treating RN: 20-Mar-1966 (54 y.o. Tammy Sours Primary Care Brinda Focht: Antony Haste Other Clinician: Referring Madai Nuccio: Treating Xavian Hardcastle/Extender: Murtis Sink in Treatment: 4 Encounter Discharge Information Items Post  Procedure Vitals Discharge Condition: Stable Temperature (F): 98 Ambulatory Status: Wheelchair Pulse (bpm): 88 Discharge Destination: Home Respiratory Rate (breaths/min): 20 Transportation: Private Auto Blood Pressure (mmHg): 100/68 Accompanied By: caregiver Schedule Follow-up Appointment: Yes Clinical Summary of Care: Electronic Signature(s) Signed: 05/01/2020 4:55:25 PM By: Shawn Stall Entered By: Shawn Stall on 05/01/2020 14:49:46 -------------------------------------------------------------------------------- Multi Wound Chart Details Patient Name: Date of Service: Tracy Todd, Tracy Todd 05/01/2020 12:30 PM Medical Record Number: [id number] Patient Account Number: [id number] Date of Birth/Sex: Treating RN: 02-11-1966 (54 y.o. [name] Primary Care Myrtle Barnhard: Antony Haste Other Clinician: Referring Arval Brandstetter: Treating Kadarrius Yanke/Extender: Murtis Sink in Treatment: 4 Vital Signs Height(in): 60 Pulse(bpm): 88 Weight(lbs): 103 Blood Pressure(mmHg): 100/68 Body Mass Index(BMI): 20 Temperature(F): 98 Respiratory Rate(breaths/min): 20 Photos: [1:No Photos Left Trochanter] [2:No Photos Sacrum] [3:No Photos Right Elbow] Wound Location: [1:Gradually Appeared] [2:Gradually Appeared] [3:Shear/Friction] Wounding Event: [1:Pressure Ulcer] [2:Pressure Ulcer] [3:Skin T ear] Primary Etiology: [1:12/21/2019] [2:12/21/2019] [3:04/17/2020] Date Acquired: [1:4] [2:4] [3:2] Weeks of Treatment: [1:Open] [2:Open] [3:Open] Wound Status: [1:3x3x3.5] [2:6.5x2.5x1.2] [3:0.4x0.2x0.1] Measurements L x W x D (cm) [1:7.069] [2:12.763] [3:0.063] A (cm) : rea [1:24.74] [2:15.315] [3:0.006] Volume (cm) : [1:50.00%] [2:9.70%] [3:98.70%] % Reduction in A rea: [1:-45.80%] [2:16.70%] [3:98.70%] % Reduction in Volume: [1:7] [2:7] [3:7] Starting Position 1 (o'clock): [1:1] [2:1] [3:1] Ending Position 1 (o'clock): [1:8.5] [2:3] [3:3.5] Maximum Distance 1 (cm): [1:Yes] [2:Yes]  [3:Yes] Undermining: [1:Category/Stage IV] [2:Category/Stage IV] [3:Full Thickness Without Exposed] Classification: [1:Medium] [2:Medium] [3:Support Structures Medium] Exudate A mount: [1:Serosanguineous] [2:Serosanguineous] [3:Serosanguineous] Exudate Type: [1:red, brown] [2:red, brown] [3:red, brown] Exudate Color: [1:Well defined, not attached] [2:Well defined, not attached] [3:N/A] Wound Margin: [1:Small (1-33%)] [2:Medium (34-66%)] [3:Large (67-100%)] Granulation A mount: [1:Red, Pink] [2:Pink] [3:Red] Granulation Quality: [1:Large (67-100%)] [2:Medium (34-66%)] [3:None Present (0%)] Necrotic A mount: [1:Eschar, Adherent Slough] [2:Eschar, Adherent Slough] [3:N/A] Necrotic Tissue: [1:Fat Layer (Subcutaneous Tissue): Yes Fat Layer (Subcutaneous Tissue): Yes Fat Layer (Subcutaneous Tissue): Yes] Exposed Structures: [1:Tendon: Yes Muscle: Yes Fascia: No Joint: No Bone: No Small (1-33%)] [2:Tendon: Yes  Muscle: Yes Fascia: No Joint: No Bone: No None] [3:Fascia: No Tendon: No Muscle: No Joint: No Bone: No None] Epithelialization: [1:Debridement - Excisional] [2:N/A] [3:N/A] Debridement: Pre-procedure Verification/Time Out 13:20 [2:N/A] [3:N/A] Taken: [1:Other] [2:N/A] [3:N/A] Pain Control: [1:Subcutaneous] [2:N/A] [3:N/A] Tissue Debrided: [1:Skin/Subcutaneous Tissue] [2:N/A] [3:N/A] Level: [1:4] [2:N/A] [3:N/A] Debridement A (sq cm): [1:rea Forceps, Scissors] [2:N/A] [3:N/A] Instrument: [1:Swab] [2:N/A] [3:N/A] Specimen: [1:1] [2:N/A] [3:N/A] Number of Specimens Taken: [1:Minimum] [2:N/A] [3:N/A] Bleeding: [1:Pressure] [2:N/A] [3:N/A] Hemostasis A chieved: [1:2] [2:N/A] [3:N/A] Procedural Pain: [1:0] [2:N/A] [3:N/A] Post Procedural Pain: [1:Procedure was tolerated well] [2:N/A] [3:N/A] Debridement Treatment Response: [1:3x3x3.5] [2:N/A] [3:N/A] Post Debridement Measurements L x W x D (cm) [1:24.74] [2:N/A] [3:N/A] Post Debridement Volume: (cm) [1:Category/Stage IV] [2:N/A]  [3:N/A] Post Debridement Stage: [1:Debridement] [2:N/A] [3:N/A] Treatment Notes Electronic Signature(s) Signed: 05/01/2020 4:40:23 PM By: Cherylin Mylar Signed: 05/04/2020 7:26:40 PM By: Baltazar Najjar MD Entered By: Baltazar Najjar on 05/01/2020 13:57:14 -------------------------------------------------------------------------------- Multi-Disciplinary Care Plan Details Patient Name: Date of Service: Tracy Todd, Tracy Todd 05/01/2020 12:30 PM Medical Record Number: [id number] Patient Account Number: [id number] Date of Birth/Sex: Treating RN: [birth year] (54 y.o. [name] Primary Care Claire Bridge: Antony Haste Other Clinician: Referring Tanyia Grabbe: Treating Salote Weidmann/Extender: Murtis Sink in Treatment: 4 Active Inactive Pain, Acute or Chronic Nursing Diagnoses: Pain, acute or chronic: actual or potential Goals: Patient/caregiver will verbalize adequate pain control between visits Date Initiated: 04/03/2020 Target Resolution Date: 05/08/2020 Goal Status: Active Interventions: Provide education on pain management Notes: Pressure Nursing Diagnoses: Knowledge deficit related to management of pressures ulcers Goals: Patient/caregiver will verbalize risk factors for pressure ulcer development Date Initiated: 04/03/2020 Target Resolution Date: 05/08/2020 Goal Status: Active Interventions: Provide education on pressure ulcers Notes: Electronic Signature(s) Signed: 05/01/2020 4:40:23 PM By: Cherylin Mylar Entered By: Cherylin Mylar on 05/01/2020 13:03:54 -------------------------------------------------------------------------------- Pain Assessment Details Patient Name: Date of Service: Tracy Todd, Tracy Todd 05/01/2020 12:30 PM Medical Record Number: [id number] Patient Account Number: [id number] Date of Birth/Sex: Treating RN: 01/22/1966 (54 y.o. Melonie [place] Primary Care Tacy Chavis: Antony Haste Other Clinician: Referring Ceejay Kegley: Treating  Jarrod Bodkins/Extender: Murtis Sink in Treatment: 4 Active Problems Location of Pain Severity and Description of Pain Patient Has Paino No Site Locations Pain Management and Medication Current Pain Management: Electronic Signature(s) Signed: 05/01/2020 4:58:04 PM By: Yevonne Pax RN Entered By: Yevonne Pax on 05/01/2020 13:08:56 -------------------------------------------------------------------------------- Patient/Caregiver Education Details Patient Name: Date of Service: Tracy Todd 9/10/2021andnbsp12:30 PM Medical Record Number: [id number] Patient Account Number: [id number] Date of Birth/Gender: Treating RN: [birth year] (54 y.o. [name] Primary Care Physician: Antony Haste Other Clinician: Referring Physician: Treating Physician/Extender: Murtis Sink in Treatment: 4 Education Assessment Education Provided To: Caregiver Education Topics Provided Pain: Handouts: A Guide to Pain Control Methods: Explain/Verbal Responses: State content correctly Pressure: Handouts: Pressure Ulcers: Care and Offloading Methods: Explain/Verbal Responses: State content correctly Electronic Signature(s) Signed: 05/01/2020 4:40:23 PM By: Cherylin Mylar Entered By: Cherylin Mylar on 05/01/2020 13:04:13 -------------------------------------------------------------------------------- Wound Assessment Details Patient Name: Date of Service: Tracy Todd, Tracy Todd 05/01/2020 12:30 PM Medical Record Number: [id number] Patient Account Number: [id number] Date of Birth/Sex: Treating RN: 1966-07-08 (54 y.o. Melonie [place] Primary Care Adriell Polansky: Antony Haste Other Clinician: Referring Breezy Hertenstein: Treating Adriene Knipfer/Extender: Murtis Sink in Treatment: 4 Wound Status Wound Number: 1 Primary Etiology: Pressure Ulcer Wound Location: Left Trochanter Wound Status: Open Wounding Event: Gradually Appeared Date  Acquired: 12/21/2019 Weeks Of Treatment: 4 Clustered Wound: No Wound Measurements Length: (cm) 3 Width: (cm) 3 Depth: (cm) 3.5 Area: (cm) 7.069 Volume: (  cm) 24.74 % Reduction in Area: 50% % Reduction in Volume: -45.8% Epithelialization: Small (1-33%) Tunneling: No Undermining: Yes Starting Position (o'clock): 7 Ending Position (o'clock): 1 Maximum Distance: (cm) 8.5 Wound Description Classification: Category/Stage IV Wound Margin: Well defined, not attached Exudate Amount: Medium Exudate Type: Serosanguineous Exudate Color: red, brown Foul Odor After Cleansing: No Slough/Fibrino Yes Wound Bed Granulation Amount: Small (1-33%) Exposed Structure Granulation Quality: Red, Pink Fascia Exposed: No Necrotic Amount: Large (67-100%) Fat Layer (Subcutaneous Tissue) Exposed: Yes Necrotic Quality: Eschar, Adherent Slough Tendon Exposed: Yes Muscle Exposed: Yes Necrosis of Muscle: No Joint Exposed: No Bone Exposed: No Treatment Notes Wound #1 (Left Trochanter) 1. Cleanse With Wound Cleanser 2. Periwound Care Skin Prep 3. Primary Dressing Applied Calcium Alginate Ag 4. Secondary Dressing Dry Gauze 5. Secured With [employer]) Signed: 05/01/2020 4:58:04 PM By: Yevonne Pax RN Entered By: Yevonne Pax on 05/01/2020 13:20:12 -------------------------------------------------------------------------------- Wound Assessment Details Patient Name: Date of Service: Tracy Todd, Tracy Todd 05/01/2020 12:30 PM Medical Record Number: [id number] Patient Account Number: [id number] Date of Birth/Sex: Treating RN: 07/17/1966 (54 y.o. Judie Petit) Yevonne Pax Primary Care Nylan Nevel: Antony Haste Other Clinician: Referring Florestine Carmical: Treating Nicle Connole/Extender: Murtis Sink in Treatment: 4 Wound Status Wound Number: 2 Primary Etiology: Pressure Ulcer Wound Location: Sacrum Wound Status: Open Wounding Event: Gradually Appeared Date Acquired:  12/21/2019 Weeks Of Treatment: 4 Clustered Wound: No Wound Measurements Length: (cm) 6.5 Width: (cm) 2.5 Depth: (cm) 1.2 Area: (cm) 12.763 Volume: (cm) 15.315 % Reduction in Area: 9.7% % Reduction in Volume: 16.7% Epithelialization: None Tunneling: No Undermining: Yes Starting Position (o'clock): 7 Ending Position (o'clock): 1 Maximum Distance: (cm) 3 Wound Description Classification: Category/Stage IV Wound Margin: Well defined, not attached Exudate Amount: Medium Exudate Type: Serosanguineous Exudate Color: red, brown Foul Odor After Cleansing: No Slough/Fibrino Yes Wound Bed Granulation Amount: Medium (34-66%) Exposed Structure Granulation Quality: Pink Fascia Exposed: No Necrotic Amount: Medium (34-66%) Fat Layer (Subcutaneous Tissue) Exposed: Yes Necrotic Quality: Eschar, Adherent Slough Tendon Exposed: Yes Muscle Exposed: Yes Necrosis of Muscle: No Joint Exposed: No Bone Exposed: No Treatment Notes Wound #2 (Sacrum) 1. Cleanse With Wound Cleanser 2. Periwound Care Skin Prep 3. Primary Dressing Applied Calcium Alginate Ag 4. Secondary Dressing Dry Gauze 5. Secured With [employer]) Signed: 05/01/2020 4:58:04 PM By: Yevonne Pax RN Entered By: Yevonne Pax on 05/01/2020 13:11:20 -------------------------------------------------------------------------------- Wound Assessment Details Patient Name: Date of Service: Tracy Todd, Tracy Todd 05/01/2020 12:30 PM Medical Record Number: [id number] Patient Account Number: [id number] Date of Birth/Sex: Treating RN: April 10, 1966 (54 y.o. Judie Petit) Yevonne Pax Primary Care Cabrina Shiroma: Antony Haste Other Clinician: Referring Bram Hottel: Treating Rafan Sanders/Extender: Murtis Sink in Treatment: 4 Wound Status Wound Number: 3 Primary Etiology: Skin Tear Wound Location: Right Elbow Wound Status: Open Wounding Event: Shear/Friction Date Acquired: 04/17/2020 Weeks Of Treatment:  2 Clustered Wound: No Wound Measurements Length: (cm) 0.4 Width: (cm) 0.2 Depth: (cm) 0.1 Area: (cm) 0.063 Volume: (cm) 0.006 % Reduction in Area: 98.7% % Reduction in Volume: 98.7% Epithelialization: None Tunneling: No Undermining: Yes Starting Position (o'clock): 7 Ending Position (o'clock): 1 Maximum Distance: (cm) 3.5 Wound Description Classification: Full Thickness Without Exposed Support Structures Exudate Amount: Medium Exudate Type: Serosanguineous Exudate Color: red, brown Foul Odor After Cleansing: No Slough/Fibrino No Wound Bed Granulation Amount: Large (67-100%) Exposed Structure Granulation Quality: Red Fascia Exposed: No Necrotic Amount: None Present (0%) Fat Layer (Subcutaneous Tissue) Exposed: Yes Tendon Exposed: No Muscle Exposed: No Joint Exposed: No Bone Exposed: No Treatment Notes Wound #3 (Right Elbow)  1. Cleanse With Wound Cleanser 3. Primary Dressing Applied Xeroform Gauze 4. Secondary Dressing Dry Gauze Other secondary dressing (specify in notes) Notes netting. Electronic Signature(s) Signed: 05/01/2020 4:58:04 PM By: Yevonne PaxEpps, Carrie RN Entered By: Yevonne PaxEpps, Carrie on 05/01/2020 13:12:26 -------------------------------------------------------------------------------- Vitals Details Patient Name: Date of Service: Tracy Todd, Tracy Todd 05/01/2020 12:30 PM Medical Record Number: [id number] Patient Account Number: [id number] Date of Birth/Sex: Treating RN: [birth year] (54 y.o. Judie PetitM) Yevonne PaxEpps, Carrie Primary Care Malonie Tatum: Antony HasteBadger, Michael Other Clinician: Referring Emika Tiano: Treating Elecia Serafin/Extender: Murtis Sinkobson, Michael Badger, Michael Weeks in Treatment: 4 Vital Signs Time Taken: 13:08 Temperature (F): 98 Height (in): 60 Pulse (bpm): 88 Weight (lbs): 103 Respiratory Rate (breaths/min): 20 Body Mass Index (BMI): 20.1 Blood Pressure (mmHg): 100/68 Reference Range: 80 - 120 mg / dl Electronic Signature(s) Signed: 05/01/2020 4:58:04 PM By: Yevonne PaxEpps, Carrie  RN Entered By: Yevonne PaxEpps, Carrie on 05/01/2020 13:08:49

## 2020-05-04 NOTE — Telephone Encounter (Signed)
Refill request received for Vitamin B-12 1000 mcg take 1/2 tablet daily, but medication list states one tablet daily. Please clarify. Medication pended and sent to [facility] for clarification and approval.

## 2020-05-04 NOTE — Progress Notes (Signed)
Tracy Todd, LEFFLER ([id number]) Visit Report for 05/01/2020 Debridement Details Patient Name: Date of Service: TORON, BOWRING 05/01/2020 12:30 PM Medical Record Number: [id number] Patient Account Number: [id number] Date of Birth/Sex: Treating RN: 1966-05-03 (54 y.o. Marvis Repress Primary Care Provider: Anastasia Pall Other Clinician: Referring Provider: Treating Provider/Extender: Wende Mott in Treatment: 4 Debridement Performed for Assessment: Wound #1 Left Trochanter Performed By: Physician Ricard Dillon., MD Debridement Type: Debridement Level of Consciousness (Pre-procedure): Awake and Alert Pre-procedure Verification/Time Out Yes - 13:20 Taken: Start Time: 13:20 Pain Control: Other : benzocaine, 20% T Area Debrided (L x W): otal 2 (cm) x 2 (cm) = 4 (cm) Tissue and other material debrided: Viable, Non-Viable, Subcutaneous Level: Skin/Subcutaneous Tissue Debridement Description: Excisional Instrument: Forceps, Scissors Specimen: Swab, Number of Specimens T aken: 1 Bleeding: Minimum Hemostasis Achieved: Pressure End Time: 13:21 Procedural Pain: 2 Post Procedural Pain: 0 Response to Treatment: Procedure was tolerated well Level of Consciousness (Post- Awake and Alert procedure): Post Debridement Measurements of Total Wound Length: (cm) 3 Stage: Category/Stage IV Width: (cm) 3 Depth: (cm) 3.5 Volume: (cm) 24.74 Character of Wound/Ulcer Post Debridement: Requires Further Debridement Post Procedure Diagnosis Same as Pre-procedure Electronic Signature(s) Signed: 05/01/2020 4:40:23 PM By: Kela Millin Signed: 05/04/2020 7:26:40 PM By: Linton Ham MD Entered By: Linton Ham on 05/01/2020 13:57:23 -------------------------------------------------------------------------------- HPI Details Patient Name: Date of Service: Tracy Todd 05/01/2020 12:30 PM Medical Record Number: [id number] Patient Account Number: [id number] Date of  Birth/Sex: Treating RN: 1966/08/03 (54 y.o. Marvis Repress Primary Care Provider: Anastasia Pall Other Clinician: Referring Provider: Treating Provider/Extender: Wende Mott in Treatment: 4 History of Present Illness HPI Description: 54 year old Down syndrome patient who unfortunately developed 2 deep tissue pressure wounds while recuperating at skilled nursing facility in May, patient was admitted to [facility] health for syncopal event in April and after hospitalization was discharged to skilled nursing facility, his work-up included Doppler studies that showed right internal carotid artery stenosis for which she was placed on Plavix. After being at the skilled nursing facility patient developed 2 wounds and was admitted in June for an 8-day hospital stay for wound care. 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 He had a pelvic CT that showed ulceration extending along the right buttock into the medial margin of the gluteus maximus muscle without signs of osteomyelitis. Patient had wound care with bedside debridement and discharge to different facility at discharge. He is currently at a group home where he is getting home health to assist with wound care especially with central and daily dressing changes. And referred to the wound clinic for further care. Patient is nonverbal, and cannot follow instructions, sister and caregiver are present at bedside. According to them the wounds look "better" than before considering that he had black eschar covering the wounds and had some debridement performed that remove this layer. Patient's currently on iron supplements, Plavix, nutritional supplements, lipid-lowering agent. 04/10/2020; this is a patient with 2 stage IV wounds in the lower sacrum and the left greater trochanter. He required debridement last week. They have been using Santyl but  apparently they ran out of this and Medicaid would not cover a represcription. The patient has Down syndrome. In talking with his sister he is no longer verbal and nonambulatory this is a big deterioration over the last year. She states that she thinks this was a result of the first shot of  pneumonia during a vaccine last January although I think he was having some problem before that. His caregiver is also here. Apparently eats and drinks well they crush his medications. They have an eggcrate pressure relief surface 8/27; left greater trochanter and lower sacrum both stage IV pressure ulcers in this very frail man with advanced Down syndrome likely underlying Alzheimer's disease. He apparently is eating and drinking well per his caregiver. They have some form of pressure relief bed. I put him on antibiotics last week because of erythema around the greater trochanter but I did not culture. He has a new superficial area over his left elbow 9/10; this is a patient who is very disabled. Presumably Down syndrome. It does not sound talking to his caregiver that he was ever that functional but he is currently better chair bound flexion contractures minimally verbal. 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 According to his caregiver she eats and drinks well. We have been using silver alginate to the wounds. I gave him a course of Levaquin 3 weeks ago empirically without cultures. His wound especially over the greater trochanter is worse today Electronic Signature(s) Signed: 05/04/2020 7:26:40 PM By: Linton Ham MD Entered By: Linton Ham on 05/01/2020 13:58:32 -------------------------------------------------------------------------------- Physical Exam Details Patient Name: Date of Service: Tracy Todd 05/01/2020 12:30 PM Medical Record Number: [id number] Patient Account Number: [id number] Date of Birth/Sex: Treating RN: 08-Jan-1966 (54 y.o. Marvis Repress Primary Care Provider: Anastasia Pall Other Clinician: Referring Provider: Treating Provider/Extender: Wende Mott in Treatment: 4 Constitutional Sitting or standing Blood Pressure is within target range for patient.. Pulse regular and within target range for patient.Marland Kitchen Respirations regular, non-labored and within target range.. Temperature is normal and within the target range for the patient.Marland Kitchen Appears in no distress. Respiratory work of breathing is normal. Gastrointestinal (GI) Abdomen is soft and non-distended without masses or tenderness.. Genitourinary (GU) Bladder is not distended. Psychiatric The patient is minimally verbal occasional one-word answers.. Notes Wound exam The area over the left greater trochanter is deteriorated since I last saw this. Necrotic debris with odor and serosanguineous drainage. I have cultured this removed some necrotic subcutaneous tissue. There is exposed tendon but no exposed bone. Significant undermining The sacral wound is not as bad although there is tender erythema around this as well Electronic Signature(s) Signed: 05/04/2020 7:26:40 PM By: Linton Ham MD Entered By: Linton Ham on 05/01/2020 13:59:52 -------------------------------------------------------------------------------- Physician Orders Details Patient Name: Date of Service: JONTEZ, REDFIELD 05/01/2020 12:30 PM Medical Record Number: [id number] Patient Account Number: [id number] Date of Birth/Sex: Treating RN: [birth year] (54 y.o. Marvis Repress Primary Care Provider: Anastasia Pall Other Clinician: Referring Provider: Treating Provider/Extender: Wende Mott in Treatment: 4 Verbal / Phone Orders: No Diagnosis Coding ICD-10 Coding Code Description Q90.9 Down syndrome, unspecified L89.224 Pressure ulcer of left hip, stage 4 L89.154 Pressure ulcer of sacral region, stage 4 L03.116 Cellulitis of  left lower limb L89.012 Pressure ulcer of right elbow, stage 2 Follow-up Appointments ppointment in 2 weeks. - Staff transfers Return A Other: - take antibiotics as prescribed Dressing Change Frequency Wound #1 Left Trochanter Change dressing every day. Wound #2 Sacrum Change dressing every day. Wound #3 Right Elbow Change Dressing every other day. Skin Barriers/Peri-Wound Care Wound #1 Left Trochanter Skin Prep Wound #2 Sacrum Skin Prep Wound Cleansing Wound #1 Left Trochanter Clean wound with Wound Cleanser Wound #2 Sacrum Clean wound with Wound Cleanser Wound #3 Right  Elbow Clean wound with Wound Cleanser Primary Wound Dressing Wound #1 Left Trochanter Calcium Alginate with Silver Wound #2 Sacrum Calcium Alginate with Silver Wound #3 Right Elbow Xeroform Secondary Dressing Wound #1 Left Trochanter Foam Border - or ABD pad and tape Wound #2 Sacrum Foam Border - or ABD pad and tape Wound #3 Right Elbow Foam Border Off-Loading Turn and reposition every 2 hours [place] skilled nursing for wound care. - [facility] naerobe culture (MICRO) - Left trochanter wound - (ICD10 L89.224 - Pressure ulcer of left hip, Bacteria identified in Unspecified specimen by A stage 4) LOINC Code: 431-5 Convenience Name: Anerobic culture C reactive protein [Mass/volume] in Serum or Plasma (CHEM) - Home Health Please Draw - (ICD10 L89.224 - Pressure ulcer of left hip, stage 4) LOINC Code: 1988-5 Convenience Name: C Reactive Protein in serum or plasma CBC W A Differential panel in Blood (HEM-CBC) - Home Health Please Draw - (ICD10 L89.224 - Pressure ulcer of left hip, stage 4) uto LOINC Code: 40086-7 Convenience Name: CBC W Auto Differential panel Basic metabolic 6195 panel in Serum or Plasma (CHEM-panel) - Home Health Please Draw - (ICD10 L89.224 - Pressure ulcer of left hip, stage 4) LOINC Code: 09326-7 Convenience Name: Basic Metabolic  panelCMS Erythrocyte sedimentation rate (HEM) - Home Health Please Draw - (ICD10 L89.224 - Pressure ulcer of left hip, stage 4) LOINC Code: 12458-0 Convenience Name: Sed rate-method unspecified Radiology X-ray, coccyx/sacral - X-ray of the sacral/coccyx, non-healing wound - (ICD10 L89.154 - Pressure ulcer of sacral region, stage 4) X-ray, Left Hip - X-ray of the left trochanter, non-healing wound - (ICD10 D98.338 - Pressure ulcer of left hip, stage 4) Patient Medications llergies: penicillin A Notifications Medication Indication Start End wound infection 05/01/2020 sulfamethoxazole-trimethoprim DOSE oral 200 mg/5 mL-40 mg/5 mL suspension - suspension oral 29m=800mg bid Electronic Signature(s) Signed: 05/01/2020 1:45:46 PM By: RLinton HamMD Entered By: RLinton Hamon 05/01/2020 13:45:45 Prescription 05/01/2020 -------------------------------------------------------------------------------- EEthlyn GalleryMD Patient Name: Provider: [id number] of Birth: NPI#:Dillard EssexSex: DEA #: [phone] #: License #: [place]Patient Address: [address], [address] [phone] penicillin Provider's Orders C reactive protein [Mass/volume] in Serum or Plasma - ICD10: L89.224 - Home Health Please Draw LOINC Code: 1988-5 Convenience Name: C Reactive Protein in serum or plasma Hand Signature: Date(s): Prescription 05/01/2020 EEthlyn GalleryMD Patient Name: Provider: [id number] of Birth: NPI#: MJerilynn [id number]: DEA #: [id number] #: License #: [place]Patient Address: [address], [place] [phone] penicillin Provider's Orders CBC W  A Differential panel in Blood - ICD10: L89.224 - Home Health Please Draw uto LOINC Code: 536629-4CTuliaName: CBC W Auto Differential panel Hand Signature: Date(s): Prescription 05/01/2020 EEthlyn GalleryMD Patient Name: Provider: [id number] of Birth: NPI#: MJerilynn [id number]: DEA #: [id number] #: License #: [place]at[place]Patient Address: [address] [place] [address], [place] [phone] penicillin Provider's Orders Basic metabolic 20923panel in Serum or Plasma - ICD10: L89.224 - Home Health Please Draw LOINC Code: 24321-2 Convenience Name: Basic Metabolic panelCMS Hand  Signature: Date(s): Prescription 05/01/2020 Ethlyn Gallery MD Patient Name: Provider: [birth year] [id number] Date of Birth: NPI#: Jerilynn Mages [id number] Sex: DEA #: [phone] [id number] Phone #: License #: [place] Patient Address: [address] [address] , [place], [place] [address] [phone] Allergies penicillin Provider's Orders Erythrocyte sedimentation rate - ICD10: L89.224 - Home Health Please Draw LOINC Code: 89211-9 Convenience Name: Sed rate-method unspecified Hand Signature: Date(s): Prescription 05/01/2020 Ethlyn Gallery MD Patient Name: Provider: 1965-09-02 [id number] Date of Birth: NPI#: Jerilynn Mages [id number] Sex: DEA #: [phone] Phone #: License #: [place] Patient Address: [address] [place] [address] , [place], [place] [address] [phone] Allergies penicillin Provider's Orders X-ray, coccyx/sacral - ICD10: L89.154 - X-ray of the sacral/coccyx, non-healing wound Hand Signature: Date(s): Prescription 05/01/2020 Ethlyn Gallery  MD Patient Name: Provider: [birth year] [id number] Date of Birth: NPI#: Jerilynn Mages [id number] Sex: DEA #: [id number] Phone #: License #: [place] Patient Address: [address] [place] [address] , [place], [place] [address] [phone] Allergies penicillin Provider's Orders X-ray, Left Hip - ICD10: L89.224 - X-ray of the left trochanter, non-healing wound Hand Signature: Date(s): Electronic Signature(s) Signed: 05/04/2020 7:26:40 PM By: Linton Ham MD Entered By: Linton Ham on 05/01/2020 13:45:47 -------------------------------------------------------------------------------- Problem List Details Patient Name: Date of Service: AUBERT, CHOYCE 05/01/2020 12:30 PM Medical Record Number: [id number] Patient Account Number: [id number] Date of Birth/Sex: Treating RN: [birth year] (54 y.o. Marvis Repress Primary Care Provider: Other Clinician: Anastasia Pall Referring Provider: Treating Provider/Extender: Wende Mott in Treatment: 4 Active Problems ICD-10 Encounter Code Description Active Date MDM Diagnosis Q90.9 Down syndrome, unspecified 04/03/2020 No Yes L89.224 Pressure ulcer of left hip, stage 4 04/10/2020 No Yes L89.154 Pressure ulcer of sacral region, stage 4 04/10/2020 No Yes L03.116 Cellulitis of left lower limb 04/10/2020 No Yes L89.012 Pressure ulcer of right elbow, stage 2 04/17/2020 No Yes Inactive Problems Resolved Problems Electronic Signature(s) Signed: 05/04/2020 7:26:40 PM By: Linton Ham MD Entered By: Linton Ham on 05/01/2020 13:57:06 -------------------------------------------------------------------------------- Progress Note Details Patient Name: Date of Service: BLAKELY, GLUTH 05/01/2020 12:30 PM Medical Record Number: [id number] Patient Account Number: [id number] Date of Birth/Sex: Treating RN: April 17, 1966 (54 y.o. Marvis Repress Primary Care Provider: Anastasia Pall Other Clinician: Referring Provider: Treating Provider/Extender: Wende Mott in Treatment: 4 Subjective History of Present Illness (HPI) 54 year old Down syndrome patient who unfortunately developed 2 deep tissue pressure wounds while recuperating at skilled nursing facility in May, patient was admitted to [facility] health for syncopal event in April and after hospitalization was discharged to skilled nursing facility, his work-up included Doppler studies that showed right internal carotid artery stenosis for which she was placed on Plavix. After being at the skilled nursing facility patient developed 2 wounds and was admitted in June for an 8-day hospital stay for wound care. 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 He had a pelvic CT that showed ulceration extending along the right buttock into the medial margin of the gluteus maximus muscle without signs of osteomyelitis. Patient had wound care with bedside debridement and discharge to different facility at discharge. He is currently at a group home where he  is getting home health to assist with wound care especially with central and daily dressing changes. And referred to the wound clinic for further care. Patient is nonverbal, and cannot follow instructions, sister and caregiver are present at bedside. According to them the wounds look "better" than before considering that he had black eschar covering the wounds and had some debridement performed that remove this layer. Patient's currently on iron supplements, Plavix, nutritional supplements, lipid-lowering agent. 04/10/2020; this is a patient with 2 stage IV wounds in the lower sacrum and the left greater trochanter. He required debridement last week. They have been using Santyl but apparently they ran out of this and Medicaid would not cover a  represcription. The patient has Down syndrome. In talking with his sister he is no longer verbal and nonambulatory this is a big deterioration over the last year. She states that she thinks this was a result of the first shot of pneumonia during a vaccine last January although I think he was having some problem before that. His caregiver is also here. Apparently eats and drinks well they crush his medications. They have an eggcrate pressure relief surface 8/27; left greater trochanter and lower sacrum both stage IV pressure ulcers in this very frail man with advanced Down syndrome likely underlying Alzheimer's disease. He apparently is eating and drinking well per his caregiver. They have some form of pressure relief bed. I put him on antibiotics last week because of erythema around the greater trochanter but I did not culture. He has a new superficial area over his left elbow 9/10; this is a patient who is very disabled. Presumably Down syndrome. It does not sound talking to his caregiver that he was ever that functional but he is currently better chair bound flexion contractures minimally verbal. 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 According to his caregiver she eats and drinks well. We have been using silver alginate to the wounds. I gave him a course of Levaquin 3 weeks ago empirically without cultures. His wound especially over the greater trochanter is worse today Objective Constitutional Sitting or standing Blood Pressure is within target range for patient.. Pulse regular and within target range for patient.Marland Kitchen Respirations regular, non-labored and within target range.. Temperature is normal and within the target range for the patient.Marland Kitchen Appears in no distress. Vitals Time Taken: 1:08 PM, Height: 60 in, Weight: 103 lbs, BMI: 20.1, Temperature: 98 F, Pulse: 88 bpm, Respiratory Rate: 20 breaths/min, Blood Pressure: 100/68  mmHg. Respiratory work of breathing is normal. Gastrointestinal (GI) Abdomen is soft and non-distended without masses or tenderness.. Genitourinary (GU) Bladder is not distended. Psychiatric The patient is minimally verbal occasional one-word answers.. General Notes: Wound exam ooThe area over the left greater trochanter is deteriorated since I last saw this. Necrotic debris with odor and serosanguineous drainage. I have cultured this removed some necrotic subcutaneous tissue. There is exposed tendon but no exposed bone. Significant undermining ooThe sacral wound is not as bad although there is tender erythema around this as well Integumentary (Hair, Skin) Wound #1 status is Open. Original cause of wound was Gradually Appeared. The wound is located on the Left Trochanter. The wound measures 3cm length x 3cm width x 3.5cm depth; 7.069cm^2 area and 24.74cm^3 volume. There is muscle, tendon, and Fat Layer (Subcutaneous Tissue) exposed. There is no tunneling noted, however, there is undermining starting at 7:00 and ending at 1:00 with a maximum distance of 8.5cm. There is a medium  amount of serosanguineous drainage noted. The wound margin is well defined and not attached to the wound base. There is small (1-33%) red, pink granulation within the wound bed. There is a large (67-100%) amount of necrotic tissue within the wound bed including Eschar and Adherent Slough. Wound #2 status is Open. Original cause of wound was Gradually Appeared. The wound is located on the Sacrum. The wound measures 6.5cm length x 2.5cm width x 1.2cm depth; 12.763cm^2 area and 15.315cm^3 volume. There is muscle, tendon, and Fat Layer (Subcutaneous Tissue) exposed. There is no tunneling noted, however, there is undermining starting at 7:00 and ending at 1:00 with a maximum distance of 3cm. There is a medium amount of serosanguineous drainage noted. The wound margin is well defined and not attached to the wound base. There  is medium (34-66%) pink granulation within the wound bed. There is a medium (34-66%) amount of necrotic tissue within the wound bed including Eschar and Adherent Slough. Wound #3 status is Open. Original cause of wound was Shear/Friction. The wound is located on the Right Elbow. The wound measures 0.4cm length x 0.2cm width x 0.1cm depth; 0.063cm^2 area and 0.006cm^3 volume. There is Fat Layer (Subcutaneous Tissue) exposed. There is no tunneling noted, however, there is undermining starting at 7:00 and ending at 1:00 with a maximum distance of 3.5cm. There is a medium amount of serosanguineous drainage noted. There is large (67-100%) red granulation within the wound bed. There is no necrotic tissue within the wound bed. Assessment Active Problems ICD-10 Down syndrome, unspecified Pressure ulcer of left hip, stage 4 Pressure ulcer of sacral region, stage 4 Cellulitis of left lower limb Pressure ulcer of right elbow, stage 2 Procedures Wound #1 Pre-procedure diagnosis of Wound #1 is a Pressure Ulcer located on the Left Trochanter . There was a Excisional Skin/Subcutaneous Tissue Debridement with a total area of 4 sq cm performed by Ricard Dillon., MD. With the following instrument(s): Forceps, and Scissors to remove Viable and Non-Viable tissue/material. Material removed includes Subcutaneous Tissue after achieving pain control using Other (benzocaine, 20%). 1 specimen was taken by a Swab and sent to the lab per facility protocol. A time out was conducted at 13:20, prior to the start of the procedure. A Minimum amount of bleeding was controlled with Pressure. The procedure was tolerated well with a pain level of 2 throughout and a pain level of 0 following the procedure. Post Debridement Measurements: 3cm length x 3cm width x 3.5cm depth; 24.74cm^3 volume. Post debridement Stage noted as Category/Stage IV. Character of Wound/Ulcer Post Debridement requires further debridement. Post procedure  Diagnosis Wound #1: Same as Pre-Procedure Plan Follow-up Appointments: Return Appointment in 2 weeks. - Staff transfers Other: - take antibiotics as prescribed Dressing Change Frequency: Wound #1 Left Trochanter: Change dressing every day. Wound #2 Sacrum: Change dressing every day. Wound #3 Right Elbow: Change Dressing every other day. Skin Barriers/Peri-Wound Care: Wound #1 Left Trochanter: Skin Prep Wound #2 Sacrum: Skin Prep Wound Cleansing: Wound #1 Left Trochanter: Clean wound with Wound Cleanser Wound #2 Sacrum: Clean wound with Wound Cleanser Wound #3 Right Elbow: Clean wound with Wound Cleanser Primary Wound Dressing: Wound #1 Left Trochanter: Calcium Alginate with Silver Wound #2 Sacrum: Calcium Alginate with Silver Wound #3 Right Elbow: Xeroform Secondary Dressing: Wound #1 Left Trochanter: Foam Border - or ABD pad and tape Wound #2 Sacrum: Foam Border - or ABD pad and tape Wound #3 Right Elbow: Foam Border Off-Loading: Turn and reposition every 2 hours Home Health:  [place] skilled nursing for wound care. - [facility] Laboratory ordered were: Anerobic culture - Left trochanter wound, C Reactive Protein in serum or plasma - Home Health Please Draw, CBC W Auto Differential panel - Home Health Please Draw, Basic Metabolic panel CMS - Home Health Please Draw, Sed rate -method unspecified - Home Health Please Draw Radiology ordered were: X-ray, coccyx/sacral - X-ray of the sacral/coccyx, non-healing wound, X-ray, Left Hip - X-ray of the left trochanter, non-healing wound The following medication(s) was prescribed: sulfamethoxazole-trimethoprim oral 200 mg/5 mL-40 mg/5 mL suspension suspension oral 63m=800mg bid for wound infection starting 05/01/2020 1. I think the wound over the greater trochanter is infected. I have done a culture empiric trimethoprim sulfamethoxazole suspension 800 mg twice a day for 7 days while awaiting cultures 2. I am going  to try to get lab work on him. 3. I have also sent him down for x-rays of the left greater trochanter and sacrum 4. Once again the caregiver brought up palliative care or hospice. Patient is advertised as having Down syndrome if so there is an almost 100% incidence of Alzheimer's disease as the space into age but I am not sure that this is the only issue here. In any case he is total care contractured minimally verbal. He is apparently eating and drinking well. He is doubly incontinent. 5. I am not the patient's primary caregiver. I have not met his father who is his responsible party. I would be prepared to have a conversation with him over the phone. I am trying to keep him out of the hospital however I am not certain that is going to be possible. He would certainly be reasonable for hospice although I am not sure I am the best person to have this conversation Electronic Signature(s) Signed: 05/04/2020 7:26:40 PM By: RLinton HamMD Entered By: RLinton Hamon 05/01/2020 14:02:15 -------------------------------------------------------------------------------- SuperBill Details Patient Name: Date of Service: ESTUART, MIRABILE9/05/2020 Medical Record Number: [id number] Account Number: [id number] of Birth/Sex: Treating RN: 405/23/1967(54y.o. 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

## 2020-05-04 NOTE — Telephone Encounter (Signed)
Tracy Todd,patient was discharged from [facility] I'm no longer in charge of the patient.patient will need to call her PCP for medication refill.

## 2020-05-15 ENCOUNTER — Other Ambulatory Visit (HOSPITAL_COMMUNITY): Payer: Self-pay | Admitting: Internal Medicine

## 2020-05-15 ENCOUNTER — Encounter (HOSPITAL_BASED_OUTPATIENT_CLINIC_OR_DEPARTMENT_OTHER): Payer: Medicare Other | Admitting: Internal Medicine

## 2020-05-15 ENCOUNTER — Other Ambulatory Visit: Payer: Self-pay | Admitting: Internal Medicine

## 2020-05-15 ENCOUNTER — Other Ambulatory Visit: Payer: Self-pay

## 2020-05-15 DIAGNOSIS — L89224 Pressure ulcer of left hip, stage 4: Secondary | ICD-10-CM

## 2020-05-15 DIAGNOSIS — M86152 Other acute osteomyelitis, left femur: Secondary | ICD-10-CM | POA: Diagnosis not present

## 2020-05-15 NOTE — Progress Notes (Signed)
Tracy Todd, Tracy Todd ([id number]) Visit Report for 05/15/2020 Arrival Information Details Patient Name: Date of Service: JARL, SELLITTO 05/15/2020 12:30 PM Medical Record Number: [id number] Patient Account Number: [id number] Date of Birth/Sex: Treating RN: 09-29-1965 (54 y.o. Judie Petit) Yevonne Pax Primary Care Nawaal Alling: Antony Haste Other Clinician: Referring Kishon Garriga: Treating Briann Sarchet/Extender: Murtis Sink in Treatment: 6 Visit Information History Since Last Visit All ordered tests and consults were completed: No Patient Arrived: Wheel Chair Added or deleted any medications: No Arrival Time: 12:56 Any new allergies or adverse reactions: No Accompanied By: caregiver Had a fall or experienced change in No Transfer Assistance: Manual activities of daily living that may affect Patient Identification Verified: Yes risk of falls: Secondary Verification Process Completed: Yes Signs or symptoms of abuse/neglect since last visito No Patient Requires Transmission-Based Precautions: No Hospitalized since last visit: No Patient Has Alerts: No Implantable device outside of the clinic excluding No cellular tissue based products placed in the center since last visit: Has Dressing in Place as Prescribed: Yes Pain Present Now: No Electronic Signature(s) Signed: 05/15/2020 4:51:09 PM By: Yevonne Pax RN Entered By: Yevonne Pax on 05/15/2020 13:08:51 -------------------------------------------------------------------------------- Clinic Level of Care Assessment Details Patient Name: Date of Service: CAYDIN, YEATTS 05/15/2020 12:30 PM Medical Record Number: [id number] Patient Account Number: [id number] Date of Birth/Sex: Treating RN: 10-Jun-1966 (54 y.o. [name] Primary Care Rodolfo Notaro: Antony Haste Other Clinician: Referring Ashleigh Luckow: Treating Caelan Atchley/Extender: Murtis Sink in Treatment: 6 Clinic Level of Care Assessment Items TOOL 4  Quantity Score X- 1 0 Use when only an EandM is performed on FOLLOW-UP visit ASSESSMENTS - Nursing Assessment / Reassessment X- 1 10 Reassessment of Co-morbidities (includes updates in patient status) X- 1 5 Reassessment of Adherence to Treatment Plan ASSESSMENTS - Wound and Skin A ssessment / Reassessment []  - 0 Simple Wound Assessment / Reassessment - one wound X- 2 5 Complex Wound Assessment / Reassessment - multiple wounds []  - 0 Dermatologic / Skin Assessment (not related to wound area) ASSESSMENTS - Focused Assessment []  - 0 Circumferential Edema Measurements - multi extremities []  - 0 Nutritional Assessment / Counseling / Intervention []  - 0 Lower Extremity Assessment (monofilament, tuning fork, pulses) []  - 0 Peripheral Arterial Disease Assessment (using hand held doppler) ASSESSMENTS - Ostomy and/or Continence Assessment and Care []  - 0 Incontinence Assessment and Management []  - 0 Ostomy Care Assessment and Management (repouching, etc.) PROCESS - Coordination of Care []  - 0 Simple Patient / Family Education for ongoing care X- 1 20 Complex (extensive) Patient / Family Education for ongoing care X- 1 10 Staff obtains , Records, T Results / Process Orders est X- 1 10 Staff telephones HHA, Nursing Homes / Clarify orders / etc []  - 0 Routine Transfer to another Facility (non-emergent condition) []  - 0 Routine Hospital Admission (non-emergent condition) []  - 0 New Admissions / / Ordering NPWT Apligraf, etc. , []  - 0 Emergency Hospital Admission (emergent condition) X- 1 10 Simple Discharge Coordination []  - 0 Complex (extensive) Discharge Coordination PROCESS - Special Needs []  - 0 Pediatric / Minor Patient Management []  - 0 Isolation Patient Management []  - 0 Hearing / Language / Visual special needs []  - 0 Assessment of Community assistance (transportation, D/C planning, etc.) []  - 0 Additional assistance / Altered  mentation []  - 0 Support Surface(s) Assessment (bed, cushion, seat, etc.) INTERVENTIONS - Wound Cleansing / Measurement []  - 0 Simple Wound Cleansing - one wound X- 2 5 Complex Wound Cleansing - multiple wounds  X- 1 5 Wound Imaging (photographs - any number of wounds) []  - 0 Wound Tracing (instead of photographs) []  - 0 Simple Wound Measurement - one wound X- 2 5 Complex Wound Measurement - multiple wounds INTERVENTIONS - Wound Dressings []  - 0 Small Wound Dressing one or multiple wounds X- 2 15 Medium Wound Dressing one or multiple wounds []  - 0 Large Wound Dressing one or multiple wounds X- 1 5 Application of Medications - topical []  - 0 Application of Medications - injection INTERVENTIONS - Miscellaneous []  - 0 External ear exam []  - 0 Specimen Collection (cultures, biopsies, blood, body fluids, etc.) []  - 0 Specimen(s) / Culture(s) sent or taken to Lab for analysis X- 1 10 Patient Transfer (multiple staff / Nurse, adultHoyer Lift / Similar devices) []  - 0 Simple Staple / Suture removal (25 or less) []  - 0 Complex Staple / Suture removal (26 or more) []  - 0 Hypo / Hyperglycemic Management (close monitor of Blood Glucose) []  - 0 Ankle / Brachial Index (ABI) - do not check if billed separately X- 1 5 Vital Signs Has the patient been seen at the hospital within the last three years: Yes Total Score: 150 Level Of Care: New/Established - Level 4 Electronic Signature(s) Signed: 05/15/2020 5:26:06 PM By: Cherylin Mylarwiggins, Shannon Entered By: Cherylin Mylarwiggins, Shannon on 05/15/2020 13:33:54 -------------------------------------------------------------------------------- Encounter Discharge Information Details Patient Name: Date of Service: Ernesta AmbleLLER, Haralambos 05/15/2020 12:30 PM Medical Record Number: [id number] Patient Account Number: [id number] Date of Birth/Sex: Treating RN: 02/02/1966 (54 y.o. Tammy SoursM) Deaton, Bobbi Primary Care Izzabell Klasen: Antony HasteBadger, Michael Other Clinician: Referring Grey Schlauch: Treating  Lyncoln Maskell/Extender: Murtis Sinkobson, Michael Badger, Michael Weeks in Treatment: 6 Encounter Discharge Information Items Discharge Condition: Stable Ambulatory Status: Wheelchair Discharge Destination: Home Transportation: Private Auto Accompanied By: sister and caregiver Schedule Follow-up Appointment: Yes Clinical Summary of Care: Electronic Signature(s) Signed: 05/15/2020 5:24:54 PM By: Shawn Stalleaton, Bobbi Entered By: Shawn Stalleaton, Bobbi on 05/15/2020 14:03:38 -------------------------------------------------------------------------------- Multi-Disciplinary Care Plan Details Patient Name: Date of Service: Ernesta AmbleLLER, Heinrich 05/15/2020 12:30 PM Medical Record Number: [id number] Patient Account Number: [id number] Date of Birth/Sex: Treating RN: 02/02/1966 (54 y.o. Katherina RightM) Dwiggins, Shannon Primary Care Jannine Abreu: Antony HasteBadger, Michael Other Clinician: Referring Dallyn Bergland: Treating Kalista Laguardia/Extender: Murtis Sinkobson, Michael Badger, Michael Weeks in Treatment: 6 Active Inactive Pain, Acute or Chronic Nursing Diagnoses: Pain, acute or chronic: actual or potential Goals: Patient/caregiver will verbalize adequate pain control between visits Date Initiated: 04/03/2020 Target Resolution Date: 06/12/2020 Goal Status: Active Interventions: Provide education on pain management Notes: Pressure Nursing Diagnoses: Knowledge deficit related to management of pressures ulcers Goals: Patient/caregiver will verbalize risk factors for pressure ulcer development Date Initiated: 04/03/2020 Target Resolution Date: 06/12/2020 Goal Status: Active Interventions: Provide education on pressure ulcers Notes: Electronic Signature(s) Signed: 05/15/2020 5:26:06 PM By: Cherylin Mylarwiggins, Shannon Entered By: Cherylin Mylarwiggins, Shannon on 05/15/2020 12:47:43 -------------------------------------------------------------------------------- Pain Assessment Details Patient Name: Date of Service: Ernesta AmbleLLER, Delmon 05/15/2020 12:30 PM Medical Record Number: [id number] Patient  Account Number: [id number] Date of Birth/Sex: Treating RN: 02/02/1966 (54 y.o. Melonie FloridaM) Epps, Carrie Primary Care Teonna Coonan: Antony HasteBadger, Michael Other Clinician: Referring Leonardo Plaia: Treating Lurine Imel/Extender: Murtis Sinkobson, Michael Badger, Michael Weeks in Treatment: 6 Active Problems Location of Pain Severity and Description of Pain Patient Has Paino No Site Locations Pain Management and Medication Current Pain Management: Electronic Signature(s) Signed: 05/15/2020 4:51:09 PM By: Yevonne PaxEpps, Carrie RN Entered By: Yevonne PaxEpps, Carrie on 05/15/2020 13:09:21 -------------------------------------------------------------------------------- Patient/Caregiver Education Details Patient Name: Date of Service: Ernesta AmbleELLER, Dedrick 9/24/2021andnbsp12:30 PM Medical Record Number: [id number] Patient Account Number: [id number] Date of Birth/Gender: Treating RN: 02/02/1966 (54 y.o. Katherina RightM) Dwiggins, Shannon Primary  Care Physician: Antony Haste Other Clinician: Referring Physician: Treating Physician/Extender: Murtis Sink in Treatment: 6 Education Assessment Education Provided To: Patient Education Topics Provided Pain: Handouts: A Guide to Pain Control Methods: Explain/Verbal Responses: State content correctly Pressure: Handouts: Pressure Ulcers: Care and Offloading Methods: Explain/Verbal Responses: State content correctly Electronic Signature(s) Signed: 05/15/2020 5:26:06 PM By: Cherylin Mylar Entered By: Cherylin Mylar on 05/15/2020 12:48:03 -------------------------------------------------------------------------------- Wound Assessment Details Patient Name: Date of Service: LYAM, PROVENCIO 05/15/2020 12:30 PM Medical Record Number: [id number] Patient Account Number: [id number] Date of Birth/Sex: Treating RN: 1965-10-14 (54 y.o. Judie Petit) Yevonne Pax Primary Care Velita Quirk: Antony Haste Other Clinician: Referring Marylou Wages: Treating Nani Ingram/Extender: Murtis Sink in Treatment: 6 Wound Status Wound Number: 1 Primary Etiology: Pressure Ulcer Wound Location: Left Trochanter Wound Status: Open Wounding Event: Gradually Appeared Date Acquired: 12/21/2019 Weeks Of Treatment: 6 Clustered Wound: No Wound Measurements Length: (cm) 3.5 Width: (cm) 2.5 Depth: (cm) 2 Area: (cm) 6.872 Volume: (cm) 13.744 % Reduction in Area: 51.4% % Reduction in Volume: 19% Epithelialization: Small (1-33%) Tunneling: No Undermining: No Wound Description Classification: Category/Stage IV Wound Margin: Well defined, not attached Exudate Amount: Medium Exudate Type: Serosanguineous Exudate Color: red, brown Foul Odor After Cleansing: No Slough/Fibrino Yes Wound Bed Granulation Amount: Medium (34-66%) Exposed Structure Granulation Quality: Red, Pink Fascia Exposed: No Necrotic Amount: Medium (34-66%) Fat Layer (Subcutaneous Tissue) Exposed: Yes Necrotic Quality: Eschar, Adherent Slough Tendon Exposed: Yes Muscle Exposed: Yes Necrosis of Muscle: No Joint Exposed: No Bone Exposed: No Treatment Notes Wound #1 (Left Trochanter) 1. Cleanse With Wound Cleanser 2. Periwound Care Skin Prep 3. Primary Dressing Applied Calcium Alginate Ag 4. Secondary Dressing Foam Border Dressing 5. Secured With Advice worker) Signed: 05/15/2020 4:51:09 PM By: Yevonne Pax RN Entered By: Yevonne Pax on 05/15/2020 13:11:37 -------------------------------------------------------------------------------- Wound Assessment Details Patient Name: Date of Service: MARTON, MALIZIA 05/15/2020 12:30 PM Medical Record Number: [id number] Patient Account Number: [id number] Date of Birth/Sex: Treating RN: 08/13/1966 (54 y.o. Judie Petit) Yevonne Pax Primary Care Romie Keeble: Antony Haste Other Clinician: Referring Emannuel Vise: Treating Camaria Gerald/Extender: Murtis Sink in Treatment: 6 Wound Status Wound Number: 2 Primary  Etiology: Pressure Ulcer Wound Location: Sacrum Wound Status: Open Wounding Event: Gradually Appeared Date Acquired: 12/21/2019 Weeks Of Treatment: 6 Clustered Wound: No Wound Measurements Length: (cm) 6 Width: (cm) 4.5 Depth: (cm) 1 Area: (cm) 21.206 Volume: (cm) 21.206 % Reduction in Area: -50% % Reduction in Volume: -15.4% Epithelialization: None Tunneling: No Undermining: No Wound Description Classification: Category/Stage IV Wound Margin: Well defined, not attached Exudate Amount: Medium Exudate Type: Serosanguineous Exudate Color: red, brown Foul Odor After Cleansing: No Slough/Fibrino Yes Wound Bed Granulation Amount: Large (67-100%) Exposed Structure Granulation Quality: Pink Fascia Exposed: No Necrotic Amount: Small (1-33%) Fat Layer (Subcutaneous Tissue) Exposed: Yes Necrotic Quality: Eschar, Adherent Slough Tendon Exposed: Yes Muscle Exposed: Yes Necrosis of Muscle: No Joint Exposed: No Bone Exposed: No Treatment Notes Wound #2 (Sacrum) 1. Cleanse With Wound Cleanser 2. Periwound Care Skin Prep 3. Primary Dressing Applied Calcium Alginate Ag 4. Secondary Dressing Foam Border Dressing 5. Secured With Advice worker) Signed: 05/15/2020 4:51:09 PM By: Yevonne Pax RN Entered By: Yevonne Pax on 05/15/2020 13:10:51 -------------------------------------------------------------------------------- Wound Assessment Details Patient Name: Date of Service: KEIRON, IODICE 05/15/2020 12:30 PM Medical Record Number: [id number] Patient Account Number: [id number] Date of Birth/Sex: Treating RN: 07-31-1966 (54 y.o. Judie Petit) Yevonne Pax Primary Care Nalini Alcaraz: Antony Haste Other Clinician: Referring Caree Wolpert: Treating Tyrica Afzal/Extender: Murtis Sink  in Treatment: 6 Wound Status Wound Number: 3 Primary Etiology: Skin Tear Wound Location: Right Elbow Wound Status: Healed - Epithelialized Wounding Event:  Shear/Friction Date Acquired: 04/17/2020 Weeks Of Treatment: 4 Clustered Wound: No Wound Measurements Length: (cm) Width: (cm) Depth: (cm) Area: (cm) Volume: (cm) 0 % Reduction in Area: 100% 0 % Reduction in Volume: 100% 0 Epithelialization: Large (67-100%) 0 Tunneling: No 0 Undermining: No Wound Description Classification: Full Thickness Without Exposed Support Structures Exudate Amount: None Present Foul Odor After Cleansing: No Slough/Fibrino No Wound Bed Granulation Amount: None Present (0%) Exposed Structure Necrotic Amount: None Present (0%) Fascia Exposed: No Fat Layer (Subcutaneous Tissue) Exposed: No Tendon Exposed: No Muscle Exposed: No Joint Exposed: No Bone Exposed: No Electronic Signature(s) Signed: 05/15/2020 4:51:09 PM By: Yevonne Pax RN Signed: 05/15/2020 5:26:06 PM By: Cherylin Mylar Entered By: Cherylin Mylar on 05/15/2020 13:36:24 -------------------------------------------------------------------------------- Vitals Details Patient Name: Date of Service: TOMOYA, RINGWALD 05/15/2020 12:30 PM Medical Record Number: [id number] Patient Account Number: [id number] Date of Birth/Sex: Treating RN: 02/07/1966 (54 y.o. Lina Sar, Lyla Son Primary Care Angeleigh Chiasson: Antony Haste Other Clinician: Referring Simya Tercero: Treating Kaida Games/Extender: Murtis Sink in Treatment: 6 Vital Signs Time Taken: 13:08 Temperature (F): 97.5 Height (in): 60 Pulse (bpm): 64 Weight (lbs): 103 Respiratory Rate (breaths/min): 18 Body Mass Index (BMI): 20.1 Blood Pressure (mmHg): 90/50 Reference Range: 80 - 120 mg / dl Electronic Signature(s) Signed: 05/15/2020 4:51:09 PM By: Yevonne Pax RN Entered By: Yevonne Pax on 05/15/2020 13:09:14

## 2020-05-17 NOTE — Progress Notes (Signed)
Tracy Todd, Ballard ([id number]) Visit Report for 05/15/2020 HPI Details Patient Name: Date of Service: Tracy Todd, Tracy Todd 05/15/2020 12:30 PM Medical Record Number: [id number] Patient Account Number: [id number] Date of Birth/Sex: Treating RN: 1965/10/30 (54 y.o. M) Primary Care Provider: Antony HasteBadger, Bralen Wiltgen Other Clinician: Referring Provider: Treating Provider/Extender: Murtis Sinkobson, Anihya Tuma Badger, Kitana Gage Weeks in Treatment: 6 History of Present Illness HPI Description: [id number] year old Down syndrome patient who unfortunately developed 2 deep tissue pressure wounds while recuperating at skilled nursing facility in May, [facility] in April and after hospitalization was discharged to skilled nursing facility, his work-up included Doppler studies that showed right internal carotid artery stenosis for which she was placed on Plavix. After being at the skilled nursing facility patient developed 2 wounds and was admitted in June for an 8-day hospital stay for wound care. 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 He had a pelvic CT that showed ulceration extending along the right buttock into the medial margin of the gluteus maximus muscle without signs of osteomyelitis. Patient had wound care with bedside debridement and discharge to different facility at discharge. He is currently at a group home where he is getting home health to assist with wound care especially with central and daily dressing changes. And referred to the wound clinic for further care. Patient is nonverbal, and cannot follow instructions, sister and caregiver are present at bedside. According to them the wounds look "better" than before considering that he had black eschar covering the wounds and had some debridement performed that remove this layer. Patient's currently on iron supplements, Plavix, nutritional supplements,  lipid-lowering agent. 04/10/2020; this is a patient with 2 stage IV wounds in the lower sacrum and the left greater trochanter. He required debridement last week. They have been using Santyl but apparently they ran out of this and Medicaid would not cover a represcription. The patient has Down syndrome. In talking with his sister he is no longer verbal and nonambulatory this is a big deterioration over the last year. She states that she thinks this was a result of the first shot of pneumonia during a vaccine last January although I think he was having some problem before that. His caregiver is also here. Apparently eats and drinks well they crush his medications. They have an eggcrate pressure relief surface 8/27; left greater trochanter and lower sacrum both stage IV pressure ulcers in this very frail man with advanced Down syndrome likely underlying Alzheimer's disease. He apparently is eating and drinking well per his caregiver. They have some form of pressure relief bed. I put him on antibiotics last week because of erythema around the greater trochanter but I did not culture. He has a new superficial area over his left elbow 9/10; this is a patient who is very disabled. Presumably Down syndrome. It does not sound talking to his caregiver that he was ever that functional but he is currently better chair bound flexion contractures minimally verbal. 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 According to his caregiver she eats and drinks well. We have been using silver alginate to the wounds. I gave him a course of Levaquin 3 weeks ago empirically without cultures. His wound especially over the greater trochanter is worse today 9/24; 2-week follow-up. Patient's x-ray suggested acute osteomyelitis in the region of the proximal left femur and greater trochanter. They recommended an  MRI but I do not think we could get the patient through an MRI I have ordered  a CT scan with contrast. Culture grew E. coli that was resistant to trimethoprim sulfamethoxazole and quinolones. I have him on cefdinir which started on 9/13. They have 1 more tablet I am likely going to extend this Lab work I did on this man showed a white count of 3.82 with a reasonably normal differential. His C-reactive protein was elevated at 54.4 and sedimentation rate at 89. The rest the lab work was normal. Unfortunately they did not do a serum albumin I took some time today to talk to the patient's sister who is present about advanced medical directives. The patient has flexion contractures, barely verbal although he is eating eating and drinking well according to his caregiver. I asked him to think about the quality of this person day vis--vis the aggressiveness of medical care. She has a sister and the patient's father they promised to sit down and give [place] some sense of direction here. Electronic Signature(s) Signed: 05/17/2020 6:30:39 AM By: Baltazar Najjar MD Entered By: Baltazar Najjar on 05/15/2020 13:59:02 -------------------------------------------------------------------------------- Physical Exam Details Patient Name: Date of Service: Tracy Todd, Tracy Todd 05/15/2020 12:30 PM Medical Record Number: [id number] Patient Account Number: [id number] Date of Birth/Sex: Treating RN: 1965/10/30 (54 y.o. M) Primary Care Provider: Antony Haste Other Clinician: Referring Provider: Treating Provider/Extender: Murtis Sink in Treatment: 6 Constitutional Patient is hypotensive.. Pulse regular and within target range for patient.Marland Kitchen Respirations regular, non-labored and within target range.. Temperature is normal and within the target range for the patient.Marland Kitchen Appears in no distress. Respiratory work of breathing is normal. Cardiovascular Appears euvolemic. Psychiatric Appears at baseline otherwise difficult to assess. Notes Wound exam The sacral wound is about the  same. There is no palpable bone here no tenderness around the wound The real problem is in the wound over the left greater trochanter. This looks somewhat better than when I saw 2 weeks ago. The tissue is less necrotic looking and there is certainly less drainage. This still probes precariously close to bone itself although it is quite a bit improved Electronic Signature(s) Signed: 05/17/2020 6:30:39 AM By: Baltazar Najjar MD Entered By: Baltazar Najjar on 05/15/2020 13:56:40 -------------------------------------------------------------------------------- Physician Orders Details Patient Name: Date of Service: Tracy Todd, Tracy Todd 05/15/2020 12:30 PM Medical Record Number: [id number] Patient Account Number: [id number] Date of Birth/Sex: Treating RN: October 12, 1965 (54 y.o. [name] Primary Care Provider: Antony Haste Other Clinician: Referring Provider: Treating Provider/Extender: Murtis Sink in Treatment: 6 Verbal / Phone Orders: No Diagnosis Coding ICD-10 Coding Code Description Q90.9 Down syndrome, unspecified L89.224 Pressure ulcer of left hip, stage 4 L89.154 Pressure ulcer of sacral region, stage 4 L03.116 Cellulitis of left lower limb L89.012 Pressure ulcer of right elbow, stage 2 Follow-up Appointments ppointment in 2 weeks. - Staff transfers Return A Other: - take antibiotics as prescribed Dressing Change Frequency Wound #1 Left Trochanter Change dressing every day. Wound #2 Sacrum Change dressing every day. Skin Barriers/Peri-Wound Care Wound #1 Left Trochanter Skin Prep Wound #2 Sacrum Skin Prep Wound Cleansing Wound #1 Left Trochanter Clean wound with Wound Cleanser Wound #2 Sacrum Clean wound with Wound Cleanser Primary Wound Dressing Wound #1 Left Trochanter Calcium Alginate with Silver Wound #2 Sacrum Calcium Alginate with Silver Secondary Dressing Wound #1 Left Trochanter Foam Border - or ABD pad and tape Wound #2  Sacrum Foam Border - or ABD pad and tape Off-Loading Turn and reposition every 2 hours  Home Health Continue Home Health skilled nursing for wound care. - [facility] Computed Tomography (CT) Scan, Pelvis With Contrast - CT Scan of Pelvis, With Contrast. Non-healing wound to left trochanter (stage 4) and sacral (stage 4) - (ICD10 G95.621 - Pressure ulcer of left hip, stage 4) Patient Medications llergies: penicillin A Notifications Medication Indication Start End wouind infection 05/15/2020 cefdinir DOSE oral 300 mg capsule - 1 capsule oral bid for a further 10 days (continuing rx) Electronic Signature(s) Signed: 05/15/2020 2:03:03 PM By: Baltazar Najjar MD Entered By: Baltazar Najjar on 05/15/2020 14:03:02 Prescription 05/15/2020 -------------------------------------------------------------------------------- Starla Link MD Patient Name: Provider: 1965-10-15 [id number] Date of Birth: NPI#: Brantley Stage Sex: DEA #: [phone] [id number] Phone #: License #: Eligha Bridegroom [facility] Patient Address: [address] [facility] [address] [place] [address] , [address] [place], [place] [address] [phone] Allergies penicillin Provider's Orders Computed Tomography (CT) Scan, Pelvis With Contrast - ICD10: L89.224 - CT Scan of Pelvis, With Contrast. Non-healing wound to left trochanter (stage 4) and sacral (stage 4) Hand Signature: Date(s): Electronic Signature(s) Signed: 05/17/2020 6:30:39 AM By: Baltazar Najjar MD Entered By: Baltazar Najjar on 05/15/2020 14:03:03 -------------------------------------------------------------------------------- Problem List Details Patient Name: Date of Service: Tracy Todd, Tracy Todd 05/15/2020 12:30 PM Medical Record Number: [id number] Patient Account Number: [id number] Date of Birth/Sex: Treating RN: 1966-03-25 (54 y.o. [name] Primary Care Provider: Antony Haste Other  Clinician: Referring Provider: Treating Provider/Extender: Murtis Sink in Treatment: 6 Active Problems ICD-10 Encounter Code Description Active Date MDM Diagnosis [phone] Other acute osteomyelitis, left femur 05/15/2020 No Yes L89.224 Pressure ulcer of left hip, stage 4 04/10/2020 No Yes L89.154 Pressure ulcer of sacral region, stage 4 04/10/2020 No Yes Q90.9 Down syndrome, unspecified 04/03/2020 No Yes Inactive Problems ICD-10 Code Description Active Date Inactive Date L03.116 Cellulitis of left lower limb 04/10/2020 04/10/2020 L89.012 Pressure ulcer of right elbow, stage 2 04/17/2020 04/17/2020 Resolved Problems Electronic Signature(s) Signed: 05/17/2020 6:30:39 AM By: Baltazar Najjar MD Entered By: Baltazar Najjar on 05/15/2020 13:54:03 -------------------------------------------------------------------------------- Progress Note Details Patient Name: Date of Service: Tracy Todd, Tracy Todd 05/15/2020 12:30 PM Medical Record Number: [id number] Patient Account Number: [id number] Date of Birth/Sex: Treating RN: 28-Dec-1965 (54 y.o. M) Primary Care Provider: Antony Haste Other Clinician: Referring Provider: Treating Provider/Extender: Murtis Sink in Treatment: 6 Subjective History of Present Illness (HPI) Lab89 year old Down syndrome patient who unfortunately developed 2 deep tissue pressure wounds while recuperating at skilled nursing facility in May, patient was admitted to [facility] for syncopal event in April and after hospitalization was discharged to skilled nursing facility, his work-up included Doppler studies that showed right internal carotid artery stenosis for which she was placed on Plavix. After being at the skilled nursing facility patient developed 2 wounds and was admitted in June for an 8-day hospital stay for wound care. 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 He had a pelvic CT that showed ulceration extending along the right buttock into the medial margin of the gluteus maximus muscle without signs of osteomyelitis. Patient had wound care with bedside debridement and discharge to different facility at discharge. He is currently at a group home where he is getting home health to assist with wound care especially with central and daily dressing changes. And referred to the wound clinic for further care. Patient is nonverbal, and cannot follow instructions, sister and caregiver  are present at bedside. According to them the wounds look "better" than before considering that he had black eschar covering the wounds and had some debridement performed that remove this layer. Patient's currently on iron supplements, Plavix, nutritional supplements, lipid-lowering agent. 04/10/2020; this is a patient with 2 stage IV wounds in the lower sacrum and the left greater trochanter. He required debridement last week. They have been using Santyl but apparently they ran out of this and Medicaid would not cover a represcription. The patient has Down syndrome. In talking with his sister he is no longer verbal and nonambulatory this is a big deterioration over the last year. She states that she thinks this was a result of the first shot of pneumonia during a vaccine last January although I think he was having some problem before that. His caregiver is also here. Apparently eats and drinks well they crush his medications. They have an eggcrate pressure relief surface 8/27; left greater trochanter and lower sacrum both stage IV pressure ulcers in this very frail man with advanced Down syndrome likely underlying Alzheimer's disease. He apparently is eating and drinking well per his caregiver. They have some form of pressure relief bed. I put him on antibiotics last week because of erythema around the greater trochanter but I did not culture. He has a new  superficial area over his left elbow 9/10; this is a patient who is very disabled. Presumably Down syndrome. It does not sound talking to his caregiver that he was ever that functional but he is currently better chair bound flexion contractures minimally verbal. 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 According to his caregiver she eats and drinks well. We have been using silver alginate to the wounds. I gave him a course of Levaquin 3 weeks ago empirically without cultures. His wound especially over the greater trochanter is worse today 9/24; 2-week follow-up. Patient's x-ray suggested acute osteomyelitis in the region of the proximal left femur and greater trochanter. They recommended an MRI but I do not think we could get the patient through an MRI I have ordered a CT scan with contrast. Culture grew E. coli that was resistant to trimethoprim sulfamethoxazole and quinolones. I have him on cefdinir which started on 9/13. They have 1 more tablet I am likely going to extend this Lab work I did on this man showed a white count of 3.82 with a reasonably normal differential. His C-reactive protein was elevated at 54.4 and sedimentation rate at 89. The rest the lab work was normal. Unfortunately they did not do a serum albumin I took some time today to talk to the patient's sister who is present about advanced medical directives. The patient has flexion contractures, barely verbal although he is eating eating and drinking well according to his caregiver. I asked him to think about the quality of this person day vis--vis the aggressiveness of medical care. She has a sister and the patient's father they promised to sit down and give [place] some sense of direction here. Objective Constitutional Patient is hypotensive.. Pulse regular and within target range for patient.Marland Kitchen Respirations regular, non-labored and within target range.. Temperature is normal and within  the target range for the patient.Marland Kitchen Appears in no distress. Vitals Time Taken: 1:08 PM, Height: 60 in, Weight: 103 lbs, BMI: 20.1, Temperature: 97.5 F, Pulse: 64 bpm, Respiratory Rate: 18 breaths/min, Blood Pressure: 90/50 mmHg. Respiratory work of breathing is normal. Cardiovascular Appears euvolemic. Psychiatric Appears at  baseline otherwise difficult to assess. General Notes: Wound exam ooThe sacral wound is about the same. There is no palpable bone here no tenderness around the wound ooThe real problem is in the wound over the left greater trochanter. This looks somewhat better than when I saw 2 weeks ago. The tissue is less necrotic looking and there is certainly less drainage. This still probes precariously close to bone itself although it is quite a bit improved Integumentary (Hair, Skin) Wound #1 status is Open. Original cause of wound was Gradually Appeared. The wound is located on the Left Trochanter. The wound measures 3.5cm length x 2.5cm width x 2cm depth; 6.872cm^2 area and 13.744cm^3 volume. There is muscle, tendon, and Fat Layer (Subcutaneous Tissue) exposed. There is no tunneling or undermining noted. There is a medium amount of serosanguineous drainage noted. The wound margin is well defined and not attached to the wound base. There is medium (34-66%) red, pink granulation within the wound bed. There is a medium (34-66%) amount of necrotic tissue within the wound bed including Eschar and Adherent Slough. Wound #2 status is Open. Original cause of wound was Gradually Appeared. The wound is located on the Sacrum. The wound measures 6cm length x 4.5cm width x 1cm depth; 21.206cm^2 area and 21.206cm^3 volume. There is muscle, tendon, and Fat Layer (Subcutaneous Tissue) exposed. There is no tunneling or undermining noted. There is a medium amount of serosanguineous drainage noted. The wound margin is well defined and not attached to the wound base. There is large (67-100%) pink  granulation within the wound bed. There is a small (1-33%) amount of necrotic tissue within the wound bed including Eschar and Adherent Slough. Wound #3 status is Healed - Epithelialized. Original cause of wound was Shear/Friction. The wound is located on the Right Elbow. The wound measures 0cm length x 0cm width x 0cm depth; 0cm^2 area and 0cm^3 volume. There is no tunneling or undermining noted. There is a none present amount of drainage noted. There is no granulation within the wound bed. There is no necrotic tissue within the wound bed. Assessment Active Problems ICD-10 Other acute osteomyelitis, left femur Pressure ulcer of left hip, stage 4 Pressure ulcer of sacral region, stage 4 Down syndrome, unspecified Plan Follow-up Appointments: Return Appointment in 2 weeks. - Staff transfers Other: - take antibiotics as prescribed Dressing Change Frequency: Wound #1 Left Trochanter: Change dressing every day. Wound #2 Sacrum: Change dressing every day. Skin Barriers/Peri-Wound Care: Wound #1 Left Trochanter: Skin Prep Wound #2 Sacrum: Skin Prep Wound Cleansing: Wound #1 Left Trochanter: Clean wound with Wound Cleanser Wound #2 Sacrum: Clean wound with Wound Cleanser Primary Wound Dressing: Wound #1 Left Trochanter: Calcium Alginate with Silver Wound #2 Sacrum: Calcium Alginate with Silver Secondary Dressing: Wound #1 Left Trochanter: Foam Border - or ABD pad and tape Wound #2 Sacrum: Foam Border - or ABD pad and tape Off-Loading: Turn and reposition every 2 hours Home Health: Continue Home Health skilled nursing for wound care. - [facility] ordered were: Computed Tomography (CT) Scan, Pelvis With Contrast - CT Scan of Pelvis, With Contrast. Non-healing wound to left trochanter (stage 4) and sacral (stage 4) The following medication(s) was prescribed: cefdinir oral 300 mg capsule 1 capsule oral bid for a further 10 days (continuing rx) for wouind infection  starting 05/15/2020 1. I continued with silver alginate to both wound areas for now. 2. I have extended his cefdinir 300 twice daily for an additional 10 days pending the CT scan results 3.  He is still tender around the wound on the greater trochanter over the soft tissue quite extensively. There is no crepitus. The surface of the wound here however looks better not as much necrosis and purulence I think the antibiotics have had some effect. The culture was E. coli but resistant to quinolones and trimethoprim sulfamethoxazole. The patient is penicillin allergic therefore is on a third-generation cephalosporin which fortunately he seems to be tolerating 4. CT scan with and without contrast 5. I have told the patient's sister that if he has osteomyelitis he may require a prolonged course of IV antibiotics. I also asked her about direction of care, aggressiveness of medical care etc. Electronic Signature(s) Signed: 05/15/2020 2:03:20 PM By: Baltazar Najjar MD Entered By: Baltazar Najjar on 05/15/2020 14:03:20 -------------------------------------------------------------------------------- SuperBill Details Patient Name: Date of Service: Tracy Todd, Tracy Todd 05/15/2020 Medical Record Number: [id number] Patient Account Number: [id number] Date of Birth/Sex: Treating RN: 09-Aug-1966 (54 y.o. [name] Primary Care Provider: Antony Haste Other Clinician: Referring Provider: Treating Provider/Extender: Murtis Sink in Treatment: 6 Diagnosis Coding ICD-10 Codes Code Description [phone] Other acute osteomyelitis, left femur L89.224 Pressure ulcer of left hip, stage 4 L89.154 Pressure ulcer of sacral region, stage 4 Q90.9 Down syndrome, unspecified Facility Procedures CPT4 Code: [id number] Description: 99214 - WOUND CARE VISIT-LEV 4 EST PT Modifier: Quantity: 1 Physician Procedures : CPT4 Code Description Modifier [id number] 99214 - WC PHYS LEVEL 4 - EST PT ICD-10  Diagnosis Description M86.152 Other acute osteomyelitis, left femur L89.224 Pressure ulcer of left hip, stage 4 L89.154 Pressure ulcer of sacral region, stage 4 Q90.9 Down  syndrome, unspecified Quantity: 1 Electronic Signature(s) Signed: 05/17/2020 6:30:39 AM By: Baltazar Najjar MD Entered By: Baltazar Najjar on 05/15/2020 14:03:46

## 2020-05-18 ENCOUNTER — Other Ambulatory Visit (HOSPITAL_BASED_OUTPATIENT_CLINIC_OR_DEPARTMENT_OTHER): Payer: Self-pay | Admitting: Internal Medicine

## 2020-05-18 ENCOUNTER — Other Ambulatory Visit: Payer: Self-pay | Admitting: Internal Medicine

## 2020-05-18 ENCOUNTER — Other Ambulatory Visit (HOSPITAL_COMMUNITY): Payer: Self-pay | Admitting: Internal Medicine

## 2020-05-18 DIAGNOSIS — L89224 Pressure ulcer of left hip, stage 4: Secondary | ICD-10-CM

## 2020-05-19 ENCOUNTER — Other Ambulatory Visit (HOSPITAL_BASED_OUTPATIENT_CLINIC_OR_DEPARTMENT_OTHER): Payer: Self-pay | Admitting: Internal Medicine

## 2020-05-19 DIAGNOSIS — L89224 Pressure ulcer of left hip, stage 4: Secondary | ICD-10-CM

## 2020-05-20 ENCOUNTER — Encounter: Payer: Self-pay | Admitting: *Deleted

## 2020-05-20 ENCOUNTER — Other Ambulatory Visit: Payer: Self-pay | Admitting: *Deleted

## 2020-05-21 ENCOUNTER — Encounter: Payer: Self-pay | Admitting: Neurology

## 2020-05-21 ENCOUNTER — Other Ambulatory Visit: Payer: Self-pay

## 2020-05-21 ENCOUNTER — Ambulatory Visit (INDEPENDENT_AMBULATORY_CARE_PROVIDER_SITE_OTHER): Payer: Medicare Other | Admitting: Neurology

## 2020-05-21 ENCOUNTER — Ambulatory Visit (HOSPITAL_BASED_OUTPATIENT_CLINIC_OR_DEPARTMENT_OTHER)
Admission: RE | Admit: 2020-05-21 | Discharge: 2020-05-21 | Disposition: A | Discharge status: Home / Self Care | Payer: Medicare Other | Source: Ambulatory Visit | Attending: Internal Medicine | Admitting: Internal Medicine

## 2020-05-21 VITALS — BP 87/63 | HR 98

## 2020-05-21 DIAGNOSIS — G40909 Epilepsy, unspecified, not intractable, without status epilepticus: Secondary | ICD-10-CM | POA: Diagnosis not present

## 2020-05-21 DIAGNOSIS — Q909 Down syndrome, unspecified: Secondary | ICD-10-CM | POA: Diagnosis not present

## 2020-05-21 DIAGNOSIS — L89224 Pressure ulcer of left hip, stage 4: Secondary | ICD-10-CM | POA: Insufficient documentation

## 2020-05-21 HISTORY — DX: Epilepsy, unspecified, not intractable, without status epilepticus: G40.909

## 2020-05-21 MED ORDER — ASPIRIN 81 MG PO TBEC: 81.0000 mg | DELAYED_RELEASE_TABLET | Freq: Every day | ORAL | 1 refills | Status: AC

## 2020-05-21 MED ORDER — IOHEXOL 300 MG/ML  SOLN
100.0000 mL | Freq: Once | INTRAMUSCULAR | Status: AC | PRN
  Administered 2020-05-21: 100 mL via INTRAVENOUS

## 2020-05-21 NOTE — Progress Notes (Signed)
Reason for visit: Seizures, Down syndrome, carotid occlusion  Referring physician: Dr. Gladstone Lighter is a 54 y.o. male  History of present illness:  Tracy Todd is a 54 year old white male with a history of Down syndrome with significant mental retardation.  The patient has been minimally verbal throughout his life.  He has been ambulatory however until April 2021.  Beginning around 09/16/2019, the patient has had events of loss of consciousness that were felt to be syncope in nature.  The episodes have been associated with falls, the patient had another event on 10/12/2019.  Another event occurred on 11/29/2019, he was admitted to the hospital at that time.  He had an EEG study that was unremarkable.  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  The patient was placed on Plavix.  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  The patient required admission to the hospital on 02/05/2020 for treatment of a pressure sore.  The patient went to [facility] rehab center and transitioned home at the beginning of July 2021.  While at home, he had a witnessed generalized jerking event, he was placed on Keppra at that time and has remained on 500 mg twice daily.  The patient has not had any further generalized events.  He is no longer ambulatory, he is again minimally verbal.  He is now incontinent of bowel and bladder.  He wears adult diapers.  He is eating well but he has had about a 30 pound weight loss.  He comes to this office for further evaluation.  Past Medical History:  Diagnosis Date  . Arthritis   . Down's syndrome   . Headache   . Hyperlipidemia   . Iron deficiency anemia   . Recurrent syncope   . Seizures (HCC)     No past surgical  history on file.  No family history on file.  Social history:  reports that he has never smoked. He has never used smokeless tobacco. He reports that he does not drink alcohol and does not use drugs.  Medications:  Prior to Admission medications   Medication Sig Start Date End Date Taking? Authorizing Provider  acetaminophen (TYLENOL) 500 MG tablet Take by mouth. 04/14/20 04/14/21  [provider]  Amino Acids-Protein Hydrolys (FEEDING SUPPLEMENT, PRO-STAT SUGAR FREE 64,) LIQD Take 30 mLs by mouth 2 (two) times daily. To aid in wound healing; resident prefers Prostat mixed in water. 02/14/20   [provider]  aspirin EC 81 MG EC tablet Take 1 tablet (81 mg total) by mouth daily. 12/07/19   Leatha Gilding, MD  benzocaine (AMERICAINE) 20 % oral spray Apply topically. 03/18/20 03/18/21  [provider]  bisacodyl (DULCOLAX) 10 MG suppository If not relieved by MOM, give 10 mg Bisacodyl suppositiory rectally X 1 dose in 24 hours as needed (Do not use constipation standing orders for residents with renal failure/CFR less than 30. Contact MD for orders) (Physician Order) 02/13/20   [provider]  cefdinir (OMNICEF) 300 MG capsule Take 300 mg by mouth 2 (two) times daily. 05/05/20   [provider]  cetirizine (ZYRTEC) 10 MG tablet Take 10 mg by mouth daily.    [provider]  ciprofloxacin (CIPRO)  500 MG tablet Take 500 mg by mouth 2 (two) times daily. 04/10/20   [provider]  diclofenac Sodium (VOLTAREN) 1 % GEL Apply 2 g topically 4 (four) times daily. To both knees 03/03/20   Ngetich, Dinah C, NP  Disposable Gloves MISC Use gloves for diaper changes.Dispense 2 boxes 03/19/20   [provider]  Ensure Plus (ENSURE PLUS) LIQD Take 237 mLs by mouth 2 (two) times daily between meals.    [provider]  escitalopram (LEXAPRO) 5 MG tablet Take 1 tablet (5 mg total) by mouth daily. 03/03/20   Ngetich, Dinah C, NP  FIBER ADULT  GUMMIES PO Take 1 tablet by mouth in the morning and at bedtime.     [provider]  finasteride (PROSCAR) 5 MG tablet Take 5 mg by mouth daily. 04/30/20   [provider]  hydrocortisone 2.5 % cream Apply to affected area 2 times daily 04/14/20 04/14/21  [provider]  Incontinence Supply Disposable (COMFORT SHIELD ADULT DIAPERS) MISC Dispense 5 boxes/mo. Use 4-6 times daily for fecal incontinence and diarrhea. 03/19/20   [provider]  Incontinence Supply Disposable (RA ADULT WIPES) MISC Please dispense 4 boxes/wipes per month. 03/19/20   [provider]  Incontinence Supply Disposable (UNDERPADS MEDIUM) MISC Dispense 3 boxes/mo for fecal and urinary incontinence and diarrhea. 03/19/20   [provider]  iron polysaccharides (NU-IRON) 150 MG capsule Take 1 capsule (150 mg total) by mouth daily. 03/03/20   Ngetich, Dinah C, NP  ketoconazole (NIZORAL) 2 % shampoo Apply 1 application topically See admin instructions. Use to wash hair and face, alternate by week. 03/03/20   Ngetich, Dinah C, NP  levETIRAcetam (KEPPRA) 500 MG tablet Take 1 tablet (500 mg total) by mouth 2 (two) times daily. 03/03/20   Ngetich, Dinah C, NP  magnesium hydroxide (MILK OF MAGNESIA) 400 MG/5ML suspension If no BM in 3 days, give 30 cc Milk of Magnesium p.o. x 1 dose in 24 hours as needed (Do not use standing constipation orders for residents with renal failure CFR less than 30. Contact MD for orders) (Physician Order) 02/13/20   [provider]  Misc. Devices MISC Take by mouth. 03/18/20   [provider]  Misc. Devices MISC Manual wheelchair. 16 inch wide with elevating leg rests, high reclining back and neck support and 16-inch cushion. 03/18/20   [provider]  Multiple Vitamins-Minerals (MULTIVITAMIN WITH MINERALS) tablet Take 1 tablet by mouth daily.    [provider]  NON FORMULARY Regular liberalized diet d/t Dx Malnutrition. 02/14/20    [provider]  nutrition supplement, JUVEN, (JUVEN) PACK Take 1 packet by mouth 2 (two) times daily between meals. To aid in wound healing. 02/14/20   [provider]  pravastatin (PRAVACHOL) 40 MG tablet Take 1 tablet (40 mg total) by mouth daily. 03/03/20   Ngetich, Donalee Citrininah C, NP  SANTYL ointment Apply 1 application topically daily. 04/14/20   [provider]  selenium sulfide (SELSUN) 1 % LOTN Apply 1 application topically daily. 03/03/20   Ngetich, Dinah C, NP  Sodium Phosphates (RA SALINE ENEMA RE) If not relieved by Biscodyl suppository, give disposable Saline Enema rectally X 1 dose/24 hrs as needed (Do not use constipation standing orders for residents with renal failure/CFR less than 30. Contact MD for orders)(Physician Or 02/13/20   [provider]  SULFATRIM PEDIATRIC 200-40 MG/5ML suspension SMARTSIG:20 Milliliter(s) By Mouth Twice Daily 05/02/20   [provider]  traMADol (ULTRAM) 50 MG  tablet Take by mouth. 03/06/20 06/04/20  [provider]  vitamin B-12 (CYANOCOBALAMIN) 500 MCG tablet Take 1 tablet (500 mcg total) by mouth daily. 03/03/20   Ngetich, Dinah C, NP  zinc gluconate 50 MG tablet Take 1 tablet (50 mg total) by mouth daily. 03/03/20   Ngetich, Donalee Citrin, NP      Allergies  Allergen Reactions  . Penicillins     unknown    ROS:  Out of a complete 14 system review of symptoms, the patient complains only of the following symptoms, and all other reviewed systems are negative.  Syncope/seizure Walking difficulty Weight loss  Blood pressure (!) 87/63, pulse 98.  Physical Exam  General: The patient is alert, he is nonverbal, he will moan at times, does not follow commands consistently.  Eyes: Pupils are equal, round, and reactive to light. Discs are flat bilaterally.  Neck: The neck is supple, no carotid bruits are noted.  Respiratory: The respiratory examination is clear.  Cardiovascular: The cardiovascular  examination reveals a regular rate and rhythm, no obvious murmurs or rubs are noted.  Skin: Extremities are without significant edema, with exception of some redness and swelling of the right hand.  Neurologic Exam  Mental status: The patient is alert, he is nonverbal.  Cranial nerves: Facial symmetry is present. There is good sensation of the face to pinprick and soft touch bilaterally. The strength of the facial muscles and the muscles is symmetric.  The patient is nonverbal, he will moan at times.  He will track objects horizontally, he will blink to threat bilaterally.  Motor: The motor testing is difficult, the patient is able to move both arms, and both legs, but he will not elevate the legs on either side.  There is restriction of movement across the knees, cannot fully extend the knees.  He has pain with manipulation of the right arm and hand.  Sensory: Sensory testing is notable in that he does have pain sensation on both arms and legs.  Coordination: Cerebellar testing could not be done, the patient would not follow commands.  Gait and station: Gait could not be tested, the patient is wheelchair-bound.  Reflexes: Deep tendon reflexes are symmetric, but are depressed bilaterally. Toes are downgoing bilaterally.   CT head and cervical 11/29/19:  IMPRESSION: Mild diffuse cortical atrophy. No acute intracranial abnormality seen.  Moderate multilevel degenerative disc disease. No acute abnormality seen in the cervical spine  * CT scan images were reviewed online. I agree with the written report.   CTA head and neck 11/30/19:  IMPRESSION: 1. Abnormal, rapidly tapering Right ICA distal to the bulb, and only thread-like enhancement of the visible Right ICA siphon which is likely to be occluded or functionally occluded distally.  2. Negative left carotid and bilateral vertebral arteries (the left vertebral is dominant).  3. Very severe degeneration of the craniocervical  junction and cervical spine, with suspected moderate or severe cervical spinal stenosis at multiple levels.   EEG 11/29/19:  IMPRESSION: This study is suggestive of moderate diffuse encephalopathy, nonspecific to etiology. No seizures or epileptiform discharges were seen throughout the recording.    Assessment/Plan:  1.  Down syndrome, significant cognitive impairment  2.  Probable seizure disorder on Keppra  3.  Nonambulatory state  4.  Right internal carotid artery occlusion  The patient has had a significant decline in his physical capabilities, he is no longer ambulatory.  He has significant arthritis of the knees and the legs will no longer completely  straighten.  The patient has had witnessed events of generalized jerking, we will keep him on Keppra for presumed seizures at this time.  He will come off of Plavix and start low-dose aspirin, 81 mg daily.  He will follow-up here in about 6 months, sooner if needed.  Marlan Palau MD 05/21/2020 9:19 AM  [facility] [address] [place], [place] [address]  Phone [phone] Fax [phone]

## 2020-05-21 NOTE — Patient Instructions (Signed)
Stop the plavix, start aspirin 81 mg a day.  Continue the keppra 500 mg twice a day.

## 2020-05-22 ENCOUNTER — Other Ambulatory Visit: Payer: Self-pay | Admitting: Family

## 2020-05-22 DIAGNOSIS — L219 Seborrheic dermatitis, unspecified: Secondary | ICD-10-CM

## 2020-05-29 ENCOUNTER — Encounter (HOSPITAL_BASED_OUTPATIENT_CLINIC_OR_DEPARTMENT_OTHER): Payer: Medicare Other | Attending: Internal Medicine | Admitting: Internal Medicine

## 2020-05-29 DIAGNOSIS — M8618 Other acute osteomyelitis, other site: Secondary | ICD-10-CM | POA: Diagnosis not present

## 2020-05-29 DIAGNOSIS — Q909 Down syndrome, unspecified: Secondary | ICD-10-CM | POA: Insufficient documentation

## 2020-05-29 DIAGNOSIS — L89154 Pressure ulcer of sacral region, stage 4: Secondary | ICD-10-CM | POA: Diagnosis not present

## 2020-05-29 DIAGNOSIS — M86152 Other acute osteomyelitis, left femur: Secondary | ICD-10-CM | POA: Diagnosis not present

## 2020-05-29 DIAGNOSIS — Z7902 Long term (current) use of antithrombotics/antiplatelets: Secondary | ICD-10-CM | POA: Insufficient documentation

## 2020-05-29 DIAGNOSIS — L89224 Pressure ulcer of left hip, stage 4: Secondary | ICD-10-CM | POA: Insufficient documentation

## 2020-05-30 ENCOUNTER — Other Ambulatory Visit: Payer: Self-pay | Admitting: Family

## 2020-06-01 NOTE — Progress Notes (Signed)
Tracy, Todd ([id number]) Visit Report for 05/29/2020 HPI Details Patient Name: Date of Service: Tracy Todd, Tracy Todd 05/29/2020 3:15 PM Medical Record Number: [id number] Patient Account Number: [id number] Date of Birth/Sex: Treating RN: [birth year] (54 y.o. Tracy Todd Right Primary Care Provider: Antony Haste Other Clinician: Referring Provider: Treating Provider/Extender: Murtis Sink in Treatment: 8 History of Present Illness HPI Description: Lab52 year old Down syndrome patient who unfortunately developed 2 deep tissue pressure wounds while recuperating at skilled nursing facility in May, patient was admitted to [facility] for syncopal event in April and after hospitalization was discharged to skilled nursing facility, his work-up included Doppler studies that showed right internal carotid artery stenosis for which she was placed on Plavix. After being at the skilled nursing facility patient developed 2 wounds and was admitted in June for an 8-day hospital stay for wound care. 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 He had a pelvic CT that showed ulceration extending along the right buttock into the medial margin of the gluteus maximus muscle without signs of osteomyelitis. Patient had wound care with bedside debridement and discharge to different facility at discharge. He is currently at a group home where he is getting home health to assist with wound care especially with central and daily dressing changes. And referred to the wound clinic for further care. Patient is nonverbal, and cannot follow instructions, sister and caregiver are present at bedside. According to them the wounds look "better" than before considering that he had black eschar covering the wounds and had some debridement performed that remove this layer. Patient's currently on iron supplements, Plavix, nutritional  supplements, lipid-lowering agent. 04/10/2020; this is a patient with 2 stage IV wounds in the lower sacrum and the left greater trochanter. He required debridement last week. They have been using Santyl but apparently they ran out of this and Medicaid would not cover a represcription. The patient has Down syndrome. In talking with his sister he is no longer verbal and nonambulatory this is a big deterioration over the last year. She states that she thinks this was a result of the first shot of pneumonia during a vaccine last January although I think he was having some problem before that. His caregiver is also here. Apparently eats and drinks well they crush his medications. They have an eggcrate pressure relief surface 8/27; left greater trochanter and lower sacrum both stage IV pressure ulcers in this very frail man with advanced Down syndrome likely underlying Alzheimer's disease. He apparently is eating and drinking well per his caregiver. They have some form of pressure relief bed. I put him on antibiotics last week because of erythema around the greater trochanter but I did not culture. He has a new superficial area over his left elbow 9/10; this is a patient who is very disabled. Presumably Down syndrome. It does not sound talking to his caregiver that he was ever that functional but he is currently better chair bound flexion contractures minimally verbal. 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 According to his caregiver she eats and drinks well. We have been using silver alginate to the wounds. I gave him a course of Levaquin 3 weeks ago empirically without cultures. His wound especially over the greater trochanter is worse today 9/24; 2-week follow-up. Patient's x-ray suggested acute osteomyelitis in the region of the proximal left femur and greater trochanter. They  recommended an MRI but I do not think we could get the patient through an MRI I  have ordered a CT scan with contrast. Culture grew E. coli that was resistant to trimethoprim sulfamethoxazole and quinolones. I have him on cefdinir which started on 9/13. They have 1 more tablet I am likely going to extend this Lab work I did on this man showed a white count of 3.82 with a reasonably normal differential. His C-reactive protein was elevated at 54.4 and sedimentation rate at 89. The rest the lab work was normal. Unfortunately they did not do a serum albumin I took some time today to talk to the patient's sister who is present about advanced medical directives. The patient has flexion contractures, barely verbal although he is eating eating and drinking well according to his caregiver. I asked him to think about the quality of this person day vis--vis the aggressiveness of medical care. She has a sister and the patient's father they promised to sit down and give [place] some sense of direction here. 10/9; patient went for his CT scan of the pelvis. Specifically over the area I was concerned about on the left greater trochanter there was suggestion of an abscess measuring 4.1 x 1 x 3.2. Also small areas of cortical erosion suggesting osteomyelitis. Over the sacrum and coccyx wound extending down to the cortex of the sacrococcygeal junction with a small amount of air contacting the left posterior aspect of the sacrococcygeal junction changes concerning for osteomyelitis. Culture I did of the left greater trochanter showed E. coli. This was resistant to quinolones and I believe trimethoprim sulfamethoxazole. He is allergic to penicillin I have been using cefdinir 300 twice daily for now completing 2 weeks. The patient arrives in clinic today with a wound over the left greater trochanter actually looking a lot better. I thought this might need an operative debridement but I cannot imagine a surgeon agreeing to do this in this setting with the wound is improved is much as it is today. In  particular there is no tenderness around here which is a big improvement. The large area over the sacrum/coccyx still has periwound tenderness but less erythema. There is no exposed bone at either site however the left greater trochanter is a considerable amount of undermining Electronic Signature(s) Signed: 06/01/2020 5:03:49 PM By: Baltazar Najjar MD Entered By: Baltazar Najjar on 05/29/2020 17:42:56 -------------------------------------------------------------------------------- Physical Exam Details Patient Name: Date of Service: LENO, MATHES 05/29/2020 3:15 PM Medical Record Number: [id number] Patient Account Number: [id number] Date of Birth/Sex: Treating RN: [birth year] (54 y.o. Tracy Todd Right Primary Care Provider: Antony Haste Other Clinician: Referring Provider: Treating Provider/Extender: Murtis Sink in Treatment: 8 Constitutional Sitting or standing Blood Pressure is within target range for patient.. Pulse regular and within target range for patient.Marland Kitchen Respirations regular, non-labored and within target range.. Temperature is normal and within the target range for the patient.. He actually looks quite a bit better. Cardiovascular Heart sounds are normal, he does not appear to be dehydrated. Gastrointestinal (GI) Abdomen is soft and non-distended without masses or tenderness.. No liver or spleen enlargement. Notes Wound exam. The sacral wound has less periwound erythema but still some tenderness. There is no exposed bone but this is a large wound. In particular the left greater trochanter does not probe to bone although there is considerable undermining. Not nearly the amount of erythema and tenderness. Considerably less drainage. Electronic Signature(s) Signed: 06/01/2020 5:03:49 PM By: Baltazar Najjar MD Entered By: Baltazar Najjar  on 05/29/2020  17:44:54 -------------------------------------------------------------------------------- Physician Orders Details Patient Name: Date of Service: MOODY, ROBBEN 05/29/2020 3:15 PM Medical Record Number: [id number] Patient Account Number: [id number] Date of Birth/Sex: Treating RN: 1966/07/28 (54 y.o. Tracy Todd Right Primary Care Provider: Antony Haste Other Clinician: Referring Provider: Treating Provider/Extender: Murtis Sink in Treatment: 8 Verbal / Phone Orders: No Diagnosis Coding ICD-10 Coding Code Description [phone] Other acute osteomyelitis, left femur L89.224 Pressure ulcer of left hip, stage 4 L89.154 Pressure ulcer of sacral region, stage 4 Q90.9 Down syndrome, unspecified Follow-up Appointments ppointment in 2 weeks. - Staff transfers Return A Other: - take antibiotics as prescribed Dressing Change Frequency Wound #1 Left Trochanter Change dressing every day. Wound #2 Sacrum Change dressing every day. Skin Barriers/Peri-Wound Care Wound #1 Left Trochanter Skin Prep Wound #2 Sacrum Skin Prep Wound Cleansing Wound #1 Left Trochanter Clean wound with Wound Cleanser Wound #2 Sacrum Clean wound with Wound Cleanser Primary Wound Dressing Wound #1 Left Trochanter Calcium Alginate with Silver Wound #2 Sacrum Calcium Alginate with Silver Secondary Dressing Wound #1 Left Trochanter Foam Border - or ABD pad and tape Wound #2 Sacrum Foam Border - or ABD pad and tape Off-Loading Turn and reposition every 2 hours Home Health Continue Home Health skilled nursing for wound care. - [facility] Consults Infectious Disease - Consult for Infectious Disease, stage 4 to left trochanter and sacral wound; Post CT results - (ICD10 [id number] - Pressure ulcer of left hip, stage 4) Electronic Signature(s) Signed: 05/29/2020 5:35:06 PM By: Cherylin Mylar Signed: 06/01/2020 5:03:49 PM By: Baltazar Najjar MD Entered By: Cherylin Mylar on 05/29/2020  16:48:14 Prescription 05/29/2020 -------------------------------------------------------------------------------- Starla Link MD Patient Name: Provider: Aug 01, 1966 [id number] Date of Birth: NPI#: Judie Petit [id number] Sex: DEA #: [phone] [id number] Phone #: License #: Eligha Bridegroom [facility] Patient Address: [address] [facility] [address] [place] [address] , [address] [place], [place] [address] [phone] Allergies penicillin Provider's Orders Infectious Disease - ICD10: W97.989 - Consult for Infectious Disease, stage 4 to left trochanter and sacral wound; Post CT results Hand Signature: Date(s): Electronic Signature(s) Signed: 05/29/2020 5:35:06 PM By: Cherylin Mylar Signed: 06/01/2020 5:03:49 PM By: Baltazar Najjar MD Entered By: Cherylin Mylar on 05/29/2020 16:48:14 -------------------------------------------------------------------------------- Problem List Details Patient Name: Date of Service: TYRIC, RODEHEAVER 05/29/2020 3:15 PM Medical Record Number: [id number] Patient Account Number: [id number] Date of Birth/Sex: Treating RN: 04/26/1966 (54 y.o. Tracy Todd Right Primary Care Provider: Antony Haste Other Clinician: Referring Provider: Treating Provider/Extender: Murtis Sink in Treatment: 8 Active Problems ICD-10 Encounter Code Description Active Date MDM Diagnosis [phone] Other acute osteomyelitis, left femur 05/15/2020 No Yes M86.18 Other acute osteomyelitis, other site 05/29/2020 No Yes L89.224 Pressure ulcer of left hip, stage 4 04/10/2020 No Yes L89.154 Pressure ulcer of sacral region, stage 4 04/10/2020 No Yes Q90.9 Down syndrome, unspecified 04/03/2020 No Yes Inactive Problems ICD-10 Code Description Active Date Inactive Date L03.116 Cellulitis of left lower limb 04/10/2020 04/10/2020 L89.012 Pressure ulcer of right elbow, stage 2 04/17/2020 04/17/2020 Resolved Problems Electronic  Signature(s) Signed: 06/01/2020 5:03:49 PM By: Baltazar Najjar MD Previous Signature: 05/29/2020 5:35:06 PM Version By: Cherylin Mylar Entered By: Baltazar Najjar on 05/29/2020 17:35:01 -------------------------------------------------------------------------------- Progress Note Details Patient Name: Date of Service: GARELD, OBRECHT 05/29/2020 3:15 PM Medical Record Number: [id number] Patient Account Number: [id number] Date of Birth/Sex: Treating RN: Aug 27, 1965 (53 y.o. Tracy Todd Right Primary Care Provider: Antony Haste Other Clinician: Referring Provider: Treating Provider/Extender: Cloria Spring  Weeks in Treatment: 8 Subjective History of Present Illness (HPI) Lab[age] year old Down syndrome patient who unfortunately developed 2 deep tissue pressure wounds while recuperating at skilled nursing facility in May, patient was admitted [facility] for syncopal event in April and after hospitalization was discharged to skilled nursing facility, his work-up included Doppler studies that showed right internal carotid artery stenosis for which she was placed on Plavix. After being at the skilled nursing facility patient developed 2 wounds and was admitted in June for an 8-day hospital stay for wound care. 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 He had a pelvic CT that showed ulceration extending along the right buttock into the medial margin of the gluteus maximus muscle without signs of osteomyelitis. Patient had wound care with bedside debridement and discharge to different facility at discharge. He is currently at a group home where he is getting home health to assist with wound care especially with central and daily dressing changes. And referred to the wound clinic for further care. Patient is nonverbal, and cannot follow instructions, sister and caregiver are present at bedside.  According to them the wounds look "better" than before considering that he had black eschar covering the wounds and had some debridement performed that remove this layer. Patient's currently on iron supplements, Plavix, nutritional supplements, lipid-lowering agent. 04/10/2020; this is a patient with 2 stage IV wounds in the lower sacrum and the left greater trochanter. He required debridement last week. They have been using Santyl but apparently they ran out of this and Medicaid would not cover a represcription. The patient has Down syndrome. In talking with his sister he is no longer verbal and nonambulatory this is a big deterioration over the last year. She states that she thinks this was a result of the first shot of pneumonia during a vaccine last January although I think he was having some problem before that. His caregiver is also here. Apparently eats and drinks well they crush his medications. They have an eggcrate pressure relief surface 8/27; left greater trochanter and lower sacrum both stage IV pressure ulcers in this very frail man with advanced Down syndrome likely underlying Alzheimer's disease. He apparently is eating and drinking well per his caregiver. They have some form of pressure relief bed. I put him on antibiotics last week because of erythema around the greater trochanter but I did not culture. He has a new superficial area over his left elbow 9/10; this is a patient who is very disabled. Presumably Down syndrome. It does not sound talking to his caregiver that he was ever that functional but he is currently better chair bound flexion contractures minimally verbal. 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 According to his caregiver she eats and drinks well. We have been using silver alginate to the wounds. I gave him a course of Levaquin 3 weeks ago empirically without cultures. His wound especially over the greater trochanter is  worse today 9/24; 2-week follow-up. Patient's x-ray suggested acute osteomyelitis in the region of the proximal left femur and greater trochanter. They recommended an MRI but I do not think we could get the patient through an MRI I have ordered a CT scan with contrast. Culture grew E. coli that was resistant to trimethoprim sulfamethoxazole and quinolones. I have him on cefdinir which started on 9/13. They have 1 more tablet I am  likely going to extend this Lab work I did on this man showed a white count of 3.82 with a reasonably normal differential. His C-reactive protein was elevated at 54.4 and sedimentation rate at 89. The rest the lab work was normal. Unfortunately they did not do a serum albumin I took some time today to talk to the patient's sister who is present about advanced medical directives. The patient has flexion contractures, barely verbal although he is eating eating and drinking well according to his caregiver. I asked him to think about the quality of this person day vis--vis the aggressiveness of medical care. She has a sister and the patient's father they promised to sit down and give [place] some sense of direction here. 10/9; patient went for his CT scan of the pelvis. Specifically over the area I was concerned about on the left greater trochanter there was suggestion of an abscess measuring 4.1 x 1 x 3.2. Also small areas of cortical erosion suggesting osteomyelitis. Over the sacrum and coccyx wound extending down to the cortex of the sacrococcygeal junction with a small amount of air contacting the left posterior aspect of the sacrococcygeal junction changes concerning for osteomyelitis. Culture I did of the left greater trochanter showed E. coli. This was resistant to quinolones and I believe trimethoprim sulfamethoxazole. He is allergic to penicillin I have been using cefdinir 300 twice daily for now completing 2 weeks. The patient arrives in clinic today with a wound over the  left greater trochanter actually looking a lot better. I thought this might need an operative debridement but I cannot imagine a surgeon agreeing to do this in this setting with the wound is improved is much as it is today. In particular there is no tenderness around here which is a big improvement. The large area over the sacrum/coccyx still has periwound tenderness but less erythema. There is no exposed bone at either site however the left greater trochanter is a considerable amount of undermining Objective Constitutional Sitting or standing Blood Pressure is within target range for patient.. Pulse regular and within target range for patient.Marland Kitchen Respirations regular, non-labored and within target range.. Temperature is normal and within the target range for the patient.. He actually looks quite a bit better. Vitals Time Taken: 4:09 PM, Height: 60 in, Weight: 103 lbs, BMI: 20.1, Temperature: 97.6 F, Pulse: 88 bpm, Respiratory Rate: 18 breaths/min, Blood Pressure: 106/69 mmHg. Cardiovascular Heart sounds are normal, he does not appear to be dehydrated. Gastrointestinal (GI) Abdomen is soft and non-distended without masses or tenderness.. No liver or spleen enlargement. General Notes: Wound exam. The sacral wound has less periwound erythema but still some tenderness. There is no exposed bone but this is a large wound. ooIn particular the left greater trochanter does not probe to bone although there is considerable undermining. Not nearly the amount of erythema and tenderness. Considerably less drainage. Integumentary (Hair, Skin) Wound #1 status is Open. Original cause of wound was Gradually Appeared. The wound is located on the Left Trochanter. The wound measures 3.3cm length x 1.6cm width x 1.2cm depth; 4.147cm^2 area and 4.976cm^3 volume. There is muscle, tendon, and Fat Layer (Subcutaneous Tissue) exposed. There is no tunneling or undermining noted. There is a medium amount of serosanguineous  drainage noted. The wound margin is well defined and not attached to the wound base. There is medium (34-66%) red, pink granulation within the wound bed. There is a medium (34-66%) amount of necrotic tissue within the wound bed including Eschar and Adherent  Slough. Wound #2 status is Open. Original cause of wound was Gradually Appeared. The wound is located on the Sacrum. The wound measures 5cm length x 4cm width x 1.2cm depth; 15.708cm^2 area and 18.85cm^3 volume. There is muscle, tendon, and Fat Layer (Subcutaneous Tissue) exposed. There is no tunneling or undermining noted. There is a medium amount of serosanguineous drainage noted. The wound margin is well defined and not attached to the wound base. There is large (67-100%) pink granulation within the wound bed. There is a small (1-33%) amount of necrotic tissue within the wound bed including Eschar and Adherent Slough. Assessment Active Problems ICD-10 Other acute osteomyelitis, left femur Other acute osteomyelitis, other site Pressure ulcer of left hip, stage 4 Pressure ulcer of sacral region, stage 4 Down syndrome, unspecified Plan Follow-up Appointments: Return Appointment in 2 weeks. - Staff transfers Other: - take antibiotics as prescribed Dressing Change Frequency: Wound #1 Left Trochanter: Change dressing every day. Wound #2 Sacrum: Change dressing every day. Skin Barriers/Peri-Wound Care: Wound #1 Left Trochanter: Skin Prep Wound #2 Sacrum: Skin Prep Wound Cleansing: Wound #1 Left Trochanter: Clean wound with Wound Cleanser Wound #2 Sacrum: Clean wound with Wound Cleanser Primary Wound Dressing: Wound #1 Left Trochanter: Calcium Alginate with Silver Wound #2 Sacrum: Calcium Alginate with Silver Secondary Dressing: Wound #1 Left Trochanter: Foam Border - or ABD pad and tape Wound #2 Sacrum: Foam Border - or ABD pad and tape Off-Loading: Turn and reposition every 2 hours Home Health: Continue Home Health  skilled nursing for wound care. - [facility] Consults ordered were: Infectious Disease - Consult for Infectious Disease, stage 4 to left trochanter and sacral wound; Post CT results 1. I really thought I might have to attempt to get this man hospitalized for operative debridement of the left hip, IV antibiotics because he has not been looking systemically well the last time I saw him. However he looks a lot better today more interactive and more responsive. I think he is responded well to the antibiotics at least in terms of the left greater trochanter likely soft tissues infection or cellulitis. I do not believe I have adequately covered the osteomyelitis however. The sacral wound also has some improvement but still considerably more periwound tenderness today. 2 given how improve the wound looked on the left greater trochanter I did not feel strongly about trying to get him in the hospital. I do not think any surgeon would consider debriding this nor do I think it is really necessary here. He did have an abscess on the CT scan I am hopeful I am covering this as well. 3. I will get infectious disease to look at him for the purposes of considering a prolonged course of IV antibiotics. I am likely to extend the oral cefdinir beyond next week 4. I think the antibiotics have helped him systemically. He looks better today. He apparently is eating and drinking well Electronic Signature(s) Signed: 06/01/2020 5:03:49 PM By: Baltazar Najjar MD Entered By: Baltazar Najjar on 05/29/2020 17:47:43 -------------------------------------------------------------------------------- SuperBill Details Patient Name: Date of Service: KERIN, CECCHI 05/29/2020 Medical Record Number: [id number] Patient Account Number: [id number] Date of Birth/Sex: Treating RN: [birth year] (54 y.o. Tracy Todd Right Primary Care Provider: Antony Haste Other Clinician: Referring Provider: Treating Provider/Extender: Murtis Sink in Treatment: 8 Diagnosis Coding ICD-10 Codes Code Description [phone] Other acute osteomyelitis, left femur M86.18 Other acute osteomyelitis, other site L89.224 Pressure ulcer of left hip, stage 4 L89.154 Pressure ulcer of sacral region, stage 4 Q90.9  Down syndrome, unspecified Facility Procedures CPT4 Code: [id number] Description: 99214 - WOUND CARE VISIT-LEV 4 EST PT Modifier: Quantity: 1 Physician Procedures : CPT4 Code Description Modifier [id number] 99214 - WC PHYS LEVEL 4 - EST PT ICD-10 Diagnosis Description M86.152 Other acute osteomyelitis, left femur L89.224 Pressure ulcer of left hip, stage 4 L89.154 Pressure ulcer of sacral region, stage 4 M86.18 Other  acute osteomyelitis, other site Quantity: 1 Electronic Signature(s) Signed: 06/01/2020 5:03:49 PM By: Baltazar Najjar MD Previous Signature: 05/29/2020 5:35:06 PM Version By: Cherylin Mylar Entered By: Baltazar Najjar on 05/29/2020 17:48:16

## 2020-06-02 NOTE — Progress Notes (Signed)
RONDAL, VANDEVELDE ([id number]) Visit Report for 05/29/2020 Arrival Information Details Patient Name: Date of Service: Tracy Todd, Tracy Todd 05/29/2020 3:15 PM Medical Record Number: [id number] Patient Account Number: [id number] Date of Birth/Sex: Treating RN: 1966-03-28 (54 y.o. [name] Primary Care Xela Oregel: Antony Haste Other Clinician: Referring Hilaria Titsworth: Treating Bader Stubblefield/Extender: Murtis Sink in Treatment: 8 Visit Information History Since Last Visit Added or deleted any medications: No Patient Arrived: Wheel Chair Any new allergies or adverse reactions: No Arrival Time: 16:09 Had a fall or experienced change in No Accompanied By: sister/caregiver activities of daily living that may affect Transfer Assistance: None risk of falls: Patient Identification Verified: Yes Signs or symptoms of abuse/neglect since last visito No Secondary Verification Process Completed: Yes Hospitalized since last visit: No Patient Requires Transmission-Based Precautions: No Implantable device outside of the clinic excluding No Patient Has Alerts: No cellular tissue based products placed in the center since last visit: Has Dressing in Place as Prescribed: Yes Pain Present Now: Unable to Respond Electronic Signature(s) Signed: 06/02/2020 9:19:13 AM By: Karl Ito Entered By: Karl Ito on 05/29/2020 16:09:58 -------------------------------------------------------------------------------- Clinic Level of Care Assessment Details Patient Name: Date of Service: Tracy Todd, Tracy Todd 05/29/2020 3:15 PM Medical Record Number: [id number] Patient Account Number: [id number] Date of Birth/Sex: Treating RN: September 30, 1965 (54 y.o. [name] Primary Care Karalyn Kadel: Antony Haste Other Clinician: Referring Jehan Ranganathan: Treating Perrin Eddleman/Extender: Murtis Sink in Treatment: 8 Clinic Level of Care Assessment Items TOOL 4 Quantity Score X- 1 0 Use  when only an EandM is performed on FOLLOW-UP visit ASSESSMENTS - Nursing Assessment / Reassessment []  - 0 Reassessment of Co-morbidities (includes updates in patient status) []  - 0 Reassessment of Adherence to Treatment Plan ASSESSMENTS - Wound and Skin A ssessment / Reassessment []  - 0 Simple Wound Assessment / Reassessment - one wound X- 2 5 Complex Wound Assessment / Reassessment - multiple wounds []  - 0 Dermatologic / Skin Assessment (not related to wound area) ASSESSMENTS - Focused Assessment []  - 0 Circumferential Edema Measurements - multi extremities []  - 0 Nutritional Assessment / Counseling / Intervention []  - 0 Lower Extremity Assessment (monofilament, tuning fork, pulses) []  - 0 Peripheral Arterial Disease Assessment (using hand held doppler) ASSESSMENTS - Ostomy and/or Continence Assessment and Care []  - 0 Incontinence Assessment and Management []  - 0 Ostomy Care Assessment and Management (repouching, etc.) PROCESS - Coordination of Care []  - 0 Simple Patient / Family Education for ongoing care X- 1 20 Complex (extensive) Patient / Family Education for ongoing care X- 1 10 Staff obtains , Records, T Results / Process Orders est X- 1 10 Staff telephones HHA, Nursing Homes / Clarify orders / etc []  - 0 Routine Transfer to another Facility (non-emergent condition) []  - 0 Routine Hospital Admission (non-emergent condition) []  - 0 New Admissions / / Ordering NPWT Apligraf, etc. , []  - 0 Emergency Hospital Admission (emergent condition) []  - 0 Simple Discharge Coordination X- 1 15 Complex (extensive) Discharge Coordination PROCESS - Special Needs []  - 0 Pediatric / Minor Patient Management []  - 0 Isolation Patient Management []  - 0 Hearing / Language / Visual special needs []  - 0 Assessment of Community assistance (transportation, D/C planning, etc.) []  - 0 Additional assistance / Altered mentation []  - 0 Support  Surface(s) Assessment (bed, cushion, seat, etc.) INTERVENTIONS - Wound Cleansing / Measurement []  - 0 Simple Wound Cleansing - one wound X- 2 5 Complex Wound Cleansing - multiple wounds X- 1 5 Wound Imaging (photographs -  any number of wounds) []  - 0 Wound Tracing (instead of photographs) []  - 0 Simple Wound Measurement - one wound X- 2 5 Complex Wound Measurement - multiple wounds INTERVENTIONS - Wound Dressings X - Small Wound Dressing one or multiple wounds 2 10 []  - 0 Medium Wound Dressing one or multiple wounds []  - 0 Large Wound Dressing one or multiple wounds X- 1 5 Application of Medications - topical []  - 0 Application of Medications - injection INTERVENTIONS - Miscellaneous []  - 0 External ear exam []  - 0 Specimen Collection (cultures, biopsies, blood, body fluids, etc.) []  - 0 Specimen(s) / Culture(s) sent or taken to Lab for analysis []  - 0 Patient Transfer (multiple staff / / Similar devices) []  - 0 Simple Staple / Suture removal (25 or less) []  - 0 Complex Staple / Suture removal (26 or more) []  - 0 Hypo / Hyperglycemic Management (close monitor of Blood Glucose) []  - 0 Ankle / Brachial Index (ABI) - do not check if billed separately X- 1 5 Vital Signs Has the patient been seen at the hospital within the last three years: Yes Total Score: 120 Level Of Care: New/Established - Level 4 Electronic Signature(s) Signed: 05/29/2020 5:35:06 PM By: Entered By: on 05/29/2020 16:51:37 -------------------------------------------------------------------------------- Encounter Discharge Information Details Patient Name: Date of Service: Tracy Todd, Tracy Todd 05/29/2020 3:15 PM Medical Record Number: Patient Account Number: Date of Birth/Sex: Treating RN: [birth year] (54 y.o. Primary Care Rubi Tooley: Other Clinician: Referring Kaushik Maul: Treating Latha Staunton/Extender: in Treatment: 8 Encounter Discharge Information Items Discharge Condition: Stable Ambulatory Status: Wheelchair Discharge Destination: Home Transportation: Private Auto Accompanied By: sister, caregivers Schedule Follow-up Appointment: Yes Clinical Summary of Care: Patient Declined Electronic Signature(s) Signed: 05/29/2020 5:42:53 PM By: Cherylin Mylar RN, BSN Entered By: Cherylin Mylar on 05/29/2020 17:20:37 -------------------------------------------------------------------------------- Lower Extremity Assessment Details Patient Name: Date of Service: Tracy Todd, Tracy Todd 05/29/2020 3:15 PM Medical Record Number: [id number] Patient Account Number: [id number] Date of Birth/Sex: Treating RN: 10-21-1965 (54 y.o. Damaris Schooner Primary Care Leonie Amacher: Antony Haste Other Clinician: Referring Ogden Handlin: Treating Kyandra Mcclaine/Extender: Murtis Sink in Treatment: 8 Electronic Signature(s) Signed: 05/29/2020 5:35:06 PM By: Zenaida Deed Entered By: Zenaida Deed on 05/29/2020 16:42:54 -------------------------------------------------------------------------------- Multi Wound Chart Details Patient Name: Date of Service: BREVEN, GUIDROZ 05/29/2020 3:15 PM Medical Record Number: [id number] Patient Account Number: [id number] Date of Birth/Sex: Treating RN: 02-18-1966 (54 y.o. [name] Primary Care Riyan Gavina: Antony Haste Other Clinician: Referring Thiago Ragsdale: Treating Kriss Perleberg/Extender: Murtis Sink in Treatment: 8 Vital Signs Height(in): 60 Pulse(bpm): 88 Weight(lbs): 103 Blood Pressure(mmHg): 106/69 Body Mass Index(BMI): 20 Temperature(F): 97.6 Respiratory Rate(breaths/min): 18 Photos: [1:No Photos Left Trochanter] [2:No Photos Sacrum] [N/A:N/A N/A] Wound Location: [1:Gradually Appeared] [2:Gradually Appeared] [N/A:N/A] Wounding Event: [1:Pressure Ulcer] [2:Pressure Ulcer]  [N/A:N/A] Primary Etiology: [1:12/21/2019] [2:12/21/2019] [N/A:N/A] Date Acquired: [1:8] [2:8] [N/A:N/A] Weeks of Treatment: [1:Open] [2:Open] [N/A:N/A] Wound Status: [1:3.3x1.6x1.2] [2:5x4x1.2] [N/A:N/A] Measurements L x W x D (cm) [1:4.147] [2:15.708] [N/A:N/A] A (cm) : rea [1:4.976] [2:18.85] [N/A:N/A] Volume (cm) : [1:70.70%] [2:-11.10%] [N/A:N/A] % Reduction in A rea: [1:70.70%] [2:-2.60%] [N/A:N/A] % Reduction in Volume: [1:Category/Stage IV] [2:Category/Stage IV] [N/A:N/A] Classification: [1:Medium] [2:Medium] [N/A:N/A] Exudate A mount: [1:Serosanguineous] [2:Serosanguineous] [N/A:N/A] Exudate Type: [1:red, brown] [2:red, brown] [N/A:N/A] Exudate Color: [1:Well defined, not attached] [2:Well defined, not attached] [N/A:N/A] Wound Margin: [1:Medium (34-66%)] [2:Large (67-100%)] [N/A:N/A] Granulation A mount: [1:Red, Pink] [2:Pink] [N/A:N/A] Granulation Quality: [1:Medium (34-66%)] [2:Small (1-33%)] [N/A:N/A]  Necrotic A mount: [1:Eschar, Adherent Slough] [2:Eschar, Adherent Slough] [N/A:N/A] Necrotic Tissue: [1:Fat Layer (Subcutaneous Tissue): Yes Fat Layer (Subcutaneous Tissue): Yes N/A] Exposed Structures: [1:Tendon: Yes Muscle: Yes Fascia: No Joint: No Bone: No Small (1-33%)] [2:Tendon: Yes Muscle: Yes Fascia: No Joint: No Bone: No None] [N/A:N/A] Treatment Notes Wound #1 (Left Trochanter) 2. Periwound Care Skin Prep 3. Primary Dressing Applied Calcium Alginate Ag 4. Secondary Dressing Foam Border Dressing Wound #2 (Sacrum) 2. Periwound Care Skin Prep 3. Primary Dressing Applied Calcium Alginate Ag 4. Secondary Dressing Foam Border Dressing Electronic Signature(s) Signed: 05/29/2020 5:36:25 PM By: Cherylin Mylarwiggins, Shannon Signed: 06/01/2020 5:03:49 PM By: Baltazar Najjarobson, Michael MD Entered By: Baltazar Najjarobson, Michael on 05/29/2020 17:35:07 -------------------------------------------------------------------------------- Multi-Disciplinary Care Plan Details Patient Name: Date of  Service: Tracy Todd, Tracy Todd 05/29/2020 3:15 PM Medical Record Number: [id number] Patient Account Number: [id number] Date of Birth/Sex: Treating RN: October 08, 1965 (54 y.o. Katherina RightM) Dwiggins, Shannon Primary Care Macsen Nuttall: Antony HasteBadger, Michael Other Clinician: Referring Vitaly Wanat: Treating Lasandra Batley/Extender: Murtis Sinkobson, Michael Badger, Michael Weeks in Treatment: 8 Active Inactive Pain, Acute or Chronic Nursing Diagnoses: Pain, acute or chronic: actual or potential Goals: Patient/caregiver will verbalize adequate pain control between visits Date Initiated: 04/03/2020 Target Resolution Date: 06/12/2020 Goal Status: Active Interventions: Provide education on pain management Notes: Pressure Nursing Diagnoses: Knowledge deficit related to management of pressures ulcers Goals: Patient/caregiver will verbalize risk factors for pressure ulcer development Date Initiated: 04/03/2020 Target Resolution Date: 06/12/2020 Goal Status: Active Interventions: Provide education on pressure ulcers Notes: Electronic Signature(s) Signed: 05/29/2020 5:35:06 PM By: Cherylin Mylarwiggins, Shannon Entered By: Cherylin Mylarwiggins, Shannon on 05/29/2020 16:27:00 -------------------------------------------------------------------------------- Pain Assessment Details Patient Name: Date of Service: Tracy Todd, Tracy Todd 05/29/2020 3:15 PM Medical Record Number: [id number] Patient Account Number: [id number] Date of Birth/Sex: Treating RN: October 08, 1965 (54 y.o. Katherina RightM) Dwiggins, Shannon Primary Care Loghan Kurtzman: Antony HasteBadger, Michael Other Clinician: Referring Nivea Wojdyla: Treating Idaly Verret/Extender: Murtis Sinkobson, Michael Badger, Michael Weeks in Treatment: 8 Active Problems Location of Pain Severity and Description of Pain Patient Has Paino Patient Unable to Respond Site Locations Pain Management and Medication Current Pain Management: Electronic Signature(s) Signed: 05/29/2020 5:35:06 PM By: Cherylin Mylarwiggins, Shannon Signed: 06/02/2020 9:19:13 AM By: Karl Itoawkins, Destiny Entered By:  Karl Itoawkins, Destiny on 05/29/2020 16:10:37 -------------------------------------------------------------------------------- Patient/Caregiver Education Details Patient Name: Date of Service: Tracy Todd, Tracy Todd 10/8/2021andnbsp3:15 PM Medical Record Number: [id number] Patient Account Number: [id number] Date of Birth/Gender: Treating RN: October 08, 1965 (54 y.o. Katherina RightM) Dwiggins, Shannon Primary Care Physician: Antony HasteBadger, Michael Other Clinician: Referring Physician: Treating Physician/Extender: Murtis Sinkobson, Michael Badger, Michael Weeks in Treatment: 8 Education Assessment Education Provided To: Caregiver Education Topics Provided Pain: Handouts: A Guide to Pain Control Methods: Explain/Verbal Responses: State content correctly Pressure: Handouts: Pressure Ulcers: Care and Offloading Methods: Explain/Verbal Responses: State content correctly Electronic Signature(s) Signed: 05/29/2020 5:35:06 PM By: Cherylin Mylarwiggins, Shannon Entered By: Cherylin Mylarwiggins, Shannon on 05/29/2020 16:27:21 -------------------------------------------------------------------------------- Wound Assessment Details Patient Name: Date of Service: Tracy Todd, Tracy Todd 05/29/2020 3:15 PM Medical Record Number: [id number] Patient Account Number: [id number] Date of Birth/Sex: Treating RN: October 08, 1965 (54 y.o. Katherina RightM) Dwiggins, Shannon Primary Care Chanceler Pullin: Antony HasteBadger, Michael Other Clinician: Referring Marian Meneely: Treating Keoki Mchargue/Extender: Murtis Sinkobson, Michael Badger, Michael Weeks in Treatment: 8 Wound Status Wound Number: 1 Primary Etiology: Pressure Ulcer Wound Location: Left Trochanter Wound Status: Open Wounding Event: Gradually Appeared Date Acquired: 12/21/2019 Weeks Of Treatment: 8 Clustered Wound: No Photos Photo Uploaded By: Benjaman KindlerJones, Dedrick on 06/02/2020 11:30:51 Wound Measurements Length: (cm) 3.3 Width: (cm) 1.6 Depth: (cm) 1.2 Area: (cm) 4.147 Volume: (cm) 4.976 % Reduction in Area: 70.7% % Reduction in Volume: 70.7% Epithelialization: Small  (1-33%) Tunneling: No Undermining: No Wound Description Classification:  Category/Stage IV Wound Margin: Well defined, not attached Exudate Amount: Medium Exudate Type: Serosanguineous Exudate Color: red, brown Foul Odor After Cleansing: No Slough/Fibrino Yes Wound Bed Granulation Amount: Medium (34-66%) Exposed Structure Granulation Quality: Red, Pink Fascia Exposed: No Necrotic Amount: Medium (34-66%) Fat Layer (Subcutaneous Tissue) Exposed: Yes Necrotic Quality: Eschar, Adherent Slough Tendon Exposed: Yes Muscle Exposed: Yes Necrosis of Muscle: No Joint Exposed: No Bone Exposed: No Treatment Notes Wound #1 (Left Trochanter) 2. Periwound Care Skin Prep 3. Primary Dressing Applied Calcium Alginate Ag 4. Secondary Dressing Foam Border Dressing Electronic Signature(s) Signed: 05/29/2020 5:35:06 PM By: Cherylin Mylar Signed: 06/02/2020 5:49:00 PM By: Yevonne Pax RN Entered By: Yevonne Pax on 05/29/2020 16:20:22 -------------------------------------------------------------------------------- Wound Assessment Details Patient Name: Date of Service: Tracy Todd, Tracy Todd 05/29/2020 3:15 PM Medical Record Number: [id number] Patient Account Number: [id number] Date of Birth/Sex: Treating RN: Jun 25, 1966 (54 y.o. [name] Primary Care Jouri Threat: Antony Haste Other Clinician: Referring Amyre Segundo: Treating Hashim Eichhorst/Extender: Murtis Sink in Treatment: 8 Wound Status Wound Number: 2 Primary Etiology: Pressure Ulcer Wound Location: Sacrum Wound Status: Open Wounding Event: Gradually Appeared Date Acquired: 12/21/2019 Weeks Of Treatment: 8 Clustered Wound: No Photos Photo Uploaded By: Benjaman Kindler on 06/02/2020 11:31:04 Wound Measurements Length: (cm) 5 % Re Width: (cm) 4 % Re Depth: (cm) 1.2 Epit Area: (cm) 15.708 Tun Volume: (cm) 18.85 Und duction in Area: -11.1% duction in Volume: -2.6% helialization: None neling: No ermining:  No Wound Description Classification: Category/Stage IV Fou Wound Margin: Well defined, not attached Slo Exudate Amount: Medium Exudate Type: Serosanguineous Exudate Color: red, brown l Odor After Cleansing: No ugh/Fibrino Yes Wound Bed Granulation Amount: Large (67-100%) Exposed Structure Granulation Quality: Pink Fascia Exposed: No Necrotic Amount: Small (1-33%) Fat Layer (Subcutaneous Tissue) Exposed: Yes Necrotic Quality: Eschar, Adherent Slough Tendon Exposed: Yes Muscle Exposed: Yes Necrosis of Muscle: No Joint Exposed: No Bone Exposed: No Treatment Notes Wound #2 (Sacrum) 2. Periwound Care Skin Prep 3. Primary Dressing Applied Calcium Alginate Ag 4. Secondary Dressing Foam Border Dressing Electronic Signature(s) Signed: 05/29/2020 5:35:06 PM By: Cherylin Mylar Signed: 06/02/2020 5:49:00 PM By: Yevonne Pax RN Entered By: Yevonne Pax on 05/29/2020 16:20:35 -------------------------------------------------------------------------------- Vitals Details Patient Name: Date of Service: Tracy Todd, Tracy Todd 05/29/2020 3:15 PM Medical Record Number: [id number] Patient Account Number: [id number] Date of Birth/Sex: Treating RN: 06-14-1966 (54 y.o. [name] Primary Care Eusebia Grulke: Antony Haste Other Clinician: Referring Tylicia Sherman: Treating Tationa Stech/Extender: Murtis Sink in Treatment: 8 Vital Signs Time Taken: 16:09 Temperature (F): 97.6 Height (in): 60 Pulse (bpm): 88 Weight (lbs): 103 Respiratory Rate (breaths/min): 18 Body Mass Index (BMI): 20.1 Blood Pressure (mmHg): 106/69 Reference Range: 80 - 120 mg / dl Electronic Signature(s) Signed: 06/02/2020 9:19:13 AM By: Karl Ito Entered By: Karl Ito on 05/29/2020 16:10:20

## 2020-06-03 ENCOUNTER — Ambulatory Visit: Payer: Medicare Other | Admitting: Internal Medicine

## 2020-06-10 ENCOUNTER — Ambulatory Visit (INDEPENDENT_AMBULATORY_CARE_PROVIDER_SITE_OTHER): Payer: Medicare Other | Admitting: Infectious Diseases

## 2020-06-10 ENCOUNTER — Encounter: Payer: Self-pay | Admitting: Infectious Diseases

## 2020-06-10 ENCOUNTER — Other Ambulatory Visit: Payer: Self-pay

## 2020-06-10 VITALS — BP 145/73

## 2020-06-10 DIAGNOSIS — M4628 Osteomyelitis of vertebra, sacral and sacrococcygeal region: Secondary | ICD-10-CM | POA: Diagnosis present

## 2020-06-10 DIAGNOSIS — L0231 Cutaneous abscess of buttock: Secondary | ICD-10-CM | POA: Diagnosis not present

## 2020-06-10 DIAGNOSIS — M869 Osteomyelitis, unspecified: Secondary | ICD-10-CM | POA: Diagnosis not present

## 2020-06-10 MED ORDER — METRONIDAZOLE 500 MG PO TABS: 500.0000 mg | ORAL_TABLET | Freq: Three times a day (TID) | ORAL | 0 refills | Status: DC

## 2020-06-10 NOTE — Progress Notes (Addendum)
[facility]                                                             [place], [place], [place], [address]                                                                  Phn. 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 Would start on cefazolin 2g IV q8 hrs for 6 weeks and Metronidazole 556m PO TID for initial 2 weeks. Weekly CBC, CMP, ESR and CRP PICC line to be removed after completion of IV abx.   Follow Up with Wound Care. Patient is currently staying with a care provider ( husband and wife) and have a HPocatelloto help with   Had a discussion at length with both sisters regarding chances of recurrence of decubitus ulcers given his non functional status and need to off load pressure if healing is a goal.   Follow up in 4 weeks/PRN  I spent greater than 45 minutes with the patient including  review of prior medical records with greater than 50% of time in face to face counsel of the patient and coordination of care.  SRosiland Oz MD [facility]  Office phone [phone] no. [phone]______________________________________________________________________________________________________________________  HPI: 54year old male with Down syndrome patient who is referred to [facility] for evaluation and management of OM of left greater trochanter and sacral OM.  Brief summary  Patient was admitted to the hospital  in April 2021 for syncope. Work up included a CT angiogram of the neck with results concerning for right ICA occlusion distal to the bulb.  Vascular surgery as well as neurology were consulted. 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  Neurology recommends aspirin and Plavix for 3 months followed by aspirin alone.  He underwent an EEG that was negative for seizures.  Neurology and vascular surgery cleared patient for discharge as he was thought to be back to baseline. He was discharged to a SNF where he  developed 2 deep tissue pressure wounds sometime in May. He was re-admitted in June where he was noted to have a stage IV decubitus in the  sacrum and left lateral hip pressure ulcer unstageable, at that time. Pelvic CT  showed ulceration extending along the right buttock into the medial margin of the gluteus maximus muscle without signs of osteomyelitis. Patient had wound care with bedside debridement and discharge to different facility at discharge at the end of June.    He is being followed by wound care Dr Dellia Nims as well on a regular basis. The wound was debrided sometime in August and was also started on Levaquin on 8/20 empirically without cultures. x-ray suggested acute osteomyelitis in the region of the proximal left femur and greater trochanter. Wound Culture 9/10  grew E. coli that was resistant to trimethoprim -sulfamethoxazole and quinolones. He was started on cefdinir on  9/13 for 2 weeks give CT pelvis on 9/30 with findings s/o OM of the sacrococcygeal region and left greater trochanter.   Patient is accompanied by his 2 sisters and his caregiver Earlie Server. Sisters say that patient was ambulatory and functional until Jan/feb 2021 and he became bed bound and non functional after receiving the COVID vaccine sometime in January.   Sisters do not have good details about the penicillin  allergy. They know that he had s severe allergy to penicillin as a child, unsure if he had any lip/tongue or facial swelling/itching and rashes. He worse a bracelet of penicillin allergy as a child.   ROS - limited due to patients clinical condition   Past Medical History:  Diagnosis Date  . Arthritis   . Down's syndrome   . Headache   . Hyperlipidemia   . Iron deficiency anemia   . Recurrent syncope   . Seizure disorder ([place]) 05/21/2020  . Seizures ([place])    Current Outpatient Medications on File Prior to Visit  Medication Sig Dispense Refill  . acetaminophen (TYLENOL) 500 MG tablet Take by mouth.    . Amino Acids-Protein Hydrolys (FEEDING SUPPLEMENT, PRO-STAT SUGAR FREE 64,) LIQD Take 30 mLs by mouth 2 (two) times daily. To aid in wound healing; resident prefers Prostat mixed in water.    Marland Kitchen aspirin 81 MG EC tablet Take 1 tablet (81 mg total) by mouth daily. 30 tablet 1  . benzocaine (AMERICAINE) 20 % oral spray Apply topically.     . bisacodyl (DULCOLAX) 10 MG suppository If not relieved by MOM, give 10 mg Bisacodyl suppositiory rectally X 1 dose in 24 hours as needed (Do not use constipation standing orders for residents with renal failure/CFR less than 30. Contact MD for orders) (Physician Order)     . cefdinir (OMNICEF) 300 MG capsule Take 300 mg by mouth 2 (two) times daily.    . cetirizine (ZYRTEC) 10 MG tablet Take 10 mg by mouth daily.    . diclofenac Sodium (VOLTAREN) 1 % GEL Apply 2 g topically 4 (four) times daily. To both knees 50 g 0  . Disposable Gloves MISC Use gloves for diaper changes.Dispense 2 boxes    . Ensure Plus (ENSURE PLUS) LIQD Take 237 mLs by mouth 2 (two) times daily between meals.     Marland Kitchen FIBER ADULT GUMMIES PO Take 1 tablet by mouth in the morning and at bedtime.     . finasteride (PROSCAR) 5 MG tablet Take 5 mg by mouth daily.    . hydrocortisone 2.5 % cream Apply to affected area 2 times daily    . Incontinence Supply Disposable (COMFORT SHIELD ADULT  DIAPERS) MISC Dispense 5 boxes/mo. Use 4-6 times daily for fecal incontinence and diarrhea.    Marland Kitchen  Incontinence Supply Disposable (RA ADULT WIPES) MISC Please dispense 4 boxes/wipes per month.    . Incontinence Supply Disposable (UNDERPADS MEDIUM) MISC Dispense 3 boxes/mo for fecal and urinary incontinence and diarrhea.    . iron polysaccharides (NU-IRON) 150 MG capsule Take 1 capsule (150 mg total) by mouth daily. 30 capsule 0  . ketoconazole (NIZORAL) 2 % shampoo Apply 1 application topically See admin instructions. Use to wash hair and face, alternate by week. 120 mL 0  . levETIRAcetam (KEPPRA) 500 MG tablet Take 1 tablet (500 mg total) by mouth 2 (two) times daily. 60 tablet 0  . magnesium hydroxide (MILK OF MAGNESIA) 400 MG/5ML suspension If no BM in 3 days, give 30 cc Milk of Magnesium p.o. x 1 dose in 24 hours as needed (Do not use standing constipation orders for residents with renal failure CFR less than 30. Contact MD for orders) (Physician Order)     . Misc. Devices MISC Take by mouth.     . Misc. Devices MISC Manual wheelchair. 16 inch wide with elevating leg rests, high reclining back and neck support and 16-inch cushion.    . Multiple Vitamins-Minerals (MULTIVITAMIN WITH MINERALS) tablet Take 1 tablet by mouth daily.    . NON FORMULARY Regular liberalized diet d/t Dx Malnutrition.     . nutrition supplement, JUVEN, (JUVEN) PACK Take 1 packet by mouth 2 (two) times daily between meals. To aid in wound healing.    . pravastatin (PRAVACHOL) 40 MG tablet Take 1 tablet (40 mg total) by mouth daily. 30 tablet 0  . SANTYL ointment Apply 1 application topically daily.     Marland Kitchen selenium sulfide (SELSUN) 1 % LOTN Apply 1 application topically daily. 207 mL 0  . Sodium Phosphates (RA SALINE ENEMA RE) If not relieved by Biscodyl suppository, give disposable Saline Enema rectally X 1 dose/24 hrs as needed (Do not use constipation standing orders for residents with renal failure/CFR less than 30. Contact  MD for orders)(Physician Or     . UNABLE TO FIND Med Name: anti dandruff shampoo    . vitamin B-12 (CYANOCOBALAMIN) 500 MCG tablet Take 1 tablet (500 mcg total) by mouth daily. 30 tablet 0  . zinc gluconate 50 MG tablet Take 1 tablet (50 mg total) by mouth daily. 30 tablet 0   No current facility-administered medications on file prior to visit.   Allergies  Allergen Reactions  . Penicillins     unknown   Social History   Socioeconomic History  . Marital status: Single    Spouse name: Not on file  . Number of children: 0  . Years of education: Not on file  . Highest education level: Not on file  Occupational History  . Not on file  Tobacco Use  . Smoking status: Never Smoker  . Smokeless tobacco: Never Used  Vaping Use  . Vaping Use: Never used  Substance and Sexual Activity  . Alcohol use: Never  . Drug use: Never  . Sexual activity: Not on file  Other Topics Concern  . Not on file  Social History Narrative   05/21/20 lives in caregiver's home   Social Determinants of Health   Financial Resource Strain:   . Difficulty of Paying Living Expenses: Not on file  Food Insecurity:   . Worried About Charity fundraiser in the Last Year: Not on file  . Ran Out of Food in the Last Year: Not on file  Transportation Needs:   . Lack of Transportation (Medical):  Not on file  . Lack of Transportation (Non-Medical): Not on file  Physical Activity:   . Days of Exercise per Week: Not on file  . Minutes of Exercise per Session: Not on file  Stress:   . Feeling of Stress : Not on file  Social Connections:   . Frequency of Communication with Friends and Family: Not on file  . Frequency of Social Gatherings with Friends and Family: Not on file  . Attends Religious Services: Not on file  . Active Member of Clubs or Organizations: Not on file  . Attends Archivist Meetings: Not on file  . Marital Status: Not on file  Intimate Partner Violence:   . Fear of Current or  Ex-Partner: Not on file  . Emotionally Abused: Not on file  . Physically Abused: Not on file  . Sexually Abused: Not on file   No family history on file.  Vitals BP (!) 145/73   Examination  General - not in acute distress, comfortably sitting in WHEELCHAIR HEENT - PEERLA, PALE  and no icterus Chest - b/l clear air entry, no additional sounds CVS- Normal s1s2, RRR Abdomen - Soft, Non tender , non distended Ext- no pedal edema Neuro: grossly normal Back       Psych : calm and cooperative   Recent labs CBC Latest Ref Rng & Units 02/18/2020 02/13/2020 02/07/2020  WBC - 8.0 7.3 4.9  Hemoglobin 13.5 - 17.5 11.0(A) 11.6(L) 11.2(L)  Hematocrit 41 - 53 35(A) 38.1(L) 37.5(L)  Platelets 150 - 400 K/uL - 388 345   CMP Latest Ref Rng & Units 02/18/2020 02/13/2020 02/12/2020  Glucose 70 - 99 mg/dL - 103(H) -  BUN 4 - 21 26(A) 30(H) -  Creatinine 0.6 - 1.3 0.7 0.75 0.87  Sodium 137 - 147 140 139 -  Potassium 3.4 - 5.3 3.8 4.1 -  Chloride 99 - 108 102 101 -  CO2 13 - 22 24(A) 29 -  Calcium 8.7 - 10.7 8.6(A) 8.7(L) -  Total Protein 6.5 - 8.1 g/dL - - -  Total Bilirubin 0.3 - 1.2 mg/dL - - -  Alkaline Phos 38 - 126 U/L - - -  AST 15 - 41 U/L - - -  ALT 0 - 44 U/L - - -     Pertinent Microbiology Results for orders placed or performed during the hospital encounter of 05/01/20  Aerobic Culture (superficial specimen)     Status: None   Collection Time: 05/01/20  1:00 PM   Specimen: Wound  Result Value Ref Range Status   Specimen Description   Final    WOUND Performed at [facility] [address], [place], [place] [address]    Special Requests   Final    NONE Performed at [facility], [place] [address], [place], [place] [address]    Gram Stain   Final    FEW WBC PRESENT, PREDOMINANTLY PMN ABUNDANT GRAM POSITIVE COCCI MODERATE GRAM NEGATIVE RODS MODERATE GRAM POSITIVE RODS Performed at [facility], [place] [address],  [place], [place] [address]    Culture MODERATE ESCHERICHIA COLI  Final   Report Status 05/04/2020 FINAL  Final   Organism ID, Bacteria ESCHERICHIA COLI  Final      Susceptibility   Escherichia coli - MIC*    AMPICILLIN <=2 SENSITIVE Sensitive     CEFAZOLIN <=4 SENSITIVE Sensitive     CEFEPIME <=0.12 SENSITIVE Sensitive     CEFTAZIDIME <=1 SENSITIVE Sensitive  CEFTRIAXONE <=0.25 SENSITIVE Sensitive     CIPROFLOXACIN >=4 RESISTANT Resistant     GENTAMICIN <=1 SENSITIVE Sensitive     IMIPENEM <=0.25 SENSITIVE Sensitive     TRIMETH/SULFA >=320 RESISTANT Resistant     AMPICILLIN/SULBACTAM <=2 SENSITIVE Sensitive     PIP/TAZO <=4 SENSITIVE Sensitive     * MODERATE ESCHERICHIA COLI      Pertinent Imaging CT pelvis W contrast 05/21/20 FINDINGS: Bones/Joint/Cartilage  Soft tissue ulcer along the right side of the sacrum and coccyx. Wound extending down to the cortex of the sacrococcygeal junction posteriorly with small amount air contacting the left posterior aspect of the sacrococcygeal junction an erosion of the Cx4 spinous process concerning for osteomyelitis.  Soft tissue ulcer along the left greater trochanter. Fluid collection overlying the greater troch anterior measuring 4.1 x 1 x 3.2 cm with bubbles of air within the fluid most consistent with an abscess. Small area of cortical erosion along the periphery of the greater trochanter (image 91/series 2) most concerning for osteomyelitis.  No hip fracture or dislocation. Subacute L4 vertebral body compression fracture similar in appearance to the prior examination. Normal alignment. No joint effusion. 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 Severe bilateral foraminal stenosis at L5-S1. Severe degenerative disease with disc height loss at L5-S1.  Ligaments  Ligaments are suboptimally evaluated by CT.  Muscles and Tendons Muscles are normal.  No muscle  atrophy.  Soft tissue No fluid collection or hematoma.  No soft tissue mass.  IMPRESSION: 1. Soft tissue ulcer along the right side of the sacrum and coccyx. Wound extending down to the cortex of the sacrococcygeal junction posteriorly with small amount air contacting the left posterior aspect of the sacrococcygeal junction Cx4 spinous process concerning for osteomyelitis. 2. Soft tissue ulcer along the left greater trochanter. Fluid collection overlying the greater troch anterior measuring 4.1 x 1 x 3.2 cm with bubbles of air within the fluid most consistent with an abscess. Small area of cortical erosion along the periphery of the greater trochanter (image 91/series 2) most concerning for osteomyelitis. 3. Subacute L4 vertebral body compression fracture similar in appearance to the prior examination. 4. 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 Severe bilateral foraminal stenosis at L5-S1.   Xray Unilateral Hip WWO pelvis 05/01/20   All pertinent labs/Imagings/notes reviewed. All pertinent plain films and CT images have been personally visualized and interpreted; radiology reports have been reviewed. Decision making incorporated into the Impression / Recommendations.

## 2020-06-11 ENCOUNTER — Telehealth: Payer: Self-pay | Admitting: *Deleted

## 2020-06-11 ENCOUNTER — Encounter (HOSPITAL_BASED_OUTPATIENT_CLINIC_OR_DEPARTMENT_OTHER): Payer: Medicare Other | Admitting: Internal Medicine

## 2020-06-11 ENCOUNTER — Telehealth: Payer: Self-pay

## 2020-06-11 DIAGNOSIS — M86152 Other acute osteomyelitis, left femur: Secondary | ICD-10-CM | POA: Diagnosis not present

## 2020-06-11 LAB — COMPREHENSIVE METABOLIC PANEL
AG Ratio: 0.9 (calc) — ABNORMAL LOW (ref 1.0–2.5)
ALT: 11 U/L (ref 9–46)
AST: 20 U/L (ref 10–35)
Albumin: 3 g/dL — ABNORMAL LOW (ref 3.6–5.1)
Alkaline phosphatase (APISO): 82 U/L (ref 35–144)
BUN/Creatinine Ratio: 16 (calc) (ref 6–22)
BUN: 11 mg/dL (ref 7–25)
CO2: 26 mmol/L (ref 20–32)
Calcium: 8.7 mg/dL (ref 8.6–10.3)
Chloride: 104 mmol/L (ref 98–110)
Creat: 0.67 mg/dL — ABNORMAL LOW (ref 0.70–1.33)
Globulin: 3.4 g/dL (calc) (ref 1.9–3.7)
Glucose, Bld: 108 mg/dL — ABNORMAL HIGH (ref 65–99)
Potassium: 4.4 mmol/L (ref 3.5–5.3)
Sodium: 140 mmol/L (ref 135–146)
Total Bilirubin: 0.4 mg/dL (ref 0.2–1.2)
Total Protein: 6.4 g/dL (ref 6.1–8.1)

## 2020-06-11 LAB — CBC
HCT: 41 % (ref 38.5–50.0)
Hemoglobin: 13 g/dL — ABNORMAL LOW (ref 13.2–17.1)
MCH: 29.9 pg (ref 27.0–33.0)
MCHC: 31.7 g/dL — ABNORMAL LOW (ref 32.0–36.0)
MCV: 94.3 fL (ref 80.0–100.0)
MPV: 10 fL (ref 7.5–12.5)
Platelets: 455 10*3/uL — ABNORMAL HIGH (ref 140–400)
RBC: 4.35 10*6/uL (ref 4.20–5.80)
RDW: 15 % (ref 11.0–15.0)
WBC: 6.4 10*3/uL (ref 3.8–10.8)

## 2020-06-11 LAB — SEDIMENTATION RATE: Sed Rate: 53 mm/h — ABNORMAL HIGH (ref 0–20)

## 2020-06-11 LAB — C-REACTIVE PROTEIN: CRP: 30.3 mg/L — ABNORMAL HIGH (ref <8.0)

## 2020-06-11 NOTE — Progress Notes (Signed)
Tracy Todd, Tracy Todd ([id number]) Visit Report for 06/11/2020 Arrival Information Details Patient Name: Date of Service: Tracy Todd, Tracy Todd 06/11/2020 1:45 PM Medical Record Number: [id number] Patient Account Number: [id number] Date of Birth/Sex: Treating RN: 01/30/1966 (54 y.o. Tracy Todd) Tracy Todd Primary Care Tracy Todd: Tracy Todd Other Clinician: Referring Tracy Todd: Treating Tracy Todd: Tracy Todd Tracy Todd: 9 Visit Information History Since Last Visit Added or deleted any medications: No Patient Arrived: Wheel Chair Any new allergies or adverse reactions: No Arrival Time: 14:04 Had a fall or experienced change in No Accompanied By: sister/caregiver activities of daily living that may affect Transfer Assistance: None risk of falls: Patient Identification Verified: Yes Signs or symptoms of abuse/neglect since last visito No Secondary Verification Process Completed: Yes Hospitalized since last visit: No Patient Requires Transmission-Based Precautions: No Implantable device outside of the clinic excluding No Patient Has Alerts: No cellular tissue based products placed in the center since last visit: Has Dressing in Place as Prescribed: Yes Pain Present Now: Unable to Respond Electronic Signature(s) Signed: 06/11/2020 2:18:08 PM By: Tracy Todd Entered By: Tracy Todd on 06/11/2020 14:04:57 -------------------------------------------------------------------------------- Clinic Level of Care Assessment Details Patient Name: Date of Service: Tracy Todd, Tracy Todd 06/11/2020 1:45 PM Medical Record Number: [id number] Patient Account Number: [id number] Date of Birth/Sex: Treating RN: 01/30/1966 (54 y.o. Tracy Todd) Tracy Todd Primary Care Tracy Todd: Tracy Todd Other Clinician: Referring Tracy Todd: Treating Tracy Todd: Tracy Todd Tracy Todd: 9 Clinic Level of Care Assessment Items TOOL 4 Quantity Score X- 1 0 Use when  only an EandM is performed on FOLLOW-UP visit ASSESSMENTS - Nursing Assessment / Reassessment X- 1 10 Reassessment of Co-morbidities (includes updates in patient status) X- 1 5 Reassessment of Adherence to Todd Plan ASSESSMENTS - Wound and Skin A ssessment / Reassessment []  - 0 Simple Wound Assessment / Reassessment - one wound X- 2 5 Complex Wound Assessment / Reassessment - multiple wounds X- 1 10 Dermatologic / Skin Assessment (not related to wound area) ASSESSMENTS - Focused Assessment []  - 0 Circumferential Edema Measurements - multi extremities X- 1 10 Nutritional Assessment / Counseling / Intervention []  - 0 Lower Extremity Assessment (monofilament, tuning fork, pulses) []  - 0 Peripheral Arterial Disease Assessment (using hand held doppler) ASSESSMENTS - Ostomy and/or Continence Assessment and Care []  - 0 Incontinence Assessment and Management []  - 0 Ostomy Care Assessment and Management (repouching, etc.) PROCESS - Coordination of Care []  - 0 Simple Patient / Family Education for ongoing care X- 1 20 Complex (extensive) Patient / Family Education for ongoing care X- 1 10 Staff obtains ChiropractorConsents, Records, T Results / Process Orders est X- 1 10 Staff telephones HHA, Nursing Homes / Clarify orders / etc []  - 0 Routine Transfer to another Facility (non-emergent condition) []  - 0 Routine Hospital Admission (non-emergent condition) []  - 0 New Admissions / Manufacturing engineernsurance Authorizations / Ordering NPWT Apligraf, etc. , []  - 0 Emergency Hospital Admission (emergent condition) []  - 0 Simple Discharge Coordination X- 1 15 Complex (extensive) Discharge Coordination PROCESS - Special Needs []  - 0 Pediatric / Minor Patient Management []  - 0 Isolation Patient Management []  - 0 Hearing / Language / Visual special needs []  - 0 Assessment of Community assistance (transportation, D/C planning, etc.) []  - 0 Additional assistance / Altered mentation []  - 0 Support  Surface(s) Assessment (bed, cushion, seat, etc.) INTERVENTIONS - Wound Cleansing / Measurement []  - 0 Simple Wound Cleansing - one wound X- 2 5 Complex Wound Cleansing - multiple wounds X- 1 5 Wound Imaging (photographs -  any number of wounds) []  - 0 Wound Tracing (instead of photographs) []  - 0 Simple Wound Measurement - one wound X- 2 5 Complex Wound Measurement - multiple wounds INTERVENTIONS - Wound Dressings X - Small Wound Dressing one or multiple wounds 1 10 X- 1 15 Medium Wound Dressing one or multiple wounds []  - 0 Large Wound Dressing one or multiple wounds []  - 0 Application of Medications - topical []  - 0 Application of Medications - injection INTERVENTIONS - Miscellaneous []  - 0 External ear exam []  - 0 Specimen Collection (cultures, biopsies, blood, body fluids, etc.) []  - 0 Specimen(s) / Culture(s) sent or taken to Lab for analysis []  - 0 Patient Transfer (multiple staff / / Similar devices) []  - 0 Simple Staple / Suture removal (25 or less) []  - 0 Complex Staple / Suture removal (26 or more) []  - 0 Hypo / Hyperglycemic Management (close monitor of Blood Glucose) []  - 0 Ankle / Brachial Index (ABI) - do not check if billed separately X- 1 5 Vital Signs Has the patient been seen at the hospital within the last three years: Yes Total Score: 155 Level Of Care: New/Established - Level 4 Electronic Signature(s) Signed: 06/11/2020 5:47:32 PM By: Entered By: on 06/11/2020 14:43:17 -------------------------------------------------------------------------------- Encounter Discharge Information Details Patient Name: Date of Service: Tracy Todd, Tracy Todd 06/11/2020 1:45 PM Medical Record Number: Patient Account Number: Date of Birth/Sex: Treating RN: 10/22/1965 (54 y.o. Primary Care Analyah Mcconnon: Other Clinician: Referring Virlee Stroschein: Treating Loreena Valeri/Extender: in Todd: 9 Encounter Discharge Information Items Discharge Condition: Stable Ambulatory Status: Wheelchair Discharge Destination: Home Transportation: Private Auto Accompanied By: sister, caregiver Schedule Follow-up Appointment: Yes Clinical Summary of Care: Patient Declined Electronic Signature(s) Signed: 06/11/2020 5:49:14 PM By: Shawn Stall RN, BSN Entered By: Shawn Stall on 06/11/2020 15:06:25 -------------------------------------------------------------------------------- Multi Wound Chart Details Patient Name: Date of Service: Tracy Todd, Tracy Todd 06/11/2020 1:45 PM Medical Record Number: [id number] Patient Account Number: [id number] Date of Birth/Sex: Treating RN: Oct 19, 1965 (54 y.o. Damaris Schooner, Tracy Todd Primary Care Calianne Larue: Tracy Sink Other Clinician: Referring Rhonna Holster: Treating Paw Karstens/Extender: 06/13/2020 in Todd: 9 Vital Signs Height(in): 60 Pulse(bpm): 72 Weight(lbs): 103 Blood Pressure(mmHg): 118/68 Body Mass Index(BMI): 20 Temperature(F): 98.1 Respiratory Rate(breaths/min): 18 Photos: [1:No Photos Left Trochanter] [2:No Photos Sacrum] [N/A:N/A N/A] Wound Location: [1:Gradually Appeared] [2:Gradually Appeared] [N/A:N/A] Wounding Event: [1:Pressure Ulcer] [2:Pressure Ulcer] [N/A:N/A] Primary Etiology: [1:12/21/2019] [2:12/21/2019] [N/A:N/A] Date Acquired: [1:9] [2:9] [N/A:N/A] Weeks of Todd: [1:Open] [2:Open] [N/A:N/A] Wound Status: [1:3x1.5x0.4] [2:6x3.5x1] [N/A:N/A] Measurements L x W x D (cm) [1:3.534] [2:16.493] [N/A:N/A] A (cm) : rea [1:1.414] [2:16.493] [N/A:N/A] Volume (cm) : [1:75.00%] [2:-16.70%] [N/A:N/A] % Reduction in A rea: [1:91.70%] [2:10.30%] [N/A:N/A] % Reduction in Volume: [1:6] Position 1 (o'clock): [1:2.3] Maximum Distance 1 (cm): [2:10] Starting Position 1 (o'clock): [2:2] Ending Position 1 (o'clock): [2:2.8] Maximum Distance 1 (cm): [1:Yes] [2:N/A]  [N/A:N/A] Tunneling: [1:No] [2:Yes] [N/A:N/A] Undermining: [1:Category/Stage IV] [2:Category/Stage IV] [N/A:N/A] Classification: [1:Medium] [2:Medium] [N/A:N/A] Exudate A mount: [1:Serosanguineous] [2:Serosanguineous] [N/A:N/A] Exudate Type: [1:red, brown] [2:red, brown] [N/A:N/A] Exudate Color: [1:Well defined, not attached] [2:Well defined, not attached] [N/A:N/A] Wound Margin: [1:Medium (34-66%)] [2:Medium (34-66%)] [N/A:N/A] Granulation A mount: [1:Red, Pink] [2:Pink] [N/A:N/A] Granulation Quality: [1:Medium (34-66%)] [2:Medium (34-66%)] [N/A:N/A] Necrotic A mount: [1:Fat Layer (Subcutaneous Tissue): Yes Fat Layer (Subcutaneous Tissue): Yes N/A] Exposed Structures: [1:Tendon: Yes Muscle: Yes Fascia: No Joint: No Bone: No Small (1-33%)] [2:Muscle: Yes Fascia: No Tendon: No Joint: No Bone:  No None] [N/A:N/A] Todd Notes Wound #1 (Left Trochanter) 2. Periwound Care Skin Prep 3. Primary Dressing Applied Calcium Alginate Ag 4. Secondary Dressing Foam Border Dressing Wound #2 (Sacrum) 3. Primary Dressing Applied Calcium Alginate Ag 4. Secondary Dressing ABD Pad 5. Secured With Secretary/administrator) Signed: 06/11/2020 5:31:58 PM By: Baltazar Najjar MD Signed: 06/11/2020 5:47:32 PM By: Shawn Stall Entered By: Baltazar Najjar on 06/11/2020 15:12:18 -------------------------------------------------------------------------------- Multi-Disciplinary Care Plan Details Patient Name: Date of Service: Tracy Todd, Tracy Todd 06/11/2020 1:45 PM Medical Record Number: [id number] Patient Account Number: [id number] Date of Birth/Sex: Treating RN: 1966-02-11 (54 y.o. Tracy Sours Primary Care Kynzi Levay: Tracy Todd Other Clinician: Referring Dustyn Dansereau: Treating Gurneet Matarese/Extender: Tracy Sink in Todd: 9 Active Inactive Pain, Acute or Chronic Nursing Diagnoses: Pain, acute or chronic: actual or potential Goals: Patient/caregiver will verbalize  adequate pain control between visits Date Initiated: 04/03/2020 Target Resolution Date: 07/10/2020 Goal Status: Active Interventions: Provide education on pain management Notes: Pressure Nursing Diagnoses: Knowledge deficit related to management of pressures ulcers Goals: Patient/caregiver will verbalize risk factors for pressure ulcer development Date Initiated: 04/03/2020 Target Resolution Date: 07/03/2020 Goal Status: Active Interventions: Provide education on pressure ulcers Notes: Electronic Signature(s) Signed: 06/11/2020 5:47:32 PM By: Shawn Stall Entered By: Shawn Stall on 06/11/2020 14:13:58 -------------------------------------------------------------------------------- Pain Assessment Details Patient Name: Date of Service: Tracy Todd, Tracy Todd 06/11/2020 1:45 PM Medical Record Number: [id number] Patient Account Number: [id number] Date of Birth/Sex: Treating RN: September 24, 1965 (54 y.o. Tracy Sours Primary Care Shiloh Swopes: Tracy Todd Other Clinician: Referring Sheranda Seabrooks: Treating Muadh Creasy/Extender: Tracy Sink in Todd: 9 Active Problems Location of Pain Severity and Description of Pain Patient Has Paino Patient Unable to Respond Site Locations Pain Management and Medication Current Pain Management: Electronic Signature(s) Signed: 06/11/2020 2:18:08 PM By: Tracy Ito Signed: 06/11/2020 5:47:32 PM By: Shawn Stall Entered By: Tracy Ito on 06/11/2020 14:05:41 -------------------------------------------------------------------------------- Patient/Caregiver Education Details Patient Name: Date of Service: KOVEN, BELINSKY 10/21/2021andnbsp1:45 PM Medical Record Number: [id number] Patient Account Number: [id number] Date of Birth/Gender: Treating RN: Mar 20, 1966 (54 y.o. Tracy Sours Primary Care Physician: Tracy Todd Other Clinician: Referring Physician: Treating Physician/Extender: Tracy Sink in Todd: 9 Education Assessment Education Provided To: Patient Education Topics Provided Pressure: Handouts: Preventing Pressure Ulcers Methods: Explain/Verbal Responses: Reinforcements needed Electronic Signature(s) Signed: 06/11/2020 5:47:32 PM By: Shawn Stall Entered By: Shawn Stall on 06/11/2020 14:14:10 -------------------------------------------------------------------------------- Wound Assessment Details Patient Name: Date of Service: Tracy Todd, Tracy Todd 06/11/2020 1:45 PM Medical Record Number: [id number] Patient Account Number: [id number] Date of Birth/Sex: Treating RN: 24-Aug-1965 (54 y.o. Tracy Sours Primary Care Yaneisy Wenz: Tracy Todd Other Clinician: Referring Sakoya Win: Treating Ayren Zumbro/Extender: Tracy Sink in Todd: 9 Wound Status Wound Number: 1 Primary Etiology: Pressure Ulcer Wound Location: Left Trochanter Wound Status: Open Wounding Event: Gradually Appeared Date Acquired: 12/21/2019 Weeks Of Todd: 9 Clustered Wound: No Wound Measurements Length: (cm) 3 Width: (cm) 1.5 Depth: (cm) 0.4 Area: (cm) 3.534 Volume: (cm) 1.414 % Reduction in Area: 75% % Reduction in Volume: 91.7% Epithelialization: Small (1-33%) Tunneling: Yes Position (o'clock): 6 Maximum Distance: (cm) 2.3 Undermining: No Wound Description Classification: Category/Stage IV Wound Margin: Well defined, not attached Exudate Amount: Medium Exudate Type: Serosanguineous Exudate Color: red, brown Foul Odor After Cleansing: No Slough/Fibrino Yes Wound Bed Granulation Amount: Medium (34-66%) Exposed Structure Granulation Quality: Red, Pink Fascia Exposed: No Necrotic Amount: Medium (34-66%) Fat Layer (Subcutaneous Tissue) Exposed: Yes Necrotic Quality: Adherent Slough Tendon Exposed: Yes Muscle Exposed: Yes Necrosis of Muscle: No Joint Exposed: No  Bone Exposed: No Todd Notes Wound #1 (Left Trochanter) 2.  Periwound Care Skin Prep 3. Primary Dressing Applied Calcium Alginate Ag 4. Secondary Dressing Foam Border Dressing Electronic Signature(s) Signed: 06/11/2020 5:47:32 PM By: Shawn Stall Signed: 06/11/2020 5:49:14 PM By: Zenaida Deed RN, BSN Previous Signature: 06/11/2020 2:18:08 PM Version By: Tracy Ito Entered By: Zenaida Deed on 06/11/2020 14:22:29 -------------------------------------------------------------------------------- Wound Assessment Details Patient Name: Date of Service: Tracy Todd, Tracy Todd 06/11/2020 1:45 PM Medical Record Number: [id number] Patient Account Number: [id number] Date of Birth/Sex: Treating RN: 06-20-1966 (54 y.o. Tracy Sours Primary Care Hermione Havlicek: Tracy Todd Other Clinician: Referring Mandi Mattioli: Treating Monay Houlton/Extender: Tracy Sink in Todd: 9 Wound Status Wound Number: 2 Primary Etiology: Pressure Ulcer Wound Location: Sacrum Wound Status: Open Wounding Event: Gradually Appeared Date Acquired: 12/21/2019 Weeks Of Todd: 9 Clustered Wound: No Wound Measurements Length: (cm) 6 Width: (cm) 3.5 Depth: (cm) 1 Area: (cm) 16.493 Volume: (cm) 16.493 % Reduction in Area: -16.7% % Reduction in Volume: 10.3% Epithelialization: None Undermining: Yes Starting Position (o'clock): 10 Ending Position (o'clock): 2 Maximum Distance: (cm) 2.8 Wound Description Classification: Category/Stage IV Wound Margin: Well defined, not attached Exudate Amount: Medium Exudate Type: Serosanguineous Exudate Color: red, brown Foul Odor After Cleansing: No Slough/Fibrino Yes Wound Bed Granulation Amount: Medium (34-66%) Exposed Structure Granulation Quality: Pink Fascia Exposed: No Necrotic Amount: Medium (34-66%) Fat Layer (Subcutaneous Tissue) Exposed: Yes Necrotic Quality: Adherent Slough Tendon Exposed: No Muscle Exposed: Yes Necrosis of Muscle: No Joint Exposed: No Bone Exposed: No Todd  Notes Wound #2 (Sacrum) 3. Primary Dressing Applied Calcium Alginate Ag 4. Secondary Dressing ABD Pad 5. Secured With Secretary/administrator) Signed: 06/11/2020 5:47:32 PM By: Shawn Stall Signed: 06/11/2020 5:49:14 PM By: Zenaida Deed RN, BSN Previous Signature: 06/11/2020 2:18:08 PM Version By: Tracy Ito Entered By: Zenaida Deed on 06/11/2020 14:25:01 -------------------------------------------------------------------------------- Vitals Details Patient Name: Date of Service: Tracy Todd, Tracy Todd 06/11/2020 1:45 PM Medical Record Number: [id number] Patient Account Number: [id number] Date of Birth/Sex: Treating RN: 04/04/1966 (54 y.o. Tracy Sours Primary Care Jerica Creegan: Tracy Todd Other Clinician: Referring Chaylee Ehrsam: Treating Keila Turan/Extender: Tracy Sink in Todd: 9 Vital Signs Time Taken: 14:05 Temperature (F): 98.1 Height (in): 60 Pulse (bpm): 72 Weight (lbs): 103 Respiratory Rate (breaths/min): 18 Body Mass Index (BMI): 20.1 Blood Pressure (mmHg): 118/68 Reference Range: 80 - 120 mg / dl Electronic Signature(s) Signed: 06/11/2020 2:18:08 PM By: Tracy Ito Entered By: Tracy Ito on 06/11/2020 14:05:33

## 2020-06-11 NOTE — Progress Notes (Signed)
AHAAN, ZOBRIST ([id number]) Visit Report for 06/11/2020 HPI Details Patient Name: Date of Service: Tracy Todd, Tracy Todd 06/11/2020 1:45 PM Medical Record Number: [id number] Patient Account Number: [id number] Date of Birth/Sex: Treating RN: [birth year] (54 y.o. Tracy Todd Primary Care Provider: Antony Haste Other Clinician: Referring Provider: Treating Provider/Extender: Murtis Sink in Treatment: 9 History of Present Illness HPI Description: Lab69 year old Down syndrome patient who unfortunately developed 2 deep tissue pressure wounds while recuperating at skilled nursing facility in May, patient was admitted to [facility] health for syncopal event in April and after hospitalization was discharged to skilled nursing facility, his work-up included Doppler studies that showed right internal carotid artery stenosis for which she was placed on Plavix. After being at the skilled nursing facility patient developed 2 wounds and was admitted in June for an 8-day hospital stay for wound care. 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 He had a pelvic CT that showed ulceration extending along the right buttock into the medial margin of the gluteus maximus muscle without signs of osteomyelitis. Patient had wound care with bedside debridement and discharge to different facility at discharge. He is currently at a group home where he is getting home health to assist with wound care especially with central and daily dressing changes. And referred to the wound clinic for further care. Patient is nonverbal, and cannot follow instructions, sister and caregiver are present at bedside. According to them the wounds look "better" than before considering that he had black eschar covering the wounds and had some debridement performed that remove this layer. Patient's currently on iron supplements, Plavix, nutritional  supplements, lipid-lowering agent. 04/10/2020; this is a patient with 2 stage IV wounds in the lower sacrum and the left greater trochanter. He required debridement last week. They have been using Santyl but apparently they ran out of this and Medicaid would not cover a represcription. The patient has Down syndrome. In talking with his sister he is no longer verbal and nonambulatory this is a big deterioration over the last year. She states that she thinks this was a result of the first shot of pneumonia during a vaccine last January although I think he was having some problem before that. His caregiver is also here. Apparently eats and drinks well they crush his medications. They have an eggcrate pressure relief surface 8/27; left greater trochanter and lower sacrum both stage IV pressure ulcers in this very frail man with advanced Down syndrome likely underlying Alzheimer's disease. He apparently is eating and drinking well per his caregiver. They have some form of pressure relief bed. I put him on antibiotics last week because of erythema around the greater trochanter but I did not culture. He has a new superficial area over his left elbow 9/10; this is a patient who is very disabled. Presumably Down syndrome. It does not sound talking to his caregiver that he was ever that functional but he is currently better chair bound flexion contractures minimally verbal. 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 According to his caregiver she eats and drinks well. We have been using silver alginate to the wounds. I gave him a course of Levaquin 3 weeks ago empirically without cultures. His wound especially over the greater trochanter is worse today 9/24; 2-week follow-up. Patient's x-ray suggested acute osteomyelitis in the region of the proximal left femur and greater trochanter. They  recommended an MRI but I do not think we could get the patient through an MRI I  have ordered a CT scan with contrast. Culture grew E. coli that was resistant to trimethoprim sulfamethoxazole and quinolones. I have him on cefdinir which started on 9/13. They have 1 more tablet I am likely going to extend this Lab work I did on this man showed a white count of 3.82 with a reasonably normal differential. His C-reactive protein was elevated at 54.4 and sedimentation rate at 89. The rest the lab work was normal. Unfortunately they did not do a serum albumin I took some time today to talk to the patient's sister who is present about advanced medical directives. The patient has flexion contractures, barely verbal although he is eating eating and drinking well according to his caregiver. I asked him to think about the quality of this person day vis--vis the aggressiveness of medical care. She has a sister and the patient's father they promised to sit down and give [place] some sense of direction here. 10/9; patient went for his CT scan of the pelvis. Specifically over the area I was concerned about on the left greater trochanter there was suggestion of an abscess measuring 4.1 x 1 x 3.2. Also small areas of cortical erosion suggesting osteomyelitis. Over the sacrum and coccyx wound extending down to the cortex of the sacrococcygeal junction with a small amount of air contacting the left posterior aspect of the sacrococcygeal junction changes concerning for osteomyelitis. Culture I did of the left greater trochanter showed E. coli. This was resistant to quinolones and I believe trimethoprim sulfamethoxazole. He is allergic to penicillin I have been using cefdinir 300 twice daily for now completing 2 weeks. The patient arrives in clinic today with a wound over the left greater trochanter actually looking a lot better. I thought this might need an operative debridement but I cannot imagine a surgeon agreeing to do this in this setting with the wound is improved is much as it is today. In  particular there is no tenderness around here which is a big improvement. The large area over the sacrum/coccyx still has periwound tenderness but less erythema. There is no exposed bone at either site however the left greater trochanter is a considerable amount of undermining 10/21; patient saw infectious disease yesterday. He is going for a PICC line next Wednesday he was prescribed Flagyl. The wounds are about the same. We are using silver alginate. He is apparently eating and drinking well according to his family. They are offloading as well as they can. Electronic Signature(s) Signed: 06/11/2020 5:31:58 PM By: Baltazar Najjar MD Entered By: Baltazar Najjar on 06/11/2020 15:15:07 -------------------------------------------------------------------------------- Physical Exam Details Patient Name: Date of Service: Tracy Todd, Tracy Todd 06/11/2020 1:45 PM Medical Record Number: [id number] Patient Account Number: [id number] Date of Birth/Sex: Treating RN: 1966/02/04 (54 y.o. Tracy Todd Primary Care Provider: Antony Haste Other Clinician: Referring Provider: Treating Provider/Extender: Murtis Sink in Treatment: 9 Constitutional Sitting or standing Blood Pressure is within target range for patient.. Pulse regular and within target range for patient.Marland Kitchen Respirations regular, non-labored and within target range.Marland Kitchen Appears in no distress. Notes Wound exam; sacral wound looks about the same. There is still exposed bone. Left greater trochanter does not probe to bone dimensions are about the same with considerable undermining from roughly 6-9 o'clock. There is much less drainage. Still some surrounding Electronic Signature(s) Signed: 06/11/2020 5:31:58 PM By: Baltazar Najjar MD Entered By: Baltazar Najjar on 06/11/2020 15:15:49 --------------------------------------------------------------------------------  Physician Orders Details Patient Name: Date of Service: Tracy Todd, Tracy Todd 06/11/2020 1:45 PM Medical Record Number: [id number] Patient Account Number: [id number] Date of Birth/Sex: Treating RN: 1965/11/03 (54 y.o. Tracy Todd Primary Care Provider: Antony Haste Other Clinician: Referring Provider: Treating Provider/Extender: Murtis Sink in Treatment: 9 Verbal / Phone Orders: No Diagnosis Coding ICD-10 Coding Code Description [phone] Other acute osteomyelitis, left femur M86.18 Other acute osteomyelitis, other site L89.224 Pressure ulcer of left hip, stage 4 L89.154 Pressure ulcer of sacral region, stage 4 Q90.9 Down syndrome, unspecified Follow-up Appointments Return appointment in 3 weeks. - Staff transfers Dressing Change Frequency Wound #1 Left Trochanter Change dressing every day. Wound #2 Sacrum Change dressing every day. Skin Barriers/Peri-Wound Care Wound #1 Left Trochanter Skin Prep Wound #2 Sacrum Skin Prep Wound Cleansing Wound #1 Left Trochanter Clean wound with Wound Cleanser Wound #2 Sacrum Clean wound with Wound Cleanser Primary Wound Dressing Wound #1 Left Trochanter Calcium Alginate with Silver Wound #2 Sacrum Calcium Alginate with Silver Secondary Dressing Wound #1 Left Trochanter Foam Border - or ABD pad and tape Wound #2 Sacrum Foam Border - or ABD pad and tape Off-Loading Turn and reposition every 2 hours Home Health Continue Home Health skilled nursing for wound care. - [facility] Electronic Signature(s) Signed: 06/11/2020 5:31:58 PM By: Baltazar Najjar MD Signed: 06/11/2020 5:47:32 PM By: Shawn Stall Entered By: Shawn Stall on 06/11/2020 14:43:51 -------------------------------------------------------------------------------- Problem List Details Patient Name: Date of Service: Tracy Todd, Tracy Todd 06/11/2020 1:45 PM Medical Record Number: [id number] Patient Account Number: [id number] Date of Birth/Sex: Treating RN: Feb 23, 1966 (54 y.o. Tracy Todd Primary Care Provider:  Antony Haste Other Clinician: Referring Provider: Treating Provider/Extender: Murtis Sink in Treatment: 9 Active Problems ICD-10 Encounter Code Description Active Date MDM Diagnosis [phone] Other acute osteomyelitis, left femur 05/15/2020 No Yes M86.18 Other acute osteomyelitis, other site 05/29/2020 No Yes L89.224 Pressure ulcer of left hip, stage 4 04/10/2020 No Yes L89.154 Pressure ulcer of sacral region, stage 4 04/10/2020 No Yes Q90.9 Down syndrome, unspecified 04/03/2020 No Yes Inactive Problems ICD-10 Code Description Active Date Inactive Date L03.116 Cellulitis of left lower limb 04/10/2020 04/10/2020 L89.012 Pressure ulcer of right elbow, stage 2 04/17/2020 04/17/2020 Resolved Problems Electronic Signature(s) Signed: 06/11/2020 5:31:58 PM By: Baltazar Najjar MD Entered By: Baltazar Najjar on 06/11/2020 15:12:12 -------------------------------------------------------------------------------- Progress Note Details Patient Name: Date of Service: Tracy Todd, Tracy Todd 06/11/2020 1:45 PM Medical Record Number: [id number] Patient Account Number: [id number] Date of Birth/Sex: Treating RN: 06-28-1966 (54 y.o. Tracy Todd Primary Care Provider: Antony Haste Other Clinician: Referring Provider: Treating Provider/Extender: Murtis Sink in Treatment: 9 Subjective History of Present Illness (HPI) Lab35 year old Down syndrome patient who unfortunately developed 2 deep tissue pressure wounds while recuperating at skilled nursing facility in May, patient was admitted to [facility] health for syncopal event in April and after hospitalization was discharged to skilled nursing facility, his work-up included Doppler studies that showed right internal carotid artery stenosis for which she was placed on Plavix. After being at the skilled nursing facility patient developed 2 wounds and was admitted in June for an 8-day hospital stay for wound care. 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 He had a pelvic CT that showed ulceration extending along the right buttock into the medial margin of the gluteus maximus muscle without signs of osteomyelitis. Patient had wound care with bedside debridement and discharge to different  facility at discharge. He is currently at a group home where he is getting home health to assist with wound care especially with central and daily dressing changes. And referred to the wound clinic for further care. Patient is nonverbal, and cannot follow instructions, sister and caregiver are present at bedside. According to them the wounds look "better" than before considering that he had black eschar covering the wounds and had some debridement performed that remove this layer. Patient's currently on iron supplements, Plavix, nutritional supplements, lipid-lowering agent. 04/10/2020; this is a patient with 2 stage IV wounds in the lower sacrum and the left greater trochanter. He required debridement last week. They have been using Santyl but apparently they ran out of this and Medicaid would not cover a represcription. The patient has Down syndrome. In talking with his sister he is no longer verbal and nonambulatory this is a big deterioration over the last year. She states that she thinks this was a result of the first shot of pneumonia during a vaccine last January although I think he was having some problem before that. His caregiver is also here. Apparently eats and drinks well they crush his medications. They have an eggcrate pressure relief surface 8/27; left greater trochanter and lower sacrum both stage IV pressure ulcers in this very frail man with advanced Down syndrome likely underlying Alzheimer's disease. He apparently is eating and drinking well per his caregiver. They have some form of pressure relief bed. I put him on antibiotics last  week because of erythema around the greater trochanter but I did not culture. He has a new superficial area over his left elbow 9/10; this is a patient who is very disabled. Presumably Down syndrome. It does not sound talking to his caregiver that he was ever that functional but he is currently better chair bound flexion contractures minimally verbal. 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 According to his caregiver she eats and drinks well. We have been using silver alginate to the wounds. I gave him a course of Levaquin 3 weeks ago empirically without cultures. His wound especially over the greater trochanter is worse today 9/24; 2-week follow-up. Patient's x-ray suggested acute osteomyelitis in the region of the proximal left femur and greater trochanter. They recommended an MRI but I do not think we could get the patient through an MRI I have ordered a CT scan with contrast. Culture grew E. coli that was resistant to trimethoprim sulfamethoxazole and quinolones. I have him on cefdinir which started on 9/13. They have 1 more tablet I am likely going to extend this Lab work I did on this man showed a white count of 3.82 with a reasonably normal differential. His C-reactive protein was elevated at 54.4 and sedimentation rate at 89. The rest the lab work was normal. Unfortunately they did not do a serum albumin I took some time today to talk to the patient's sister who is present about advanced medical directives. The patient has flexion contractures, barely verbal although he is eating eating and drinking well according to his caregiver. I asked him to think about the quality of this person day vis--vis the aggressiveness of medical care. She has a sister and the patient's father they promised to sit down and give us some sense of direction here. 10/9; patient went for his CT scan of the pelvis. Specifically over the area I was concerned about on the  left greater trochanter there was suggestion of  an abscess measuring 4.1 x 1 x 3.2. Also small areas of cortical erosion suggesting osteomyelitis. Over the sacrum and coccyx wound extending down to the cortex of the sacrococcygeal junction with a small amount of air contacting the left posterior aspect of the sacrococcygeal junction changes concerning for osteomyelitis. Culture I did of the left greater trochanter showed E. coli. This was resistant to quinolones and I believe trimethoprim sulfamethoxazole. He is allergic to penicillin I have been using cefdinir 300 twice daily for now completing 2 weeks. The patient arrives in clinic today with a wound over the left greater trochanter actually looking a lot better. I thought this might need an operative debridement but I cannot imagine a surgeon agreeing to do this in this setting with the wound is improved is much as it is today. In particular there is no tenderness around here which is a big improvement. The large area over the sacrum/coccyx still has periwound tenderness but less erythema. There is no exposed bone at either site however the left greater trochanter is a considerable amount of undermining 10/21; patient saw infectious disease yesterday. He is going for a PICC line next Wednesday he was prescribed Flagyl. The wounds are about the same. We are using silver alginate. He is apparently eating and drinking well according to his family. They are offloading as well as they can. Objective Constitutional Sitting or standing Blood Pressure is within target range for patient.. Pulse regular and within target range for patient.Marland Kitchen Respirations regular, non-labored and within target range.Marland Kitchen Appears in no distress. Vitals Time Taken: 2:05 PM, Height: 60 in, Weight: 103 lbs, BMI: 20.1, Temperature: 98.1 F, Pulse: 72 bpm, Respiratory Rate: 18 breaths/min, Blood Pressure: 118/68 mmHg. General Notes: Wound exam; sacral wound looks about the same.  There is still exposed bone. ooLeft greater trochanter does not probe to bone dimensions are about the same with considerable undermining from roughly 6-9 o'clock. There is much less drainage. Still some surrounding Integumentary (Hair, Skin) Wound #1 status is Open. Original cause of wound was Gradually Appeared. The wound is located on the Left Trochanter. The wound measures 3cm length x 1.5cm width x 0.4cm depth; 3.534cm^2 area and 1.414cm^3 volume. There is muscle, tendon, and Fat Layer (Subcutaneous Tissue) exposed. There is no undermining noted, however, there is tunneling at 6:00 with a maximum distance of 2.3cm. There is a medium amount of serosanguineous drainage noted. The wound margin is well defined and not attached to the wound base. There is medium (34-66%) red, pink granulation within the wound bed. There is a medium (34- 66%) amount of necrotic tissue within the wound bed including Adherent Slough. Wound #2 status is Open. Original cause of wound was Gradually Appeared. The wound is located on the Sacrum. The wound measures 6cm length x 3.5cm width x 1cm depth; 16.493cm^2 area and 16.493cm^3 volume. There is muscle and Fat Layer (Subcutaneous Tissue) exposed. There is undermining starting at 10:00 and ending at 2:00 with a maximum distance of 2.8cm. There is a medium amount of serosanguineous drainage noted. The wound margin is well defined and not attached to the wound base. There is medium (34-66%) pink granulation within the wound bed. There is a medium (34-66%) amount of necrotic tissue within the wound bed including Adherent Slough. Assessment Active Problems ICD-10 Other acute osteomyelitis, left femur Other acute osteomyelitis, other site Pressure ulcer of left hip, stage 4 Pressure ulcer of sacral region, stage 4 Down syndrome, unspecified Plan Follow-up Appointments: Return appointment in 3 weeks. -  Staff transfers Dressing Change Frequency: Wound #1 Left  Trochanter: Change dressing every day. Wound #2 Sacrum: Change dressing every day. Skin Barriers/Peri-Wound Care: Wound #1 Left Trochanter: Skin Prep Wound #2 Sacrum: Skin Prep Wound Cleansing: Wound #1 Left Trochanter: Clean wound with Wound Cleanser Wound #2 Sacrum: Clean wound with Wound Cleanser Primary Wound Dressing: Wound #1 Left Trochanter: Calcium Alginate with Silver Wound #2 Sacrum: Calcium Alginate with Silver Secondary Dressing: Wound #1 Left Trochanter: Foam Border - or ABD pad and tape Wound #2 Sacrum: Foam Border - or ABD pad and tape Off-Loading: Turn and reposition every 2 hours Home Health: Continue Home Health skilled nursing for wound care. - [facility] 1. Silver alginate as a primary dressing no change 2. IV antibiotics 3. His daughter asked me the chances of healing this and that even optimistically it is probably less than 20% and that is with the 100% offloading. 4. I am not planning to change the primary dressing until we have some time to get antibiotics into him. Electronic Signature(s) Signed: 06/11/2020 5:31:58 PM By: Baltazar Najjar MD Entered By: Baltazar Najjar on 06/11/2020 15:17:10 -------------------------------------------------------------------------------- SuperBill Details Patient Name: Date of Service: Tracy Todd, Tracy Todd 06/11/2020 Medical Record Number: [id number] Patient Account Number: [id number] Date of Birth/Sex: Treating RN: 05/08/1966 (54 y.o. Tracy Todd Primary Care Provider: Antony Haste Other Clinician: Referring Provider: Treating Provider/Extender: Murtis Sink in Treatment: 9 Diagnosis Coding ICD-10 Codes Code Description [phone] Other acute osteomyelitis, left femur M86.18 Other acute osteomyelitis, other site L89.224 Pressure ulcer of left hip, stage 4 L89.154 Pressure ulcer of sacral region, stage 4 Q90.9 Down syndrome, unspecified Facility Procedures CPT4 Code:  [id number] Description: 99214 - WOUND CARE VISIT-LEV 4 EST PT Modifier: Quantity: 1 Physician Procedures : CPT4 Code Description Modifier [id number] 99213 - WC PHYS LEVEL 3 - EST PT ICD-10 Diagnosis Description L89.224 Pressure ulcer of left hip, stage 4 L89.154 Pressure ulcer of sacral region, stage 4 M86.152 Other acute osteomyelitis, left femur M86.18 Other  acute osteomyelitis, other site Quantity: 1 Electronic Signature(s) Signed: 06/11/2020 5:31:58 PM By: Baltazar Najjar MD Entered By: Baltazar Najjar on 06/11/2020 15:17:36

## 2020-06-11 NOTE — Telephone Encounter (Addendum)
I have spoke with Corrie Dandy with [facility] to let her know the patient will get his first dose on 06/17/20. Mary verbalized understanding. II have also spoke to Deanna with [facility] and advised her when patient is scheduled for Picc placement and will receive first dose at short stay. Deanna verbalized understanding. Aydia Maj T [name]

## 2020-06-11 NOTE — Telephone Encounter (Addendum)
Spoke with patient's caregiver Dorthy to give her appointment information for the patient. She requested the appointment be changed to a Wednesday due to the patient having to have sedation and having to arrive at 7am. Called and left a voicemail for [place] with IR to reschedule the patient to Wednesday morning appointment for Picc Placement. Edrie Ehrich T [name]

## 2020-06-11 NOTE — Telephone Encounter (Signed)
Tracy Todd called to let Dr Elinor Parkinson know that they have been notified they were exposed to Covid on Tuesday 10/19. They have been told to quarantine in the home for 14 days. Please advise on plan for antibiotic treatment. [name], RN

## 2020-06-11 NOTE — Telephone Encounter (Signed)
Spoke to [place] with IR and she has rescheduled the patient to 06/17/20 arrival time 12:30 pm. Patent will need to go to short stay first for sedation and NPO after 8 am on 06/17/20 per Victorino Dike. Patient's caregiver Nicole Cella has been given instructions for the patient and verbalized understanding. Orders for short stay have also been faxed. Patient's caregiver would like for patient to use [facility] for nursing, patient is currently already using them for nursing. I spoke with Carly with [facility] and she stated that would be fine. However, medication will have to go through [facility], because [facility] does not have a pharmacy, patient's caregiver is aware and ok with this. Orders have been faxed to [facility] and [facility] [phone]). Maha Fischel T [name]

## 2020-06-12 ENCOUNTER — Other Ambulatory Visit: Payer: Self-pay | Admitting: Infectious Diseases

## 2020-06-12 MED ORDER — CEFDINIR 300 MG PO CAPS: 300.0000 mg | ORAL_CAPSULE | Freq: Two times a day (BID) | ORAL | 0 refills | Status: DC

## 2020-06-12 NOTE — Telephone Encounter (Signed)
Continue cefdinir as patient was taking before and add metronidazole 500mg  PO TID until PICC line can be done.

## 2020-06-12 NOTE — Telephone Encounter (Signed)
Spoke with Deanna with [facility] to advise her that patient will not be getting Picc placed on 06/17/20. Per Deanna new order will need to be sent for the patient when Picc is placed. Also spoke with Tim with [facility] to let him know patient will not get Picc placed on 06/17/20 and new order will be faxed to them when Picc is placed.  Nakenya Theall T [name]

## 2020-06-12 NOTE — Telephone Encounter (Signed)
Left message for Tracy Todd with IR informing her to cancel the patient's Picc placement appointment due to Covid exposure and we will call back when patient is ready to reschedule.  Alen Matheson T [name]

## 2020-06-12 NOTE — Telephone Encounter (Signed)
Spoke with Nicole Cella the patient's caregiver and advised her to have the patient continue the cefdinir and add the metronidazole that was prescribed. Patient will need a refills on cefdinir. Dorothy also advised to call our office when patient's quarantine is over. Dorothy verbalized understanding. I will contact Victorino Dike at IR to cancel PICC placement, also let [facility] and [facility] know patient will not bee getting Picc line placed yet. Lin Hackmann T [name]

## 2020-06-15 ENCOUNTER — Ambulatory Visit (HOSPITAL_COMMUNITY): Admission: RE | Admit: 2020-06-15 | Payer: Medicare Other | Source: Ambulatory Visit

## 2020-06-17 ENCOUNTER — Inpatient Hospital Stay (HOSPITAL_COMMUNITY): Admission: RE | Admit: 2020-06-17 | Payer: Medicare Other | Source: Ambulatory Visit

## 2020-06-17 ENCOUNTER — Ambulatory Visit (HOSPITAL_COMMUNITY): Payer: Medicare Other

## 2020-06-18 ENCOUNTER — Telehealth: Payer: Self-pay

## 2020-06-18 NOTE — Telephone Encounter (Signed)
Patient's sister Misty Stanley is calling regarding patient was not able to have PICC placed due to order to quarantine for 14 days for exposure to person with COVID.   They would like the appointments rescheduled after June 23, 2020. Please call with updated information.   Laurell Josephs, RN

## 2020-06-18 NOTE — Telephone Encounter (Signed)
I called Tracy Todd with IR and left a message to callback to get the patient scheduled for PICC placement. Tracy Todd T [name]

## 2020-06-19 NOTE — Telephone Encounter (Signed)
Called Kingsley with IR and left voicemail to follow up with getting the patient scheduled for PICC placement  Sameka Bagent T [name]

## 2020-06-22 ENCOUNTER — Inpatient Hospital Stay (HOSPITAL_COMMUNITY)
Admission: EM | Admit: 2020-06-22 | Discharge: 2020-07-20 | DRG: 871 | Disposition: A | Discharge status: Skilled Nursing Facility | Payer: Medicare Other | Attending: Internal Medicine | Admitting: Internal Medicine

## 2020-06-22 ENCOUNTER — Emergency Department (HOSPITAL_COMMUNITY): Payer: Medicare Other

## 2020-06-22 ENCOUNTER — Encounter (HOSPITAL_COMMUNITY): Payer: Self-pay

## 2020-06-22 ENCOUNTER — Other Ambulatory Visit: Payer: Self-pay

## 2020-06-22 DIAGNOSIS — L89223 Pressure ulcer of left hip, stage 3: Secondary | ICD-10-CM | POA: Diagnosis present

## 2020-06-22 DIAGNOSIS — R1312 Dysphagia, oropharyngeal phase: Secondary | ICD-10-CM | POA: Diagnosis present

## 2020-06-22 DIAGNOSIS — R32 Unspecified urinary incontinence: Secondary | ICD-10-CM | POA: Diagnosis present

## 2020-06-22 DIAGNOSIS — Z23 Encounter for immunization: Secondary | ICD-10-CM | POA: Diagnosis present

## 2020-06-22 DIAGNOSIS — A4189 Other specified sepsis: Principal | ICD-10-CM | POA: Diagnosis present

## 2020-06-22 DIAGNOSIS — E785 Hyperlipidemia, unspecified: Secondary | ICD-10-CM | POA: Diagnosis present

## 2020-06-22 DIAGNOSIS — R159 Full incontinence of feces: Secondary | ICD-10-CM | POA: Diagnosis present

## 2020-06-22 DIAGNOSIS — J189 Pneumonia, unspecified organism: Secondary | ICD-10-CM | POA: Diagnosis not present

## 2020-06-22 DIAGNOSIS — D509 Iron deficiency anemia, unspecified: Secondary | ICD-10-CM | POA: Diagnosis present

## 2020-06-22 DIAGNOSIS — Z7982 Long term (current) use of aspirin: Secondary | ICD-10-CM

## 2020-06-22 DIAGNOSIS — E876 Hypokalemia: Secondary | ICD-10-CM | POA: Diagnosis present

## 2020-06-22 DIAGNOSIS — Z66 Do not resuscitate: Secondary | ICD-10-CM | POA: Diagnosis not present

## 2020-06-22 DIAGNOSIS — I472 Ventricular tachycardia: Secondary | ICD-10-CM | POA: Diagnosis not present

## 2020-06-22 DIAGNOSIS — J690 Pneumonitis due to inhalation of food and vomit: Secondary | ICD-10-CM | POA: Diagnosis present

## 2020-06-22 DIAGNOSIS — A419 Sepsis, unspecified organism: Secondary | ICD-10-CM | POA: Diagnosis present

## 2020-06-22 DIAGNOSIS — U071 COVID-19: Secondary | ICD-10-CM | POA: Diagnosis not present

## 2020-06-22 DIAGNOSIS — J9601 Acute respiratory failure with hypoxia: Secondary | ICD-10-CM | POA: Diagnosis present

## 2020-06-22 DIAGNOSIS — E038 Other specified hypothyroidism: Secondary | ICD-10-CM | POA: Diagnosis present

## 2020-06-22 DIAGNOSIS — Z79899 Other long term (current) drug therapy: Secondary | ICD-10-CM | POA: Diagnosis not present

## 2020-06-22 DIAGNOSIS — L89154 Pressure ulcer of sacral region, stage 4: Secondary | ICD-10-CM | POA: Diagnosis present

## 2020-06-22 DIAGNOSIS — M869 Osteomyelitis, unspecified: Secondary | ICD-10-CM | POA: Diagnosis not present

## 2020-06-22 DIAGNOSIS — Q909 Down syndrome, unspecified: Secondary | ICD-10-CM | POA: Diagnosis not present

## 2020-06-22 DIAGNOSIS — D539 Nutritional anemia, unspecified: Secondary | ICD-10-CM | POA: Diagnosis present

## 2020-06-22 DIAGNOSIS — M4628 Osteomyelitis of vertebra, sacral and sacrococcygeal region: Secondary | ICD-10-CM | POA: Diagnosis present

## 2020-06-22 DIAGNOSIS — M8600 Acute hematogenous osteomyelitis, unspecified site: Secondary | ICD-10-CM | POA: Diagnosis not present

## 2020-06-22 DIAGNOSIS — L89304 Pressure ulcer of unspecified buttock, stage 4: Secondary | ICD-10-CM | POA: Diagnosis not present

## 2020-06-22 DIAGNOSIS — E43 Unspecified severe protein-calorie malnutrition: Secondary | ICD-10-CM | POA: Diagnosis present

## 2020-06-22 DIAGNOSIS — R6520 Severe sepsis without septic shock: Secondary | ICD-10-CM | POA: Diagnosis present

## 2020-06-22 DIAGNOSIS — Z681 Body mass index (BMI) 19 or less, adult: Secondary | ICD-10-CM

## 2020-06-22 DIAGNOSIS — E871 Hypo-osmolality and hyponatremia: Secondary | ICD-10-CM | POA: Diagnosis not present

## 2020-06-22 DIAGNOSIS — J1282 Pneumonia due to coronavirus disease 2019: Secondary | ICD-10-CM | POA: Diagnosis not present

## 2020-06-22 DIAGNOSIS — E1169 Type 2 diabetes mellitus with other specified complication: Secondary | ICD-10-CM | POA: Diagnosis not present

## 2020-06-22 DIAGNOSIS — D72810 Lymphocytopenia: Secondary | ICD-10-CM | POA: Diagnosis present

## 2020-06-22 DIAGNOSIS — E870 Hyperosmolality and hypernatremia: Secondary | ICD-10-CM | POA: Diagnosis present

## 2020-06-22 DIAGNOSIS — Z7401 Bed confinement status: Secondary | ICD-10-CM | POA: Diagnosis not present

## 2020-06-22 DIAGNOSIS — I493 Ventricular premature depolarization: Secondary | ICD-10-CM | POA: Diagnosis present

## 2020-06-22 DIAGNOSIS — G40909 Epilepsy, unspecified, not intractable, without status epilepticus: Secondary | ICD-10-CM | POA: Diagnosis present

## 2020-06-22 DIAGNOSIS — L8990 Pressure ulcer of unspecified site, unspecified stage: Secondary | ICD-10-CM | POA: Diagnosis present

## 2020-06-22 LAB — COMPREHENSIVE METABOLIC PANEL WITH GFR
ALT: 45 U/L — ABNORMAL HIGH (ref 0–44)
AST: 59 U/L — ABNORMAL HIGH (ref 15–41)
Albumin: 2.9 g/dL — ABNORMAL LOW (ref 3.5–5.0)
Alkaline Phosphatase: 94 U/L (ref 38–126)
Anion gap: 12 (ref 5–15)
BUN: 41 mg/dL — ABNORMAL HIGH (ref 6–20)
CO2: 26 mmol/L (ref 22–32)
Calcium: 8.4 mg/dL — ABNORMAL LOW (ref 8.9–10.3)
Chloride: 114 mmol/L — ABNORMAL HIGH (ref 98–111)
Creatinine, Ser: 0.9 mg/dL (ref 0.61–1.24)
GFR, Estimated: >60 mL/min
Glucose, Bld: 130 mg/dL — ABNORMAL HIGH (ref 70–99)
Potassium: 3.5 mmol/L (ref 3.5–5.1)
Sodium: 152 mmol/L — ABNORMAL HIGH (ref 135–145)
Total Bilirubin: 0.7 mg/dL (ref 0.3–1.2)
Total Protein: 6.9 g/dL (ref 6.5–8.1)

## 2020-06-22 LAB — URINALYSIS, ROUTINE W REFLEX MICROSCOPIC
Bacteria, UA: NONE SEEN
Bilirubin Urine: NEGATIVE
Glucose, UA: NEGATIVE mg/dL
Hgb urine dipstick: NEGATIVE
Ketones, ur: NEGATIVE mg/dL
Nitrite: NEGATIVE
Protein, ur: NEGATIVE mg/dL
Specific Gravity, Urine: 1.035 — ABNORMAL HIGH (ref 1.005–1.030)
pH: 5 (ref 5.0–8.0)

## 2020-06-22 LAB — CBC WITH DIFFERENTIAL/PLATELET
Abs Immature Granulocytes: 0.02 10*3/uL (ref 0.00–0.07)
Basophils Absolute: 0 10*3/uL (ref 0.0–0.1)
Basophils Relative: 1 %
Eosinophils Absolute: 0 10*3/uL (ref 0.0–0.5)
Eosinophils Relative: 0 %
HCT: 45.7 % (ref 39.0–52.0)
Hemoglobin: 13.1 g/dL (ref 13.0–17.0)
Immature Granulocytes: 0 %
Lymphocytes Relative: 11 %
Lymphs Abs: 0.8 10*3/uL (ref 0.7–4.0)
MCH: 30.3 pg (ref 26.0–34.0)
MCHC: 28.7 g/dL — ABNORMAL LOW (ref 30.0–36.0)
MCV: 105.5 fL — ABNORMAL HIGH (ref 80.0–100.0)
Monocytes Absolute: 0.3 10*3/uL (ref 0.1–1.0)
Monocytes Relative: 4 %
Neutro Abs: 6.3 10*3/uL (ref 1.7–7.7)
Neutrophils Relative %: 84 %
Platelets: 438 10*3/uL — ABNORMAL HIGH (ref 150–400)
RBC: 4.33 MIL/uL (ref 4.22–5.81)
RDW: 17.9 % — ABNORMAL HIGH (ref 11.5–15.5)
WBC: 7.5 10*3/uL (ref 4.0–10.5)
nRBC: 0 % (ref 0.0–0.2)

## 2020-06-22 LAB — RESPIRATORY PANEL BY RT PCR (FLU A&B, COVID)
Influenza A by PCR: NEGATIVE
Influenza B by PCR: NEGATIVE
SARS Coronavirus 2 by RT PCR: NEGATIVE

## 2020-06-22 LAB — APTT: aPTT: 32 seconds (ref 24–36)

## 2020-06-22 LAB — PROTIME-INR
INR: 1.7 — ABNORMAL HIGH (ref 0.8–1.2)
Prothrombin Time: 19 s — ABNORMAL HIGH (ref 11.4–15.2)

## 2020-06-22 LAB — LACTIC ACID, PLASMA
Lactic Acid, Venous: 1.5 mmol/L (ref 0.5–1.9)
Lactic Acid, Venous: 1.9 mmol/L (ref 0.5–1.9)

## 2020-06-22 MED ORDER — VANCOMYCIN HCL IN DEXTROSE 1-5 GM/200ML-% IV SOLN
1000.0000 mg | Freq: Once | INTRAVENOUS | Status: AC
  Administered 2020-06-22: 1000 mg via INTRAVENOUS
  Filled 2020-06-22: qty 200

## 2020-06-22 MED ORDER — LACTATED RINGERS IV BOLUS (SEPSIS)
1000.0000 mL | Freq: Once | INTRAVENOUS | Status: AC
  Administered 2020-06-22: 1000 mL via INTRAVENOUS

## 2020-06-22 MED ORDER — METRONIDAZOLE IN NACL 5-0.79 MG/ML-% IV SOLN
500.0000 mg | Freq: Once | INTRAVENOUS | Status: AC
  Administered 2020-06-22: 500 mg via INTRAVENOUS
  Filled 2020-06-22: qty 100

## 2020-06-22 MED ORDER — IOHEXOL 300 MG/ML  SOLN
100.0000 mL | Freq: Once | INTRAMUSCULAR | Status: AC | PRN
  Administered 2020-06-22: 100 mL via INTRAVENOUS

## 2020-06-22 MED ORDER — LACTATED RINGERS IV SOLN: INTRAVENOUS | Status: AC

## 2020-06-22 MED ORDER — LACTATED RINGERS IV BOLUS (SEPSIS)
500.0000 mL | Freq: Once | INTRAVENOUS | Status: AC
  Administered 2020-06-22: 500 mL via INTRAVENOUS

## 2020-06-22 MED ORDER — LEVOFLOXACIN 750 MG PO TABS: 750.0000 mg | ORAL_TABLET | Freq: Every day | ORAL | Status: DC

## 2020-06-22 MED ORDER — SODIUM CHLORIDE 0.9 % IV SOLN: 2.0000 g | Freq: Once | INTRAVENOUS | Status: DC

## 2020-06-22 MED ORDER — SODIUM CHLORIDE 0.9 % IV SOLN
2.0000 g | Freq: Once | INTRAVENOUS | Status: AC
  Administered 2020-06-22: 2 g via INTRAVENOUS
  Filled 2020-06-22: qty 2

## 2020-06-22 NOTE — ED Notes (Signed)
Pt placed on 2L [place] due to O2 saturation ranging from 84-88% while resting

## 2020-06-22 NOTE — Telephone Encounter (Signed)
Spoke with Victorino Dike with IR and patient is scheduled for 06/25/20 at 10:30 am, NPO after midnight and patient can take morning medications with water.  Tracy Todd T [name]

## 2020-06-22 NOTE — Telephone Encounter (Signed)
Spoke with Tracy Todd patient's caregiver and she wanted our office to be aware that she has had to call EMS for the patient due to him having a lot of chest congestion. Dorothy reported no other symptoms. She stated she would call back if patient is unable to make it to his appointment Thursday 06/25/20. Naoki Migliaccio T [name]

## 2020-06-22 NOTE — ED Provider Notes (Signed)
[facility]-EMERGENCY DEPT Provider Note   CSN: [id number] Arrival date & time: 06/22/20  1531     History No chief complaint on file.   Tracy Todd is a 54 y.o. 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  His sister is present at bedside, who is his primary caretaker and also his power of attorney.  She reports that her home health aide noted that the patient was running a fever at home, was tachycardic and seemed to be coughing and breathing faster than normal.  They were worried about an infection brought into the ER.  The patient himself is minimally verbal cannot provide me any history.  His sister says that he used to be walking in the past, for the past 7 to 8 months he has been bedbound, with steady and chronic decline in mental status and function.  She believes this began around the time that he received a second dose of the Covid vaccine.  She reports he is on treatment with 2 antibiotics for osteomyelitis at this time.  He follows with infectious disease for this.  Per medical record review, the patient most recently the CT of the pelvis performed on May 21, 2020.  This showed soft tissue ulceration of the left greater trochanter, small abscess adjacent to the greater trochanter, evidence of osteomyelitis along the periphery of the greater trochanter.  He also appeared to have spinous process involvement at the sacrococcygeal junction concerning for osteomyelitis.  Medical record review, the patient is also taking Flagyl as well as cefdinir p.o.  The sister reports that he was pending a PICC line placement for IV antibiotics but has not had this done yet.  HPI     Past Medical History:  Diagnosis Date  . Arthritis   . Down's syndrome   . Headache   . Hyperlipidemia   . Iron  deficiency anemia   . Recurrent syncope   . Seizure disorder (HCC) 05/21/2020  . Seizures [facility], [facility], [facility](HCC)     Patient Active Problem List   Diagnosis Date Noted  . Sepsis (HCC) 06/22/2020  . Seizure disorder (HCC) 05/21/2020  . Pressure injury of skin 02/06/2020  . Wound infection 02/05/2020  . Iron deficiency anemia 09/17/2019  . Elevated troponin 09/17/2019  . Syncope 09/16/2019  . Down syndrome 12/27/2012  . Arthritis 10/25/2011  . Hyperlipidemia 10/25/2011    History reviewed. No pertinent surgical history.     History reviewed. No pertinent family history.  Social History   Tobacco Use  . Smoking status: Never Smoker  . Smokeless tobacco: Never Used  Vaping Use  . Vaping Use: Never used  Substance Use Topics  . Alcohol use: Never  . Drug use: Never    Home Medications Prior to Admission medications   Medication Sig Start Date End Date Taking? Authorizing Provider  acetaminophen (TYLENOL) 500 MG tablet Take 500 mg by mouth every 6 (six) hours as needed for moderate pain.  04/14/20 04/14/21 Yes [provider]  Amino Acids-Protein Hydrolys (FEEDING SUPPLEMENT, PRO-STAT SUGAR FREE 64,) LIQD Take 30 mLs by mouth 2 (two) times daily. To aid in wound healing; resident prefers Prostat mixed in water. 02/14/20  Yes [provider]  aspirin 81 MG EC tablet Take 1 tablet (81 mg total) by mouth daily. 05/21/20  Yes York SpanielWillis, Charles K, MD  benzocaine (AMERICAINE) 20 % oral  spray Apply 1 application topically 3 (three) times daily as needed for throat irritation / pain.  03/18/20 03/18/21 Yes [provider]  bisacodyl (DULCOLAX) 10 MG suppository If not relieved by MOM, give 10 mg Bisacodyl suppositiory rectally X 1 dose in 24 hours as needed (Do not use constipation standing orders for residents with renal failure/CFR less than 30. Contact MD for orders) (Physician Order)  02/13/20  Yes [provider]  cefdinir (OMNICEF) 300 MG capsule Take 1 capsule (300 mg  total) by mouth 2 (two) times daily. 06/12/20  Yes Odette Fraction, MD  cetirizine (ZYRTEC) 10 MG tablet Take 10 mg by mouth daily as needed for allergies.    Yes [provider]  diclofenac Sodium (VOLTAREN) 1 % GEL Apply 2 g topically 4 (four) times daily. To both knees 03/03/20  Yes Ngetich, Dinah C, NP  Ensure Plus (ENSURE PLUS) LIQD Take 237 mLs by mouth 2 (two) times daily between meals.    Yes [provider]  FIBER ADULT GUMMIES PO Take 1 tablet by mouth in the morning and at bedtime.    Yes [provider]  finasteride (PROSCAR) 5 MG tablet Take 5 mg by mouth daily. 04/30/20  Yes [provider]  hydrocortisone 2.5 % cream Apply 1 application topically 2 (two) times daily as needed (skin rash).  04/14/20 04/14/21 Yes [provider]  iron polysaccharides (NU-IRON) 150 MG capsule Take 1 capsule (150 mg total) by mouth daily. 03/03/20  Yes Ngetich, Dinah C, NP  ketoconazole (NIZORAL) 2 % shampoo Apply 1 application topically See admin instructions. Use to wash hair and face, alternate by week. 03/03/20  Yes Ngetich, Dinah C, NP  levETIRAcetam (KEPPRA) 500 MG tablet Take 1 tablet (500 mg total) by mouth 2 (two) times daily. 03/03/20  Yes Ngetich, Dinah C, NP  magnesium hydroxide (MILK OF MAGNESIA) 400 MG/5ML suspension If no BM in 3 days, give 30 cc Milk of Magnesium p.o. x 1 dose in 24 hours as needed (Do not use standing constipation orders for residents with renal failure CFR less than 30. Contact MD for orders) (Physician Order)  02/13/20  Yes [provider]  Multiple Vitamins-Minerals (MULTIVITAMIN WITH MINERALS) tablet Take 1 tablet by mouth daily.   Yes [provider]  nutrition supplement, JUVEN, (JUVEN) PACK Take 1 packet by mouth 2 (two) times daily between meals. To aid in wound healing. 02/14/20  Yes [provider]  pravastatin (PRAVACHOL) 40 MG tablet Take 1 tablet (40 mg total) by mouth daily. 03/03/20  Yes Ngetich,  Dinah C, NP  SANTYL ointment Apply 1 application topically daily.  04/14/20  Yes [provider]  selenium sulfide (SELSUN) 1 % LOTN Apply 1 application topically daily. 03/03/20  Yes Ngetich, Dinah C, NP  Sodium Phosphates (RA SALINE ENEMA RE) If not relieved by Biscodyl suppository, give disposable Saline Enema rectally X 1 dose/24 hrs as needed (Do not use constipation standing orders for residents with renal failure/CFR less than 30. Contact MD for orders)(Physician Or  02/13/20  Yes [provider]  vitamin B-12 (CYANOCOBALAMIN) 500 MCG tablet Take 1 tablet (500 mcg total) by mouth daily. 03/03/20  Yes Ngetich, Dinah C, NP  zinc gluconate 50 MG tablet Take 1 tablet (50 mg total) by mouth daily. 03/03/20  Yes Ngetich, Dinah C, NP  Disposable Gloves MISC Use gloves for diaper changes.Dispense 2 boxes 03/19/20   [provider]  Incontinence Supply Disposable (COMFORT SHIELD ADULT DIAPERS) MISC Dispense 5 boxes/mo.  Use 4-6 times daily for fecal incontinence and diarrhea. 03/19/20   [provider]  Incontinence Supply Disposable (RA ADULT WIPES) MISC Please dispense 4 boxes/wipes per month. 03/19/20   [provider]  Incontinence Supply Disposable (UNDERPADS MEDIUM) MISC Dispense 3 boxes/mo for fecal and urinary incontinence and diarrhea. 03/19/20   [provider]  metroNIDAZOLE (FLAGYL) 500 MG tablet Take 1 tablet (500 mg total) by mouth 3 (three) times daily. 06/10/20   Odette Fraction, MD  Misc. Devices MISC Take by mouth.  03/18/20   [provider]  Misc. Devices MISC Manual wheelchair. 16 inch wide with elevating leg rests, high reclining back and neck support and 16-inch cushion. 03/18/20   [provider]  NON FORMULARY Regular liberalized diet d/t Dx Malnutrition.  02/14/20   [provider]  UNABLE TO FIND Med Name: anti dandruff shampoo    [provider]    Allergies    Penicillins  Review of Systems     Review of Systems  Unable to perform ROS: Patient nonverbal (level 5 caveat - down syndrome)    Physical Exam Updated Vital Signs BP (!) 164/98 (BP Location: Right Arm)   Pulse 77   Temp 98 F (36.7 C) (Oral)   Resp 14   Ht 5' (1.524 m)   Wt 40.8 kg   SpO2 99%   BMI 17.58 kg/m   Physical Exam Vitals and nursing note reviewed.  Constitutional:      Appearance: He is well-developed.     Comments: Extremely thin, chronically ill appearing  HENT:     Head: Normocephalic and atraumatic.  Eyes:     Conjunctiva/sclera: Conjunctivae normal.     Pupils: Pupils are equal, round, and reactive to light.  Cardiovascular:     Rate and Rhythm: Regular rhythm. Tachycardia present.     Pulses: Normal pulses.  Pulmonary:     Effort: Pulmonary effort is normal. No respiratory distress.     Comments: Crackles in lung bases 96% on room air RR 28 Abdominal:     General: There is no distension.     Palpations: Abdomen is soft.     Tenderness: There is no abdominal tenderness. There is no guarding.  Musculoskeletal:     Cervical back: Neck supple.  Skin:    General: Skin is warm and dry.     Comments: Stage 4 sacral decubitus ulceration that tracks to bone Stage 3 decubitus ulcer of left greater trochanter Wounds are non-purulent, with clean dressing, pink edges to wounds  Neurological:     Mental Status: He is alert. Mental status is at baseline.     ED Results / Procedures / Treatments   Labs (all labs ordered are listed, but only abnormal results are displayed) Labs Reviewed  COMPREHENSIVE METABOLIC PANEL - Abnormal; Notable for the following components:      Result Value   Sodium 152 (*)    Chloride 114 (*)    Glucose, Bld 130 (*)    BUN 41 (*)    Calcium 8.4 (*)    Albumin 2.9 (*)    AST 59 (*)    ALT 45 (*)    All other components within normal limits  CBC WITH DIFFERENTIAL/PLATELET - Abnormal; Notable for the following components:   MCV 105.5 (*)    MCHC 28.7 (*)     RDW 17.9 (*)    Platelets 438 (*)    All other components within normal limits  PROTIME-INR - Abnormal; Notable  for the following components:   Prothrombin Time 19.0 (*)    INR 1.7 (*)    All other components within normal limits  URINALYSIS, ROUTINE W REFLEX MICROSCOPIC - Abnormal; Notable for the following components:   Specific Gravity, Urine 1.035 (*)    Leukocytes,Ua TRACE (*)    All other components within normal limits  LACTIC ACID, PLASMA - Abnormal; Notable for the following components:   Lactic Acid, Venous 2.1 (*)    All other components within normal limits  PROTIME-INR - Abnormal; Notable for the following components:   Prothrombin Time 18.9 (*)    INR 1.7 (*)    All other components within normal limits  COMPREHENSIVE METABOLIC PANEL - Abnormal; Notable for the following components:   Glucose, Bld 101 (*)    BUN 21 (*)    Calcium 7.9 (*)    Total Protein 5.1 (*)    Albumin 2.2 (*)    All other components within normal limits  CBC - Abnormal; Notable for the following components:   RBC 3.45 (*)    Hemoglobin 10.5 (*)    HCT 36.9 (*)    MCV 107.0 (*)    MCHC 28.5 (*)    RDW 17.3 (*)    All other components within normal limits  CULTURE, BLOOD (ROUTINE X 2)  CULTURE, BLOOD (ROUTINE X 2)  RESPIRATORY PANEL BY RT PCR (FLU A&B, COVID)  URINE CULTURE  MRSA PCR SCREENING  LACTIC ACID, PLASMA  LACTIC ACID, PLASMA  APTT  CORTISOL-AM, BLOOD  PROCALCITONIN    EKG None  Radiology CT Chest W Contrast  Result Date: 06/22/2020 CLINICAL DATA:  Tachypnea, sacral decubitus ulcer, fever, sepsis EXAM: CT CHEST, ABDOMEN, AND PELVIS WITH CONTRAST TECHNIQUE: Multidetector CT imaging of the chest, abdomen and pelvis was performed following the standard protocol during bolus administration of intravenous contrast. CONTRAST:  OMNIPAQUE IOHEXOL 300 MG/ML  SOLN COMPARISON:  06/22/2020, 09/16/2019, 02/05/2020 FINDINGS: CT CHEST FINDINGS Cardiovascular: The heart and great  vessels are unremarkable without pericardial effusion. Normal caliber of the thoracic aorta. Mediastinum/Nodes: No enlarged mediastinal, hilar, or axillary lymph nodes. Thyroid gland, trachea, and esophagus demonstrate no significant findings. Lungs/Pleura: Dependent right lower lobe consolidation could reflect airspace disease or atelectasis. No effusion or pneumothorax. Mucoid material is seen within the right lower lobe bronchus. No effusion or pneumothorax. Musculoskeletal: No acute or destructive bony lesions. Reconstructed images demonstrate no additional findings. CT ABDOMEN PELVIS FINDINGS Hepatobiliary: No focal liver abnormality is seen. No gallstones, gallbladder wall thickening, or biliary dilatation. Pancreas: Unremarkable. No pancreatic ductal dilatation or surrounding inflammatory changes. Spleen: Normal in size without focal abnormality. Adrenals/Urinary Tract: There is a punctate calcification layering dependently within the bladder. No other urinary tract calculi or signs of obstruction. The kidneys enhance normally and symmetrically. The adrenals are unremarkable. Stomach/Bowel: No bowel obstruction or ileus. No bowel wall thickening or inflammatory change. Vascular/Lymphatic: No significant vascular findings are present. No enlarged abdominal or pelvic lymph nodes. Reproductive: Prostate is unremarkable. Other: No free fluid or free gas. No abdominal wall hernia. Musculoskeletal: Persistent decubitus ulceration overlying the distal sacrum and coccyx. Since the prior exam, erosive changes are seen within the dorsal aspect of the lower sacrum at approximately the S3 and S4 levels. No periosteal reaction. Bilateral L5 pars defects and grade 2 anterolisthesis of L5 on S1 unchanged. Stable chronic compression deformities involving the superior endplates of the L3 and L4 vertebral bodies. Severe spondylosis at L5/S1 unchanged. Reconstructed images demonstrate no additional findings. IMPRESSION:  1.  Dependent right lower lobe consolidation with mucoid material in the right lower lobe bronchus. Findings could reflect mucous plugging, aspiration, or infection. 2. Persistent decubitus ulceration overlying the distal sacrum and coccyx. Since the prior exam, erosive changes within the dorsal aspect of the lower sacrum at approximately the S3 and S4 levels, consistent with chronic osteomyelitis. 3. Punctate nonobstructing calculus within the bladder lumen. Electronically Signed   By: Sharlet Salina M.D.   On: 06/22/2020 21:46   CT ABDOMEN PELVIS W CONTRAST  Result Date: 06/22/2020 CLINICAL DATA:  Tachypnea, sacral decubitus ulcer, fever, sepsis EXAM: CT CHEST, ABDOMEN, AND PELVIS WITH CONTRAST TECHNIQUE: Multidetector CT imaging of the chest, abdomen and pelvis was performed following the standard protocol during bolus administration of intravenous contrast. CONTRAST:  OMNIPAQUE IOHEXOL 300 MG/ML  SOLN COMPARISON:  06/22/2020, 09/16/2019, 02/05/2020 FINDINGS: CT CHEST FINDINGS Cardiovascular: The heart and great vessels are unremarkable without pericardial effusion. Normal caliber of the thoracic aorta. Mediastinum/Nodes: No enlarged mediastinal, hilar, or axillary lymph nodes. Thyroid gland, trachea, and esophagus demonstrate no significant findings. Lungs/Pleura: Dependent right lower lobe consolidation could reflect airspace disease or atelectasis. No effusion or pneumothorax. Mucoid material is seen within the right lower lobe bronchus. No effusion or pneumothorax. Musculoskeletal: No acute or destructive bony lesions. Reconstructed images demonstrate no additional findings. CT ABDOMEN PELVIS FINDINGS Hepatobiliary: No focal liver abnormality is seen. No gallstones, gallbladder wall thickening, or biliary dilatation. Pancreas: Unremarkable. No pancreatic ductal dilatation or surrounding inflammatory changes. Spleen: Normal in size without focal abnormality. Adrenals/Urinary Tract: There is a punctate  calcification layering dependently within the bladder. No other urinary tract calculi or signs of obstruction. The kidneys enhance normally and symmetrically. The adrenals are unremarkable. Stomach/Bowel: No bowel obstruction or ileus. No bowel wall thickening or inflammatory change. Vascular/Lymphatic: No significant vascular findings are present. No enlarged abdominal or pelvic lymph nodes. Reproductive: Prostate is unremarkable. Other: No free fluid or free gas. No abdominal wall hernia. Musculoskeletal: Persistent decubitus ulceration overlying the distal sacrum and coccyx. Since the prior exam, erosive changes are seen within the dorsal aspect of the lower sacrum at approximately the S3 and S4 levels. No periosteal reaction. Bilateral L5 pars defects and grade 2 anterolisthesis of L5 on S1 unchanged. Stable chronic compression deformities involving the superior endplates of the L3 and L4 vertebral bodies. Severe spondylosis at L5/S1 unchanged. Reconstructed images demonstrate no additional findings. IMPRESSION: 1. Dependent right lower lobe consolidation with mucoid material in the right lower lobe bronchus. Findings could reflect mucous plugging, aspiration, or infection. 2. Persistent decubitus ulceration overlying the distal sacrum and coccyx. Since the prior exam, erosive changes within the dorsal aspect of the lower sacrum at approximately the S3 and S4 levels, consistent with chronic osteomyelitis. 3. Punctate nonobstructing calculus within the bladder lumen. Electronically Signed   By: Sharlet Salina M.D.   On: 06/22/2020 21:46   DG Chest Port 1 View  Result Date: 06/22/2020 CLINICAL DATA:  Sacral decubitus ulcer, sepsis, fever EXAM: PORTABLE CHEST 1 VIEW COMPARISON:  02/14/2020 FINDINGS: Single frontal view of the chest demonstrates a stable cardiac silhouette. Increased density at the medial right lung base could reflect atelectasis/scarring or airspace disease. There is slight elevation of the  right hemidiaphragm, new since prior study. No effusion or pneumothorax. No acute bony abnormalities. IMPRESSION: 1. Increased density at the medial right lung base, with elevation of the right hemidiaphragm suggesting volume loss and atelectasis or scarring. Superimposed airspace disease cannot be completely excluded in the clinical  scenario. Electronically Signed   By: Sharlet Salina M.D.   On: 06/22/2020 17:32    Procedures .Critical Care Performed by: Terald Sleeper, MD Authorized by: Terald Sleeper, MD   Critical care provider statement:    Critical care time (minutes):  45   Critical care was necessary to treat or prevent imminent or life-threatening deterioration of the following conditions:  Sepsis   Critical care was time spent personally by me on the following activities:  Discussions with consultants, evaluation of patient's response to treatment, examination of patient, ordering and performing treatments and interventions, ordering and review of laboratory studies, ordering and review of radiographic studies, pulse oximetry, re-evaluation of patient's condition, obtaining history from patient or surrogate and review of old charts   (including critical care time)  Medications Ordered in ED Medications  lactated ringers infusion ( Intravenous New Bag/Given 06/23/20 0715)  aspirin EC tablet 81 mg (has no administration in time range)  pravastatin (PRAVACHOL) tablet 40 mg (has no administration in time range)  bisacodyl (DULCOLAX) suppository 10 mg (has no administration in time range)  finasteride (PROSCAR) tablet 5 mg (has no administration in time range)  iron polysaccharides (NIFEREX) capsule 150 mg (has no administration in time range)  vitamin B-12 (CYANOCOBALAMIN) tablet 500 mcg (has no administration in time range)  levETIRAcetam (KEPPRA) tablet 500 mg (500 mg Oral Given 06/23/20 0100)  feeding supplement (PRO-STAT SUGAR FREE 64) liquid 30 mL (has no administration in  time range)  feeding supplement (ENSURE ENLIVE / ENSURE PLUS) liquid 237 mL (has no administration in time range)  nutrition supplement (JUVEN) (JUVEN) powder packet 1 packet (has no administration in time range)  zinc sulfate capsule 220 mg (has no administration in time range)  loratadine (CLARITIN) tablet 10 mg (has no administration in time range)  heparin injection 5,000 Units (5,000 Units Subcutaneous Given 06/23/20 0537)  acetaminophen (TYLENOL) tablet 650 mg (has no administration in time range)    Or  acetaminophen (TYLENOL) suppository 650 mg (has no administration in time range)  ondansetron (ZOFRAN) tablet 4 mg (has no administration in time range)    Or  ondansetron (ZOFRAN) injection 4 mg (has no administration in time range)  sodium phosphate (FLEET) 7-19 GM/118ML enema 1 enema (has no administration in time range)  ceFEPIme (MAXIPIME) 2 g in sodium chloride 0.9 % 100 mL IVPB (2 g Intravenous New Bag/Given 06/23/20 0536)  vancomycin (VANCOCIN) IVPB 1000 mg/200 mL premix (has no administration in time range)  lactated ringers bolus 1,000 mL (0 mLs Intravenous Stopped 06/22/20 2046)    And  lactated ringers bolus 500 mL (0 mLs Intravenous Stopped 06/22/20 1853)  metroNIDAZOLE (FLAGYL) IVPB 500 mg (0 mg Intravenous Stopped 06/22/20 2001)  vancomycin (VANCOCIN) IVPB 1000 mg/200 mL premix (0 mg Intravenous Stopped 06/22/20 2029)  ceFEPIme (MAXIPIME) 2 g in sodium chloride 0.9 % 100 mL IVPB (0 g Intravenous Stopped 06/22/20 1853)  iohexol (OMNIPAQUE) 300 MG/ML solution 100 mL (100 mLs Intravenous Contrast Given 06/22/20 2132)    ED Course  I have reviewed the triage vital signs and the nursing notes.  Pertinent labs & imaging results that were available during my care of the patient were reviewed by me and considered in my medical decision making (see chart for details).  This patient presents with concerns for cough, fever at home .  This involves an extensive number of treatment  options, and is a complaint that carries with it a high risk of complications and morbidity.  The differential diagnosis includes sepsis and/or PNA and/or osteomyelitis and/or other infectious source  With fever, tachycardia, and two likely sources of bacterial infection on arrival (pneumonia and osteomyelitis), I made the patient a sepsis alert and workup.  I ordered, reviewed, and interpreted labs, which included lactate 1.5 -> 1.9, WBC 7.5, Hgb 13.1, Na 152, Cl 114, BUN 41, Cr 0.9.  Covid/Flu tests negative. I ordered medication IV antibiotics and IV fluids for sepsis protocol I ordered imaging studies which included dg Chest, CT chest, abdomen and pelvis  I independently visualized and interpreted imaging which showed right lower lobe consolidation and chronic osteomyelitis,  and the monitor tracing which showed sinus tachycardia Additional history was obtained from patient's sister and PoA at bedside Previous records obtained and reviewed showing prior imaging studies, osteomyelitis management  Hypernatremia is likely dietary as he is on a variety of supplements at home to maintain calories.  Doubt this is causing AMS.  After the interventions stated above, I reevaluated the patient and found pt remained HD stable, now requiring 2L [place] to maintain SpO2 > 90%.   Clinical Course as of Jun 23 1110  Mon Jun 22, 2020  2201 From workup, most likely PNA in RLL.  Pt requiring 2L [place].  Sister updated on workup and plan to admit.     [MT]  2209 Signed out to hospitalist   [MT]    Clinical Course User Index [MT] Raequon Catanzaro, Kermit Balo, MD    Final Clinical Impression(s) / ED Diagnoses Final diagnoses:  Sepsis, due to unspecified organism, unspecified whether acute organ dysfunction present [facility])  Pneumonia of right lower lobe due to infectious organism  Hypernatremia  Diabetic osteomyelitis (HCC)  Pressure injury of sacral region, stage 4 [facility])    Rx / DC Orders ED Discharge Orders    None         Terald Sleeper, MD 06/23/20 1111

## 2020-06-22 NOTE — Telephone Encounter (Signed)
I have spoke with Carly with [facility], advised her when patient is scheduled for picc placement. I have also spoke with Debbie with [facility] where patient will be using pharmacy services, I provided her with patient's appointment and patient will be receiving cefazolin. New orders have been faxed to [facility], [facility] and short stay. Adjoa Althouse T [name]

## 2020-06-22 NOTE — Telephone Encounter (Signed)
Tracy Todd returned call, confirmed appointment details.  One of Tracy Todd's care providers has covid, but was in a hotel for 1 week and then has been isolating in 1 room away from everyone else in the home.  Tracy Todd has had 4 negative covid tests. [name], RN

## 2020-06-22 NOTE — Progress Notes (Signed)
eLink is monitoring this sepsis 

## 2020-06-22 NOTE — ED Notes (Signed)
Patient taken upstairs by tech 

## 2020-06-22 NOTE — Progress Notes (Signed)
A consult was received from an ED physician for Vancomycin, Cefepime per pharmacy dosing.  The patient's profile has been reviewed for ht/wt/allergies/indication/available labs.   No weight this admission, previous weight 47.2 kg on 06/06/20 A one time order has been placed for Vancomycin 1g IV, Cefepime 2g IV.  Further antibiotics/pharmacy consults should be ordered by admitting physician if indicated.                       Thank you,  Lynann Beaver PharmD, BCPS Clinical Pharmacist [facility] main pharmacy [phone] 06/22/2020 5:08 PM

## 2020-06-22 NOTE — H&P (Signed)
TRH H&P    Patient Demographics:    Tracy Todd, is a 54 y.o. male  MRN: [id number]  DOB - [birth year]  Admit Date - 06/22/2020  Referring MD/NP/PA: Langston Masker  Outpatient Primary MD for the patient is Chesley Noon, MD  Patient coming from: Home  Chief complaint-fever and cough   HPI:    Tracy Todd  is a 54 y.o. male,  history of seizures, recurrent syncope, hyperlipidemia, iron deficiency anemia, Down syndrome, and more who presents to the ER with chief complaint of fever and cough.  Patient is nonverbal and was not able to provide any history whatsoever, but chart review reveals the patient was experiencing cough and shortness of breath.  Sister was at bedside for the ED provider and she is the primary caretaker as well as the power of attorney.  She reports that the home health aide head notified her of the fever at home, patient was tachycardic, patient seemed to be breathing faster than normal, and coughing.  There was concern for infection so they advised bring him into the ER.  Further chart review reveals that patient has been bedbound for the last 7 to 8 months with a steady and chronic decline in function.  He is on treatment with 2 antibiotics for his chronic osteomyelitis at home.  He follows with infectious disease.  Home medications show that cefdinir and Flagyl are likely these 2 antibiotics.  Sister had noted that patient is supposed to have a PICC line placement for IV antibiotics.  In the ED Temperature 99.7, heart rate as high as 102, respiratory rate as high as 34, blood pressure ranged from 97/64-180 5/163.  Oxygen sats declined to 89% patient was put on 2 L nasal cannula White blood cell count 7.5, hemoglobin 13.1 CHEM panel was unremarkable There is a mild transaminitis with an AST of 59 and ALT of 45 INR is 1.7 Lactic acid 1.5 and 1.9, will trend in a.m. CT chest abdomen pelvis showed  independent right lower lobe consolidation with mucoid material in the right lower lobe bronchus.  Persistent decubitus ulceration overlying the distal sacrum and coccyx.  Erosive changes since last image within the dorsal aspect of the lower sacrum.  Punctate nonobstructing calculus within the bladder lumen. Patient was started on Vanco and cefepime Admission requested for further management of pneumonia and sepsis  Review of systems:    In addition to the HPI above,  Review of Systems  Unable to perform ROS: Patient nonverbal       Past History of the following :    Past Medical History:  Diagnosis Date   Arthritis    Down's syndrome    Headache    Hyperlipidemia    Iron deficiency anemia    Recurrent syncope    Seizure disorder ([place]) 05/21/2020   Seizures ([place])       History reviewed. No pertinent surgical history.    Social History:      Social History   Tobacco Use   Smoking status: Never Smoker   Smokeless  tobacco: Never Used  Substance Use Topics   Alcohol use: Never       Family History :    No family history on file. Cannot review as patient is nonverbal   Home Medications:   Prior to Admission medications   Medication Sig Start Date End Date Taking? Authorizing Provider  acetaminophen (TYLENOL) 500 MG tablet Take 500 mg by mouth every 6 (six) hours as needed for moderate pain.  04/14/20 04/14/21 Yes [provider]  Amino Acids-Protein Hydrolys (FEEDING SUPPLEMENT, PRO-STAT SUGAR FREE 64,) LIQD Take 30 mLs by mouth 2 (two) times daily. To aid in wound healing; resident prefers Prostat mixed in water. 02/14/20  Yes [provider]  aspirin 81 MG EC tablet Take 1 tablet (81 mg total) by mouth daily. 05/21/20  Yes Kathrynn Ducking, MD  benzocaine (AMERICAINE) 20 % oral spray Apply 1 application topically 3 (three) times daily as needed for throat irritation / pain.  03/18/20 03/18/21 Yes [provider]  bisacodyl  (DULCOLAX) 10 MG suppository If not relieved by MOM, give 10 mg Bisacodyl suppositiory rectally X 1 dose in 24 hours as needed (Do not use constipation standing orders for residents with renal failure/CFR less than 30. Contact MD for orders) (Physician Order)  02/13/20  Yes [provider]  cefdinir (OMNICEF) 300 MG capsule Take 1 capsule (300 mg total) by mouth 2 (two) times daily. 06/12/20  Yes Rosiland Oz, MD  cetirizine (ZYRTEC) 10 MG tablet Take 10 mg by mouth daily as needed for allergies.    Yes [provider]  diclofenac Sodium (VOLTAREN) 1 % GEL Apply 2 g topically 4 (four) times daily. To both knees 03/03/20  Yes Ngetich, Dinah C, NP  Ensure Plus (ENSURE PLUS) LIQD Take 237 mLs by mouth 2 (two) times daily between meals.    Yes [provider]  FIBER ADULT GUMMIES PO Take 1 tablet by mouth in the morning and at bedtime.    Yes [provider]  finasteride (PROSCAR) 5 MG tablet Take 5 mg by mouth daily. 04/30/20  Yes [provider]  hydrocortisone 2.5 % cream Apply 1 application topically 2 (two) times daily as needed (skin rash).  04/14/20 04/14/21 Yes [provider]  iron polysaccharides (NU-IRON) 150 MG capsule Take 1 capsule (150 mg total) by mouth daily. 03/03/20  Yes Ngetich, Dinah C, NP  ketoconazole (NIZORAL) 2 % shampoo Apply 1 application topically See admin instructions. Use to wash hair and face, alternate by week. 03/03/20  Yes Ngetich, Dinah C, NP  levETIRAcetam (KEPPRA) 500 MG tablet Take 1 tablet (500 mg total) by mouth 2 (two) times daily. 03/03/20  Yes Ngetich, Dinah C, NP  magnesium hydroxide (MILK OF MAGNESIA) 400 MG/5ML suspension If no BM in 3 days, give 30 cc Milk of Magnesium p.o. x 1 dose in 24 hours as needed (Do not use standing constipation orders for residents with renal failure CFR less than 30. Contact MD for orders) (Physician Order)  02/13/20  Yes [provider]  Multiple Vitamins-Minerals  (MULTIVITAMIN WITH MINERALS) tablet Take 1 tablet by mouth daily.   Yes [provider]  nutrition supplement, JUVEN, (JUVEN) PACK Take 1 packet by mouth 2 (two) times daily between meals. To aid in wound healing. 02/14/20  Yes [provider]  pravastatin (PRAVACHOL) 40 MG tablet Take 1 tablet (40 mg total) by mouth daily. 03/03/20  Yes Ngetich, Dinah C, NP  SANTYL ointment Apply 1 application topically daily.  04/14/20  Yes [provider]  selenium sulfide (SELSUN) 1 % LOTN Apply 1 application topically daily. 03/03/20  Yes Ngetich, Dinah C, NP  Sodium Phosphates (RA SALINE ENEMA RE) If not relieved by Biscodyl suppository, give disposable Saline Enema rectally X 1 dose/24 hrs as needed (Do not use constipation standing orders for residents with renal failure/CFR less than 30. Contact MD for orders)(Physician Or  02/13/20  Yes [provider]  vitamin B-12 (CYANOCOBALAMIN) 500 MCG tablet Take 1 tablet (500 mcg total) by mouth daily. 03/03/20  Yes Ngetich, Dinah C, NP  zinc gluconate 50 MG tablet Take 1 tablet (50 mg total) by mouth daily. 03/03/20  Yes Ngetich, Dinah C, NP  Disposable Gloves MISC Use gloves for diaper changes.Dispense 2 boxes 03/19/20   [provider]  Incontinence Supply Disposable (COMFORT SHIELD ADULT DIAPERS) MISC Dispense 5 boxes/mo. Use 4-6 times daily for fecal incontinence and diarrhea. 03/19/20   [provider]  Incontinence Supply Disposable (RA ADULT WIPES) MISC Please dispense 4 boxes/wipes per month. 03/19/20   [provider]  Incontinence Supply Disposable (UNDERPADS MEDIUM) MISC Dispense 3 boxes/mo for fecal and urinary incontinence and diarrhea. 03/19/20   [provider]  metroNIDAZOLE (FLAGYL) 500 MG tablet Take 1 tablet (500 mg total) by mouth 3 (three) times daily. 06/10/20   Rosiland Oz, MD  Misc. Devices MISC Take by mouth.  03/18/20   [provider]  Misc. Devices MISC Manual  wheelchair. 16 inch wide with elevating leg rests, high reclining back and neck support and 16-inch cushion. 03/18/20   [provider]  NON FORMULARY Regular liberalized diet d/t Dx Malnutrition.  02/14/20   [provider]  UNABLE TO FIND Med Name: anti dandruff shampoo    [provider]     Allergies:     Allergies  Allergen Reactions   Penicillins     Unknown Did it involve swelling of the face/tongue/throat, SOB, or low BP? Unk Did it involve sudden or severe rash/hives, skin peeling, or any reaction on the inside of your mouth or nose? Unk Did you need to seek medical attention at a hospital or doctor's office? Unk When did it last happen?childhood If all above answers are "NO", may proceed with cephalosporin use.       Physical Exam:   Vitals  Blood pressure 97/73, pulse 78, temperature 99.7 F (37.6 C), temperature source Oral, resp. rate (!) 27, height 5' (1.524 m), weight 40.8 kg, SpO2 100 %.  1.  General: Lying supine in bed in no acute distress  2. Psychiatric: Nonverbal-cannot assess  3. Neurologic: Not following commands-cannot fully assess Opens eyes to voice, extraocular muscles intact, pupils reactive Withdraws upper left extremity to cold touch  4. HEENMT:  Head is atraumatic, pupils reactive, neck is supple, trachea is midline, mucous membranes mildly dry  5. Respiratory : Lungs are clear to auscultation bilaterally, poor effort with deep breaths shows diminished breath sounds in the lower lung field bilaterally  6. Cardiovascular : Heart rate is tachycardic, rhythm is regular, systolic murmur present, no rubs or gallops  7. Gastrointestinal:  Abdomen is soft, nondistended, difficult to tell if patient is tender or if he is pushing my hand away because he is cold  8. Skin:  Unstageable decubitus ulcer over sacrum No other acute lesion on limited skin exam  9.Musculoskeletal:  No peripheral edema, acute  deformity    Data Review:    CBC Recent Labs  Lab 06/22/20 1700  WBC 7.5  HGB 13.1  HCT 45.7  PLT 438*  MCV 105.5*  MCH 30.3  MCHC 28.7*  RDW 17.9*  LYMPHSABS 0.8  MONOABS 0.3  EOSABS 0.0  BASOSABS 0.0   ------------------------------------------------------------------------------------------------------------------  Results for orders placed or performed during the hospital encounter of 06/22/20 (from the past 48 hour(s))  Lactic acid, plasma     Status: None   Collection Time: 06/22/20  5:00 PM  Result Value Ref Range   Lactic Acid, Venous 1.5 0.5 - 1.9 mmol/L    Comment: Performed at [facility], [place] [address] [place], [place] [address]  Comprehensive metabolic panel     Status: Abnormal   Collection Time: 06/22/20  5:00 PM  Result Value Ref Range   Sodium 152 (H) 135 - 145 mmol/L   Potassium 3.5 3.5 - 5.1 mmol/L   Chloride 114 (H) 98 - 111 mmol/L   CO2 26 22 - 32 mmol/L   Glucose, Bld 130 (H) 70 - 99 mg/dL    Comment: Glucose reference range applies only to samples taken after fasting for at least 8 hours.   BUN 41 (H) 6 - 20 mg/dL   Creatinine, Ser 0.90 0.61 - 1.24 mg/dL   Calcium 8.4 (L) 8.9 - 10.3 mg/dL   Total Protein 6.9 6.5 - 8.1 g/dL   Albumin 2.9 (L) 3.5 - 5.0 g/dL   AST 59 (H) 15 - 41 U/L   ALT 45 (H) 0 - 44 U/L   Alkaline Phosphatase 94 38 - 126 U/L   Total Bilirubin 0.7 0.3 - 1.2 mg/dL   GFR, Estimated >60 >60 mL/min    Comment: (NOTE) Calculated using the CKD-EPI Creatinine Equation (2021)    Anion gap 12 5 - 15    Comment: Performed at [facility], [place] [address] [place], [place] [address]  CBC WITH DIFFERENTIAL     Status: Abnormal   Collection Time: 06/22/20  5:00 PM  Result Value Ref Range   WBC 7.5 4.0 - 10.5 K/uL   RBC 4.33 4.22 - 5.81 MIL/uL   Hemoglobin 13.1 13.0 - 17.0 g/dL   HCT 45.7 39 - 52 %   MCV 105.5 (H) 80.0 - 100.0 fL   MCH 30.3 26.0 - 34.0 pg   MCHC 28.7 (L) 30.0 - 36.0 g/dL    RDW 17.9 (H) 11.5 - 15.5 %   Platelets 438 (H) 150 - 400 K/uL   nRBC 0.0 0.0 - 0.2 %   Neutrophils Relative % 84 %   Neutro Abs 6.3 1.7 - 7.7 K/uL   Lymphocytes Relative 11 %   Lymphs Abs 0.8 0.7 - 4.0 K/uL   Monocytes Relative 4 %   Monocytes Absolute 0.3 0.1 - 1.0 K/uL   Eosinophils Relative 0 %   Eosinophils Absolute 0.0 0.0 - 0.5 K/uL   Basophils Relative 1 %   Basophils Absolute 0.0 0.0 - 0.1 K/uL   Immature Granulocytes 0 %   Abs Immature Granulocytes 0.02 0.00 - 0.07 K/uL    Comment: Performed at [facility], [place] [address], [place], [place] [address]  Protime-INR     Status: Abnormal   Collection Time: 06/22/20  5:00 PM  Result Value Ref Range   Prothrombin Time 19.0 (H) 11.4 - 15.2 seconds   INR 1.7 (H) 0.8 - 1.2    Comment: (NOTE) INR goal varies based on device and disease states. Performed at [facility], [place] [address] [place], [place] [address]  APTT     Status: None   Collection Time: 06/22/20  5:00 PM  Result Value Ref Range   aPTT 32 24 - 36 seconds    Comment: Performed at [facility], [place] [address], [place], [place] [address]  Respiratory Panel by RT PCR (Flu A&B, Covid) - Nasopharyngeal Swab     Status: None   Collection Time: 06/22/20  5:55 PM   Specimen: Nasopharyngeal Swab  Result Value Ref Range   SARS Coronavirus 2 by RT PCR NEGATIVE NEGATIVE    Comment: (NOTE) SARS-CoV-2 target nucleic acids are NOT DETECTED.  The SARS-CoV-2 RNA is generally detectable in upper respiratoy specimens during the acute phase of infection. The lowest concentration of SARS-CoV-2 viral copies this assay can detect is 131 copies/mL. A negative result does not preclude SARS-Cov-2 infection and should not be used as the sole basis for treatment or other patient management decisions. A negative result may occur with  improper specimen collection/handling, submission of specimen other than nasopharyngeal swab,  presence of viral mutation(s) within the areas targeted by this assay, and inadequate number of viral copies (<131 copies/mL). A negative result must be combined with clinical observations, patient history, and epidemiological information. The expected result is Negative.  Fact Sheet for Patients:  PinkCheek.be  Fact Sheet for Healthcare Providers:  GravelBags.it  This test is no t yet approved or cleared by the [place] FDA and  has been authorized for detection and/or diagnosis of SARS-CoV-2 by FDA under an Emergency Use Authorization (EUA). This EUA will remain  in effect (meaning this test can be used) for the duration of the COVID-19 declaration under Section 564(b)(1) of the Act, 21 U.S.C. section 360bbb-3(b)(1), unless the authorization is terminated or revoked sooner.     Influenza A by PCR NEGATIVE NEGATIVE   Influenza B by PCR NEGATIVE NEGATIVE    Comment: (NOTE) The Xpert Xpress SARS-CoV-2/FLU/RSV assay is intended as an aid in  the diagnosis of influenza from Nasopharyngeal swab specimens and  should not be used as a sole basis for treatment. Nasal washings and  aspirates are unacceptable for Xpert Xpress SARS-CoV-2/FLU/RSV  testing.  Fact Sheet for Patients: PinkCheek.be  Fact Sheet for Healthcare Providers: GravelBags.it  This test is not yet approved or cleared by the [place] FDA and  has been authorized for detection and/or diagnosis of SARS-CoV-2 by  FDA under an Emergency Use Authorization (EUA). This EUA will remain  in effect (meaning this test can be used) for the duration of the  Covid-19 declaration under Section 564(b)(1) of the Act, 21  U.S.C. section 360bbb-3(b)(1), unless the authorization is  terminated or revoked. Performed at [facility], [place] [address], [place], [place] [address]   Lactic acid,  plasma     Status: None   Collection Time: 06/22/20  6:55 PM  Result Value Ref Range   Lactic Acid, Venous 1.9 0.5 - 1.9 mmol/L    Comment: Performed at [facility], [place] [address] [place], [place] [address]    Chemistries  Recent Labs  Lab 06/22/20 1700  NA 152*  K 3.5  CL 114*  CO2 26  GLUCOSE 130*  BUN 41*  CREATININE 0.90  CALCIUM 8.4*  AST 59*  ALT 45*  ALKPHOS 94  BILITOT 0.7   ------------------------------------------------------------------------------------------------------------------  ------------------------------------------------------------------------------------------------------------------ GFR: Estimated Creatinine Clearance: 53.5 mL/min (by C-G formula based on SCr of 0.9 mg/dL). Liver Function Tests: Recent Labs  Lab 06/22/20 1700  AST 59*  ALT 45*  ALKPHOS 94  BILITOT 0.7  PROT 6.9  ALBUMIN 2.9*   No results for input(s): LIPASE, AMYLASE in the last 168 hours. No results for input(s): AMMONIA in the last 168 hours. Coagulation Profile: Recent Labs  Lab 06/22/20 1700  INR 1.7*   Cardiac Enzymes: No results for input(s): CKTOTAL, CKMB, CKMBINDEX, TROPONINI in the last 168 hours. BNP (last 3 results) No results for input(s): PROBNP in the last 8760 hours. HbA1C: No results for input(s): HGBA1C in the last 72 hours. CBG: No results for input(s): GLUCAP in the last 168 hours. Lipid Profile: No results for input(s): CHOL, HDL, LDLCALC, TRIG, CHOLHDL, LDLDIRECT in the last 72 hours. Thyroid Function Tests: No results for input(s): TSH, T4TOTAL, FREET4, T3FREE, THYROIDAB in the last 72 hours. Anemia Panel: No results for input(s): VITAMINB12, FOLATE, FERRITIN, TIBC, IRON, RETICCTPCT in the last 72 hours.  --------------------------------------------------------------------------------------------------------------- Urine analysis:    Component Value Date/Time   COLORURINE YELLOW 02/12/2020 0027   APPEARANCEUR  CLEAR 02/12/2020 0027   LABSPEC 1.015 02/12/2020 0027   PHURINE 6.0 02/12/2020 0027   GLUCOSEU NEGATIVE 02/12/2020 0027   HGBUR SMALL (A) 02/12/2020 0027   BILIRUBINUR NEGATIVE 02/12/2020 0027   KETONESUR NEGATIVE 02/12/2020 0027   PROTEINUR NEGATIVE 02/12/2020 0027   NITRITE NEGATIVE 02/12/2020 0027   LEUKOCYTESUR NEGATIVE 02/12/2020 0027      Imaging Results:    CT Chest W Contrast  Result Date: 06/22/2020 CLINICAL DATA:  Tachypnea, sacral decubitus ulcer, fever, sepsis EXAM: CT CHEST, ABDOMEN, AND PELVIS WITH CONTRAST TECHNIQUE: Multidetector CT imaging of the chest, abdomen and pelvis was performed following the standard protocol during bolus administration of intravenous contrast. CONTRAST:  131m OMNIPAQUE IOHEXOL 300 MG/ML  SOLN COMPARISON:  06/22/2020, 09/16/2019, 02/05/2020 FINDINGS: CT CHEST FINDINGS Cardiovascular: The heart and great vessels are unremarkable without pericardial effusion. Normal caliber of the thoracic aorta. Mediastinum/Nodes: No enlarged mediastinal, hilar, or axillary lymph nodes. Thyroid gland, trachea, and esophagus demonstrate no significant findings. Lungs/Pleura: Dependent right lower lobe consolidation could reflect airspace disease or atelectasis. No effusion or pneumothorax. Mucoid material is seen within the right lower lobe bronchus. No effusion or pneumothorax. Musculoskeletal: No acute or destructive bony lesions. Reconstructed images demonstrate no additional findings. CT ABDOMEN PELVIS FINDINGS Hepatobiliary: No focal liver abnormality is seen. No gallstones, gallbladder wall thickening, or biliary dilatation. Pancreas: Unremarkable. No pancreatic ductal dilatation or surrounding inflammatory changes. Spleen: Normal in size without focal abnormality. Adrenals/Urinary Tract: There is a punctate calcification layering dependently within the bladder. No other urinary tract calculi or signs of obstruction. The kidneys enhance normally and symmetrically. The  adrenals are unremarkable. Stomach/Bowel: No bowel obstruction or ileus. No bowel wall thickening or inflammatory change. Vascular/Lymphatic: No significant vascular findings are present. No enlarged abdominal or pelvic lymph nodes. Reproductive: Prostate is unremarkable. Other: No free fluid or free gas. No abdominal wall hernia. Musculoskeletal: Persistent decubitus ulceration overlying the distal sacrum and coccyx. Since the prior exam, erosive changes are seen within the dorsal aspect of the lower sacrum at approximately the S3 and S4 levels. No periosteal reaction. Bilateral L5 pars defects and grade 2 anterolisthesis of L5 on S1 unchanged. Stable chronic compression deformities involving the superior endplates of the L3 and L4 vertebral bodies. Severe spondylosis at L5/S1 unchanged. Reconstructed images demonstrate no additional findings. IMPRESSION: 1. Dependent right lower lobe consolidation with mucoid material in the right lower lobe bronchus. Findings could reflect mucous plugging, aspiration, or infection. 2. Persistent decubitus ulceration overlying the distal sacrum  and coccyx. Since the prior exam, erosive changes within the dorsal aspect of the lower sacrum at approximately the S3 and S4 levels, consistent with chronic osteomyelitis. 3. Punctate nonobstructing calculus within the bladder lumen. Electronically Signed   By: Randa Ngo M.D.   On: 06/22/2020 21:46   CT ABDOMEN PELVIS W CONTRAST  Result Date: 06/22/2020 CLINICAL DATA:  Tachypnea, sacral decubitus ulcer, fever, sepsis EXAM: CT CHEST, ABDOMEN, AND PELVIS WITH CONTRAST TECHNIQUE: Multidetector CT imaging of the chest, abdomen and pelvis was performed following the standard protocol during bolus administration of intravenous contrast. CONTRAST:  139m OMNIPAQUE IOHEXOL 300 MG/ML  SOLN COMPARISON:  06/22/2020, 09/16/2019, 02/05/2020 FINDINGS: CT CHEST FINDINGS Cardiovascular: The heart and great vessels are unremarkable without  pericardial effusion. Normal caliber of the thoracic aorta. Mediastinum/Nodes: No enlarged mediastinal, hilar, or axillary lymph nodes. Thyroid gland, trachea, and esophagus demonstrate no significant findings. Lungs/Pleura: Dependent right lower lobe consolidation could reflect airspace disease or atelectasis. No effusion or pneumothorax. Mucoid material is seen within the right lower lobe bronchus. No effusion or pneumothorax. Musculoskeletal: No acute or destructive bony lesions. Reconstructed images demonstrate no additional findings. CT ABDOMEN PELVIS FINDINGS Hepatobiliary: No focal liver abnormality is seen. No gallstones, gallbladder wall thickening, or biliary dilatation. Pancreas: Unremarkable. No pancreatic ductal dilatation or surrounding inflammatory changes. Spleen: Normal in size without focal abnormality. Adrenals/Urinary Tract: There is a punctate calcification layering dependently within the bladder. No other urinary tract calculi or signs of obstruction. The kidneys enhance normally and symmetrically. The adrenals are unremarkable. Stomach/Bowel: No bowel obstruction or ileus. No bowel wall thickening or inflammatory change. Vascular/Lymphatic: No significant vascular findings are present. No enlarged abdominal or pelvic lymph nodes. Reproductive: Prostate is unremarkable. Other: No free fluid or free gas. No abdominal wall hernia. Musculoskeletal: Persistent decubitus ulceration overlying the distal sacrum and coccyx. Since the prior exam, erosive changes are seen within the dorsal aspect of the lower sacrum at approximately the S3 and S4 levels. No periosteal reaction. Bilateral L5 pars defects and grade 2 anterolisthesis of L5 on S1 unchanged. Stable chronic compression deformities involving the superior endplates of the L3 and L4 vertebral bodies. Severe spondylosis at L5/S1 unchanged. Reconstructed images demonstrate no additional findings. IMPRESSION: 1. Dependent right lower lobe  consolidation with mucoid material in the right lower lobe bronchus. Findings could reflect mucous plugging, aspiration, or infection. 2. Persistent decubitus ulceration overlying the distal sacrum and coccyx. Since the prior exam, erosive changes within the dorsal aspect of the lower sacrum at approximately the S3 and S4 levels, consistent with chronic osteomyelitis. 3. Punctate nonobstructing calculus within the bladder lumen. Electronically Signed   By: MRanda NgoM.D.   On: 06/22/2020 21:46   DG Chest Port 1 View  Result Date: 06/22/2020 CLINICAL DATA:  Sacral decubitus ulcer, sepsis, fever EXAM: PORTABLE CHEST 1 VIEW COMPARISON:  02/14/2020 FINDINGS: Single frontal view of the chest demonstrates a stable cardiac silhouette. Increased density at the medial right lung base could reflect atelectasis/scarring or airspace disease. There is slight elevation of the right hemidiaphragm, new since prior study. No effusion or pneumothorax. No acute bony abnormalities. IMPRESSION: 1. Increased density at the medial right lung base, with elevation of the right hemidiaphragm suggesting volume loss and atelectasis or scarring. Superimposed airspace disease cannot be completely excluded in the clinical scenario. Electronically Signed   By: MRanda NgoM.D.   On: 06/22/2020 17:32    My personal review of EKG: Rhythm sinus tach, Rate 96 /min, QTc 422 ,no  Acute ST changes   Assessment & Plan:    Active Problems:   Sepsis ([place])   1. Sepsis secondary to pneumonia 1. Heart rate as high as 102, respiratory rate as high as 34, pneumonia seen on CT, symptomatic with fever and cough 2. Patient has been on long-term outpatient antibiotics 3. Broad-spectrum coverage with cefepime and bank 4. Blood cultures pending 5. Lactic acid is 1.9 and then 1.5 6. Covid and flu negative 7. Continue IV fluids 8. Continue to monitor 2. Community-acquired pneumonia 1. See plan above 3. Acute hypoxic respiratory  failure 1. Oxygen saturations decreased to as low as 89% 2. Likely secondary to above 3. Patient normalized on 2 L nasal cannula 4. Wean off O2 as tolerated 4. Decubitus ulcer 1. Chronic unstageable ulcer 2. Receiving antibiotics outpatient, apparently due for PICC for outpatient IV antibiotics 3. CT shows decubitus ulcer over sacrum and coccyx erosive changes that are chronic with osteomyelitis 4. Continue broad-spectrum antibiotics with cefepime and bank 5. Blood cultures pending 6. Continue to monitor 5. HLD 1. Continue statin 6. Seizure 1. Continue Keppra    DVT Prophylaxis-   Heparin- SCDs   AM Labs Ordered, also please review Full Orders  Family Communication: No family at bedside  code Status: Full code  Admission status: Inpatient :The appropriate admission status for this patient is INPATIENT. Inpatient status is judged to be reasonable and necessary in order to provide the required intensity of service to ensure the patient's safety. The patient's presenting symptoms, physical exam findings, and initial radiographic and laboratory data in the context of their chronic comorbidities is felt to place them at high risk for further clinical deterioration. Furthermore, it is not anticipated that the patient will be medically stable for discharge from the hospital within 2 midnights of admission. The following factors support the admission status of inpatient.     The patient's presenting symptoms include fever and cough The worrisome physical exam findings include SIRS criteria met The initial radiographic and laboratory data are worrisome because of CT shows dependent right lower lobe consolidation with mucoid material concerning for plugging, aspiration, or infection, also decubitus ulcer over sacrum and coccyx with erosive changes consistent with chronic osteomyelitis The chronic co-morbidities include history of seizures, recurrent syncope, hyperlipidemia, iron deficiency  anemia, Down syndrome,       * I certify that at the point of admission it is my clinical judgment that the patient will require inpatient hospital care spanning beyond 2 midnights from the point of admission due to high intensity of service, high risk for further deterioration and high frequency of surveillance required.*  Time spent in minutes : Dora

## 2020-06-22 NOTE — Telephone Encounter (Signed)
I called patient's caregiver to give her date, time and instructions for patient's appointment and had to leave a voicemail for her to call back. Tracy Todd T [name]

## 2020-06-22 NOTE — ED Triage Notes (Signed)
Per ems: pt coming from group home after caregiver noticed pt was "breathing fast and could hear him from the other room" RR-30, 92% room air, HR 110, temp 118f. Nonverbal at baseline. Being treated for pressure ulcer and recently on antibiotics   500 NS given  20 R forearm

## 2020-06-23 ENCOUNTER — Encounter (HOSPITAL_COMMUNITY): Payer: Self-pay | Admitting: Family Medicine

## 2020-06-23 ENCOUNTER — Other Ambulatory Visit: Payer: Self-pay

## 2020-06-23 DIAGNOSIS — A419 Sepsis, unspecified organism: Secondary | ICD-10-CM | POA: Diagnosis not present

## 2020-06-23 DIAGNOSIS — L89304 Pressure ulcer of unspecified buttock, stage 4: Secondary | ICD-10-CM

## 2020-06-23 LAB — COMPREHENSIVE METABOLIC PANEL
ALT: 29 U/L (ref 0–44)
AST: 32 U/L (ref 15–41)
Albumin: 2.2 g/dL — ABNORMAL LOW (ref 3.5–5.0)
Alkaline Phosphatase: 62 U/L (ref 38–126)
Anion gap: 7 (ref 5–15)
BUN: 21 mg/dL — ABNORMAL HIGH (ref 6–20)
CO2: 28 mmol/L (ref 22–32)
Calcium: 7.9 mg/dL — ABNORMAL LOW (ref 8.9–10.3)
Chloride: 110 mmol/L (ref 98–111)
Creatinine, Ser: 0.68 mg/dL (ref 0.61–1.24)
GFR, Estimated: >60 mL/min (ref >60)
Glucose, Bld: 101 mg/dL — ABNORMAL HIGH (ref 70–99)
Potassium: 3.7 mmol/L (ref 3.5–5.1)
Sodium: 145 mmol/L (ref 135–145)
Total Bilirubin: 0.7 mg/dL (ref 0.3–1.2)
Total Protein: 5.1 g/dL — ABNORMAL LOW (ref 6.5–8.1)

## 2020-06-23 LAB — CBC
HCT: 36.9 % — ABNORMAL LOW (ref 39.0–52.0)
Hemoglobin: 10.5 g/dL — ABNORMAL LOW (ref 13.0–17.0)
MCH: 30.4 pg (ref 26.0–34.0)
MCHC: 28.5 g/dL — ABNORMAL LOW (ref 30.0–36.0)
MCV: 107 fL — ABNORMAL HIGH (ref 80.0–100.0)
Platelets: 285 10*3/uL (ref 150–400)
RBC: 3.45 MIL/uL — ABNORMAL LOW (ref 4.22–5.81)
RDW: 17.3 % — ABNORMAL HIGH (ref 11.5–15.5)
WBC: 6.8 10*3/uL (ref 4.0–10.5)
nRBC: 0 % (ref 0.0–0.2)

## 2020-06-23 LAB — CORTISOL-AM, BLOOD: Cortisol - AM: 14.1 ug/dL (ref 6.7–22.6)

## 2020-06-23 LAB — PROCALCITONIN: Procalcitonin: 0.19 ng/mL

## 2020-06-23 LAB — MRSA PCR SCREENING: MRSA by PCR: NEGATIVE

## 2020-06-23 LAB — URINE CULTURE

## 2020-06-23 LAB — PROTIME-INR
INR: 1.7 — ABNORMAL HIGH (ref 0.8–1.2)
Prothrombin Time: 18.9 seconds — ABNORMAL HIGH (ref 11.4–15.2)

## 2020-06-23 LAB — LACTIC ACID, PLASMA: Lactic Acid, Venous: 2.1 mmol/L (ref 0.5–1.9)

## 2020-06-23 MED ORDER — VITAMIN B-12 1000 MCG PO TABS
500.0000 ug | ORAL_TABLET | Freq: Every day | ORAL | Status: DC
  Administered 2020-06-23 – 2020-07-20 (×26): 500 ug via ORAL
  Filled 2020-06-23 (×24): qty 1
  Filled 2020-06-23: qty 2
  Filled 2020-06-23: qty 1

## 2020-06-23 MED ORDER — ZINC SULFATE 220 (50 ZN) MG PO CAPS
220.0000 mg | ORAL_CAPSULE | Freq: Every day | ORAL | Status: DC
  Administered 2020-06-23 – 2020-07-20 (×26): 220 mg via ORAL
  Filled 2020-06-23 (×28): qty 1

## 2020-06-23 MED ORDER — LORATADINE 10 MG PO TABS
10.0000 mg | ORAL_TABLET | Freq: Every day | ORAL | Status: DC
  Administered 2020-06-23 – 2020-07-18 (×22): 10 mg via ORAL
  Filled 2020-06-23 (×25): qty 1

## 2020-06-23 MED ORDER — LEVETIRACETAM 500 MG PO TABS
500.0000 mg | ORAL_TABLET | Freq: Two times a day (BID) | ORAL | Status: DC
  Administered 2020-06-23 – 2020-06-30 (×16): 500 mg via ORAL
  Filled 2020-06-23 (×18): qty 1

## 2020-06-23 MED ORDER — POLYSACCHARIDE IRON COMPLEX 150 MG PO CAPS
150.0000 mg | ORAL_CAPSULE | Freq: Every day | ORAL | Status: DC
  Administered 2020-06-23 – 2020-07-20 (×27): 150 mg via ORAL
  Filled 2020-06-23 (×28): qty 1

## 2020-06-23 MED ORDER — ACETAMINOPHEN 650 MG RE SUPP
650.0000 mg | Freq: Four times a day (QID) | RECTAL | Status: DC | PRN
  Administered 2020-06-30: 650 mg via RECTAL
  Filled 2020-06-23: qty 1

## 2020-06-23 MED ORDER — ONDANSETRON HCL 4 MG PO TABS: 4.0000 mg | ORAL_TABLET | Freq: Four times a day (QID) | ORAL | Status: DC | PRN

## 2020-06-23 MED ORDER — ASPIRIN EC 81 MG PO TBEC
81.0000 mg | DELAYED_RELEASE_TABLET | Freq: Every day | ORAL | Status: DC
  Administered 2020-06-23 – 2020-06-30 (×8): 81 mg via ORAL
  Filled 2020-06-23 (×10): qty 1

## 2020-06-23 MED ORDER — SODIUM CHLORIDE 0.9 % IV SOLN
2.0000 g | Freq: Two times a day (BID) | INTRAVENOUS | Status: DC
  Administered 2020-06-23 – 2020-06-24 (×3): 2 g via INTRAVENOUS
  Filled 2020-06-23 (×3): qty 2

## 2020-06-23 MED ORDER — PRAVASTATIN SODIUM 20 MG PO TABS
40.0000 mg | ORAL_TABLET | Freq: Every day | ORAL | Status: DC
  Administered 2020-06-23 – 2020-07-20 (×26): 40 mg via ORAL
  Filled 2020-06-23 (×9): qty 2
  Filled 2020-06-23: qty 1
  Filled 2020-06-23 (×12): qty 2
  Filled 2020-06-23: qty 1
  Filled 2020-06-23 (×4): qty 2

## 2020-06-23 MED ORDER — ONDANSETRON HCL 4 MG/2ML IJ SOLN: 4.0000 mg | Freq: Four times a day (QID) | INTRAMUSCULAR | Status: DC | PRN

## 2020-06-23 MED ORDER — PRO-STAT SUGAR FREE PO LIQD
30.0000 mL | Freq: Two times a day (BID) | ORAL | Status: DC
  Administered 2020-06-23 – 2020-07-20 (×53): 30 mL via ORAL
  Filled 2020-06-23 (×55): qty 30

## 2020-06-23 MED ORDER — ENSURE ENLIVE PO LIQD
237.0000 mL | Freq: Two times a day (BID) | ORAL | Status: DC
  Administered 2020-06-23 – 2020-07-20 (×46): 237 mL via ORAL

## 2020-06-23 MED ORDER — VANCOMYCIN HCL IN DEXTROSE 1-5 GM/200ML-% IV SOLN
1000.0000 mg | INTRAVENOUS | Status: DC
  Administered 2020-06-23: 1000 mg via INTRAVENOUS
  Filled 2020-06-23: qty 200

## 2020-06-23 MED ORDER — HEPARIN SODIUM (PORCINE) 5000 UNIT/ML IJ SOLN
5000.0000 [IU] | Freq: Three times a day (TID) | INTRAMUSCULAR | Status: DC
  Administered 2020-06-23 – 2020-07-20 (×83): 5000 [IU] via SUBCUTANEOUS
  Filled 2020-06-23 (×81): qty 1

## 2020-06-23 MED ORDER — JUVEN PO PACK
1.0000 | PACK | Freq: Two times a day (BID) | ORAL | Status: DC
  Administered 2020-06-23 – 2020-07-20 (×53): 1 via ORAL
  Filled 2020-06-23 (×55): qty 1

## 2020-06-23 MED ORDER — BISACODYL 10 MG RE SUPP: 10.0000 mg | Freq: Every day | RECTAL | Status: DC | PRN

## 2020-06-23 MED ORDER — FLEET ENEMA 7-19 GM/118ML RE ENEM: 1.0000 | ENEMA | Freq: Once | RECTAL | Status: DC | PRN

## 2020-06-23 MED ORDER — FINASTERIDE 5 MG PO TABS
5.0000 mg | ORAL_TABLET | Freq: Every day | ORAL | Status: DC
  Administered 2020-06-23 – 2020-07-20 (×27): 5 mg via ORAL
  Filled 2020-06-23 (×27): qty 1

## 2020-06-23 MED ORDER — ACETAMINOPHEN 325 MG PO TABS
650.0000 mg | ORAL_TABLET | Freq: Four times a day (QID) | ORAL | Status: DC | PRN
  Administered 2020-06-24 – 2020-07-18 (×10): 650 mg via ORAL
  Filled 2020-06-23 (×10): qty 2

## 2020-06-23 NOTE — Evaluation (Signed)
Clinical/Bedside Swallow Evaluation Patient Details  Name: Tracy Todd MRN: [id number] Date of Birth: 08-11-1966  Today's Date: 06/23/2020 Time: SLP Start Time (ACUTE ONLY): 1425 SLP Stop Time (ACUTE ONLY): 1500 SLP Time Calculation (min) (ACUTE ONLY): 35 min  Past Medical History:  Past Medical History:  Diagnosis Date  . Arthritis   . Down's syndrome   . Headache   . Hyperlipidemia   . Iron deficiency anemia   . Recurrent syncope   . Seizure disorder (HCC) 05/21/2020  . Seizures (HCC)    Past Surgical History: History reviewed. No pertinent surgical history. HPI:  54yo male admitted 06/22/20 with fever and cough. PMH: seizures, recurrent cyncope, HLD, iron deficiency anemia, Down Syndrome, nonverbal, bedbound last 7-8 months with steady and chronic decline in function.   Assessment / Plan / Recommendation Clinical Impression  Pt was seen for evaluation of swallow function and safety. Sister was present, and reports decline in function, weight, PO tolerance over the past 7-9 months. Pt is unable to follow commands, and is nonvocal. He would not allow visualization of his oral cavity. Pt accepted trials of thin liquid via straw. Audible swallow noted, but no overt s/s aspiration. Bites of puree appeared to be tolerated well. Extended oral prep of graham cracker observed. Cough noted several minutes after PO presentations had ended. Given pt current status and decline over the past several months, recommend a MBS to objectively assess swallow function and safety. Pt is currently lethargic, having spent most of the night in the ED. Will schedule MBS for tomorrow at 1200 so that his sisted may accompany him.   SLP Visit Diagnosis: Dysphagia, unspecified (R13.10)    Aspiration Risk  Moderate aspiration risk;Risk for inadequate nutrition/hydration    Diet Recommendation Regular;Thin liquid   Liquid Administration via: Straw Medication Administration: Whole meds with puree Supervision:  Staff to assist with self feeding;Full supervision/cueing for compensatory strategies Compensations: Minimize environmental distractions;Slow rate;Small sips/bites Postural Changes: Seated upright at 90 degrees;Remain upright for at least 30 minutes after po intake    Other  Recommendations Oral Care Recommendations: Oral care BID   Follow up Recommendations 24 hour supervision/assistance      Frequency and Duration min 1 x/week  1 week;2 weeks       Prognosis Prognosis for Safe Diet Advancement: Fair Barriers to Reach Goals: Cognitive deficits      Swallow Study   General Date of Onset: 06/22/20 HPI: 54yo male admitted 06/22/20 with fever and cough. PMH: seizures, recurrent cyncope, HLD, iron deficiency anemia, Down Syndrome, nonverbal, bedbound last 7-8 months with steady and chronic decline in function. Type of Study: Bedside Swallow Evaluation Previous Swallow Assessment: none found Diet Prior to this Study: Regular;Thin liquids Temperature Spikes Noted: No Respiratory Status: Nasal cannula History of Recent Intubation: No Behavior/Cognition: Lethargic/Drowsy;Doesn't follow directions;Requires cueing Oral Cavity Assessment: Other (comment) (difficult to visualize) Oral Care Completed by SLP: No Oral Cavity - Dentition: Adequate natural dentition Self-Feeding Abilities: Total assist Patient Positioning: Upright in bed Baseline Vocal Quality: Not observed Volitional Cough: Cognitively unable to elicit Volitional Swallow: Unable to elicit    Oral/Motor/Sensory Function Overall Oral Motor/Sensory Function:  (unable to assess)      Thin Liquid Thin Liquid: Impaired Presentation: Straw Pharyngeal  Phase Impairments: Cough - Delayed;Other (comments) (audible swallow)    Puree Puree: Within functional limits Presentation: Spoon   Solid     Solid: Impaired Oral Phase Impairments: Impaired mastication Oral Phase Functional Implications: Prolonged oral transit     [address]  Jodene Nam, MSP, CCC-SLP Speech Language Pathologist Office: [phone] Pager: [phone]  Leigh Aurora 06/23/2020,3:09 PM

## 2020-06-23 NOTE — Progress Notes (Signed)
Provider Hobbs on-call made aware of lactic acid of 2.1. No new orders given.

## 2020-06-23 NOTE — Progress Notes (Signed)
TOC CM received call from [facility] rep, Eber Jones, will provide [facility]. Pt may need IV abx. States [employer] rep, Jeri Modena RN, IV Infusion Coordinator is following. Updated TOC CM assigned to this patient. Isidoro Donning RN CCM, [facility] ED TOC CM [phone]

## 2020-06-23 NOTE — Consult Note (Signed)
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 Periwound epibole and moist smooth wound bed, nongranulating Sacrum: 8cm x 6cm x 0.2cm with moist, smooth, nongranulating wound bed, epibole. Wound bed:As noted above Drainage (amount, consistency, odor) moderate amount, serous Periwound: As noted above Dressing procedure/placement/frequency: I will provide Nursing with guidance for management of these chronic, nonhealing wounds using a silver hydrofiber (Aquacel Advantage) which will confine exudate to wound and allow for vertical wicking, protecting the periwound tissue. A mattress replacement with low air loss feature is provided as patient has just one intact turning surface, the right hip. Heels are intact and will be floated, guidance for product use to prevent from skin damage related to fecal incontinence is provided.   WOC nursing team will not follow, but will remain available to this patient, the nursing and medical teams.  Please re-consult if needed. Thanks, Ladona Mow, MSN, RN, GNP, [facility] [place]  Pager# [phone]

## 2020-06-23 NOTE — Progress Notes (Signed)
Pharmacy Antibiotic Note  Tracy Todd is a 54 y.o. male admitted on 06/22/2020 with history of seizures, recurrent syncope, hyperlipidemia, iron deficiency anemia, Down syndrome, and more who presents to the ER with chief complaint of fever and cough. Pharmacy has been consulted for vanc and cefepime dosing for sepsis.  Pt received vanc 1gm x 1 and cefepime 2gm x 1 in ED  Plan: Vancomycin 1gm IV q24h Cefepime 2gm IV q12h Follow renal function, cultures and clinical course  Height: 5' (152.4 cm) Weight: 40.8 kg (90 lb) IBW/kg (Calculated) : 50  Temp (24hrs), Avg:98.8 F (37.1 C), Min:97.9 F (36.6 C), Max:99.7 F (37.6 C)  Recent Labs  Lab 06/22/20 1700 06/22/20 1855  WBC 7.5  --   CREATININE 0.90  --   LATICACIDVEN 1.5 1.9    Estimated Creatinine Clearance: 53.5 mL/min (by C-G formula based on SCr of 0.9 mg/dL).    Allergies  Allergen Reactions  . Penicillins     Unknown Did it involve swelling of the face/tongue/throat, SOB, or low BP? Unk Did it involve sudden or severe rash/hives, skin peeling, or any reaction on the inside of your mouth or nose? Unk Did you need to seek medical attention at a hospital or doctor's office? Unk When did it last happen?childhood If all above answers are "NO", may proceed with cephalosporin use.      Antimicrobials this admission: 11/1 vanc >> 11/1 cefepime >>  Dose adjustments this admission:   Microbiology results: 11/1 BCx:  11/1 UCx:    Thank you for allowing pharmacy to be a part of this patient's care.  Arley Phenix RPh 06/23/2020, 3:28 AM

## 2020-06-23 NOTE — Progress Notes (Signed)
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  Patient was followed by infectious disease clinic and supposed to have a PICC line placement and 6 weeks of IV antibiotics.  Currently on oral antibiotics with cefdinir and Flagyl. In the emergency room, temperature 99.7.  Heart rate 102.  Blood pressures fairly normal.  89% on room air. Lactic acid 1.5.  CT scan of the chest abdomen pelvis showed right lower lobe consolidation with mucoid material on the right lower lobe bronchus.  Decubitus ulceration, erosive changes of the sacrum.  Patient was treated as sepsis and admitted to the hospital.  Assessment & Plan:   Active Problems:   Pressure injury of skin   Sepsis (HCC)  Sepsis secondary to right lower lobe pneumonia with hypoxia: Suspect aspiration.  Suspect both gram-positive and gram-negative bacteremia. On cefdinir and Flagyl as outpatient.  Blood cultures were drawn and treated with cefepime and vancomycin that we will continue until final cultures available. Oxygen to keep saturations more than 90%. Patient is unable to follow any commands or instructions to do chest physiotherapy.   We will consult speech therapy.  Chronic decubitus ulcer/sacral osteomyelitis: Present on admission.  Stage IV sacral decubitus. ID clinic plan for PICC line under sedation and 6 weeks of IV antibiotics.  Will request PICC line with sedation after negative blood cultures for 48 hours.  Until then we will continue cefepime and vancomycin.  Likely can be discharged on outpatient plan antibiotic with PICC  line that will cover all infections. Wound care consultation.  Hyperlipidemia: On a statin.  Seizure disorder: Stable on Keppra.  Down syndrome/bedbound status/contractures and incontinence: Lives at home with family.  Currently full code.  Will consult palliative care to discuss/counsel and advise patient and family.  Hypernatremia: Due to free water deficit.  Treated with isotonic fluid with improvement.  We will continue to monitor.   DVT prophylaxis: heparin injection 5,000 Units Start: 06/23/20 0115 SCDs Start: 06/23/20 0020   Code Status: Full code Family Communication: Patient sister Ms. Misty Stanley on phone Disposition Plan: Status is: Inpatient  Remains inpatient appropriate because:IV treatments appropriate due to intensity of illness or inability to take PO and Inpatient level of care appropriate due to severity of illness   Dispo: The patient is from: Home              Anticipated d/c is to: Home              Anticipated d/c date is: 3 days              Patient currently is not medically stable to d/c.         Consultants:   None  Procedures:   None  Antimicrobials:  Antibiotics Given (last 72 hours)    Date/Time Action Medication Dose Rate   06/22/20 1826 New Bag/Given   ceFEPIme (MAXIPIME) 2 g in sodium chloride 0.9 % 100 mL IVPB 2 g 200 mL/hr   06/22/20 1901 New Bag/Given   metroNIDAZOLE (FLAGYL) IVPB 500 mg 500 mg 100 mL/hr   06/22/20 1929 New Bag/Given   vancomycin (VANCOCIN) IVPB 1000 mg/200 mL premix 1,000 mg  200 mL/hr   06/23/20 0536 New Bag/Given   ceFEPIme (MAXIPIME) 2 g in sodium chloride 0.9 % 100 mL IVPB 2 g 200 mL/hr         Subjective: Patient seen and examined.  Poor historian.  Unable to participate in history. Overnight afebrile. He resist on examination.  Otherwise looks comfortable.  On 1 to 2 L of oxygen.  Objective: Vitals:   06/22/20 2300 06/22/20 2358 06/23/20 0505 06/23/20 0744  BP: 97/73 93/72 99/64  (!) 164/98  Pulse:  78 79 70 77  Resp: (!) 27 18 17 14   Temp:  97.9 F (36.6 C)  98 F (36.7 C)  TempSrc:  Oral  Oral  SpO2: 100% 97% 100% 99%  Weight:      Height:        Intake/Output Summary (Last 24 hours) at 06/23/2020 1209 Last data filed at 06/23/2020 0930 Gross per 24 hour  Intake 1864 ml  Output 350 ml  Net 1514 ml   Filed Weights   06/22/20 1713  Weight: 40.8 kg    Examination:  General exam: Appears chronically sick looking, calm and comfortable on 1 L oxygen. Respiratory system: Clear to auscultation. Respiratory effort normal.  Mostly conducted airway sounds. Cardiovascular system: S1 & S2 heard, tachycardic.   Gastrointestinal system: Abdomen is nondistended, soft and nontender.  Bowel sounds present.   Central nervous system: Alert, nonverbal.  Resist  Examination. Contracted lower extremities with flexion contractures. Multiple stage IV sacral decubitus ulcer with bone exposed and osteomyelitis.     Data Reviewed: I have personally reviewed following labs and imaging studies  CBC: Recent Labs  Lab 06/22/20 1700 06/23/20 0511  WBC 7.5 6.8  NEUTROABS 6.3  --   HGB 13.1 10.5*  HCT 45.7 36.9*  MCV 105.5* 107.0*  PLT 438* 285   Basic Metabolic Panel: Recent Labs  Lab 06/22/20 1700 06/23/20 0511  NA 152* 145  K 3.5 3.7  CL 114* 110  CO2 26 28  GLUCOSE 130* 101*  BUN 41* 21*  CREATININE 0.90 0.68  CALCIUM 8.4* 7.9*   GFR: Estimated Creatinine Clearance: 60.2 mL/min (by C-G formula based on SCr of 0.68 mg/dL). Liver Function Tests: Recent Labs  Lab 06/22/20 1700 06/23/20 0511  AST 59* 32  ALT 45* 29  ALKPHOS 94 62  BILITOT 0.7 0.7  PROT 6.9 5.1*  ALBUMIN 2.9* 2.2*   No results for input(s): LIPASE, AMYLASE in the last 168 hours. No results for input(s): AMMONIA in the last 168 hours. Coagulation Profile: Recent Labs  Lab 06/22/20 1700 06/23/20 0511  INR 1.7* 1.7*   Cardiac Enzymes: No results for input(s): CKTOTAL, CKMB, CKMBINDEX, TROPONINI  in the last 168 hours. BNP (last 3 results) No results for input(s): PROBNP in the last 8760 hours. HbA1C: No results for input(s): HGBA1C in the last 72 hours. CBG: No results for input(s): GLUCAP in the last 168 hours. Lipid Profile: No results for input(s): CHOL, HDL, LDLCALC, TRIG, CHOLHDL, LDLDIRECT in the last 72 hours. Thyroid Function Tests: No results for input(s): TSH, T4TOTAL, FREET4, T3FREE, THYROIDAB in the last 72 hours. Anemia Panel: No results for input(s): VITAMINB12, FOLATE, FERRITIN, TIBC, IRON, RETICCTPCT in the last 72 hours. Sepsis Labs: Recent Labs  Lab 06/22/20 1700 06/22/20 1855 06/23/20 0511  PROCALCITON  --   --  0.19  LATICACIDVEN 1.5 1.9 2.1*    Recent Results (from the past 240 hour(s))  Blood Culture (routine x 2)     Status: None (Preliminary  result)   Collection Time: 06/22/20  5:55 PM   Specimen: BLOOD  Result Value Ref Range Status   Specimen Description   Final    BLOOD RIGHT ANTECUBITAL Performed at [facility], [address] [place], [place] [address]    Special Requests   Final    BOTTLES DRAWN AEROBIC AND ANAEROBIC Blood Culture results may not be optimal due to an inadequate volume of blood received in culture bottles Performed at [facility], [address], [place], [place] [address]    Culture   Final    NO GROWTH < 12 HOURS Performed at [facility], [address] [place], [place] [address]    Report Status PENDING  Incomplete  Respiratory Panel by RT PCR (Flu A&B, Covid) - Nasopharyngeal Swab     Status: None   Collection Time: 06/22/20  5:55 PM   Specimen: Nasopharyngeal Swab  Result Value Ref Range Status   SARS Coronavirus 2 by RT PCR NEGATIVE NEGATIVE Final    Comment: (NOTE) SARS-CoV-2 target nucleic acids are NOT DETECTED.  The SARS-CoV-2 RNA is generally detectable in upper respiratoy specimens during the acute phase of infection. The lowest concentration of  SARS-CoV-2 viral copies this assay can detect is 131 copies/mL. A negative result does not preclude SARS-Cov-2 infection and should not be used as the sole basis for treatment or other patient management decisions. A negative result may occur with  improper specimen collection/handling, submission of specimen other than nasopharyngeal swab, presence of viral mutation(s) within the areas targeted by this assay, and inadequate number of viral copies (<131 copies/mL). A negative result must be combined with clinical observations, patient history, and epidemiological information. The expected result is Negative.  Fact Sheet for Patients:  https://www.moore.com/  Fact Sheet for Healthcare Providers:  https://www.young.biz/  This test is no t yet approved or cleared by the [place] FDA and  has been authorized for detection and/or diagnosis of SARS-CoV-2 by FDA under an Emergency Use Authorization (EUA). This EUA will remain  in effect (meaning this test can be used) for the duration of the COVID-19 declaration under Section 564(b)(1) of the Act, 21 U.S.C. section 360bbb-3(b)(1), unless the authorization is terminated or revoked sooner.     Influenza A by PCR NEGATIVE NEGATIVE Final   Influenza B by PCR NEGATIVE NEGATIVE Final    Comment: (NOTE) The Xpert Xpress SARS-CoV-2/FLU/RSV assay is intended as an aid in  the diagnosis of influenza from Nasopharyngeal swab specimens and  should not be used as a sole basis for treatment. Nasal washings and  aspirates are unacceptable for Xpert Xpress SARS-CoV-2/FLU/RSV  testing.  Fact Sheet for Patients: https://www.moore.com/  Fact Sheet for Healthcare Providers: https://www.young.biz/  This test is not yet approved or cleared by the [place] FDA and  has been authorized for detection and/or diagnosis of SARS-CoV-2 by  FDA under an Emergency Use  Authorization (EUA). This EUA will remain  in effect (meaning this test can be used) for the duration of the  Covid-19 declaration under Section 564(b)(1) of the Act, 21  U.S.C. section 360bbb-3(b)(1), unless the authorization is  terminated or revoked. Performed at [facility], [address], [place], [place] [address]   Blood Culture (routine x 2)     Status: None (Preliminary result)   Collection Time: 06/22/20  6:00 PM   Specimen: BLOOD  Result Value Ref Range Status   Specimen Description   Final  BLOOD LEFT ANTECUBITAL Performed at [facility], [address], [place], [place] [address]    Special Requests   Final    BOTTLES DRAWN AEROBIC AND ANAEROBIC Blood Culture adequate volume Performed at [facility], [address], [place], [place] [address]    Culture   Final    NO GROWTH < 12 HOURS Performed at [facility], [address] [place], [place] [address]    Report Status PENDING  Incomplete         Radiology Studies: CT Chest W Contrast  Result Date: 06/22/2020 CLINICAL DATA:  Tachypnea, sacral decubitus ulcer, fever, sepsis EXAM: CT CHEST, ABDOMEN, AND PELVIS WITH CONTRAST TECHNIQUE: Multidetector CT imaging of the chest, abdomen and pelvis was performed following the standard protocol during bolus administration of intravenous contrast. CONTRAST:  OMNIPAQUE IOHEXOL 300 MG/ML  SOLN COMPARISON:  06/22/2020, 09/16/2019, 02/05/2020 FINDINGS: CT CHEST FINDINGS Cardiovascular: The heart and great vessels are unremarkable without pericardial effusion. Normal caliber of the thoracic aorta. Mediastinum/Nodes: No enlarged mediastinal, hilar, or axillary lymph nodes. Thyroid gland, trachea, and esophagus demonstrate no significant findings. Lungs/Pleura: Dependent right lower lobe consolidation could reflect airspace disease or atelectasis. No effusion or pneumothorax. Mucoid material is seen within the  right lower lobe bronchus. No effusion or pneumothorax. Musculoskeletal: No acute or destructive bony lesions. Reconstructed images demonstrate no additional findings. CT ABDOMEN PELVIS FINDINGS Hepatobiliary: No focal liver abnormality is seen. No gallstones, gallbladder wall thickening, or biliary dilatation. Pancreas: Unremarkable. No pancreatic ductal dilatation or surrounding inflammatory changes. Spleen: Normal in size without focal abnormality. Adrenals/Urinary Tract: There is a punctate calcification layering dependently within the bladder. No other urinary tract calculi or signs of obstruction. The kidneys enhance normally and symmetrically. The adrenals are unremarkable. Stomach/Bowel: No bowel obstruction or ileus. No bowel wall thickening or inflammatory change. Vascular/Lymphatic: No significant vascular findings are present. No enlarged abdominal or pelvic lymph nodes. Reproductive: Prostate is unremarkable. Other: No free fluid or free gas. No abdominal wall hernia. Musculoskeletal: Persistent decubitus ulceration overlying the distal sacrum and coccyx. Since the prior exam, erosive changes are seen within the dorsal aspect of the lower sacrum at approximately the S3 and S4 levels. No periosteal reaction. Bilateral L5 pars defects and grade 2 anterolisthesis of L5 on S1 unchanged. Stable chronic compression deformities involving the superior endplates of the L3 and L4 vertebral bodies. Severe spondylosis at L5/S1 unchanged. Reconstructed images demonstrate no additional findings. IMPRESSION: 1. Dependent right lower lobe consolidation with mucoid material in the right lower lobe bronchus. Findings could reflect mucous plugging, aspiration, or infection. 2. Persistent decubitus ulceration overlying the distal sacrum and coccyx. Since the prior exam, erosive changes within the dorsal aspect of the lower sacrum at approximately the S3 and S4 levels, consistent with chronic osteomyelitis. 3. Punctate  nonobstructing calculus within the bladder lumen. Electronically Signed   By: Sharlet Salina M.D.   On: 06/22/2020 21:46   CT ABDOMEN PELVIS W CONTRAST  Result Date: 06/22/2020 CLINICAL DATA:  Tachypnea, sacral decubitus ulcer, fever, sepsis EXAM: CT CHEST, ABDOMEN, AND PELVIS WITH CONTRAST TECHNIQUE: Multidetector CT imaging of the chest, abdomen and pelvis was performed following the standard protocol during bolus administration of intravenous contrast. CONTRAST:  OMNIPAQUE IOHEXOL 300 MG/ML  SOLN COMPARISON:  06/22/2020, 09/16/2019, 02/05/2020 FINDINGS: CT CHEST FINDINGS Cardiovascular: The heart and great vessels are unremarkable without pericardial effusion. Normal caliber of the thoracic aorta. Mediastinum/Nodes: No enlarged mediastinal, hilar, or axillary lymph nodes. Thyroid  gland, trachea, and esophagus demonstrate no significant findings. Lungs/Pleura: Dependent right lower lobe consolidation could reflect airspace disease or atelectasis. No effusion or pneumothorax. Mucoid material is seen within the right lower lobe bronchus. No effusion or pneumothorax. Musculoskeletal: No acute or destructive bony lesions. Reconstructed images demonstrate no additional findings. CT ABDOMEN PELVIS FINDINGS Hepatobiliary: No focal liver abnormality is seen. No gallstones, gallbladder wall thickening, or biliary dilatation. Pancreas: Unremarkable. No pancreatic ductal dilatation or surrounding inflammatory changes. Spleen: Normal in size without focal abnormality. Adrenals/Urinary Tract: There is a punctate calcification layering dependently within the bladder. No other urinary tract calculi or signs of obstruction. The kidneys enhance normally and symmetrically. The adrenals are unremarkable. Stomach/Bowel: No bowel obstruction or ileus. No bowel wall thickening or inflammatory change. Vascular/Lymphatic: No significant vascular findings are present. No enlarged abdominal or pelvic lymph nodes. Reproductive:  Prostate is unremarkable. Other: No free fluid or free gas. No abdominal wall hernia. Musculoskeletal: Persistent decubitus ulceration overlying the distal sacrum and coccyx. Since the prior exam, erosive changes are seen within the dorsal aspect of the lower sacrum at approximately the S3 and S4 levels. No periosteal reaction. Bilateral L5 pars defects and grade 2 anterolisthesis of L5 on S1 unchanged. Stable chronic compression deformities involving the superior endplates of the L3 and L4 vertebral bodies. Severe spondylosis at L5/S1 unchanged. Reconstructed images demonstrate no additional findings. IMPRESSION: 1. Dependent right lower lobe consolidation with mucoid material in the right lower lobe bronchus. Findings could reflect mucous plugging, aspiration, or infection. 2. Persistent decubitus ulceration overlying the distal sacrum and coccyx. Since the prior exam, erosive changes within the dorsal aspect of the lower sacrum at approximately the S3 and S4 levels, consistent with chronic osteomyelitis. 3. Punctate nonobstructing calculus within the bladder lumen. Electronically Signed   By: Sharlet Salina M.D.   On: 06/22/2020 21:46   DG Chest Port 1 View  Result Date: 06/22/2020 CLINICAL DATA:  Sacral decubitus ulcer, sepsis, fever EXAM: PORTABLE CHEST 1 VIEW COMPARISON:  02/14/2020 FINDINGS: Single frontal view of the chest demonstrates a stable cardiac silhouette. Increased density at the medial right lung base could reflect atelectasis/scarring or airspace disease. There is slight elevation of the right hemidiaphragm, new since prior study. No effusion or pneumothorax. No acute bony abnormalities. IMPRESSION: 1. Increased density at the medial right lung base, with elevation of the right hemidiaphragm suggesting volume loss and atelectasis or scarring. Superimposed airspace disease cannot be completely excluded in the clinical scenario. Electronically Signed   By: Sharlet Salina M.D.   On: 06/22/2020  17:32        Scheduled Meds: . aspirin EC  81 mg Oral Daily  . feeding supplement  237 mL Oral BID BM  . feeding supplement (PRO-STAT SUGAR FREE 64)  30 mL Oral BID  . finasteride  5 mg Oral Daily  . heparin  5,000 Units Subcutaneous Q8H  . iron polysaccharides  150 mg Oral Daily  . levETIRAcetam  500 mg Oral BID  . loratadine  10 mg Oral Daily  . nutrition supplement (JUVEN)  1 packet Oral BID BM  . pravastatin  40 mg Oral Daily  . vitamin B-12  500 mcg Oral Daily  . zinc sulfate  220 mg Oral Daily   Continuous Infusions: . ceFEPime (MAXIPIME) IV 2 g (06/23/20 0536)  . lactated ringers 150 mL/hr at 06/23/20 0715  . vancomycin       LOS: 1 day    Time spent: 35 minutes  Barb Merino, MD [facility] Pager [phone]

## 2020-06-23 NOTE — Progress Notes (Signed)
Initial Nutrition Assessment  DOCUMENTATION CODES:   Underweight  INTERVENTION:   -Ensure Enlive po BID, each supplement provides 350 kcal and 20 grams of protein -Prostat BID ordered. Can replace with Prosource Plus PO BID, each provides 100 kcals and 15g protein -Juven Fruit Punch BID, each serving provides 95kcal and 2.5g of protein (amino acids glutamine and arginine)  NUTRITION DIAGNOSIS:   Increased nutrient needs related to wound healing as evidenced by estimated needs  GOAL:   Patient will meet greater than or equal to 90% of their needs  MONITOR:   PO intake, Supplement acceptance, Labs, Weight trends, I & O's, Skin  REASON FOR ASSESSMENT:   Malnutrition Screening Tool (wounds)    ASSESSMENT:   54 y.o. male,  history of seizures, recurrent syncope, hyperlipidemia, iron deficiency anemia, Down syndrome, and more who presents to the ER with chief complaint of fever and cough.  Patient is nonverbal and was not able to provide any history whatsoever, but chart review reveals the patient was experiencing cough and shortness of breath.  Patient with down syndrome, nonverbal. Pt with chronic nonhealing wounds per WOC note.   Pt currently consuming 10% of meals. Supplements have already been ordered: Ensure, Juven, Prostat (can be replaced with Prosource).   Per weight records, pt has lost 10 lbs since 6/16 (10% wt loss x 4.5 months, significant for time frame).  Labs reviewed. Medications: Niferex, Vitamin B-12, Zinc sulfate  NUTRITION - FOCUSED PHYSICAL EXAM:  Deferred.  Diet Order:   Diet Order            Diet regular Room service appropriate? Yes; Fluid consistency: Thin  Diet effective now                 EDUCATION NEEDS:   No education needs have been identified at this time  Skin:  Skin Assessment: Skin Integrity Issues: Skin Integrity Issues:: Stage III Stage III: left hip, sacrum -chronic nonhealing per WOC note  Last BM:  11/1  Height:    Ht Readings from Last 1 Encounters:  06/22/20 5' (1.524 m)    Weight:   Wt Readings from Last 1 Encounters:  06/22/20 40.8 kg   BMI:  Body mass index is 17.58 kg/m.  Estimated Nutritional Needs:   Kcal:  1650-1850  Protein:  75-90g  Fluid:  1.8L/day  Tilda Franco, MS, RD, LDN Inpatient Clinical Dietitian Contact information available via Amion

## 2020-06-24 ENCOUNTER — Inpatient Hospital Stay (HOSPITAL_COMMUNITY): Payer: Medicare Other

## 2020-06-24 DIAGNOSIS — J189 Pneumonia, unspecified organism: Secondary | ICD-10-CM

## 2020-06-24 DIAGNOSIS — E870 Hyperosmolality and hypernatremia: Secondary | ICD-10-CM

## 2020-06-24 MED ORDER — VANCOMYCIN HCL 750 MG/150ML IV SOLN: 750.0000 mg | INTRAVENOUS | Status: DC

## 2020-06-24 MED ORDER — METRONIDAZOLE IN NACL 5-0.79 MG/ML-% IV SOLN
500.0000 mg | Freq: Three times a day (TID) | INTRAVENOUS | Status: DC
  Administered 2020-06-24 – 2020-06-25 (×3): 500 mg via INTRAVENOUS
  Filled 2020-06-24 (×3): qty 100

## 2020-06-24 MED ORDER — CEFAZOLIN SODIUM-DEXTROSE 2-4 GM/100ML-% IV SOLN
2.0000 g | Freq: Two times a day (BID) | INTRAVENOUS | Status: DC
  Administered 2020-06-24: 2 g via INTRAVENOUS
  Filled 2020-06-24 (×2): qty 100

## 2020-06-24 NOTE — Consult Note (Signed)
Chief Complaint: Patient was seen in consultation today for PICC placement with sedation.   Referring Physician(s): Dr. Rhona Leavens  Supervising Physician: Simonne Come  Patient Status: [facility] - In-pt  History of Present Illness: Tracy Todd is a 54 y.o. 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 He is followed by the Infectious Disease clinic. He presented to the ED 06/22/20 with fever, tachycardia, tachypnea and a cough. He was admitted for sepsis secondary to right lower lobe pneumonia.   The patient was scheduled to be seen at the [facility] Interventional Radiology department tomorrow, 06/25/20, for the placement of a PICC with sedation. The request is now to have this procedure done while inpatient.   Past Medical History:  Diagnosis Date  . Arthritis   . Down's syndrome   . Headache   . Hyperlipidemia   . Iron deficiency anemia   . Recurrent syncope   . Seizure disorder (HCC) 05/21/2020  . Seizures (HCC)     History reviewed. No pertinent surgical history.  Allergies: Penicillins  Medications: Prior to Admission medications   Medication Sig Start Date End Date Taking? Authorizing Provider  acetaminophen (TYLENOL) 500 MG tablet Take 500 mg by mouth every 6 (six) hours as needed for moderate pain.  04/14/20 04/14/21 Yes [provider]  Amino Acids-Protein Hydrolys (FEEDING SUPPLEMENT, PRO-STAT SUGAR FREE 64,) LIQD Take 30 mLs by mouth 2 (two) times daily. To aid in wound healing; resident prefers Prostat mixed in water. 02/14/20  Yes [provider]  aspirin 81 MG EC tablet Take 1 tablet (81 mg total) by mouth daily. 05/21/20  Yes York Spaniel, MD  benzocaine (AMERICAINE) 20 % oral spray Apply 1 application topically 3 (three) times daily as needed for throat irritation / pain.  03/18/20 03/18/21 Yes [provider]  bisacodyl (DULCOLAX) 10 MG suppository If not relieved by MOM, give 10 mg Bisacodyl suppositiory rectally X 1 dose in 24 hours as needed (Do not use constipation standing orders for residents with renal failure/CFR less than 30. Contact MD for orders) (Physician Order)  02/13/20  Yes [provider]  cefdinir (OMNICEF) 300 MG capsule Take 1 capsule (300 mg total) by mouth 2 (two) times daily. 06/12/20  Yes Odette Fraction, MD  cetirizine (ZYRTEC) 10 MG tablet Take 10 mg by mouth daily as needed for allergies.    Yes [provider]  diclofenac Sodium (VOLTAREN) 1 % GEL Apply 2 g topically 4 (four) times daily. To both knees 03/03/20  Yes Ngetich, Dinah C, NP  Ensure Plus (ENSURE PLUS) LIQD Take 237 mLs by mouth 2 (two) times daily between meals.    Yes [provider]  FIBER ADULT GUMMIES PO Take 1 tablet by mouth in the morning and at bedtime.    Yes [provider]  finasteride (PROSCAR) 5 MG tablet Take 5 mg by mouth daily. 04/30/20  Yes [provider]  hydrocortisone 2.5 % cream Apply 1 application topically 2 (two) times daily as needed (skin rash).  04/14/20 04/14/21 Yes [provider]  iron polysaccharides (NU-IRON) 150 MG capsule Take 1 capsule (150 mg total) by mouth daily. 03/03/20  Yes Ngetich, Dinah C, NP  ketoconazole (NIZORAL) 2 % shampoo Apply 1 application topically See admin instructions. Use to wash hair and face, alternate by week. 03/03/20  Yes Ngetich, Dinah C, NP  levETIRAcetam (KEPPRA) 500 MG  tablet Take 1 tablet (500 mg total) by mouth 2 (two) times daily. 03/03/20  Yes Ngetich, Dinah C, NP  magnesium hydroxide (MILK OF MAGNESIA) 400 MG/5ML suspension If no BM in 3 days, give 30 cc Milk of Magnesium p.o. x 1 dose in 24 hours as needed (Do not use standing constipation orders for residents with renal failure CFR less than 30. Contact MD for orders) (Physician Order)  02/13/20  Yes [provider]  Multiple  Vitamins-Minerals (MULTIVITAMIN WITH MINERALS) tablet Take 1 tablet by mouth daily.   Yes [provider]  nutrition supplement, JUVEN, (JUVEN) PACK Take 1 packet by mouth 2 (two) times daily between meals. To aid in wound healing. 02/14/20  Yes [provider]  pravastatin (PRAVACHOL) 40 MG tablet Take 1 tablet (40 mg total) by mouth daily. 03/03/20  Yes Ngetich, Dinah C, NP  SANTYL ointment Apply 1 application topically daily.  04/14/20  Yes [provider]  selenium sulfide (SELSUN) 1 % LOTN Apply 1 application topically daily. 03/03/20  Yes Ngetich, Dinah C, NP  Sodium Phosphates (RA SALINE ENEMA RE) If not relieved by Biscodyl suppository, give disposable Saline Enema rectally X 1 dose/24 hrs as needed (Do not use constipation standing orders for residents with renal failure/CFR less than 30. Contact MD for orders)(Physician Or  02/13/20  Yes [provider]  vitamin B-12 (CYANOCOBALAMIN) 500 MCG tablet Take 1 tablet (500 mcg total) by mouth daily. 03/03/20  Yes Ngetich, Dinah C, NP  zinc gluconate 50 MG tablet Take 1 tablet (50 mg total) by mouth daily. 03/03/20  Yes Ngetich, Dinah C, NP  Disposable Gloves MISC Use gloves for diaper changes.Dispense 2 boxes 03/19/20   [provider]  Incontinence Supply Disposable (COMFORT SHIELD ADULT DIAPERS) MISC Dispense 5 boxes/mo. Use 4-6 times daily for fecal incontinence and diarrhea. 03/19/20   [provider]  Incontinence Supply Disposable (RA ADULT WIPES) MISC Please dispense 4 boxes/wipes per month. 03/19/20   [provider]  Incontinence Supply Disposable (UNDERPADS MEDIUM) MISC Dispense 3 boxes/mo for fecal and urinary incontinence and diarrhea. 03/19/20   [provider]  metroNIDAZOLE (FLAGYL) 500 MG tablet Take 1 tablet (500 mg total) by mouth 3 (three) times daily. 06/10/20   Odette Fraction, MD  Misc. Devices MISC Take by mouth.  03/18/20   [provider]  Misc. Devices  MISC Manual wheelchair. 16 inch wide with elevating leg rests, high reclining back and neck support and 16-inch cushion. 03/18/20   [provider]  NON FORMULARY Regular liberalized diet d/t Dx Malnutrition.  02/14/20   [provider]  UNABLE TO FIND Med Name: anti dandruff shampoo    [provider]     History reviewed. No pertinent family history.  Social History   Socioeconomic History  . Marital status: Single    Spouse name: Not on file  . Number of children: 0  . Years of education: Not on file  . Highest education level: Not on file  Occupational History  . Not on file  Tobacco Use  . Smoking status: Never Smoker  . Smokeless tobacco: Never Used  Vaping Use  . Vaping Use: Never used  Substance and Sexual Activity  . Alcohol use: Never  . Drug use: Never  . Sexual activity: Not on file  Other Topics Concern  . Not on file  Social History Narrative   05/21/20 lives in caregiver's home   Social Determinants of Health   Financial Resource Strain:   .  Difficulty of Paying Living Expenses: Not on file  Food Insecurity:   . Worried About Programme researcher, broadcasting/film/video in the Last Year: Not on file  . Ran Out of Food in the Last Year: Not on file  Transportation Needs:   . Lack of Transportation (Medical): Not on file  . Lack of Transportation (Non-Medical): Not on file  Physical Activity:   . Days of Exercise per Week: Not on file  . Minutes of Exercise per Session: Not on file  Stress:   . Feeling of Stress : Not on file  Social Connections:   . Frequency of Communication with Friends and Family: Not on file  . Frequency of Social Gatherings with Friends and Family: Not on file  . Attends Religious Services: Not on file  . Active Member of Clubs or Organizations: Not on file  . Attends Banker Meetings: Not on file  . Marital Status: Not on file    Review of Systems: A 12 point ROS discussed and pertinent positives are indicated in  the HPI above.  All other systems are negative.  Review of Systems  Unable to perform ROS: Other    Vital Signs: BP 118/76 (BP Location: Right Arm)   Pulse 93   Temp 97.6 F (36.4 C) (Oral)   Resp 14   Ht 5' (1.524 m)   Wt 90 lb (40.8 kg)   SpO2 99%   BMI 17.58 kg/m   Physical Exam Constitutional:      Appearance: He is ill-appearing.     Comments: Sleepy but arousable. Mostly non-verbal. Unable to follow commands.   HENT:     Mouth/Throat:     Mouth: Mucous membranes are dry.     Pharynx: Oropharynx is clear.  Cardiovascular:     Rate and Rhythm: Normal rate and regular rhythm.  Pulmonary:     Effort: Pulmonary effort is normal.     Breath sounds: Normal breath sounds.  Abdominal:     General: Bowel sounds are normal.     Palpations: Abdomen is soft.  Musculoskeletal:     Comments: Lower extremity contracture.   Skin:    General: Skin is warm and dry.     Comments: Sacral decubitus ulcer; not visualized.   Neurological:     Comments: Unable to assess. Patient was minimally interactive. He became agitated when I assessed him and cried/yelled out.       Imaging: CT Chest W Contrast  Result Date: 06/22/2020 CLINICAL DATA:  Tachypnea, sacral decubitus ulcer, fever, sepsis EXAM: CT CHEST, ABDOMEN, AND PELVIS WITH CONTRAST TECHNIQUE: Multidetector CT imaging of the chest, abdomen and pelvis was performed following the standard protocol during bolus administration of intravenous contrast. CONTRAST:  OMNIPAQUE IOHEXOL 300 MG/ML  SOLN COMPARISON:  06/22/2020, 09/16/2019, 02/05/2020 FINDINGS: CT CHEST FINDINGS Cardiovascular: The heart and great vessels are unremarkable without pericardial effusion. Normal caliber of the thoracic aorta. Mediastinum/Nodes: No enlarged mediastinal, hilar, or axillary lymph nodes. Thyroid gland, trachea, and esophagus demonstrate no significant findings. Lungs/Pleura: Dependent right lower lobe consolidation could reflect airspace disease or  atelectasis. No effusion or pneumothorax. Mucoid material is seen within the right lower lobe bronchus. No effusion or pneumothorax. Musculoskeletal: No acute or destructive bony lesions. Reconstructed images demonstrate no additional findings. CT ABDOMEN PELVIS FINDINGS Hepatobiliary: No focal liver abnormality is seen. No gallstones, gallbladder wall thickening, or biliary dilatation. Pancreas: Unremarkable. No pancreatic ductal dilatation or surrounding inflammatory changes. Spleen: Normal in size without focal abnormality. Adrenals/Urinary  Tract: There is a punctate calcification layering dependently within the bladder. No other urinary tract calculi or signs of obstruction. The kidneys enhance normally and symmetrically. The adrenals are unremarkable. Stomach/Bowel: No bowel obstruction or ileus. No bowel wall thickening or inflammatory change. Vascular/Lymphatic: No significant vascular findings are present. No enlarged abdominal or pelvic lymph nodes. Reproductive: Prostate is unremarkable. Other: No free fluid or free gas. No abdominal wall hernia. Musculoskeletal: Persistent decubitus ulceration overlying the distal sacrum and coccyx. Since the prior exam, erosive changes are seen within the dorsal aspect of the lower sacrum at approximately the S3 and S4 levels. No periosteal reaction. Bilateral L5 pars defects and grade 2 anterolisthesis of L5 on S1 unchanged. Stable chronic compression deformities involving the superior endplates of the L3 and L4 vertebral bodies. Severe spondylosis at L5/S1 unchanged. Reconstructed images demonstrate no additional findings. IMPRESSION: 1. Dependent right lower lobe consolidation with mucoid material in the right lower lobe bronchus. Findings could reflect mucous plugging, aspiration, or infection. 2. Persistent decubitus ulceration overlying the distal sacrum and coccyx. Since the prior exam, erosive changes within the dorsal aspect of the lower sacrum at approximately  the S3 and S4 levels, consistent with chronic osteomyelitis. 3. Punctate nonobstructing calculus within the bladder lumen. Electronically Signed   By: Sharlet Salina M.D.   On: 06/22/2020 21:46   CT ABDOMEN PELVIS W CONTRAST  Result Date: 06/22/2020 CLINICAL DATA:  Tachypnea, sacral decubitus ulcer, fever, sepsis EXAM: CT CHEST, ABDOMEN, AND PELVIS WITH CONTRAST TECHNIQUE: Multidetector CT imaging of the chest, abdomen and pelvis was performed following the standard protocol during bolus administration of intravenous contrast. CONTRAST:  OMNIPAQUE IOHEXOL 300 MG/ML  SOLN COMPARISON:  06/22/2020, 09/16/2019, 02/05/2020 FINDINGS: CT CHEST FINDINGS Cardiovascular: The heart and great vessels are unremarkable without pericardial effusion. Normal caliber of the thoracic aorta. Mediastinum/Nodes: No enlarged mediastinal, hilar, or axillary lymph nodes. Thyroid gland, trachea, and esophagus demonstrate no significant findings. Lungs/Pleura: Dependent right lower lobe consolidation could reflect airspace disease or atelectasis. No effusion or pneumothorax. Mucoid material is seen within the right lower lobe bronchus. No effusion or pneumothorax. Musculoskeletal: No acute or destructive bony lesions. Reconstructed images demonstrate no additional findings. CT ABDOMEN PELVIS FINDINGS Hepatobiliary: No focal liver abnormality is seen. No gallstones, gallbladder wall thickening, or biliary dilatation. Pancreas: Unremarkable. No pancreatic ductal dilatation or surrounding inflammatory changes. Spleen: Normal in size without focal abnormality. Adrenals/Urinary Tract: There is a punctate calcification layering dependently within the bladder. No other urinary tract calculi or signs of obstruction. The kidneys enhance normally and symmetrically. The adrenals are unremarkable. Stomach/Bowel: No bowel obstruction or ileus. No bowel wall thickening or inflammatory change. Vascular/Lymphatic: No significant vascular findings  are present. No enlarged abdominal or pelvic lymph nodes. Reproductive: Prostate is unremarkable. Other: No free fluid or free gas. No abdominal wall hernia. Musculoskeletal: Persistent decubitus ulceration overlying the distal sacrum and coccyx. Since the prior exam, erosive changes are seen within the dorsal aspect of the lower sacrum at approximately the S3 and S4 levels. No periosteal reaction. Bilateral L5 pars defects and grade 2 anterolisthesis of L5 on S1 unchanged. Stable chronic compression deformities involving the superior endplates of the L3 and L4 vertebral bodies. Severe spondylosis at L5/S1 unchanged. Reconstructed images demonstrate no additional findings. IMPRESSION: 1. Dependent right lower lobe consolidation with mucoid material in the right lower lobe bronchus. Findings could reflect mucous plugging, aspiration, or infection. 2. Persistent decubitus ulceration overlying the distal sacrum and coccyx. Since the prior exam, erosive changes  within the dorsal aspect of the lower sacrum at approximately the S3 and S4 levels, consistent with chronic osteomyelitis. 3. Punctate nonobstructing calculus within the bladder lumen. Electronically Signed   By: Sharlet SalinaMichael  Brown M.D.   On: 06/22/2020 21:46   DG Chest Port 1 View  Result Date: 06/22/2020 CLINICAL DATA:  Sacral decubitus ulcer, sepsis, fever EXAM: PORTABLE CHEST 1 VIEW COMPARISON:  02/14/2020 FINDINGS: Single frontal view of the chest demonstrates a stable cardiac silhouette. Increased density at the medial right lung base could reflect atelectasis/scarring or airspace disease. There is slight elevation of the right hemidiaphragm, new since prior study. No effusion or pneumothorax. No acute bony abnormalities. IMPRESSION: 1. Increased density at the medial right lung base, with elevation of the right hemidiaphragm suggesting volume loss and atelectasis or scarring. Superimposed airspace disease cannot be completely excluded in the clinical  scenario. Electronically Signed   By: Sharlet SalinaMichael  Brown M.D.   On: 06/22/2020 17:32   DG Swallowing Func-Speech Pathology  Result Date: 06/24/2020 Objective Swallowing Evaluation: Type of Study: MBS-Modified Barium Swallow Study  Patient Details Name: Tracy AmbleKeith Todd MRN: [id number] Date of Birth: 08-14-1966 Today's Date: 06/24/2020 Time: SLP Start Time (ACUTE ONLY): 1305 -SLP Stop Time (ACUTE ONLY): 1335 SLP Time Calculation (min) (ACUTE ONLY): 30 min Past Medical History: Past Medical History: Diagnosis Date . Arthritis  . Down's syndrome  . Headache  . Hyperlipidemia  . Iron deficiency anemia  . Recurrent syncope  . Seizure disorder (HCC) 05/21/2020 . Seizures (HCC)  Past Surgical History: No past surgical history on file. HPI: 54yo male admitted 06/22/20 with fever and cough with cxr concerning for aspiration pna. PMH: seizures, recurrent syncope, HLD, iron deficiency anemia, Down Syndrome, nonverbal, bedbound last 7-8 months with steady and chronic decline in function.  Pt underwent BSE with recommendation for MBS due to pt's pna and coughing post-swallow of minimal intake he would accept.  Subjective: pt awake in chair, required repositioning for view Assessment / Plan / Recommendation CHL IP CLINICAL IMPRESSIONS 06/24/2020 Clinical Impression Mild oropharyngeal dysphagia without aspiration or laryngeal penetration to vocal cord with any consistency tested.  Of note, pt did NOT cough during the study = which he is clinically conducting after his intake per notes from BSE.  Pt does present with delayed oral transiting, lingual pumping with foods requiring mastication due to discoordination.  Pharyngeal swallow was marginally weak with decreased tongue base retraction which allows minimal retention of solids without explicit awareness.  Secretions also appeared retained in pharynx and mixed with retained barium.  Recommend pt continue regular/thin diet and medications with pudding - whole.  Advise pt start all intake  with liquids and drink liquids during meal.  Sister present during the pt's MBS and was informed to findings/recommendations.  Since positioning is an issue for this pt, advise to consider reverse [place] for optimal safety with intake.  Will follow up briefly for education/po tolerance.  Advised pt's sister to mitigation strategies for this pt as he is unable to follow directions for strengthening exercises, etc. Reviewed MBS findings with pt's sister including pt's appearance of osteophytes along with deconditioning possibly impacting epiglottic deflection, contributing to dysphagia.  SLP Visit Diagnosis Dysphagia, oropharyngeal phase (R13.12) Attention and concentration deficit following -- Frontal lobe and executive function deficit following -- Impact on safety and function Mild aspiration risk   CHL IP TREATMENT RECOMMENDATION 06/24/2020 Treatment Recommendations Therapy as outlined in treatment plan below   Prognosis 06/24/2020 Prognosis for Safe Diet Advancement Fair Barriers to Reach  Goals Cognitive deficits Barriers/Prognosis Comment -- CHL IP DIET RECOMMENDATION 06/24/2020 SLP Diet Recommendations Regular solids;Thin liquid Liquid Administration via Cup;Straw Medication Administration Whole meds with puree Compensations Minimize environmental distractions;Slow rate;Small sips/bites Postural Changes Seated upright at 90 degrees;Remain semi-upright after after feeds/meals (Comment)   CHL IP OTHER RECOMMENDATIONS 06/24/2020 Recommended Consults -- Oral Care Recommendations Oral care QID Other Recommendations --   CHL IP FOLLOW UP RECOMMENDATIONS 06/24/2020 Follow up Recommendations 24 hour supervision/assistance   CHL IP FREQUENCY AND DURATION 06/24/2020 Speech Therapy Frequency (ACUTE ONLY) -- Treatment Duration 1 week;2 weeks      CHL IP ORAL PHASE 06/24/2020 Oral Phase WFL Oral - Pudding Teaspoon -- Oral - Pudding Cup -- Oral - Honey Teaspoon -- Oral - Honey Cup -- Oral - Nectar Teaspoon -- Oral - Nectar  Cup -- Oral - Nectar Straw WFL Oral - Thin Teaspoon WFL Oral - Thin Cup -- Oral - Thin Straw WFL Oral - Puree WFL Oral - Mech Soft Premature spillage;Weak lingual manipulation;Lingual pumping;Reduced posterior propulsion;Delayed oral transit Oral - Regular -- Oral - Multi-Consistency -- Oral - Pill WFL Oral Phase - Comment --  CHL IP PHARYNGEAL PHASE 06/24/2020 Pharyngeal Phase WFL Pharyngeal- Pudding Teaspoon -- Pharyngeal -- Pharyngeal- Pudding Cup -- Pharyngeal -- Pharyngeal- Honey Teaspoon -- Pharyngeal -- Pharyngeal- Honey Cup -- Pharyngeal -- Pharyngeal- Nectar Teaspoon -- Pharyngeal -- Pharyngeal- Nectar Cup -- Pharyngeal -- Pharyngeal- Nectar Straw WFL Pharyngeal Material does not enter airway Pharyngeal- Thin Teaspoon WFL Pharyngeal Material does not enter airway Pharyngeal- Thin Cup -- Pharyngeal -- Pharyngeal- Thin Straw Penetration/Aspiration during swallow Pharyngeal Material enters airway, remains ABOVE vocal cords then ejected out Pharyngeal- Puree WFL Pharyngeal Material does not enter airway Pharyngeal- Mechanical Soft Delayed swallow initiation-vallecula;Pharyngeal residue - valleculae;Reduced epiglottic inversion;Reduced tongue base retraction Pharyngeal Material does not enter airway Pharyngeal- Regular -- Pharyngeal -- Pharyngeal- Multi-consistency -- Pharyngeal -- Pharyngeal- Pill WFL Pharyngeal Material does not enter airway Pharyngeal Comment --  CHL IP CERVICAL ESOPHAGEAL PHASE 06/24/2020 Cervical Esophageal Phase WFL Pudding Teaspoon -- Pudding Cup -- Honey Teaspoon -- Honey Cup -- Nectar Teaspoon -- Nectar Cup -- Nectar Straw -- Thin Teaspoon -- Thin Cup -- Thin Straw -- Puree -- Mechanical Soft -- Regular -- Multi-consistency -- Pill -- Cervical Esophageal Comment WFL [name], MS [facility] SLP Acute Rehab Services Office [phone] Pager [phone] [name] 06/24/2020, 2:04 PM               Labs:  CBC: Recent Labs    02/13/20 0241 02/13/20 0241 02/18/20 0000 06/10/20 1625  06/22/20 1700 06/23/20 0511  WBC 7.3  --  8.0 6.4 7.5 6.8  HGB 11.6*   < > 11.0* 13.0* 13.1 10.5*  HCT 38.1*   < > 35* 41.0 45.7 36.9*  PLT 388  --   --  455* 438* 285   < > = values in this interval not displayed.    COAGS: Recent Labs    06/22/20 1700 06/23/20 0511  INR 1.7* 1.7*  APTT 32  --     BMP: Recent Labs    02/07/20 0556 02/07/20 0556 02/12/20 0856 02/13/20 0241 02/13/20 0241 02/18/20 0000 06/10/20 1625 06/22/20 1700 06/23/20 0511  NA 137   < >  --  139  --  140 140 152* 145  K 4.5   < >  --  4.1   < > 3.8 4.4 3.5 3.7  CL 102   < >  --  101   < >  102 104 114* 110  CO2 28   < >  --  29   < > 24* 26 26 28   GLUCOSE 91   < >  --  103*  --   --  108* 130* 101*  BUN 16   < >  --  30*  --  26* 11 41* 21*  CALCIUM 8.6*   < >  --  8.7*   < > 8.6* 8.7 8.4* 7.9*  CREATININE 0.88   < > 0.87 0.75  --  0.7 0.67* 0.90 0.68  GFRNONAA >60   < > >60 >60  --  >90  --  >60 >60  GFRAA >60  --  >60 >60  --  >90  --   --   --    < > = values in this interval not displayed.    LIVER FUNCTION TESTS: Recent Labs    09/16/19 0816 09/16/19 0816 02/05/20 1140 06/10/20 1625 06/22/20 1700 06/23/20 0511  BILITOT 0.5   < > 0.4 0.4 0.7 0.7  AST 25   < > 30 20 59* 32  ALT 18   < > 26 11 45* 29  ALKPHOS 52  --  63  --  94 62  PROT 6.2*   < > 6.6 6.4 6.9 5.1*  ALBUMIN 3.3*  --  3.0*  --  2.9* 2.2*   < > = values in this interval not displayed.    TUMOR MARKERS: No results for input(s): AFPTM, CEA, CA199, CHROMGRNA in the last 8760 hours.  Assessment and Plan:  Stage IV sacral decubitus ulcer; Osteomyelitis: Tracy Todd, 54 year old male, is tentatively scheduled to be seen 06/25/20 at the [facility] Interventional Radiology department for the placement of a PICC with sedation. The patient requires a PICC for long-term IV antibiotics. This procedure was discussed with and consent obtained from the patient's sister (POA) [facility].  Risks and benefits discussed with the  patient's sister including, but not limited to bleeding, infection and vascular injury.  All of the patient's sister's questions were answered and she is agreeable to proceed.  The patient will be NPO after midnight.   Consent signed and in the IR control room.   Thank you for this interesting consult.  I greatly enjoyed meeting Tracy Todd and look forward to participating in their care.  A copy of this report was sent to the requesting provider on this date.  Electronically Signed: Ernesta Todd, AGACNP-BC [phone] 06/24/2020, 4:12 PM   I spent a total of 20 Minutes    in face to face in clinical consultation, greater than 50% of which was counseling/coordinating care for PICC placement with sedation.

## 2020-06-24 NOTE — TOC Progression Note (Signed)
Transition of Care [facility]) - Progression Note    Patient Details  Name: Tracy Todd MRN: [id number] Date of Birth: Jun 06, 1966  Transition of Care [facility]) CM/SW Contact  Geni Bers, RN Phone Number: 06/24/2020, 1:14 PM  Clinical Narrative:     Pt will discharge home with family, 24/7 Care givers, Home with [facility] Care with HHRN/PT and [facility] for IV ABX.   Expected Discharge Plan: Home w Home Health Services Barriers to Discharge: No Barriers Identified  Expected Discharge Plan and Services Expected Discharge Plan: Home w Home Health Services       Living arrangements for the past 2 months: Single Family Home                                       Social Determinants of Health (SDOH) Interventions    Readmission Risk Interventions No flowsheet data found.

## 2020-06-24 NOTE — Progress Notes (Addendum)
Pharmacy Antibiotic Note  Drago Hammonds is a 54 y.o. male admitted on 06/22/2020 with history of seizures, recurrent syncope, hyperlipidemia, iron deficiency anemia, Down syndrome presented to ED with CC of fever, cough. Pt also has chronic non-healing sacral decubitus ulcer with OM was taking cefdinir + metronidazole PTA.  Today, 06/24/20  WBC WNL  SCr WNL, CrCl ~50 mL/min. Pt bedbound so not entirely accurate depiction of renal function. GFR >60  Lactate 1.9 > 2.1  Plan:  Decrease vancomycin dose to 750 mg IV q24h for goal VT 15-20 and/or AUC 400-550  Continue cefepime 2 g IV q12h  Follow renal function and culture data  Height: 5' (152.4 cm) Weight: 40.8 kg (90 lb) IBW/kg (Calculated) : 50  Temp (24hrs), Avg:97.7 F (36.5 C), Min:97.4 F (36.3 C), Max:98.1 F (36.7 C)  Recent Labs  Lab 06/22/20 1700 06/22/20 1855 06/23/20 0511  WBC 7.5  --  6.8  CREATININE 0.90  --  0.68  LATICACIDVEN 1.5 1.9 2.1*    Estimated Creatinine Clearance: 60.2 mL/min (by C-G formula based on SCr of 0.68 mg/dL).    Allergies  Allergen Reactions  . Penicillins     Unknown Did it involve swelling of the face/tongue/throat, SOB, or low BP? Unk Did it involve sudden or severe rash/hives, skin peeling, or any reaction on the inside of your mouth or nose? Unk Did you need to seek medical attention at a hospital or doctor's office? Unk When did it last happen?childhood If all above answers are "NO", may proceed with cephalosporin use.      Antimicrobials this admission: 11/1 vanc >> 11/1 cefepime >>  Dose adjustments this admission: 11/3: vanc 1 g IV q24h --> 750 mg IV q24h  Microbiology results: 11/1 BCx: ngtd 11/1 UCx: multiple species, suggest recollection  Thank you for allowing pharmacy to be a part of this patient's care.   Cindi Carbon, PharmD 06/24/20 9:49 AM   Addendum:  Antibiotics being de-escalated to cefazolin, pharmacy to dose.  Plan: Cefazolin 2 g IV  q12h  Cindi Carbon,  PharmD 06/24/20 12:30 PM

## 2020-06-24 NOTE — Progress Notes (Signed)
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  Patient was followed by infectious disease clinic and supposed to have a PICC line placement and 6 weeks of IV antibiotics.  Currently on oral antibiotics with cefdinir and Flagyl. In the emergency room, temperature 99.7.  Heart rate 102.  Blood pressures fairly normal.  89% on room air. Lactic acid 1.5.  CT scan of the chest abdomen pelvis showed right lower lobe consolidation with mucoid material on the right lower lobe bronchus.  Decubitus ulceration, erosive changes of the sacrum.  Patient was treated as sepsis and admitted to the hospital.  Assessment & Plan:   Active Problems:   Pressure injury of skin   Sepsis (HCC)   Sepsis secondary to right lower lobe pneumonia with hypoxia: Suspect aspiration.  Suspect both gram-positive and gram-negative bacteremia. On cefdinir and Flagyl as outpatient.  Blood cultures were drawn and treated with cefepime and vancomycin that we will continue until final cultures available. Oxygen to keep saturations more than 90%. Patient is unable to follow any commands or instructions to do chest physiotherapy.   SLP consulted and pt is now s/p modified barium swallow. Rec for regular solids with thin liquids  Chronic decubitus ulcer/sacral osteomyelitis: Present on admission.  Stage IV sacral decubitus. Blood cx neg Have requested PICC for long-term abx under sedation, plan for 11/4 Have transitioned abx to ancef plus flagyl per ID recs  Hyperlipidemia: Continue statin as tolerated  Seizure disorder:  Remains stable on Keppra.  Down syndrome/bedbound status/contractures and incontinence: Lives at home with family.  Full Code  Hypernatremia: Due to free water deficit.  Treated with isotonic fluid, normalized Repeat bmet in AM   DVT prophylaxis: Heparin subq Code Status: Full Family Communication: Pt in room  Status is: Inpatient  Remains inpatient appropriate because:Ongoing diagnostic testing needed not appropriate for outpatient work up, Unsafe d/c plan and IV treatments appropriate due to intensity of illness or inability to take PO   Dispo: The patient is from: Home              Anticipated d/c is to: Home              Anticipated d/c date is: 2 days              Patient currently is not medically stable to d/c.       Consultants:   ID  IR  Procedures:     Antimicrobials: Anti-infectives (From admission, onward)   Start     Dose/Rate Route Frequency Ordered Stop   06/24/20 1800  vancomycin (VANCOREADY) IVPB 750 mg/150 mL  Status:  Discontinued        750 mg 150 mL/hr over 60 Minutes Intravenous Every 24 hours 06/24/20 0938 06/24/20 1205   06/24/20 1700  ceFAZolin (ANCEF) IVPB 2g/100 mL premix        2 g 200 mL/hr over 30 Minutes Intravenous Every 12 hours 06/24/20 1232     06/24/20 1300  metroNIDAZOLE (FLAGYL) IVPB 500 mg        500 mg 100 mL/hr over 60 Minutes Intravenous Every 8 hours 06/24/20 1205  06/23/20 1800  vancomycin (VANCOCIN) IVPB 1000 mg/200 mL premix  Status:  Discontinued        1,000 mg 200 mL/hr over 60 Minutes Intravenous Every 24 hours 06/23/20 0325 06/24/20 0938   06/23/20 1000  levofloxacin (LEVAQUIN) tablet 750 mg  Status:  Discontinued        750 mg Oral Daily 06/22/20 2228 06/22/20 2228   06/23/20 0600  ceFEPIme (MAXIPIME) 2 g in sodium chloride 0.9 % 100 mL IVPB  Status:  Discontinued        2 g 200 mL/hr over 30 Minutes Intravenous Every 12 hours 06/23/20 0325 06/24/20 1205   06/22/20 1715  aztreonam (AZACTAM) 2 g in sodium  chloride 0.9 % 100 mL IVPB  Status:  Discontinued        2 g 200 mL/hr over 30 Minutes Intravenous  Once 06/22/20 1700 06/22/20 1712   06/22/20 1715  metroNIDAZOLE (FLAGYL) IVPB 500 mg        500 mg 100 mL/hr over 60 Minutes Intravenous  Once 06/22/20 1700 06/22/20 2001   06/22/20 1715  vancomycin (VANCOCIN) IVPB 1000 mg/200 mL premix        1,000 mg 200 mL/hr over 60 Minutes Intravenous  Once 06/22/20 1700 06/22/20 2029   06/22/20 1715  ceFEPIme (MAXIPIME) 2 g in sodium chloride 0.9 % 100 mL IVPB        2 g 200 mL/hr over 30 Minutes Intravenous  Once 06/22/20 1712 06/22/20 1853       Subjective: Unable to assess given current mentation  Objective: Vitals:   06/23/20 1219 06/23/20 2005 06/24/20 0530 06/24/20 1417  BP: (!) 89/60 102/62 109/63 118/76  Pulse: 75 67 71 93  Resp: 20  14 14   Temp: 98.1 F (36.7 C) (!) 97.4 F (36.3 C) 97.6 F (36.4 C)   TempSrc: Oral Axillary Oral   SpO2: 100%  100% 99%  Weight:      Height:        Intake/Output Summary (Last 24 hours) at 06/24/2020 1823 Last data filed at 06/24/2020 0930 Gross per 24 hour  Intake 60 ml  Output --  Net 60 ml   Filed Weights   06/22/20 1713  Weight: 40.8 kg    Examination:  General exam: Appears calm and comfortable  Respiratory system: Clear to auscultation. Respiratory effort normal. Cardiovascular system: S1 & S2 heard, Regular Gastrointestinal system: Abdomen is nondistended, soft and nontender. No organomegaly or masses felt. Normal bowel sounds heard. Central nervous system: Alert and awake. No focal neurological deficits. Extremities: Symmetric 5 x 5 power. Skin: No rashes, lesions  Psychiatry: Difficult to assess given current mentation.   Data Reviewed: I have personally reviewed following labs and imaging studies  CBC: Recent Labs  Lab 06/22/20 1700 06/23/20 0511  WBC 7.5 6.8  NEUTROABS 6.3  --   HGB 13.1 10.5*  HCT 45.7 36.9*  MCV 105.5* 107.0*  PLT 438* 285   Basic Metabolic  Panel: Recent Labs  Lab 06/22/20 1700 06/23/20 0511  NA 152* 145  K 3.5 3.7  CL 114* 110  CO2 26 28  GLUCOSE 130* 101*  BUN 41* 21*  CREATININE 0.90 0.68  CALCIUM 8.4* 7.9*   GFR: Estimated Creatinine Clearance: 60.2 mL/min (by C-G formula based on SCr of 0.68 mg/dL). Liver Function Tests: Recent Labs  Lab 06/22/20 1700 06/23/20 0511  AST 59* 32  ALT 45* 29  ALKPHOS 94 62  BILITOT 0.7 0.7  PROT 6.9 5.1*  ALBUMIN 2.9*  2.2*   No results for input(s): LIPASE, AMYLASE in the last 168 hours. No results for input(s): AMMONIA in the last 168 hours. Coagulation Profile: Recent Labs  Lab 06/22/20 1700 06/23/20 0511  INR 1.7* 1.7*   Cardiac Enzymes: No results for input(s): CKTOTAL, CKMB, CKMBINDEX, TROPONINI in the last 168 hours. BNP (last 3 results) No results for input(s): PROBNP in the last 8760 hours. HbA1C: No results for input(s): HGBA1C in the last 72 hours. CBG: No results for input(s): GLUCAP in the last 168 hours. Lipid Profile: No results for input(s): CHOL, HDL, LDLCALC, TRIG, CHOLHDL, LDLDIRECT in the last 72 hours. Thyroid Function Tests: No results for input(s): TSH, T4TOTAL, FREET4, T3FREE, THYROIDAB in the last 72 hours. Anemia Panel: No results for input(s): VITAMINB12, FOLATE, FERRITIN, TIBC, IRON, RETICCTPCT in the last 72 hours. Sepsis Labs: Recent Labs  Lab 06/22/20 1700 06/22/20 1855 06/23/20 0511  PROCALCITON  --   --  0.19  LATICACIDVEN 1.5 1.9 2.1*    Recent Results (from the past 240 hour(s))  Blood Culture (routine x 2)     Status: None (Preliminary result)   Collection Time: 06/22/20  5:55 PM   Specimen: BLOOD  Result Value Ref Range Status   Specimen Description   Final    BLOOD RIGHT ANTECUBITAL Performed at [facility], [address], [place], [place] [address]    Special Requests   Final    BOTTLES DRAWN AEROBIC AND ANAEROBIC Blood Culture results may not be optimal due to an inadequate volume of  blood received in culture bottles Performed at [facility], [address] [place], [place] [address]    Culture   Final    NO GROWTH 2 DAYS Performed at [facility], [address] [place], [place] [address]    Report Status PENDING  Incomplete  Respiratory Panel by RT PCR (Flu A&B, Covid) - Nasopharyngeal Swab     Status: None   Collection Time: 06/22/20  5:55 PM   Specimen: Nasopharyngeal Swab  Result Value Ref Range Status   SARS Coronavirus 2 by RT PCR NEGATIVE NEGATIVE Final    Comment: (NOTE) SARS-CoV-2 target nucleic acids are NOT DETECTED.  The SARS-CoV-2 RNA is generally detectable in upper respiratoy specimens during the acute phase of infection. The lowest concentration of SARS-CoV-2 viral copies this assay can detect is 131 copies/mL. A negative result does not preclude SARS-Cov-2 infection and should not be used as the sole basis for treatment or other patient management decisions. A negative result may occur with  improper specimen collection/handling, submission of specimen other than nasopharyngeal swab, presence of viral mutation(s) within the areas targeted by this assay, and inadequate number of viral copies (<131 copies/mL). A negative result must be combined with clinical observations, patient history, and epidemiological information. The expected result is Negative.  Fact Sheet for Patients:  https://www.moore.com/  Fact Sheet for Healthcare Providers:  https://www.young.biz/  This test is no t yet approved or cleared by the [place] FDA and  has been authorized for detection and/or diagnosis of SARS-CoV-2 by FDA under an Emergency Use Authorization (EUA). This EUA will remain  in effect (meaning this test can be used) for the duration of the COVID-19 declaration under Section 564(b)(1) of the Act, 21 U.S.C. section 360bbb-3(b)(1), unless the authorization is terminated or revoked  sooner.     Influenza A by PCR NEGATIVE NEGATIVE Final   Influenza B by PCR NEGATIVE NEGATIVE Final  Comment: (NOTE) The Xpert Xpress SARS-CoV-2/FLU/RSV assay is intended as an aid in  the diagnosis of influenza from Nasopharyngeal swab specimens and  should not be used as a sole basis for treatment. Nasal washings and  aspirates are unacceptable for Xpert Xpress SARS-CoV-2/FLU/RSV  testing.  Fact Sheet for Patients: https://www.moore.com/https://www.fda.gov/media/142436/download  Fact Sheet for Healthcare Providers: https://www.young.biz/https://www.fda.gov/media/142435/download  This test is not yet approved or cleared by the [place] FDA and  has been authorized for detection and/or diagnosis of SARS-CoV-2 by  FDA under an Emergency Use Authorization (EUA). This EUA will remain  in effect (meaning this test can be used) for the duration of the  Covid-19 declaration under Section 564(b)(1) of the Act, 21  U.S.C. section 360bbb-3(b)(1), unless the authorization is  terminated or revoked. Performed at [facility], [address] [place], [place] [address]   Blood Culture (routine x 2)     Status: None (Preliminary result)   Collection Time: 06/22/20  6:00 PM   Specimen: BLOOD  Result Value Ref Range Status   Specimen Description   Final    BLOOD LEFT ANTECUBITAL Performed at [facility], [address] [place], [place] [address]    Special Requests   Final    BOTTLES DRAWN AEROBIC AND ANAEROBIC Blood Culture adequate volume Performed at [facility], [address] [place], [place] [address]    Culture   Final    NO GROWTH 2 DAYS Performed at [facility], [address] [place], [place] [address]    Report Status PENDING  Incomplete  Urine culture     Status: Abnormal   Collection Time: 06/22/20 10:24 PM   Specimen: In/Out Cath Urine  Result Value Ref Range Status   Specimen Description   Final    IN/OUT CATH URINE Performed at  [facility], [address] [place], [place] [address]    Special Requests   Final    NONE Performed at [facility], [address] [place], [place] [address]    Culture MULTIPLE SPECIES PRESENT, SUGGEST RECOLLECTION (A)  Final   Report Status 06/23/2020 FINAL  Final  MRSA PCR Screening     Status: None   Collection Time: 06/23/20 10:46 AM   Specimen: Nasal Mucosa; Nasopharyngeal  Result Value Ref Range Status   MRSA by PCR NEGATIVE NEGATIVE Final    Comment:        The GeneXpert MRSA Assay (FDA approved for NASAL specimens only), is one component of a comprehensive MRSA colonization surveillance program. It is not intended to diagnose MRSA infection nor to guide or monitor treatment for MRSA infections. Performed at [facility], [address] [place], [place] [address]      Radiology Studies: CT Chest W Contrast  Result Date: 06/22/2020 CLINICAL DATA:  Tachypnea, sacral decubitus ulcer, fever, sepsis EXAM: CT CHEST, ABDOMEN, AND PELVIS WITH CONTRAST TECHNIQUE: Multidetector CT imaging of the chest, abdomen and pelvis was performed following the standard protocol during bolus administration of intravenous contrast. CONTRAST:  100mL OMNIPAQUE IOHEXOL 300 MG/ML  SOLN COMPARISON:  06/22/2020, 09/16/2019, 02/05/2020 FINDINGS: CT CHEST FINDINGS Cardiovascular: The heart and great vessels are unremarkable without pericardial effusion. Normal caliber of the thoracic aorta. Mediastinum/Nodes: No enlarged mediastinal, hilar, or axillary lymph nodes. Thyroid gland, trachea, and esophagus demonstrate no significant findings. Lungs/Pleura: Dependent right lower lobe consolidation could reflect airspace disease or atelectasis. No effusion or pneumothorax. Mucoid material is seen within  the right lower lobe bronchus. No effusion or pneumothorax. Musculoskeletal: No acute or destructive bony lesions. Reconstructed images demonstrate no  additional findings. CT ABDOMEN PELVIS FINDINGS Hepatobiliary: No focal liver abnormality is seen. No gallstones, gallbladder wall thickening, or biliary dilatation. Pancreas: Unremarkable. No pancreatic ductal dilatation or surrounding inflammatory changes. Spleen: Normal in size without focal abnormality. Adrenals/Urinary Tract: There is a punctate calcification layering dependently within the bladder. No other urinary tract calculi or signs of obstruction. The kidneys enhance normally and symmetrically. The adrenals are unremarkable. Stomach/Bowel: No bowel obstruction or ileus. No bowel wall thickening or inflammatory change. Vascular/Lymphatic: No significant vascular findings are present. No enlarged abdominal or pelvic lymph nodes. Reproductive: Prostate is unremarkable. Other: No free fluid or free gas. No abdominal wall hernia. Musculoskeletal: Persistent decubitus ulceration overlying the distal sacrum and coccyx. Since the prior exam, erosive changes are seen within the dorsal aspect of the lower sacrum at approximately the S3 and S4 levels. No periosteal reaction. Bilateral L5 pars defects and grade 2 anterolisthesis of L5 on S1 unchanged. Stable chronic compression deformities involving the superior endplates of the L3 and L4 vertebral bodies. Severe spondylosis at L5/S1 unchanged. Reconstructed images demonstrate no additional findings. IMPRESSION: 1. Dependent right lower lobe consolidation with mucoid material in the right lower lobe bronchus. Findings could reflect mucous plugging, aspiration, or infection. 2. Persistent decubitus ulceration overlying the distal sacrum and coccyx. Since the prior exam, erosive changes within the dorsal aspect of the lower sacrum at approximately the S3 and S4 levels, consistent with chronic osteomyelitis. 3. Punctate nonobstructing calculus within the bladder lumen. Electronically Signed   By: Sharlet Salina M.D.   On: 06/22/2020 21:46   CT ABDOMEN PELVIS W  CONTRAST  Result Date: 06/22/2020 CLINICAL DATA:  Tachypnea, sacral decubitus ulcer, fever, sepsis EXAM: CT CHEST, ABDOMEN, AND PELVIS WITH CONTRAST TECHNIQUE: Multidetector CT imaging of the chest, abdomen and pelvis was performed following the standard protocol during bolus administration of intravenous contrast. CONTRAST:  OMNIPAQUE IOHEXOL 300 MG/ML  SOLN COMPARISON:  06/22/2020, 09/16/2019, 02/05/2020 FINDINGS: CT CHEST FINDINGS Cardiovascular: The heart and great vessels are unremarkable without pericardial effusion. Normal caliber of the thoracic aorta. Mediastinum/Nodes: No enlarged mediastinal, hilar, or axillary lymph nodes. Thyroid gland, trachea, and esophagus demonstrate no significant findings. Lungs/Pleura: Dependent right lower lobe consolidation could reflect airspace disease or atelectasis. No effusion or pneumothorax. Mucoid material is seen within the right lower lobe bronchus. No effusion or pneumothorax. Musculoskeletal: No acute or destructive bony lesions. Reconstructed images demonstrate no additional findings. CT ABDOMEN PELVIS FINDINGS Hepatobiliary: No focal liver abnormality is seen. No gallstones, gallbladder wall thickening, or biliary dilatation. Pancreas: Unremarkable. No pancreatic ductal dilatation or surrounding inflammatory changes. Spleen: Normal in size without focal abnormality. Adrenals/Urinary Tract: There is a punctate calcification layering dependently within the bladder. No other urinary tract calculi or signs of obstruction. The kidneys enhance normally and symmetrically. The adrenals are unremarkable. Stomach/Bowel: No bowel obstruction or ileus. No bowel wall thickening or inflammatory change. Vascular/Lymphatic: No significant vascular findings are present. No enlarged abdominal or pelvic lymph nodes. Reproductive: Prostate is unremarkable. Other: No free fluid or free gas. No abdominal wall hernia. Musculoskeletal: Persistent decubitus ulceration overlying  the distal sacrum and coccyx. Since the prior exam, erosive changes are seen within the dorsal aspect of the lower sacrum at approximately the S3 and S4 levels. No periosteal reaction. Bilateral L5 pars defects and grade 2 anterolisthesis of L5 on S1 unchanged. Stable chronic compression deformities involving the  superior endplates of the L3 and L4 vertebral bodies. Severe spondylosis at L5/S1 unchanged. Reconstructed images demonstrate no additional findings. IMPRESSION: 1. Dependent right lower lobe consolidation with mucoid material in the right lower lobe bronchus. Findings could reflect mucous plugging, aspiration, or infection. 2. Persistent decubitus ulceration overlying the distal sacrum and coccyx. Since the prior exam, erosive changes within the dorsal aspect of the lower sacrum at approximately the S3 and S4 levels, consistent with chronic osteomyelitis. 3. Punctate nonobstructing calculus within the bladder lumen. Electronically Signed   By: Sharlet Salina M.D.   On: 06/22/2020 21:46   DG Swallowing Func-Speech Pathology  Result Date: 06/24/2020 Objective Swallowing Evaluation: Type of Study: MBS-Modified Barium Swallow Study  Patient Details Name: Tracy Todd MRN: [id number] Date of Birth: 1966/01/04 Today's Date: 06/24/2020 Time: SLP Start Time (ACUTE ONLY): 1305 -SLP Stop Time (ACUTE ONLY): 1335 SLP Time Calculation (min) (ACUTE ONLY): 30 min Past Medical History: Past Medical History: Diagnosis Date  Arthritis   Down's syndrome   Headache   Hyperlipidemia   Iron deficiency anemia   Recurrent syncope   Seizure disorder (HCC) 05/21/2020  Seizures (HCC)  Past Surgical History: No past surgical history on file. HPI: 54yo male admitted 06/22/20 with fever and cough with cxr concerning for aspiration pna. PMH: seizures, recurrent syncope, HLD, iron deficiency anemia, Down Syndrome, nonverbal, bedbound last 7-8 months with steady and chronic decline in function.  Pt underwent BSE with recommendation  for MBS due to pt's pna and coughing post-swallow of minimal intake he would accept.  Subjective: pt awake in chair, required repositioning for view Assessment / Plan / Recommendation CHL IP CLINICAL IMPRESSIONS 06/24/2020 Clinical Impression Mild oropharyngeal dysphagia without aspiration or laryngeal penetration to vocal cord with any consistency tested.  Of note, pt did NOT cough during the study = which he is clinically conducting after his intake per notes from BSE.  Pt does present with delayed oral transiting, lingual pumping with foods requiring mastication due to discoordination.  Pharyngeal swallow was marginally weak with decreased tongue base retraction which allows minimal retention of solids without explicit awareness.  Secretions also appeared retained in pharynx and mixed with retained barium.  Recommend pt continue regular/thin diet and medications with pudding - whole.  Advise pt start all intake with liquids and drink liquids during meal.  Sister present during the pt's MBS and was informed to findings/recommendations.  Since positioning is an issue for this pt, advise to consider reverse [place] for optimal safety with intake.  Will follow up briefly for education/po tolerance.  Advised pt's sister to mitigation strategies for this pt as he is unable to follow directions for strengthening exercises, etc. Reviewed MBS findings with pt's sister including pt's appearance of osteophytes along with deconditioning possibly impacting epiglottic deflection, contributing to dysphagia.  SLP Visit Diagnosis Dysphagia, oropharyngeal phase (R13.12) Attention and concentration deficit following -- Frontal lobe and executive function deficit following -- Impact on safety and function Mild aspiration risk   CHL IP TREATMENT RECOMMENDATION 06/24/2020 Treatment Recommendations Therapy as outlined in treatment plan below   Prognosis 06/24/2020 Prognosis for Safe Diet Advancement Fair Barriers to Reach Goals  Cognitive deficits Barriers/Prognosis Comment -- CHL IP DIET RECOMMENDATION 06/24/2020 SLP Diet Recommendations Regular solids;Thin liquid Liquid Administration via Cup;Straw Medication Administration Whole meds with puree Compensations Minimize environmental distractions;Slow rate;Small sips/bites Postural Changes Seated upright at 90 degrees;Remain semi-upright after after feeds/meals (Comment)   CHL IP OTHER RECOMMENDATIONS 06/24/2020 Recommended Consults -- Oral Care Recommendations Oral care QID  Other Recommendations --   CHL IP FOLLOW UP RECOMMENDATIONS 06/24/2020 Follow up Recommendations 24 hour supervision/assistance   CHL IP FREQUENCY AND DURATION 06/24/2020 Speech Therapy Frequency (ACUTE ONLY) -- Treatment Duration 1 week;2 weeks      CHL IP ORAL PHASE 06/24/2020 Oral Phase WFL Oral - Pudding Teaspoon -- Oral - Pudding Cup -- Oral - Honey Teaspoon -- Oral - Honey Cup -- Oral - Nectar Teaspoon -- Oral - Nectar Cup -- Oral - Nectar Straw WFL Oral - Thin Teaspoon WFL Oral - Thin Cup -- Oral - Thin Straw WFL Oral - Puree WFL Oral - Mech Soft Premature spillage;Weak lingual manipulation;Lingual pumping;Reduced posterior propulsion;Delayed oral transit Oral - Regular -- Oral - Multi-Consistency -- Oral - Pill WFL Oral Phase - Comment --  CHL IP PHARYNGEAL PHASE 06/24/2020 Pharyngeal Phase WFL Pharyngeal- Pudding Teaspoon -- Pharyngeal -- Pharyngeal- Pudding Cup -- Pharyngeal -- Pharyngeal- Honey Teaspoon -- Pharyngeal -- Pharyngeal- Honey Cup -- Pharyngeal -- Pharyngeal- Nectar Teaspoon -- Pharyngeal -- Pharyngeal- Nectar Cup -- Pharyngeal -- Pharyngeal- Nectar Straw WFL Pharyngeal Material does not enter airway Pharyngeal- Thin Teaspoon WFL Pharyngeal Material does not enter airway Pharyngeal- Thin Cup -- Pharyngeal -- Pharyngeal- Thin Straw Penetration/Aspiration during swallow Pharyngeal Material enters airway, remains ABOVE vocal cords then ejected out Pharyngeal- Puree WFL Pharyngeal Material does not enter  airway Pharyngeal- Mechanical Soft Delayed swallow initiation-vallecula;Pharyngeal residue - valleculae;Reduced epiglottic inversion;Reduced tongue base retraction Pharyngeal Material does not enter airway Pharyngeal- Regular -- Pharyngeal -- Pharyngeal- Multi-consistency -- Pharyngeal -- Pharyngeal- Pill WFL Pharyngeal Material does not enter airway Pharyngeal Comment --  CHL IP CERVICAL ESOPHAGEAL PHASE 06/24/2020 Cervical Esophageal Phase WFL Pudding Teaspoon -- Pudding Cup -- Honey Teaspoon -- Honey Cup -- Nectar Teaspoon -- Nectar Cup -- Nectar Straw -- Thin Teaspoon -- Thin Cup -- Thin Straw -- Puree -- Mechanical Soft -- Regular -- Multi-consistency -- Pill -- Cervical Esophageal Comment WFL [name], MS [facility] SLP Acute Rehab Services Office [phone] Pager [phone] [name] 06/24/2020, 2:04 PM               Scheduled Meds:  aspirin EC  81 mg Oral Daily   feeding supplement  237 mL Oral BID BM   feeding supplement (PRO-STAT SUGAR FREE 64)  30 mL Oral BID   finasteride  5 mg Oral Daily   heparin  5,000 Units Subcutaneous Q8H   iron polysaccharides  150 mg Oral Daily   levETIRAcetam  500 mg Oral BID   loratadine  10 mg Oral Daily   nutrition supplement (JUVEN)  1 packet Oral BID BM   pravastatin  40 mg Oral Daily   vitamin B-12  500 mcg Oral Daily   zinc sulfate  220 mg Oral Daily   Continuous Infusions:   ceFAZolin (ANCEF) IV 2 g (06/24/20 1700)   metronidazole 500 mg (06/24/20 1347)     LOS: 2 days   Rickey Barbara, MD [facility] Pager On Amion  If 7PM-7AM, please contact night-coverage 06/24/2020, 6:23 PM

## 2020-06-24 NOTE — Progress Notes (Signed)
Modified Barium Swallow Progress Note  Patient Details  Name: Tracy Todd MRN: [id number] Date of Birth: 03-Mar-1966  Today's Date: 06/24/2020  Modified Barium Swallow completed.  Full report located under Chart Review in the Imaging Section.  Brief recommendations include the following:  Clinical Impression  Mild oropharyngeal dysphagia without aspiration or laryngeal penetration to vocal cord with any consistency tested.  Of note, pt did NOT cough during the study = which he is clinically conducting after his intake per notes from BSE.  Pt does present with delayed oral transiting, lingual pumping with foods requiring mastication due to discoordination.  Pharyngeal swallow was marginally weak with decreased tongue base retraction which allows minimal retention of solids without explicit awareness.  Secretions also appeared retained in pharynx and mixed with retained barium.    Recommend pt continue regular/thin diet and medications with pudding - whole.  Advise pt start all intake with liquids and drink liquids during meal.  Sister present during the pt's MBS and was informed to findings/recommendations.  Since positioning is an issue for this pt, advise to consider reverse trendeleburg for optimal safety with intake.    Will follow up briefly for education/po tolerance.  Advised pt's sister to mitigation strategies for this pt as he is unable to follow directions for strengthening exercises, etc. Reviewed MBS findings with pt's sister including pt's appearance of osteophytes along with deconditioning possibly impacting epiglottic deflection, contributing to dysphagia.    Swallow Evaluation Recommendations       SLP Diet Recommendations: Regular solids;Thin liquid   Liquid Administration via: Cup;Straw   Medication Administration: Whole meds with puree (start and follow with liquids)   Supervision: Full supervision/cueing for compensatory strategies   Compensations: Minimize  environmental distractions;Slow rate;Small sips/bites (start intake with liquids, liquids t/o meal - prefer water)   Postural Changes: Seated upright at 90 degrees;Remain semi-upright after after feeds/meals (Comment) (reverse trendeleburg)   Oral Care Recommendations: Oral care QID       [name], MS [facility] SLP Acute Rehab Services Office [phone] Pager [phone]   [name] 06/24/2020,2:01 PM

## 2020-06-25 ENCOUNTER — Inpatient Hospital Stay (HOSPITAL_COMMUNITY): Payer: Medicare Other

## 2020-06-25 ENCOUNTER — Ambulatory Visit (HOSPITAL_COMMUNITY): Payer: Medicare Other

## 2020-06-25 DIAGNOSIS — E870 Hyperosmolality and hypernatremia: Secondary | ICD-10-CM | POA: Diagnosis not present

## 2020-06-25 DIAGNOSIS — E1169 Type 2 diabetes mellitus with other specified complication: Secondary | ICD-10-CM | POA: Diagnosis not present

## 2020-06-25 DIAGNOSIS — M869 Osteomyelitis, unspecified: Secondary | ICD-10-CM | POA: Diagnosis not present

## 2020-06-25 LAB — COMPREHENSIVE METABOLIC PANEL
ALT: 18 U/L (ref 0–44)
AST: 19 U/L (ref 15–41)
Albumin: 2.2 g/dL — ABNORMAL LOW (ref 3.5–5.0)
Alkaline Phosphatase: 51 U/L (ref 38–126)
Anion gap: 8 (ref 5–15)
BUN: 19 mg/dL (ref 6–20)
CO2: 28 mmol/L (ref 22–32)
Calcium: 7.7 mg/dL — ABNORMAL LOW (ref 8.9–10.3)
Chloride: 105 mmol/L (ref 98–111)
Creatinine, Ser: 0.51 mg/dL — ABNORMAL LOW (ref 0.61–1.24)
GFR, Estimated: >60 mL/min (ref >60)
Glucose, Bld: 96 mg/dL (ref 70–99)
Potassium: 3.2 mmol/L — ABNORMAL LOW (ref 3.5–5.1)
Sodium: 141 mmol/L (ref 135–145)
Total Bilirubin: 0.4 mg/dL (ref 0.3–1.2)
Total Protein: 5 g/dL — ABNORMAL LOW (ref 6.5–8.1)

## 2020-06-25 LAB — CBC
HCT: 36.6 % — ABNORMAL LOW (ref 39.0–52.0)
Hemoglobin: 10.9 g/dL — ABNORMAL LOW (ref 13.0–17.0)
MCH: 31.1 pg (ref 26.0–34.0)
MCHC: 29.8 g/dL — ABNORMAL LOW (ref 30.0–36.0)
MCV: 104.6 fL — ABNORMAL HIGH (ref 80.0–100.0)
Platelets: 264 10*3/uL (ref 150–400)
RBC: 3.5 MIL/uL — ABNORMAL LOW (ref 4.22–5.81)
RDW: 16.3 % — ABNORMAL HIGH (ref 11.5–15.5)
WBC: 6.8 10*3/uL (ref 4.0–10.5)
nRBC: 0 % (ref 0.0–0.2)

## 2020-06-25 MED ORDER — POTASSIUM CHLORIDE 10 MEQ/100ML IV SOLN: 10.0000 meq | INTRAVENOUS | Status: DC

## 2020-06-25 MED ORDER — MIDAZOLAM HCL 2 MG/2ML IJ SOLN
INTRAMUSCULAR | Status: AC
  Filled 2020-06-25: qty 2

## 2020-06-25 MED ORDER — LIDOCAINE HCL (PF) 1 % IJ SOLN
INTRAMUSCULAR | Status: AC | PRN
  Administered 2020-06-25: 5 mL

## 2020-06-25 MED ORDER — METRONIDAZOLE 500 MG PO TABS
500.0000 mg | ORAL_TABLET | Freq: Three times a day (TID) | ORAL | Status: DC
  Administered 2020-06-25 – 2020-06-30 (×16): 500 mg via ORAL
  Filled 2020-06-25 (×16): qty 1

## 2020-06-25 MED ORDER — MIDAZOLAM HCL 2 MG/2ML IJ SOLN
INTRAMUSCULAR | Status: AC | PRN
  Administered 2020-06-25 (×2): 0.5 mg via INTRAVENOUS

## 2020-06-25 MED ORDER — CEFAZOLIN SODIUM-DEXTROSE 2-4 GM/100ML-% IV SOLN
2.0000 g | Freq: Three times a day (TID) | INTRAVENOUS | Status: DC
  Administered 2020-06-25 – 2020-06-30 (×17): 2 g via INTRAVENOUS
  Filled 2020-06-25 (×19): qty 100

## 2020-06-25 MED ORDER — LIDOCAINE HCL (PF) 1 % IJ SOLN
INTRAMUSCULAR | Status: AC
  Filled 2020-06-25: qty 30

## 2020-06-25 MED ORDER — POTASSIUM CHLORIDE CRYS ER 20 MEQ PO TBCR
40.0000 meq | EXTENDED_RELEASE_TABLET | Freq: Two times a day (BID) | ORAL | Status: AC
  Administered 2020-06-25 (×2): 40 meq via ORAL
  Filled 2020-06-25 (×2): qty 2

## 2020-06-25 MED ORDER — POTASSIUM CHLORIDE CRYS ER 20 MEQ PO TBCR
40.0000 meq | EXTENDED_RELEASE_TABLET | Freq: Two times a day (BID) | ORAL | Status: DC
  Administered 2020-06-25: 40 meq via ORAL
  Filled 2020-06-25: qty 2

## 2020-06-25 MED ORDER — CEFAZOLIN SODIUM-DEXTROSE 2-4 GM/100ML-% IV SOLN: 2.0000 g | Freq: Three times a day (TID) | INTRAVENOUS | Status: DC

## 2020-06-25 NOTE — Progress Notes (Signed)
SLP Cancellation Note  Patient Details Name: Tracy Todd MRN: [id number] DOB: July 24, 1966   Cancelled treatment:       Reason Eval/Treat Not Completed: Other (comment) (pt npo for procedure in IR, will continue efforts after MBS yesterday to assure tolerance of po diet and for education)  [name], MS [facility] SLP Acute Rehab Services Office [phone] Pager [phone]   [name] 06/25/2020, 12:18 PM

## 2020-06-25 NOTE — Progress Notes (Signed)
PHARMACY CONSULT NOTE FOR:  OUTPATIENT  PARENTERAL ANTIBIOTIC THERAPY (OPAT)  Indication: Sacrococcygeal osteomyelitis  Regimen: Cefazolin 2 gm IV q 8 hours + Metronidazole PO 500 mg Q 8 hours End date: 08/06/2020- Has ID f/u appointment this day   IV antibiotic discharge orders are pended. To discharging provider:  please sign these orders via discharge navigator,  Select New Orders & click on the button choice - Manage This Unsigned Work.     Thank you for allowing pharmacy to be a part of this patient's care.  Sharin Mons, PharmD, BCPS, BCIDP Infectious Diseases Clinical Pharmacist Phone: [phone] 06/25/2020, 10:16 AM

## 2020-06-25 NOTE — Progress Notes (Signed)
PHARMACY NOTE:  ANTIMICROBIAL RENAL DOSAGE ADJUSTMENT  Current antimicrobial regimen includes a mismatch between antimicrobial dosage and estimated renal function.  As per policy approved by the Pharmacy & Therapeutics and Medical Executive Committees, the antimicrobial dosage will be adjusted accordingly.  Current antimicrobial dosage:  Cefazolin 2 gm IV q 12 hours   Indication: Osteomyelitis   Renal Function:  Estimated Creatinine Clearance: 60.2 mL/min (A) (by C-G formula based on SCr of 0.51 mg/dL (L)). []      On intermittent HD, scheduled: []      On CRRT    Antimicrobial dosage has been changed to: Cefazolin 2 gm IV q 8 hours   Additional comments:   Thank you for allowing pharmacy to be a part of this patient's care.  , PharmD, BCPS, BCIDP Infectious Diseases Clinical Pharmacist Phone: [phone] 06/25/2020 10:02 AM

## 2020-06-25 NOTE — Plan of Care (Signed)

## 2020-06-25 NOTE — TOC Progression Note (Addendum)
Transition of Care [facility]) - Progression Note    Patient Details  Name: Tracy Todd MRN: [id number] Date of Birth: 18-Apr-1966  Transition of Care [facility]) CM/SW Contact  Golda Acre, RN Phone Number: 06/25/2020, 1:46 PM  CNeed passar number waiting on updated information in chart by md and PT eval;. [phone]    [phone] passar number waiting on updated information in chart by md and PT eval;.please fax to passar.  Sister lisa wanted patient to go to snf.  [facility] is preferred.  Fl2 started gone to 2nd level review. Information reuqested. passar has requested updated information due to down syndrome of the patient,  Pt eval has been ordered and awaiting updated notes by Dr. Rhona Leavens. Please send to passar.  Expected Discharge Plan: Home w Home Health Services Barriers to Discharge: No Barriers Identified  Expected Discharge Plan and Services Expected Discharge Plan: Home w Home Health Services       Living arrangements for the past 2 months: Single Family Home                                       Social Determinants of Health (SDOH) Interventions    Readmission Risk Interventions No flowsheet data found.

## 2020-06-25 NOTE — Progress Notes (Signed)
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  Patient was followed by infectious disease clinic and supposed to have a PICC line placement and 6 weeks of IV antibiotics.  Currently on oral antibiotics with cefdinir and Flagyl. In the emergency room, temperature 99.7.  Heart rate 102.  Blood pressures fairly normal.  89% on room air. Lactic acid 1.5.  CT scan of the chest abdomen pelvis showed right lower lobe consolidation with mucoid material on the right lower lobe bronchus.  Decubitus ulceration, erosive changes of the sacrum.  Patient was treated as sepsis and admitted to the hospital.  Assessment & Plan:   Active Problems:   Pressure injury of skin   Sepsis (HCC)   Sepsis secondary to right lower lobe pneumonia with hypoxia:  -Suspect aspiration with both gram-positive and gram-negative bacteremia. -Pt had been on cefdinir and Flagyl as outpatient.   -Blood cultures were drawn and initially treated with cefepime and vancomycin -Oxygen to keep saturations more than 90%. -Patient is unable to follow any commands or instructions to do chest physiotherapy.   -SLP consulted and pt is now s/p modified barium swallow. Rec for regular solids with thin liquids -See below, continued on ancef and flagyl  Chronic decubitus ulcer/sacral osteomyelitis: Present on admission.  Stage IV sacral decubitus. -Blood cx confirmed neg -PICC placed by IR for long-term abx, placed on 11/4 -Now on ancef plus flagyl per ID recs  Hyperlipidemia:  -Continue statin as tolerated  Seizure disorder:    -Remains stable on Keppra.  Down syndrome/bedbound status/contractures and incontinence:  -Lives at home with family. -Plan for SNF placement. TOC following  Hypernatremia:  -Due to free water deficit.  Treated with isotonic fluid, normalized Repeat bmet in AM  Hypokalemia: -Replaced -Repeat levels in AM   DVT prophylaxis: Heparin subq Code Status: Full Family Communication: Pt in room  Status is: Inpatient  Remains inpatient appropriate because:Ongoing diagnostic testing needed not appropriate for outpatient work up, Unsafe d/c plan and IV treatments appropriate due to intensity of illness or inability to take PO   Dispo: The patient is from: Home              Anticipated d/c is to: Home              Anticipated d/c date is: 2 days              Patient currently is not medically stable to d/c.  Consultants:   ID  IR  Procedures:   PICC placed under anesthesia 11/4 by IR  Antimicrobials: Anti-infectives (From admission, onward)   Start     Dose/Rate Route Frequency Ordered Stop   06/25/20 2200  ceFAZolin (ANCEF) IVPB 2g/100 mL premix  Status:  Discontinued        2 g 200 mL/hr over 30 Minutes Intravenous Every 8 hours 06/25/20 1004 06/25/20 1005   06/25/20 1500  metroNIDAZOLE (FLAGYL) tablet 500 mg        500 mg Oral Every 8 hours 06/25/20 1405     06/25/20 1100  ceFAZolin (ANCEF) IVPB 2g/100 mL premix        2 g 200  mL/hr over 30 Minutes Intravenous Every 8 hours 06/25/20 1005     06/24/20 1800  vancomycin (VANCOREADY) IVPB 750 mg/150 mL  Status:  Discontinued        750 mg 150 mL/hr over 60 Minutes Intravenous Every 24 hours 06/24/20 0938 06/24/20 1205   06/24/20 1700  ceFAZolin (ANCEF) IVPB 2g/100 mL premix  Status:  Discontinued        2 g 200 mL/hr over 30 Minutes Intravenous Every 12 hours 06/24/20 1232 06/25/20 1004   06/24/20 1300  metroNIDAZOLE (FLAGYL) IVPB 500 mg  Status:  Discontinued        500 mg 100 mL/hr over 60 Minutes Intravenous Every  8 hours 06/24/20 1205 06/25/20 1405   06/23/20 1800  vancomycin (VANCOCIN) IVPB 1000 mg/200 mL premix  Status:  Discontinued        1,000 mg 200 mL/hr over 60 Minutes Intravenous Every 24 hours 06/23/20 0325 06/24/20 0938   06/23/20 1000  levofloxacin (LEVAQUIN) tablet 750 mg  Status:  Discontinued        750 mg Oral Daily 06/22/20 2228 06/22/20 2228   06/23/20 0600  ceFEPIme (MAXIPIME) 2 g in sodium chloride 0.9 % 100 mL IVPB  Status:  Discontinued        2 g 200 mL/hr over 30 Minutes Intravenous Every 12 hours 06/23/20 0325 06/24/20 1205   06/22/20 1715  aztreonam (AZACTAM) 2 g in sodium chloride 0.9 % 100 mL IVPB  Status:  Discontinued        2 g 200 mL/hr over 30 Minutes Intravenous  Once 06/22/20 1700 06/22/20 1712   06/22/20 1715  metroNIDAZOLE (FLAGYL) IVPB 500 mg        500 mg 100 mL/hr over 60 Minutes Intravenous  Once 06/22/20 1700 06/22/20 2001   06/22/20 1715  vancomycin (VANCOCIN) IVPB 1000 mg/200 mL premix        1,000 mg 200 mL/hr over 60 Minutes Intravenous  Once 06/22/20 1700 06/22/20 2029   06/22/20 1715  ceFEPIme (MAXIPIME) 2 g in sodium chloride 0.9 % 100 mL IVPB        2 g 200 mL/hr over 30 Minutes Intravenous  Once 06/22/20 1712 06/22/20 1853      Subjective: Difficult to assess given mentation.  Objective: Vitals:   06/25/20 1421 06/25/20 1425 06/25/20 1430 06/25/20 1435  BP: 117/65 (!) 127/54 (!) 127/55 (!) 127/57  Pulse: 78 77 74 73  Resp: 16 18 18 17   Temp:      TempSrc:      SpO2: 100% 100% 100% 100%  Weight:      Height:       No intake or output data in the 24 hours ending 06/25/20 1627 Filed Weights   06/22/20 1713  Weight: 40.8 kg    Examination: General exam: Awake, laying in bed, in nad Respiratory system: Normal respiratory effort, no wheezing Cardiovascular system: regular rate, s1, s2 Gastrointestinal system: Soft, nondistended, positive BS Central nervous system: CN2-12 grossly intact, strength intact Extremities: Perfused, no  clubbing Skin: Normal skin turgor, no notable skin lesions seen Psychiatry: Difficult to assess given mentation  Data Reviewed: I have personally reviewed following labs and imaging studies  CBC: Recent Labs  Lab 06/22/20 1700 06/23/20 0511 06/25/20 0613  WBC 7.5 6.8 6.8  NEUTROABS 6.3  --   --   HGB 13.1 10.5* 10.9*  HCT 45.7 36.9* 36.6*  MCV 105.5* 107.0* 104.6*  PLT 438* 285 264   Basic Metabolic Panel: Recent Labs  Lab 06/22/20 1700 06/23/20 0511 06/25/20 0613  NA 152* 145 141  K 3.5 3.7 3.2*  CL 114* 110 105  CO2 26 28 28   GLUCOSE 130* 101* 96  BUN 41* 21* 19  CREATININE 0.90 0.68 0.51*  CALCIUM 8.4* 7.9* 7.7*   GFR: Estimated Creatinine Clearance: 60.2 mL/min (A) (by C-G formula based on SCr of 0.51 mg/dL (L)). Liver Function Tests: Recent Labs  Lab 06/22/20 1700 06/23/20 0511 06/25/20 0613  AST 59* 32 19  ALT 45* 29 18  ALKPHOS 94 62 51  BILITOT 0.7 0.7 0.4  PROT 6.9 5.1* 5.0*  ALBUMIN 2.9* 2.2* 2.2*   No results for input(s): LIPASE, AMYLASE in the last 168 hours. No results for input(s): AMMONIA in the last 168 hours. Coagulation Profile: Recent Labs  Lab 06/22/20 1700 06/23/20 0511  INR 1.7* 1.7*   Cardiac Enzymes: No results for input(s): CKTOTAL, CKMB, CKMBINDEX, TROPONINI in the last 168 hours. BNP (last 3 results) No results for input(s): PROBNP in the last 8760 hours. HbA1C: No results for input(s): HGBA1C in the last 72 hours. CBG: No results for input(s): GLUCAP in the last 168 hours. Lipid Profile: No results for input(s): CHOL, HDL, LDLCALC, TRIG, CHOLHDL, LDLDIRECT in the last 72 hours. Thyroid Function Tests: No results for input(s): TSH, T4TOTAL, FREET4, T3FREE, THYROIDAB in the last 72 hours. Anemia Panel: No results for input(s): VITAMINB12, FOLATE, FERRITIN, TIBC, IRON, RETICCTPCT in the last 72 hours. Sepsis Labs: Recent Labs  Lab 06/22/20 1700 06/22/20 1855 06/23/20 0511  PROCALCITON  --   --  0.19  LATICACIDVEN  1.5 1.9 2.1*    Recent Results (from the past 240 hour(s))  Blood Culture (routine x 2)     Status: None (Preliminary result)   Collection Time: 06/22/20  5:55 PM   Specimen: BLOOD  Result Value Ref Range Status   Specimen Description   Final    BLOOD RIGHT ANTECUBITAL Performed at [facility], [address], [place], [place]    Special Requests   Final    BOTTLES DRAWN AEROBIC AND ANAEROBIC Blood Culture results may not be optimal due to an inadequate volume of blood received in culture bottles Performed at [facility], [address], [place], [place]    Culture   Final    NO GROWTH 3 DAYS Performed at [facility], [address] [place], [place]    Report Status PENDING  Incomplete  Respiratory Panel by RT PCR (Flu A&B, Covid) - Nasopharyngeal Swab     Status: None   Collection Time: 06/22/20  5:55 PM   Specimen: Nasopharyngeal Swab  Result Value Ref Range Status   SARS Coronavirus 2 by RT PCR NEGATIVE NEGATIVE Final    Comment: (NOTE) SARS-CoV-2 target nucleic acids are NOT DETECTED.  The SARS-CoV-2 RNA is generally detectable in upper respiratoy specimens during the acute phase of infection. The lowest concentration of SARS-CoV-2 viral copies this assay can detect is 131 copies/mL. A negative result does not preclude SARS-Cov-2 infection and should not be used as the sole basis for treatment or other patient management decisions. A negative result may occur with  improper specimen collection/handling, submission of specimen other than nasopharyngeal swab, presence of viral mutation(s) within the areas targeted by this assay, and inadequate number of viral copies (<131 copies/mL). A negative result must be combined with clinical observations, patient history, and epidemiological information. The expected result is Negative.  Fact Sheet  for Patients:   https://www.moore.com/  Fact Sheet for Healthcare Providers:  https://www.young.biz/  This test is no t yet approved or cleared by the [place] FDA and  has been authorized for detection and/or diagnosis of SARS-CoV-2 by FDA under an Emergency Use Authorization (EUA). This EUA will remain  in effect (meaning this test can be used) for the duration of the COVID-19 declaration under Section 564(b)(1) of the Act, 21 U.S.C. section 360bbb-3(b)(1), unless the authorization is terminated or revoked sooner.     Influenza A by PCR NEGATIVE NEGATIVE Final   Influenza B by PCR NEGATIVE NEGATIVE Final    Comment: (NOTE) The Xpert Xpress SARS-CoV-2/FLU/RSV assay is intended as an aid in  the diagnosis of influenza from Nasopharyngeal swab specimens and  should not be used as a sole basis for treatment. Nasal washings and  aspirates are unacceptable for Xpert Xpress SARS-CoV-2/FLU/RSV  testing.  Fact Sheet for Patients: https://www.moore.com/  Fact Sheet for Healthcare Providers: https://www.young.biz/  This test is not yet approved or cleared by the [place] FDA and  has been authorized for detection and/or diagnosis of SARS-CoV-2 by  FDA under an Emergency Use Authorization (EUA). This EUA will remain  in effect (meaning this test can be used) for the duration of the  Covid-19 declaration under Section 564(b)(1) of the Act, 21  U.S.C. section 360bbb-3(b)(1), unless the authorization is  terminated or revoked. Performed at [facility], [address], [place], [place] [address]   Blood Culture (routine x 2)     Status: None (Preliminary result)   Collection Time: 06/22/20  6:00 PM   Specimen: BLOOD  Result Value Ref Range Status   Specimen Description   Final    BLOOD LEFT ANTECUBITAL Performed at [facility], [address], [place], [place] [address]     Special Requests   Final    BOTTLES DRAWN AEROBIC AND ANAEROBIC Blood Culture adequate volume Performed at [facility], [address] [place], [place] [address]    Culture   Final    NO GROWTH 3 DAYS Performed at [facility], [address] [place], [place] [address]    Report Status PENDING  Incomplete  Urine culture     Status: Abnormal   Collection Time: 06/22/20 10:24 PM   Specimen: In/Out Cath Urine  Result Value Ref Range Status   Specimen Description   Final    IN/OUT CATH URINE Performed at [facility], [address], [place], [place] [address]    Special Requests   Final    NONE Performed at [facility], [address], [place], [place] [address]    Culture MULTIPLE SPECIES PRESENT, SUGGEST RECOLLECTION (A)  Final   Report Status 06/23/2020 FINAL  Final  MRSA PCR Screening     Status: None   Collection Time: 06/23/20 10:46 AM   Specimen: Nasal Mucosa; Nasopharyngeal  Result Value Ref Range Status   MRSA by PCR NEGATIVE NEGATIVE Final    Comment:        The GeneXpert MRSA Assay (FDA approved for NASAL specimens only), is one component of a comprehensive MRSA colonization surveillance program. It is not intended to diagnose MRSA infection nor to guide or monitor treatment for MRSA infections. Performed at [facility], [address], [place], [place] [address]      Radiology Studies: DG Swallowing Func-Speech Pathology  Result Date: 06/24/2020 Objective Swallowing Evaluation: Type of  Study: MBS-Modified Barium Swallow Study  Patient Details Name: Mozes Sagar MRN: [id number] Date of Birth: 08-03-1966 Today's Date: 06/24/2020 Time: SLP Start Time (ACUTE ONLY): 1305 -SLP Stop Time (ACUTE ONLY): 1335 SLP Time Calculation (min) (ACUTE ONLY): 30 min Past Medical History: Past Medical History: Diagnosis Date . Arthritis  . Down's syndrome  . Headache  . Hyperlipidemia  . Iron  deficiency anemia  . Recurrent syncope  . Seizure disorder (HCC) 05/21/2020 . Seizures (HCC)  Past Surgical History: No past surgical history on file. HPI: 54yo male admitted 06/22/20 with fever and cough with cxr concerning for aspiration pna. PMH: seizures, recurrent syncope, HLD, iron deficiency anemia, Down Syndrome, nonverbal, bedbound last 7-8 months with steady and chronic decline in function.  Pt underwent BSE with recommendation for MBS due to pt's pna and coughing post-swallow of minimal intake he would accept.  Subjective: pt awake in chair, required repositioning for view Assessment / Plan / Recommendation CHL IP CLINICAL IMPRESSIONS 06/24/2020 Clinical Impression Mild oropharyngeal dysphagia without aspiration or laryngeal penetration to vocal cord with any consistency tested.  Of note, pt did NOT cough during the study = which he is clinically conducting after his intake per notes from BSE.  Pt does present with delayed oral transiting, lingual pumping with foods requiring mastication due to discoordination.  Pharyngeal swallow was marginally weak with decreased tongue base retraction which allows minimal retention of solids without explicit awareness.  Secretions also appeared retained in pharynx and mixed with retained barium.  Recommend pt continue regular/thin diet and medications with pudding - whole.  Advise pt start all intake with liquids and drink liquids during meal.  Sister present during the pt's MBS and was informed to findings/recommendations.  Since positioning is an issue for this pt, advise to consider reverse [place] for optimal safety with intake.  Will follow up briefly for education/po tolerance.  Advised pt's sister to mitigation strategies for this pt as he is unable to follow directions for strengthening exercises, etc. Reviewed MBS findings with pt's sister including pt's appearance of osteophytes along with deconditioning possibly impacting epiglottic deflection,  contributing to dysphagia.  SLP Visit Diagnosis Dysphagia, oropharyngeal phase (R13.12) Attention and concentration deficit following -- Frontal lobe and executive function deficit following -- Impact on safety and function Mild aspiration risk   CHL IP TREATMENT RECOMMENDATION 06/24/2020 Treatment Recommendations Therapy as outlined in treatment plan below   Prognosis 06/24/2020 Prognosis for Safe Diet Advancement Fair Barriers to Reach Goals Cognitive deficits Barriers/Prognosis Comment -- CHL IP DIET RECOMMENDATION 06/24/2020 SLP Diet Recommendations Regular solids;Thin liquid Liquid Administration via Cup;Straw Medication Administration Whole meds with puree Compensations Minimize environmental distractions;Slow rate;Small sips/bites Postural Changes Seated upright at 90 degrees;Remain semi-upright after after feeds/meals (Comment)   CHL IP OTHER RECOMMENDATIONS 06/24/2020 Recommended Consults -- Oral Care Recommendations Oral care QID Other Recommendations --   CHL IP FOLLOW UP RECOMMENDATIONS 06/24/2020 Follow up Recommendations 24 hour supervision/assistance   CHL IP FREQUENCY AND DURATION 06/24/2020 Speech Therapy Frequency (ACUTE ONLY) -- Treatment Duration 1 week;2 weeks      CHL IP ORAL PHASE 06/24/2020 Oral Phase WFL Oral - Pudding Teaspoon -- Oral - Pudding Cup -- Oral - Honey Teaspoon -- Oral - Honey Cup -- Oral - Nectar Teaspoon -- Oral - Nectar Cup -- Oral - Nectar Straw WFL Oral - Thin Teaspoon WFL Oral - Thin Cup -- Oral - Thin Straw WFL Oral - Puree WFL Oral - Mech Soft Premature spillage;Weak lingual manipulation;Lingual pumping;Reduced posterior propulsion;Delayed oral transit  Oral - Regular -- Oral - Multi-Consistency -- Oral - Pill WFL Oral Phase - Comment --  CHL IP PHARYNGEAL PHASE 06/24/2020 Pharyngeal Phase WFL Pharyngeal- Pudding Teaspoon -- Pharyngeal -- Pharyngeal- Pudding Cup -- Pharyngeal -- Pharyngeal- Honey Teaspoon -- Pharyngeal -- Pharyngeal- Honey Cup -- Pharyngeal -- Pharyngeal- Nectar  Teaspoon -- Pharyngeal -- Pharyngeal- Nectar Cup -- Pharyngeal -- Pharyngeal- Nectar Straw WFL Pharyngeal Material does not enter airway Pharyngeal- Thin Teaspoon WFL Pharyngeal Material does not enter airway Pharyngeal- Thin Cup -- Pharyngeal -- Pharyngeal- Thin Straw Penetration/Aspiration during swallow Pharyngeal Material enters airway, remains ABOVE vocal cords then ejected out Pharyngeal- Puree WFL Pharyngeal Material does not enter airway Pharyngeal- Mechanical Soft Delayed swallow initiation-vallecula;Pharyngeal residue - valleculae;Reduced epiglottic inversion;Reduced tongue base retraction Pharyngeal Material does not enter airway Pharyngeal- Regular -- Pharyngeal -- Pharyngeal- Multi-consistency -- Pharyngeal -- Pharyngeal- Pill WFL Pharyngeal Material does not enter airway Pharyngeal Comment --  CHL IP CERVICAL ESOPHAGEAL PHASE 06/24/2020 Cervical Esophageal Phase WFL Pudding Teaspoon -- Pudding Cup -- Honey Teaspoon -- Honey Cup -- Nectar Teaspoon -- Nectar Cup -- Nectar Straw -- Thin Teaspoon -- Thin Cup -- Thin Straw -- Puree -- Mechanical Soft -- Regular -- Multi-consistency -- Pill -- Cervical Esophageal Comment WFL [name], MS [facility] SLP Acute Rehab Services Office [phone] Pager [phone] [name] 06/24/2020, 2:04 PM              [facility] PLACEMENT LEFT >5 YRS INC IMG GUIDE  Result Date: 06/25/2020 INDICATION: A 54 year old male with down syndrome and acute hematogenous osteomyelitis. He requires PICC line placement for outpatient IV antibiotic therapy. EXAM: PICC LINE PLACEMENT WITH ULTRASOUND AND FLUOROSCOPIC GUIDANCE MEDICATIONS: None. ANESTHESIA/SEDATION: 1 mg Versed given for anxiolysis. FLUOROSCOPY TIME:  Fluoroscopy Time: 0 minutes 18 seconds (1 mGy). COMPLICATIONS: None immediate. PROCEDURE: The patient was advised of the possible risks and complications and agreed to undergo the procedure. The patient was then brought to the angiographic suite for the procedure. The  right/left arm was prepped with chlorhexidine, draped in the usual sterile fashion using maximum barrier technique (cap and mask, sterile gown, sterile gloves, large sterile sheet, hand hygiene and cutaneous antisepsis) and infiltrated locally with 1% Lidocaine. Ultrasound demonstrated patency of the right basilic vein, and this was documented with an image. Under real-time ultrasound guidance, this vein was accessed with a 21 gauge micropuncture needle and image documentation was performed. A 0.018 wire was introduced in to the vein. Over this, a 5 [place] single lumen power-injectable PICC was advanced to the lower SVC/right atrial junction. Fluoroscopy during the procedure and fluoro spot radiograph confirms appropriate catheter position. The catheter was flushed and covered with a sterile dressing. Catheter length: 39 cm IMPRESSION: Successful right arm Power PICC line placement with ultrasound and fluoroscopic guidance. The catheter is ready for use. Electronically Signed   By: Malachy Moan M.D.   On: 06/25/2020 16:07    Scheduled Meds: . aspirin EC  81 mg Oral Daily  . feeding supplement  237 mL Oral BID BM  . feeding supplement (PRO-STAT SUGAR FREE 64)  30 mL Oral BID  . finasteride  5 mg Oral Daily  . heparin  5,000 Units Subcutaneous Q8H  . iron polysaccharides  150 mg Oral Daily  . levETIRAcetam  500 mg Oral BID  . lidocaine (PF)      . loratadine  10 mg Oral Daily  . metroNIDAZOLE  500 mg Oral Q8H  . midazolam      . nutrition supplement (  JUVEN)  1 packet Oral BID BM  . potassium chloride  40 mEq Oral BID  . pravastatin  40 mg Oral Daily  . vitamin B-12  500 mcg Oral Daily  . zinc sulfate  220 mg Oral Daily   Continuous Infusions: .  ceFAZolin (ANCEF) IV 2 g (06/25/20 1100)     LOS: 3 days   Rickey Barbara, MD [facility] Pager On Amion  If 7PM-7AM, please contact night-coverage 06/25/2020, 4:27 PM

## 2020-06-25 NOTE — [place] FL2 (Signed)
[place] MEDICAID FL2 LEVEL OF CARE SCREENING TOOL     IDENTIFICATION  Patient Name: Tracy Todd Birthdate: 03/09/1966 Sex: male Admission Date (Current Location): 06/22/2020  [facility] and [place] Number:  Producer, television/film/video and Address:  [facility],  [address] [place], [place] [address]      Provider Number: [id number]  Attending Physician Name and Address:  Jerald Kief, MD  Relative Name and Phone Number:  Madyx Delfin [phone]    Current Level of Care: Hospital Recommended Level of Care: Skilled Nursing Facility Prior Approval Number:    Date Approved/Denied:   PASRR Number: pending  Discharge Plan: SNF    Current Diagnoses: Patient Active Problem List   Diagnosis Date Noted   Sepsis (HCC) 06/22/2020   Seizure disorder (HCC) 05/21/2020   Pressure injury of skin 02/06/2020   Wound infection 02/05/2020   Iron deficiency anemia 09/17/2019   Elevated troponin 09/17/2019   Syncope 09/16/2019   Down syndrome 12/27/2012   Arthritis 10/25/2011   Hyperlipidemia 10/25/2011    Orientation RESPIRATION BLADDER Height & Weight     Situation, Place, Self  Normal Continent Weight: 40.8 kg Height:  5' (152.4 cm)  BEHAVIORAL SYMPTOMS/MOOD NEUROLOGICAL BOWEL NUTRITION STATUS      Continent Diet (regular)  AMBULATORY STATUS COMMUNICATION OF NEEDS Skin   Extensive Assist Verbally Normal                       Personal Care Assistance Level of Assistance  Bathing, Feeding, Dressing Bathing Assistance: Limited assistance Feeding assistance: Limited assistance Dressing Assistance: Limited assistance     Functional Limitations Info  Sight, Hearing, Speech Sight Info: Adequate Hearing Info: Adequate Speech Info: Impaired    SPECIAL CARE FACTORS FREQUENCY  PT (By licensed PT), OT (By licensed OT)     PT Frequency: 5 x weekly OT Frequency: 5 x weekly            Contractures Contractures Info: Not present    Additional Factors  Info  Code Status Code Status Info: full             Current Medications (06/25/2020):  This is the current hospital active medication list Current Facility-Administered Medications  Medication Dose Route Frequency Provider Last Rate Last Admin   acetaminophen (TYLENOL) tablet 650 mg  650 mg Oral Q6H PRN Zierle-Ghosh, Asia B, DO   650 mg at 06/24/20 2150   Or   acetaminophen (TYLENOL) suppository 650 mg  650 mg Rectal Q6H PRN Zierle-Ghosh, Asia B, DO       aspirin EC tablet 81 mg  81 mg Oral Daily Zierle-Ghosh, Asia B, DO   81 mg at 06/24/20 1236   bisacodyl (DULCOLAX) suppository 10 mg  10 mg Rectal Daily PRN Zierle-Ghosh, Asia B, DO       ceFAZolin (ANCEF) IVPB 2g/100 mL premix  2 g Intravenous Q8H Della Goo, RPH 200 mL/hr at 06/25/20 1100 2 g at 06/25/20 1100   feeding supplement (ENSURE ENLIVE / ENSURE PLUS) liquid 237 mL  237 mL Oral BID BM Zierle-Ghosh, Asia B, DO   237 mL at 06/24/20 1702   feeding supplement (PRO-STAT SUGAR FREE 64) liquid 30 mL  30 mL Oral BID Zierle-Ghosh, Asia B, DO   30 mL at 06/24/20 2150   finasteride (PROSCAR) tablet 5 mg  5 mg Oral Daily Zierle-Ghosh, Asia B, DO   5 mg at 06/25/20 1102   heparin injection 5,000 Units  5,000 Units  Subcutaneous Q8H Zierle-Ghosh, Asia B, DO   5,000 Units at 06/25/20 0540   iron polysaccharides (NIFEREX) capsule 150 mg  150 mg Oral Daily Zierle-Ghosh, Asia B, DO   150 mg at 06/25/20 1101   levETIRAcetam (KEPPRA) tablet 500 mg  500 mg Oral BID Zierle-Ghosh, Asia B, DO   500 mg at 06/25/20 1100   loratadine (CLARITIN) tablet 10 mg  10 mg Oral Daily Zierle-Ghosh, Asia B, DO   10 mg at 06/25/20 1102   metroNIDAZOLE (FLAGYL) IVPB 500 mg  500 mg Intravenous Q8H Jerald Kief, MD 100 mL/hr at 06/25/20 0540 500 mg at 06/25/20 0540   nutrition supplement (JUVEN) (JUVEN) powder packet 1 packet  1 packet Oral BID BM Zierle-Ghosh, Asia B, DO   1 packet at 06/24/20 1700   ondansetron (ZOFRAN) tablet 4 mg  4 mg Oral  Q6H PRN Zierle-Ghosh, Asia B, DO       Or   ondansetron (ZOFRAN) injection 4 mg  4 mg Intravenous Q6H PRN Zierle-Ghosh, Asia B, DO       potassium chloride SA (KLOR-CON) CR tablet 40 mEq  40 mEq Oral BID Jerald Kief, MD       pravastatin (PRAVACHOL) tablet 40 mg  40 mg Oral Daily Zierle-Ghosh, Asia B, DO   40 mg at 06/25/20 1101   sodium phosphate (FLEET) 7-19 GM/118ML enema 1 enema  1 enema Rectal Once PRN Zierle-Ghosh, Asia B, DO       vitamin B-12 (CYANOCOBALAMIN) tablet 500 mcg  500 mcg Oral Daily Zierle-Ghosh, Asia B, DO   500 mcg at 06/25/20 1101   zinc sulfate capsule 220 mg  220 mg Oral Daily Zierle-Ghosh, Asia B, DO   220 mg at 06/25/20 1110     Discharge Medications: Please see discharge summary for a list of discharge medications.  Relevant Imaging Results:  Relevant Lab Results:   Additional Information [id number]  Golda Acre, RN

## 2020-06-26 ENCOUNTER — Ambulatory Visit (HOSPITAL_BASED_OUTPATIENT_CLINIC_OR_DEPARTMENT_OTHER): Payer: Medicare Other

## 2020-06-26 DIAGNOSIS — E870 Hyperosmolality and hypernatremia: Secondary | ICD-10-CM | POA: Diagnosis not present

## 2020-06-26 DIAGNOSIS — M8600 Acute hematogenous osteomyelitis, unspecified site: Secondary | ICD-10-CM

## 2020-06-26 LAB — COMPREHENSIVE METABOLIC PANEL
ALT: 14 U/L (ref 0–44)
AST: 22 U/L (ref 15–41)
Albumin: 2.2 g/dL — ABNORMAL LOW (ref 3.5–5.0)
Alkaline Phosphatase: 47 U/L (ref 38–126)
Anion gap: 13 (ref 5–15)
BUN: 10 mg/dL (ref 6–20)
CO2: 26 mmol/L (ref 22–32)
Calcium: 7.5 mg/dL — ABNORMAL LOW (ref 8.9–10.3)
Chloride: 98 mmol/L (ref 98–111)
Creatinine, Ser: 0.53 mg/dL — ABNORMAL LOW (ref 0.61–1.24)
GFR, Estimated: >60 mL/min (ref >60)
Glucose, Bld: 106 mg/dL — ABNORMAL HIGH (ref 70–99)
Potassium: 3.3 mmol/L — ABNORMAL LOW (ref 3.5–5.1)
Sodium: 137 mmol/L (ref 135–145)
Total Bilirubin: 0.5 mg/dL (ref 0.3–1.2)
Total Protein: 5.2 g/dL — ABNORMAL LOW (ref 6.5–8.1)

## 2020-06-26 LAB — MAGNESIUM: Magnesium: 2 mg/dL (ref 1.7–2.4)

## 2020-06-26 MED ORDER — POTASSIUM CHLORIDE CRYS ER 20 MEQ PO TBCR
40.0000 meq | EXTENDED_RELEASE_TABLET | Freq: Two times a day (BID) | ORAL | Status: AC
  Administered 2020-06-26 (×2): 40 meq via ORAL
  Filled 2020-06-26 (×2): qty 2

## 2020-06-26 NOTE — Progress Notes (Signed)
Occupational Therapy Evaluation  Patient with above diagnosis seen for acute OT evaluation. 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  He has been bed bound for 2 months with declining status for 7 months. Patient was unable to follow directions requiring total A x2 for all mobility. Patient has been seen by therapy during past hospitalization and did not progress functionally. Discussed with patient's sister that patient would not be appropriate for skilled therapy at SNF but will require 24/7 care. Sister verbalized understanding. Will discontinue acute OT services at this time.    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  Pt admitted with sepsis secondary to PNE.  Precautions  Precautions Fall  Precaution Comments pressure ulcers  Restrictions  Weight Bearing Restrictions No  Home Living  Family/patient expects to be discharged to: Other (Comment) (long term care)  Living Arrangements Other (Comment) (caregiver)  Additional Comments Pt was staying at an alternative living facility  Prior Function  Level of Independence Needs assistance  Comments Pt's sister present. She reports pt has been declining in fuction since 04/21 and  bed bound for 2 months.  Reports wound care MD recommended bed rest and no sitting due to sacral wounds.  Communication  Communication Receptive difficulties;Expressive difficulties  Pain Assessment  Pain Assessment Faces  Faces Pain Scale 6  Pain Location generalized   Pain Descriptors / Indicators Moaning  Pain Intervention(s) Monitored during session  Cognition  Arousal/Alertness Awake/alert  Behavior During Therapy Anxious  Overall Cognitive Status History of cognitive impairments - at baseline  Upper Extremity Assessment  Upper Extremity Assessment Generalized weakness;Difficult to assess due to impaired cognition  Lower Extremity Assessment  Lower Extremity Assessment Defer to PT evaluation  ADL  Overall ADL's  At baseline (pt requiring significant assistance for all ADLs ~2 mons)  Bed Mobility  Overal bed mobility Needs Assistance  Bed Mobility Rolling;Supine to Sit;Sit to Supine  Rolling Total assist;+2 for physical assistance  Supine to sit Total assist;+2 for physical assistance  Sit to supine Total assist;+2 for physical assistance  Transfers  General transfer comment bedbound  Balance  Overall balance assessment Needs assistance  Sitting-balance support Bilateral upper extremity supported  Sitting balance-Leahy Scale Zero  Sitting balance - Comments requiring max A and after 2-3 mins pt reaching at therapist arm that is supporting him so that he could lay down  OT - End of Session  Activity Tolerance Patient limited by fatigue  Patient left in bed;with call bell/phone within reach;with family/visitor present  Nurse Communication Mobility status  OT Assessment  OT Recommendation/Assessment Patient does not need any further OT services  OT Visit Diagnosis Other abnormalities of gait and mobility (R26.89)  OT Problem List Decreased activity tolerance;Impaired balance (sitting and/or standing)  AM-PAC OT "6 Clicks" Daily Activity Outcome Measure (Version 2)  Help from another person eating meals? 1  Help from another person taking care of personal grooming? 1  Help from another person toileting, which includes using toliet, bedpan, or urinal? 1  Help from another person bathing (including washing, rinsing,  drying)? 1  Help from  another person to put on and taking off regular upper body clothing? 1  Help from another person to put on and taking off regular lower body clothing? 1  6 Click Score 6  OT Recommendation  Follow Up Recommendations Supervision/Assistance - 24 hour  OT Equipment None recommended by OT  Acute Rehab OT Goals  Patient Stated Goal unable  OT Goal Formulation With patient/family  OT Time Calculation  OT Start Time (ACUTE ONLY) 1210  OT Stop Time (ACUTE ONLY) 1232  OT Time Calculation (min) 22 min  OT General Charges  $OT Visit 1 Visit  OT Evaluation  $OT Eval Low Complexity 1 Low  Written Expression  Dominant Hand Right   Marlyce Huge OT OT pager: [phone]

## 2020-06-26 NOTE — Clinical Social Work Note (Addendum)
30 Day Passar Note  RE: Tracy Todd        Date of Birth: 10-Apr-1967__   Date: 06/26/2020       To Whom It May Concern:  Please be advised that the above-named patient will require a short-term nursing home stay - anticipated 30 days or less for rehabilitation and strengthening.  The plan is for return home.   Windell Moulding, MSW, [place] [phone]

## 2020-06-26 NOTE — TOC Progression Note (Addendum)
Transition of Care [facility]) - Progression Note    Patient Details  Name: Tracy Todd MRN: [id number] Date of Birth: July 03, 1966  Transition of Care [facility]) CM/SW Contact  Coralyn Helling, LCSW Phone Number: 06/26/2020, 11:39 AM  Clinical Narrative:  Original plan was home with home health.  Patient worked up for SNF. Patient is awaiting PASRR number. Additional clinicals faxed to PASRR. Patient needs bed offers pending PASRR #     Expected Discharge Plan: Home w Home Health Services Barriers to Discharge: No Barriers Identified  Expected Discharge Plan and Services Expected Discharge Plan: Home w Home Health Services       Living arrangements for the past 2 months: Single Family Home                                       Social Determinants of Health (SDOH) Interventions    Readmission Risk Interventions No flowsheet data found.

## 2020-06-26 NOTE — TOC Progression Note (Signed)
Transition of Care [facility]) - Progression Note    Patient Details  Name: Tracy Todd MRN: [id number] Date of Birth: [birth year]  Transition of Care [facility]) CM/SW Contact  Coralyn Helling, LCSW Phone Number: 06/26/2020, 3:23 PM  Clinical Narrative:   Patient originally worked up for SNF. Patient does not have a skilled need, but requiring a higher level of care. Patient currently from home with family.     Expected Discharge Plan: Home w Home Health Services Barriers to Discharge: Awaiting State Approval (PASRR), Other (comment) (Patient needs LTC medicaid bed.)  Expected Discharge Plan and Services Expected Discharge Plan: Home w Home Health Services       Living arrangements for the past 2 months: Single Family Home                                       Social Determinants of Health (SDOH) Interventions    Readmission Risk Interventions No flowsheet data found.

## 2020-06-26 NOTE — Progress Notes (Signed)
SLP Cancellation Note  Patient Details Name: Tracy Todd MRN: [id number] DOB: 29-Jul-1966   Cancelled treatment:       Reason Eval/Treat Not Completed: Other (comment) (pt was with PT and OT when SLP went by, will continue efforts)  [name], MS [facility] SLP Acute Rehab Services Office [phone] Pager [phone]   [name] 06/26/2020, 1230 PM

## 2020-06-26 NOTE — Progress Notes (Signed)
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  Patient was followed by infectious disease clinic and supposed to have a PICC line placement and 6 weeks of IV antibiotics.  Currently on oral antibiotics with cefdinir and Flagyl. In the emergency room, temperature 99.7.  Heart rate 102.  Blood pressures fairly normal.  89% on room air. Lactic acid 1.5.  CT scan of the chest abdomen pelvis showed right lower lobe consolidation with mucoid material on the right lower lobe bronchus.  Decubitus ulceration, erosive changes of the sacrum.  Patient was treated as sepsis and admitted to the hospital.  Assessment & Plan:   Active Problems:   Pressure injury of skin   Sepsis (HCC)   Sepsis secondary to right lower lobe pneumonia with hypoxia:  -Suspect aspiration with both gram-positive and gram-negative bacteremia. -Pt had been on cefdinir and Flagyl as outpatient.   -Blood cultures were drawn and initially treated with cefepime and vancomycin -Oxygen to keep saturations more than 90%. -Patient is unable to follow any commands or instructions to do chest physiotherapy.   -SLP consulted and pt is now s/p modified barium swallow. Rec for regular solids with thin liquids -as per below, patient is continued on ancef and flagyl  Chronic decubitus ulcer/sacral osteomyelitis: Present on admission.  Stage IV sacral decubitus. -Blood cx confirmed neg -PICC placed by IR for long-term abx, placed on 11/4 -Continued on ancef plus flagyl per ID recs  Hyperlipidemia:  -Continue statin as  tolerated  Seizure disorder:  -Remains stable on Keppra.  Down syndrome/bedbound status/contractures and incontinence:  -Lives at home with family. -Plan for SNF placement. TOC following  Hypernatremia:  -Due to free water deficit.  Treated with isotonic fluid, normalized Repeat bmet in AM  Hypokalemia: -Remains low. Will replace -Mg level appears within normal limits -Repeat levels in AM   DVT prophylaxis: Heparin subq Code Status: Full Family Communication: Pt in room, family is at bedside  Status is: Inpatient  Remains inpatient appropriate because:Unsafe d/c plan   Dispo: The patient is from: Home              Anticipated d/c is to: SNF              Anticipated d/c date is: 2 days              Patient currently is medically stable to d/c. Just pending disposition  Consultants:   ID  IR  Procedures:   PICC placed under anesthesia 11/4 by IR  Antimicrobials: Anti-infectives (From admission, onward)   Start     Dose/Rate Route Frequency Ordered Stop   06/25/20 2200  ceFAZolin (ANCEF) IVPB 2g/100 mL premix  Status:  Discontinued        2 g 200 mL/hr over 30 Minutes Intravenous Every 8 hours 06/25/20 1004 06/25/20 1005   06/25/20 1500  metroNIDAZOLE (FLAGYL) tablet 500 mg        500 mg Oral Every 8 hours 06/25/20 1405     06/25/20 1100  ceFAZolin (ANCEF) IVPB 2g/100 mL premix        2 g 200 mL/hr over 30 Minutes Intravenous Every 8  hours 06/25/20 1005     06/24/20 1800  vancomycin (VANCOREADY) IVPB 750 mg/150 mL  Status:  Discontinued        750 mg 150 mL/hr over 60 Minutes Intravenous Every 24 hours 06/24/20 0938 06/24/20 1205   06/24/20 1700  ceFAZolin (ANCEF) IVPB 2g/100 mL premix  Status:  Discontinued        2 g 200 mL/hr over 30 Minutes Intravenous Every 12 hours 06/24/20 1232 06/25/20 1004   06/24/20 1300  metroNIDAZOLE (FLAGYL) IVPB 500 mg  Status:  Discontinued        500 mg 100 mL/hr over 60 Minutes Intravenous Every 8 hours 06/24/20 1205  06/25/20 1405   06/23/20 1800  vancomycin (VANCOCIN) IVPB 1000 mg/200 mL premix  Status:  Discontinued        1,000 mg 200 mL/hr over 60 Minutes Intravenous Every 24 hours 06/23/20 0325 06/24/20 0938   06/23/20 1000  levofloxacin (LEVAQUIN) tablet 750 mg  Status:  Discontinued        750 mg Oral Daily 06/22/20 2228 06/22/20 2228   06/23/20 0600  ceFEPIme (MAXIPIME) 2 g in sodium chloride 0.9 % 100 mL IVPB  Status:  Discontinued        2 g 200 mL/hr over 30 Minutes Intravenous Every 12 hours 06/23/20 0325 06/24/20 1205   06/22/20 1715  aztreonam (AZACTAM) 2 g in sodium chloride 0.9 % 100 mL IVPB  Status:  Discontinued        2 g 200 mL/hr over 30 Minutes Intravenous  Once 06/22/20 1700 06/22/20 1712   06/22/20 1715  metroNIDAZOLE (FLAGYL) IVPB 500 mg        500 mg 100 mL/hr over 60 Minutes Intravenous  Once 06/22/20 1700 06/22/20 2001   06/22/20 1715  vancomycin (VANCOCIN) IVPB 1000 mg/200 mL premix        1,000 mg 200 mL/hr over 60 Minutes Intravenous  Once 06/22/20 1700 06/22/20 2029   06/22/20 1715  ceFEPIme (MAXIPIME) 2 g in sodium chloride 0.9 % 100 mL IVPB        2 g 200 mL/hr over 30 Minutes Intravenous  Once 06/22/20 1712 06/22/20 1853      Subjective: Unable to assess as pt is largely nonverbal  Objective: Vitals:   06/25/20 1435 06/25/20 2023 06/26/20 0455 06/26/20 1458  BP: (!) 127/57 (!) 95/55 (!) 108/51 99/69  Pulse: 73 85 69 85  Resp: 17 14 14 17   Temp:  98.5 F (36.9 C) (!) 97.4 F (36.3 C) 98.1 F (36.7 C)  TempSrc:  Oral Oral Oral  SpO2: 100% 100%  98%  Weight:      Height:        Intake/Output Summary (Last 24 hours) at 06/26/2020 1721 Last data filed at 06/26/2020 13/12/2019 Gross per 24 hour  Intake 120 ml  Output 1400 ml  Net -1280 ml   Filed Weights   06/22/20 1713  Weight: 40.8 kg    Examination: General exam: Not conversant, in no acute distress Respiratory system: normal chest rise, clear, no audible wheezing Cardiovascular system: regular  rhythm, s1-s2 Gastrointestinal system: Nondistended, nontender, pos BS Central nervous system: No seizures, no tremors Extremities: No cyanosis, no joint deformities Skin: No rashes, no pallor Psychiatry: Unable to assess given mentation  Data Reviewed: I have personally reviewed following labs and imaging studies  CBC: Recent Labs  Lab 06/22/20 1700 06/23/20 0511 06/25/20 0613  WBC 7.5 6.8 6.8  NEUTROABS 6.3  --   --   HGB  13.1 10.5* 10.9*  HCT 45.7 36.9* 36.6*  MCV 105.5* 107.0* 104.6*  PLT 438* 285 264   Basic Metabolic Panel: Recent Labs  Lab 06/22/20 1700 06/23/20 0511 06/25/20 0613 06/26/20 0553  NA 152* 145 141 137  K 3.5 3.7 3.2* 3.3*  CL 114* 110 105 98  CO2 26 28 28 26   GLUCOSE 130* 101* 96 106*  BUN 41* 21* 19 10  CREATININE 0.90 0.68 0.51* 0.53*  CALCIUM 8.4* 7.9* 7.7* 7.5*  MG  --   --   --  2.0   GFR: Estimated Creatinine Clearance: 60.2 mL/min (A) (by C-G formula based on SCr of 0.53 mg/dL (L)). Liver Function Tests: Recent Labs  Lab 06/22/20 1700 06/23/20 0511 06/25/20 0613 06/26/20 0553  AST 59* 32 19 22  ALT 45* 29 18 14   ALKPHOS 94 62 51 47  BILITOT 0.7 0.7 0.4 0.5  PROT 6.9 5.1* 5.0* 5.2*  ALBUMIN 2.9* 2.2* 2.2* 2.2*   No results for input(s): LIPASE, AMYLASE in the last 168 hours. No results for input(s): AMMONIA in the last 168 hours. Coagulation Profile: Recent Labs  Lab 06/22/20 1700 06/23/20 0511  INR 1.7* 1.7*   Cardiac Enzymes: No results for input(s): CKTOTAL, CKMB, CKMBINDEX, TROPONINI in the last 168 hours. BNP (last 3 results) No results for input(s): PROBNP in the last 8760 hours. HbA1C: No results for input(s): HGBA1C in the last 72 hours. CBG: No results for input(s): GLUCAP in the last 168 hours. Lipid Profile: No results for input(s): CHOL, HDL, LDLCALC, TRIG, CHOLHDL, LDLDIRECT in the last 72 hours. Thyroid Function Tests: No results for input(s): TSH, T4TOTAL, FREET4, T3FREE, THYROIDAB in the last 72  hours. Anemia Panel: No results for input(s): VITAMINB12, FOLATE, FERRITIN, TIBC, IRON, RETICCTPCT in the last 72 hours. Sepsis Labs: Recent Labs  Lab 06/22/20 1700 06/22/20 1855 06/23/20 0511  PROCALCITON  --   --  0.19  LATICACIDVEN 1.5 1.9 2.1*    Recent Results (from the past 240 hour(s))  Blood Culture (routine x 2)     Status: None (Preliminary result)   Collection Time: 06/22/20  5:55 PM   Specimen: BLOOD  Result Value Ref Range Status   Specimen Description   Final    BLOOD RIGHT ANTECUBITAL Performed at [facility], [address] [place], [place] [address]    Special Requests   Final    BOTTLES DRAWN AEROBIC AND ANAEROBIC Blood Culture results may not be optimal due to an inadequate volume of blood received in culture bottles Performed at [facility], [address] [place], [place] [address]    Culture   Final    NO GROWTH 4 DAYS Performed at [facility], [address] [place], [place] [address]    Report Status PENDING  Incomplete  Respiratory Panel by RT PCR (Flu A&B, Covid) - Nasopharyngeal Swab     Status: None   Collection Time: 06/22/20  5:55 PM   Specimen: Nasopharyngeal Swab  Result Value Ref Range Status   SARS Coronavirus 2 by RT PCR NEGATIVE NEGATIVE Final    Comment: (NOTE) SARS-CoV-2 target nucleic acids are NOT DETECTED.  The SARS-CoV-2 RNA is generally detectable in upper respiratoy specimens during the acute phase of infection. The lowest concentration of SARS-CoV-2 viral copies this assay can detect is 131 copies/mL. A negative result does not preclude SARS-Cov-2 infection and should not be used as the sole basis for treatment or other patient management decisions. A negative  result may occur with  improper specimen collection/handling, submission of specimen other than nasopharyngeal swab, presence of viral mutation(s) within the areas targeted by this assay, and inadequate number of viral  copies (<131 copies/mL). A negative result must be combined with clinical observations, patient history, and epidemiological information. The expected result is Negative.  Fact Sheet for Patients:  https://www.moore.com/  Fact Sheet for Healthcare Providers:  https://www.young.biz/  This test is no t yet approved or cleared by the [place] FDA and  has been authorized for detection and/or diagnosis of SARS-CoV-2 by FDA under an Emergency Use Authorization (EUA). This EUA will remain  in effect (meaning this test can be used) for the duration of the COVID-19 declaration under Section 564(b)(1) of the Act, 21 U.S.C. section 360bbb-3(b)(1), unless the authorization is terminated or revoked sooner.     Influenza A by PCR NEGATIVE NEGATIVE Final   Influenza B by PCR NEGATIVE NEGATIVE Final    Comment: (NOTE) The Xpert Xpress SARS-CoV-2/FLU/RSV assay is intended as an aid in  the diagnosis of influenza from Nasopharyngeal swab specimens and  should not be used as a sole basis for treatment. Nasal washings and  aspirates are unacceptable for Xpert Xpress SARS-CoV-2/FLU/RSV  testing.  Fact Sheet for Patients: https://www.moore.com/  Fact Sheet for Healthcare Providers: https://www.young.biz/  This test is not yet approved or cleared by the [place] FDA and  has been authorized for detection and/or diagnosis of SARS-CoV-2 by  FDA under an Emergency Use Authorization (EUA). This EUA will remain  in effect (meaning this test can be used) for the duration of the  Covid-19 declaration under Section 564(b)(1) of the Act, 21  U.S.C. section 360bbb-3(b)(1), unless the authorization is  terminated or revoked. Performed at [facility], [address], [place], [place] [address]   Blood Culture (routine x 2)     Status: None (Preliminary result)   Collection Time: 06/22/20  6:00 PM    Specimen: BLOOD  Result Value Ref Range Status   Specimen Description   Final    BLOOD LEFT ANTECUBITAL Performed at [facility], [address], [place], [place] [address]    Special Requests   Final    BOTTLES DRAWN AEROBIC AND ANAEROBIC Blood Culture adequate volume Performed at [facility], [address], [place], [place] [address]    Culture   Final    NO GROWTH 4 DAYS Performed at [facility], [address] [place], [place] [address]    Report Status PENDING  Incomplete  Urine culture     Status: Abnormal   Collection Time: 06/22/20 10:24 PM   Specimen: In/Out Cath Urine  Result Value Ref Range Status   Specimen Description   Final    IN/OUT CATH URINE Performed at [facility], [address] [place], [place] [address]    Special Requests   Final    NONE Performed at [facility], [address], [place], [place] [address]    Culture MULTIPLE SPECIES PRESENT, SUGGEST RECOLLECTION (A)  Final   Report Status 06/23/2020 FINAL  Final  MRSA PCR Screening     Status: None   Collection Time: 06/23/20 10:46 AM   Specimen: Nasal Mucosa; Nasopharyngeal  Result Value Ref Range Status   MRSA by PCR NEGATIVE NEGATIVE Final    Comment:        The GeneXpert MRSA Assay (FDA approved for NASAL specimens only), is one component of  a comprehensive MRSA colonization surveillance program. It is not intended to diagnose MRSA infection nor to guide or monitor treatment for MRSA infections. Performed at [facility], [address] [place], [place] [address]      Radiology Studies: [facility] PLACEMENT LEFT >5 YRS INC IMG GUIDE  Result Date: 06/25/2020 INDICATION: A 54 year old male with down syndrome and acute hematogenous osteomyelitis. He requires PICC line placement for outpatient IV antibiotic therapy. EXAM: PICC LINE PLACEMENT WITH ULTRASOUND AND FLUOROSCOPIC GUIDANCE  MEDICATIONS: None. ANESTHESIA/SEDATION: 1 mg Versed given for anxiolysis. FLUOROSCOPY TIME:  Fluoroscopy Time: 0 minutes 18 seconds (1 mGy). COMPLICATIONS: None immediate. PROCEDURE: The patient was advised of the possible risks and complications and agreed to undergo the procedure. The patient was then brought to the angiographic suite for the procedure. The right/left arm was prepped with chlorhexidine, draped in the usual sterile fashion using maximum barrier technique (cap and mask, sterile gown, sterile gloves, large sterile sheet, hand hygiene and cutaneous antisepsis) and infiltrated locally with 1% Lidocaine. Ultrasound demonstrated patency of the right basilic vein, and this was documented with an image. Under real-time ultrasound guidance, this vein was accessed with a 21 gauge micropuncture needle and image documentation was performed. A 0.018 wire was introduced in to the vein. Over this, a 5 [place] single lumen power-injectable PICC was advanced to the lower SVC/right atrial junction. Fluoroscopy during the procedure and fluoro spot radiograph confirms appropriate catheter position. The catheter was flushed and covered with a sterile dressing. Catheter length: 39 cm IMPRESSION: Successful right arm Power PICC line placement with ultrasound and fluoroscopic guidance. The catheter is ready for use. Electronically Signed   By: Malachy Moan M.D.   On: 06/25/2020 16:07    Scheduled Meds: . aspirin EC  81 mg Oral Daily  . feeding supplement  237 mL Oral BID BM  . feeding supplement (PRO-STAT SUGAR FREE 64)  30 mL Oral BID  . finasteride  5 mg Oral Daily  . heparin  5,000 Units Subcutaneous Q8H  . iron polysaccharides  150 mg Oral Daily  . levETIRAcetam  500 mg Oral BID  . loratadine  10 mg Oral Daily  . metroNIDAZOLE  500 mg Oral Q8H  . nutrition supplement (JUVEN)  1 packet Oral BID BM  . potassium chloride  40 mEq Oral BID  . pravastatin  40 mg Oral Daily  . vitamin B-12  500 mcg Oral  Daily  . zinc sulfate  220 mg Oral Daily   Continuous Infusions: .  ceFAZolin (ANCEF) IV 2 g (06/26/20 1456)     LOS: 4 days   Rickey Barbara, MD [facility] Pager On Amion  If 7PM-7AM, please contact night-coverage 06/26/2020, 5:21 PM

## 2020-06-26 NOTE — Evaluation (Signed)
Physical Therapy Evaluation Patient Details Name: Tracy Todd MRN: [id number] DOB: December 07, 1965 Today's Date: 06/26/2020   History of Present Illness  Pt is 54 yo male with hx of seizure, syncope, HLD, Down Syndrome, and bedbound status with contractures and chronic sacral decubitus.  Pt admitted with sepsis secondary to PNE.  Clinical Impression  Pt admitted with above diagnosis.  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  He has been bed bound for 2 months with declining status for 7 months.  Pt is unable to follow commands or participate with therapy.  Pt has been seen by therapy at prior admission without improvement or ability to participate.  Pt with no rehab potential.  Pt does not have acute therapy needs.  He does require 24 hr assist and would benefit from long term placement if current facility is unable to provide his care.     Follow Up Recommendations Other (comment) (long term care/ nursing home)    Equipment Recommendations       Recommendations for Other Services       Precautions / Restrictions Precautions Precautions: Fall Precaution Comments: pressure ulcers      Mobility  Bed Mobility Overal bed mobility: Needs Assistance Bed Mobility: Rolling;Supine to Sit;Sit to Supine Rolling: Total assist;+2 for physical assistance   Supine to sit: Total assist;+2 for physical assistance Sit to supine: Total assist;+2 for physical assistance        Transfers                 General transfer comment: bedbound  Ambulation/Gait                Stairs            Wheelchair Mobility    Modified Rankin (Stroke Patients Only)       Balance Overall balance assessment: Needs assistance Sitting-balance support: Bilateral upper extremity supported Sitting balance-Leahy Scale: Zero Sitting balance - Comments: requiring max A and after 2-3 mins  pt reaching at therapist arm that is supporting him so that he could lay down     Standing balance-Leahy Scale: Zero Standing balance comment: unable                             Pertinent Vitals/Pain Pain Assessment: Faces Faces Pain Scale: Hurts even more Pain Location: generalized Pain Descriptors / Indicators:  (pt grimacing, restless, and grabbing therapist arm that was holding him so that he could lay down) Pain Intervention(s): Monitored during session;Repositioned    Home Living Family/patient expects to be discharged to:: Other (Comment) (long term care/NHP) Living Arrangements: Other (Comment) (caregiver)               Additional Comments: Pt was staying at an alternative living facility    Prior Function           Comments: Pt's sister present. She reports pt has been declining in fuction since 04/21 and  bed bound for 2 months.  Reports wound care MD recommended bed rest and no sitting due to sacral wounds.     Hand Dominance        Extremity/Trunk Assessment   Upper Extremity Assessment Upper Extremity Assessment: Defer to OT evaluation    Lower Extremity Assessment Lower Extremity Assessment: LLE deficits/detail;RLE deficits/detail RLE Deficits / Details: No AROM and keeps legs in R  sidelying position with knees flexed; knees with flexion contractures and ankles with PF contractures LLE Deficits / Details: No AROM and keeps legs in R sidelying position with knees flexed; knees with flexion contractures and ankles with PF contractures    Cervical / Trunk Assessment Cervical / Trunk Assessment: Kyphotic  Communication   Communication: Receptive difficulties;Expressive difficulties  Cognition Arousal/Alertness: Awake/alert Behavior During Therapy: Anxious Overall Cognitive Status: History of cognitive impairments - at baseline                                        General Comments General comments (skin integrity,  edema, etc.): Discussed PT/OT evaluation with sister and that due to contractures, pt not able to participate, wounds, and overall declining status that pt really has no rehab potential. She was tearful but states "I knew, it's just hard to hear." Discussed would need more long term care/NH instead of SNF for rehab.    Exercises     Assessment/Plan    PT Assessment Patent does not need any further PT services  PT Problem List         PT Treatment Interventions      PT Goals (Current goals can be found in the Care Plan section)  Acute Rehab PT Goals Patient Stated Goal: unable PT Goal Formulation: All assessment and education complete, DC therapy Potential to Achieve Goals: Poor    Frequency     Barriers to discharge        Co-evaluation PT/OT/SLP Co-Evaluation/Treatment: Yes Reason for Co-Treatment: Complexity of the patient's impairments (multi-system involvement);For patient/therapist safety PT goals addressed during session: Mobility/safety with mobility OT goals addressed during session: ADL's and self-care       AM-PAC PT "6 Clicks" Mobility  Outcome Measure Help needed turning from your back to your side while in a flat bed without using bedrails?: Total Help needed moving from lying on your back to sitting on the side of a flat bed without using bedrails?: Total Help needed moving to and from a bed to a chair (including a wheelchair)?: Total Help needed standing up from a chair using your arms (e.g., wheelchair or bedside chair)?: Total Help needed to walk in hospital room?: Total Help needed climbing 3-5 steps with a railing? : Total 6 Click Score: 6    End of Session   Activity Tolerance: Patient limited by fatigue;Patient limited by pain Patient left: in bed;with call bell/phone within reach;with bed alarm set;with family/visitor present Nurse Communication: Mobility status;Need for lift equipment      Time: 1210-1233 PT Time Calculation (min) (ACUTE  ONLY): 23 min   Charges:   PT Evaluation $PT Eval Low Complexity: 1 Low          Porcia Morganti, PT Acute Rehab Services Pager [phone] [facility] [phone]    Rayetta Humphrey 06/26/2020, 1:11 PM

## 2020-06-27 DIAGNOSIS — E1169 Type 2 diabetes mellitus with other specified complication: Secondary | ICD-10-CM | POA: Diagnosis not present

## 2020-06-27 DIAGNOSIS — M869 Osteomyelitis, unspecified: Secondary | ICD-10-CM | POA: Diagnosis not present

## 2020-06-27 LAB — COMPREHENSIVE METABOLIC PANEL
ALT: 10 U/L (ref 0–44)
AST: 20 U/L (ref 15–41)
Albumin: 2.3 g/dL — ABNORMAL LOW (ref 3.5–5.0)
Alkaline Phosphatase: 50 U/L (ref 38–126)
Anion gap: 9 (ref 5–15)
BUN: 12 mg/dL (ref 6–20)
CO2: 28 mmol/L (ref 22–32)
Calcium: 8.1 mg/dL — ABNORMAL LOW (ref 8.9–10.3)
Chloride: 101 mmol/L (ref 98–111)
Creatinine, Ser: 0.68 mg/dL (ref 0.61–1.24)
GFR, Estimated: >60 mL/min (ref >60)
Glucose, Bld: 101 mg/dL — ABNORMAL HIGH (ref 70–99)
Potassium: 4.3 mmol/L (ref 3.5–5.1)
Sodium: 138 mmol/L (ref 135–145)
Total Bilirubin: 0.4 mg/dL (ref 0.3–1.2)
Total Protein: 5.5 g/dL — ABNORMAL LOW (ref 6.5–8.1)

## 2020-06-27 LAB — CULTURE, BLOOD (ROUTINE X 2)
Culture: NO GROWTH
Culture: NO GROWTH
Special Requests: ADEQUATE

## 2020-06-27 LAB — MAGNESIUM: Magnesium: 2 mg/dL (ref 1.7–2.4)

## 2020-06-27 MED ORDER — CHLORHEXIDINE GLUCONATE CLOTH 2 % EX PADS
6.0000 | MEDICATED_PAD | Freq: Every day | CUTANEOUS | Status: DC
  Administered 2020-06-27 – 2020-07-20 (×22): 6 via TOPICAL

## 2020-06-27 NOTE — Progress Notes (Signed)
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  Patient was followed by infectious disease clinic and supposed to have a PICC line placement and 6 weeks of IV antibiotics.  Currently on oral antibiotics with cefdinir and Flagyl. In the emergency room, temperature 99.7.  Heart rate 102.  Blood pressures fairly normal.  89% on room air. Lactic acid 1.5.  CT scan of the chest abdomen pelvis showed right lower lobe consolidation with mucoid material on the right lower lobe bronchus.  Decubitus ulceration, erosive changes of the sacrum.  Patient was treated as sepsis and admitted to the hospital.  Assessment & Plan:   Active Problems:   Pressure injury of skin   Sepsis (HCC)   Sepsis secondary to right lower lobe pneumonia with hypoxia:  -Suspect aspiration with both gram-positive and gram-negative bacteremia. -Pt had been on cefdinir and Flagyl as outpatient.   -Blood cultures were drawn and initially treated with cefepime and vancomycin -Oxygen to keep saturations more than 90%. -Patient is unable to follow any commands or instructions to do chest physiotherapy.   -SLP consulted and pt is now s/p modified barium swallow. Rec for regular solids with thin liquids -as per below, patient to continue on ancef and flagyl  Chronic decubitus ulcer/sacral osteomyelitis: Present on admission.  Stage IV sacral decubitus. -Blood cx confirmed neg -PICC placed by IR for long-term abx, placed on 11/4 -Continued on ancef plus flagyl per above as per ID recs  Hyperlipidemia:  -Continue statin as  tolerated  Seizure disorder:  -Remains stable on Keppra.  Down syndrome/bedbound status/contractures and incontinence:  -Lives at home with family. -Plan for SNF placement. TOC following  Hypernatremia:  -Due to free water deficit.  Treated with isotonic fluid, normalized Repeat bmet in AM  Hypokalemia: -Nicely corrected, currently 4.3 -Mg level appears within normal limits -Repeat levels in AM -continue to encourage PO intake as tolerated   DVT prophylaxis: Heparin subq Code Status: Full Family Communication: Pt in room, family is at bedside  Status is: Inpatient  Remains inpatient appropriate because:Unsafe d/c plan   Dispo: The patient is from: Home              Anticipated d/c is to: SNF              Anticipated d/c date is: 2 days              Patient currently is medically stable to d/c. Just pending disposition  Consultants:   ID  IR  Procedures:   PICC placed under anesthesia 11/4 by IR  Antimicrobials: Anti-infectives (From admission, onward)   Start     Dose/Rate Route Frequency Ordered Stop   06/25/20 2200  ceFAZolin (ANCEF) IVPB 2g/100 mL premix  Status:  Discontinued        2 g 200 mL/hr over 30 Minutes Intravenous Every 8 hours 06/25/20 1004 06/25/20 1005   06/25/20 1500  metroNIDAZOLE (FLAGYL) tablet 500 mg        500 mg Oral Every 8 hours 06/25/20 1405     06/25/20 1100  ceFAZolin (ANCEF) IVPB 2g/100 mL premix  2 g 200 mL/hr over 30 Minutes Intravenous Every 8 hours 06/25/20 1005     06/24/20 1800  vancomycin (VANCOREADY) IVPB 750 mg/150 mL  Status:  Discontinued        750 mg 150 mL/hr over 60 Minutes Intravenous Every 24 hours 06/24/20 0938 06/24/20 1205   06/24/20 1700  ceFAZolin (ANCEF) IVPB 2g/100 mL premix  Status:  Discontinued        2 g 200 mL/hr over 30 Minutes Intravenous Every 12 hours 06/24/20 1232 06/25/20 1004   06/24/20 1300  metroNIDAZOLE (FLAGYL) IVPB 500 mg  Status:  Discontinued        500 mg 100 mL/hr over  60 Minutes Intravenous Every 8 hours 06/24/20 1205 06/25/20 1405   06/23/20 1800  vancomycin (VANCOCIN) IVPB 1000 mg/200 mL premix  Status:  Discontinued        1,000 mg 200 mL/hr over 60 Minutes Intravenous Every 24 hours 06/23/20 0325 06/24/20 0938   06/23/20 1000  levofloxacin (LEVAQUIN) tablet 750 mg  Status:  Discontinued        750 mg Oral Daily 06/22/20 2228 06/22/20 2228   06/23/20 0600  ceFEPIme (MAXIPIME) 2 g in sodium chloride 0.9 % 100 mL IVPB  Status:  Discontinued        2 g 200 mL/hr over 30 Minutes Intravenous Every 12 hours 06/23/20 0325 06/24/20 1205   06/22/20 1715  aztreonam (AZACTAM) 2 g in sodium chloride 0.9 % 100 mL IVPB  Status:  Discontinued        2 g 200 mL/hr over 30 Minutes Intravenous  Once 06/22/20 1700 06/22/20 1712   06/22/20 1715  metroNIDAZOLE (FLAGYL) IVPB 500 mg        500 mg 100 mL/hr over 60 Minutes Intravenous  Once 06/22/20 1700 06/22/20 2001   06/22/20 1715  vancomycin (VANCOCIN) IVPB 1000 mg/200 mL premix        1,000 mg 200 mL/hr over 60 Minutes Intravenous  Once 06/22/20 1700 06/22/20 2029   06/22/20 1715  ceFEPIme (MAXIPIME) 2 g in sodium chloride 0.9 % 100 mL IVPB        2 g 200 mL/hr over 30 Minutes Intravenous  Once 06/22/20 1712 06/22/20 1853      Subjective: Awake and tracking, but largely nonverbal  Objective: Vitals:   06/26/20 1458 06/26/20 1952 06/27/20 0610 06/27/20 1419  BP: 99/69 (!) 106/50 (!) 106/54 (!) 178/151  Pulse: 85 82 88 99  Resp: 17 14 14 15   Temp: 98.1 F (36.7 C) 99.1 F (37.3 C) 99.8 F (37.7 C) 97.6 F (36.4 C)  TempSrc: Oral Oral Oral Oral  SpO2: 98%   96%  Weight:      Height:        Intake/Output Summary (Last 24 hours) at 06/27/2020 1534 Last data filed at 06/27/2020 1450 Gross per 24 hour  Intake 849.77 ml  Output 1200 ml  Net -350.23 ml   Filed Weights   06/22/20 1713  Weight: 40.8 kg    Examination: General exam: Awake, laying in bed, in nad Respiratory system: Normal respiratory  effort, no wheezing Cardiovascular system: regular rate, s1, s2 Gastrointestinal system: Soft, nondistended, positive BS Central nervous system: CN2-12 grossly intact, strength intact Extremities: Perfused, no clubbing Skin: Normal skin turgor, no notable skin lesions seen Psychiatry: Unable to assess given mentation  Data Reviewed: I have personally reviewed following labs and imaging studies  CBC: Recent Labs  Lab 06/22/20 1700 06/23/20 0511 06/25/20 0613  WBC 7.5 6.8  6.8  NEUTROABS 6.3  --   --   HGB 13.1 10.5* 10.9*  HCT 45.7 36.9* 36.6*  MCV 105.5* 107.0* 104.6*  PLT 438* 285 264   Basic Metabolic Panel: Recent Labs  Lab 06/22/20 1700 06/23/20 0511 06/25/20 0613 06/26/20 0553 06/27/20 0400  NA 152* 145 141 137 138  K 3.5 3.7 3.2* 3.3* 4.3  CL 114* 110 105 98 101  CO2 26 28 28 26 28   GLUCOSE 130* 101* 96 106* 101*  BUN 41* 21* 19 10 12   CREATININE 0.90 0.68 0.51* 0.53* 0.68  CALCIUM 8.4* 7.9* 7.7* 7.5* 8.1*  MG  --   --   --  2.0 2.0   GFR: Estimated Creatinine Clearance: 60.2 mL/min (by C-G formula based on SCr of 0.68 mg/dL). Liver Function Tests: Recent Labs  Lab 06/22/20 1700 06/23/20 0511 06/25/20 0613 06/26/20 0553 06/27/20 0400  AST 59* 32 19 22 20   ALT 45* 29 18 14 10   ALKPHOS 94 62 51 47 50  BILITOT 0.7 0.7 0.4 0.5 0.4  PROT 6.9 5.1* 5.0* 5.2* 5.5*  ALBUMIN 2.9* 2.2* 2.2* 2.2* 2.3*   No results for input(s): LIPASE, AMYLASE in the last 168 hours. No results for input(s): AMMONIA in the last 168 hours. Coagulation Profile: Recent Labs  Lab 06/22/20 1700 06/23/20 0511  INR 1.7* 1.7*   Cardiac Enzymes: No results for input(s): CKTOTAL, CKMB, CKMBINDEX, TROPONINI in the last 168 hours. BNP (last 3 results) No results for input(s): PROBNP in the last 8760 hours. HbA1C: No results for input(s): HGBA1C in the last 72 hours. CBG: No results for input(s): GLUCAP in the last 168 hours. Lipid Profile: No results for input(s): CHOL, HDL,  LDLCALC, TRIG, CHOLHDL, LDLDIRECT in the last 72 hours. Thyroid Function Tests: No results for input(s): TSH, T4TOTAL, FREET4, T3FREE, THYROIDAB in the last 72 hours. Anemia Panel: No results for input(s): VITAMINB12, FOLATE, FERRITIN, TIBC, IRON, RETICCTPCT in the last 72 hours. Sepsis Labs: Recent Labs  Lab 06/22/20 1700 06/22/20 1855 06/23/20 0511  PROCALCITON  --   --  0.19  LATICACIDVEN 1.5 1.9 2.1*    Recent Results (from the past 240 hour(s))  Blood Culture (routine x 2)     Status: None   Collection Time: 06/22/20  5:55 PM   Specimen: BLOOD  Result Value Ref Range Status   Specimen Description   Final    BLOOD RIGHT ANTECUBITAL Performed at [facility], [address], [place], [place]    Special Requests   Final    BOTTLES DRAWN AEROBIC AND ANAEROBIC Blood Culture results may not be optimal due to an inadequate volume of blood received in culture bottles Performed at [facility], [address], [place], [place]    Culture   Final    NO GROWTH 5 DAYS Performed at [facility], [address] [place], [address] [facility]    Report Status 06/27/2020 FINAL  Final  Respiratory Panel by RT PCR (Flu A&B, Covid) - Nasopharyngeal Swab     Status: None   Collection Time: 06/22/20  5:55 PM   Specimen: Nasopharyngeal Swab  Result Value Ref Range Status   SARS Coronavirus 2 by RT PCR NEGATIVE NEGATIVE Final    Comment: (NOTE) SARS-CoV-2 target nucleic acids are NOT DETECTED.  The SARS-CoV-2 RNA is generally detectable in upper respiratoy specimens during the acute phase of infection. The lowest concentration of SARS-CoV-2 viral copies this assay can detect is  131 copies/mL. A negative result does not preclude SARS-Cov-2 infection and should not be used as the sole basis for treatment or other patient management decisions. A negative result may occur with  improper specimen collection/handling, submission of  specimen other than nasopharyngeal swab, presence of viral mutation(s) within the areas targeted by this assay, and inadequate number of viral copies (<131 copies/mL). A negative result must be combined with clinical observations, patient history, and epidemiological information. The expected result is Negative.  Fact Sheet for Patients:  https://www.moore.com/  Fact Sheet for Healthcare Providers:  https://www.young.biz/  This test is no t yet approved or cleared by the [place] FDA and  has been authorized for detection and/or diagnosis of SARS-CoV-2 by FDA under an Emergency Use Authorization (EUA). This EUA will remain  in effect (meaning this test can be used) for the duration of the COVID-19 declaration under Section 564(b)(1) of the Act, 21 U.S.C. section 360bbb-3(b)(1), unless the authorization is terminated or revoked sooner.     Influenza A by PCR NEGATIVE NEGATIVE Final   Influenza B by PCR NEGATIVE NEGATIVE Final    Comment: (NOTE) The Xpert Xpress SARS-CoV-2/FLU/RSV assay is intended as an aid in  the diagnosis of influenza from Nasopharyngeal swab specimens and  should not be used as a sole basis for treatment. Nasal washings and  aspirates are unacceptable for Xpert Xpress SARS-CoV-2/FLU/RSV  testing.  Fact Sheet for Patients: https://www.moore.com/  Fact Sheet for Healthcare Providers: https://www.young.biz/  This test is not yet approved or cleared by the [place] FDA and  has been authorized for detection and/or diagnosis of SARS-CoV-2 by  FDA under an Emergency Use Authorization (EUA). This EUA will remain  in effect (meaning this test can be used) for the duration of the  Covid-19 declaration under Section 564(b)(1) of the Act, 21  U.S.C. section 360bbb-3(b)(1), unless the authorization is  terminated or revoked. Performed at [facility], [address], [place], [place] [address]   Blood Culture (routine x 2)     Status: None   Collection Time: 06/22/20  6:00 PM   Specimen: BLOOD  Result Value Ref Range Status   Specimen Description   Final    BLOOD LEFT ANTECUBITAL Performed at [facility], [address], [place], [place] [address]    Special Requests   Final    BOTTLES DRAWN AEROBIC AND ANAEROBIC Blood Culture adequate volume Performed at [facility], [address] [place], [place] [address]    Culture   Final    NO GROWTH 5 DAYS Performed at [facility], [address], [place], [place] [address]    Report Status 06/27/2020 FINAL  Final  Urine culture     Status: Abnormal   Collection Time: 06/22/20 10:24 PM   Specimen: In/Out Cath Urine  Result Value Ref Range Status   Specimen Description   Final    IN/OUT CATH URINE Performed at [facility], [address], [place], [place] [address]    Special Requests   Final    NONE Performed at [facility], [address] [place], [place] [address]    Culture MULTIPLE SPECIES PRESENT, SUGGEST RECOLLECTION (A)  Final   Report Status 06/23/2020 FINAL  Final  MRSA PCR Screening     Status: None   Collection Time: 06/23/20 10:46 AM   Specimen: Nasal Mucosa; Nasopharyngeal  Result Value Ref Range Status   MRSA by PCR NEGATIVE  NEGATIVE Final    Comment:        The GeneXpert MRSA Assay (FDA approved for NASAL specimens only), is one component of a comprehensive MRSA colonization surveillance program. It is not intended to diagnose MRSA infection nor to guide or monitor treatment for MRSA infections. Performed at [facility], [address] [place], [place] [address]      Radiology Studies: No results found.  Scheduled Meds: . aspirin EC  81 mg Oral Daily  . Chlorhexidine Gluconate Cloth  6 each Topical Daily  . feeding supplement  237 mL Oral BID BM    . feeding supplement (PRO-STAT SUGAR FREE 64)  30 mL Oral BID  . finasteride  5 mg Oral Daily  . heparin  5,000 Units Subcutaneous Q8H  . iron polysaccharides  150 mg Oral Daily  . levETIRAcetam  500 mg Oral BID  . loratadine  10 mg Oral Daily  . metroNIDAZOLE  500 mg Oral Q8H  . nutrition supplement (JUVEN)  1 packet Oral BID BM  . pravastatin  40 mg Oral Daily  . vitamin B-12  500 mcg Oral Daily  . zinc sulfate  220 mg Oral Daily   Continuous Infusions: .  ceFAZolin (ANCEF) IV 2 g (06/27/20 1346)     LOS: 5 days   Rickey Barbara, MD [facility] Pager On Amion  If 7PM-7AM, please contact night-coverage 06/27/2020, 3:34 PM

## 2020-06-28 DIAGNOSIS — A419 Sepsis, unspecified organism: Secondary | ICD-10-CM | POA: Diagnosis not present

## 2020-06-28 DIAGNOSIS — J189 Pneumonia, unspecified organism: Secondary | ICD-10-CM | POA: Diagnosis not present

## 2020-06-28 LAB — COMPREHENSIVE METABOLIC PANEL
ALT: 7 U/L (ref 0–44)
AST: 17 U/L (ref 15–41)
Albumin: 2.4 g/dL — ABNORMAL LOW (ref 3.5–5.0)
Alkaline Phosphatase: 53 U/L (ref 38–126)
Anion gap: 11 (ref 5–15)
BUN: 11 mg/dL (ref 6–20)
CO2: 27 mmol/L (ref 22–32)
Calcium: 7.8 mg/dL — ABNORMAL LOW (ref 8.9–10.3)
Chloride: 100 mmol/L (ref 98–111)
Creatinine, Ser: 0.48 mg/dL — ABNORMAL LOW (ref 0.61–1.24)
GFR, Estimated: >60 mL/min (ref >60)
Glucose, Bld: 109 mg/dL — ABNORMAL HIGH (ref 70–99)
Potassium: 3.9 mmol/L (ref 3.5–5.1)
Sodium: 138 mmol/L (ref 135–145)
Total Bilirubin: 0.4 mg/dL (ref 0.3–1.2)
Total Protein: 5.7 g/dL — ABNORMAL LOW (ref 6.5–8.1)

## 2020-06-28 MED ORDER — HEPARIN SOD (PORK) LOCK FLUSH 100 UNIT/ML IV SOLN
250.0000 [IU] | INTRAVENOUS | Status: DC | PRN
  Filled 2020-06-28: qty 2.5

## 2020-06-28 MED ORDER — SELENIUM SULFIDE 2.5 % EX LOTN
TOPICAL_LOTION | Freq: Every day | CUTANEOUS | Status: DC
  Filled 2020-06-28: qty 118

## 2020-06-28 NOTE — Progress Notes (Signed)
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  Patient was followed by infectious disease clinic and supposed to have a PICC line placement and 6 weeks of IV antibiotics.  Currently on oral antibiotics with cefdinir and Flagyl. In the emergency room, temperature 99.7.  Heart rate 102.  Blood pressures fairly normal.  89% on room air. Lactic acid 1.5.  CT scan of the chest abdomen pelvis showed right lower lobe consolidation with mucoid material on the right lower lobe bronchus.  Decubitus ulceration, erosive changes of the sacrum.  Patient was treated as sepsis and admitted to the hospital.  Assessment & Plan:   Active Problems:   Pressure injury of skin   Sepsis (HCC)   Sepsis secondary to right lower lobe pneumonia with hypoxia:  -Suspect aspiration with both gram-positive and gram-negative bacteremia. -Pt had been on cefdinir and Flagyl as outpatient.   -Blood cultures were drawn and initially treated with cefepime and vancomycin -Oxygen to keep saturations more than 90%. -Patient is unable to follow any commands or instructions to do chest physiotherapy.   -SLP consulted and pt is now s/p modified barium swallow. Rec for regular solids with thin liquids -as per below, will continue patient on ancef and flagyl  Chronic decubitus ulcer/sacral osteomyelitis: Present on admission.  Stage IV sacral decubitus. -Blood cx confirmed neg -PICC placed by IR for long-term abx, placed on 11/4 -Continued on ancef plus flagyl per above as per ID recs  Hyperlipidemia:  -Continue statin as  tolerated  Seizure disorder:  -Remains stable on Keppra.  Down syndrome/bedbound status/contractures and incontinence:  -Lives at home with family. -Plan for SNF placement. TOC following  Hypernatremia:  -Due to free water deficit.  Treated with isotonic fluid, normalized Recheck bmet in AM  Hypokalemia: -Remains within normal limits at 3.9 -Mg level appears within normal limits -Repeat levels in AM -continue to encourage PO intake as tolerated   DVT prophylaxis: Heparin subq Code Status: Full Family Communication: Pt in room, family is at bedside  Status is: Inpatient  Remains inpatient appropriate because:Unsafe d/c plan   Dispo: The patient is from: Home              Anticipated d/c is to: SNF              Anticipated d/c date is: 2 days              Patient currently is medically stable to d/c. Just pending disposition  Consultants:   ID  IR  Procedures:   PICC placed under anesthesia 11/4 by IR  Antimicrobials: Anti-infectives (From admission, onward)   Start     Dose/Rate Route Frequency Ordered Stop   06/25/20 2200  ceFAZolin (ANCEF) IVPB 2g/100 mL premix  Status:  Discontinued        2 g 200 mL/hr over 30 Minutes Intravenous Every 8 hours 06/25/20 1004 06/25/20 1005   06/25/20 1500  metroNIDAZOLE (FLAGYL) tablet 500 mg        500 mg Oral Every 8 hours 06/25/20 1405     06/25/20 1100  ceFAZolin (ANCEF) IVPB 2g/100 mL premix  2 g 200 mL/hr over 30 Minutes Intravenous Every 8 hours 06/25/20 1005     06/24/20 1800  vancomycin (VANCOREADY) IVPB 750 mg/150 mL  Status:  Discontinued        750 mg 150 mL/hr over 60 Minutes Intravenous Every 24 hours 06/24/20 0938 06/24/20 1205   06/24/20 1700  ceFAZolin (ANCEF) IVPB 2g/100 mL premix  Status:  Discontinued        2 g 200 mL/hr over 30 Minutes Intravenous Every 12 hours 06/24/20 1232 06/25/20 1004   06/24/20 1300  metroNIDAZOLE (FLAGYL) IVPB 500 mg  Status:  Discontinued        500 mg 100 mL/hr  over 60 Minutes Intravenous Every 8 hours 06/24/20 1205 06/25/20 1405   06/23/20 1800  vancomycin (VANCOCIN) IVPB 1000 mg/200 mL premix  Status:  Discontinued        1,000 mg 200 mL/hr over 60 Minutes Intravenous Every 24 hours 06/23/20 0325 06/24/20 0938   06/23/20 1000  levofloxacin (LEVAQUIN) tablet 750 mg  Status:  Discontinued        750 mg Oral Daily 06/22/20 2228 06/22/20 2228   06/23/20 0600  ceFEPIme (MAXIPIME) 2 g in sodium chloride 0.9 % 100 mL IVPB  Status:  Discontinued        2 g 200 mL/hr over 30 Minutes Intravenous Every 12 hours 06/23/20 0325 06/24/20 1205   06/22/20 1715  aztreonam (AZACTAM) 2 g in sodium chloride 0.9 % 100 mL IVPB  Status:  Discontinued        2 g 200 mL/hr over 30 Minutes Intravenous  Once 06/22/20 1700 06/22/20 1712   06/22/20 1715  metroNIDAZOLE (FLAGYL) IVPB 500 mg        500 mg 100 mL/hr over 60 Minutes Intravenous  Once 06/22/20 1700 06/22/20 2001   06/22/20 1715  vancomycin (VANCOCIN) IVPB 1000 mg/200 mL premix        1,000 mg 200 mL/hr over 60 Minutes Intravenous  Once 06/22/20 1700 06/22/20 2029   06/22/20 1715  ceFEPIme (MAXIPIME) 2 g in sodium chloride 0.9 % 100 mL IVPB        2 g 200 mL/hr over 30 Minutes Intravenous  Once 06/22/20 1712 06/22/20 1853      Subjective: Remains awake, not very conversant  Objective: Vitals:   06/27/20 1803 06/27/20 2019 06/28/20 0448 06/28/20 1400  BP: (!) 127/110 97/61 114/68 139/78  Pulse:  91 100 89  Resp:  20 20 17   Temp:  (!) 97.5 F (36.4 C) 97.8 F (36.6 C) 98.2 F (36.8 C)  TempSrc:  Axillary Oral Axillary  SpO2:  99% 97%   Weight:      Height:        Intake/Output Summary (Last 24 hours) at 06/28/2020 1626 Last data filed at 06/28/2020 1340 Gross per 24 hour  Intake 458 ml  Output 1700 ml  Net -1242 ml   Filed Weights   06/22/20 1713  Weight: 40.8 kg    Examination: General exam: Not very conversant, in no acute distress Respiratory system: normal chest rise, clear, no  audible wheezing Cardiovascular system: regular rhythm, s1-s2 Gastrointestinal system: Nondistended, nontender, pos BS Central nervous system: No seizures, no tremors Extremities: No cyanosis, no joint deformities Skin: No rashes, no pallor Psychiatry: Affect normal // no auditory hallucinations   Data Reviewed: I have personally reviewed following labs and imaging studies  CBC: Recent Labs  Lab 06/22/20 1700 06/23/20 0511 06/25/20 0613  WBC 7.5 6.8 6.8  NEUTROABS 6.3  --   --  HGB 13.1 10.5* 10.9*  HCT 45.7 36.9* 36.6*  MCV 105.5* 107.0* 104.6*  PLT 438* 285 264   Basic Metabolic Panel: Recent Labs  Lab 06/23/20 0511 06/25/20 0613 06/26/20 0553 06/27/20 0400 06/28/20 0500  NA 145 141 137 138 138  K 3.7 3.2* 3.3* 4.3 3.9  CL 110 105 98 101 100  CO2 28 28 26 28 27   GLUCOSE 101* 96 106* 101* 109*  BUN 21* 19 10 12 11   CREATININE 0.68 0.51* 0.53* 0.68 0.48*  CALCIUM 7.9* 7.7* 7.5* 8.1* 7.8*  MG  --   --  2.0 2.0  --    GFR: Estimated Creatinine Clearance: 60.2 mL/min (A) (by C-G formula based on SCr of 0.48 mg/dL (L)). Liver Function Tests: Recent Labs  Lab 06/23/20 0511 06/25/20 0613 06/26/20 0553 06/27/20 0400 06/28/20 0500  AST 32 19 22 20 17   ALT 29 18 14 10 7   ALKPHOS 62 51 47 50 53  BILITOT 0.7 0.4 0.5 0.4 0.4  PROT 5.1* 5.0* 5.2* 5.5* 5.7*  ALBUMIN 2.2* 2.2* 2.2* 2.3* 2.4*   No results for input(s): LIPASE, AMYLASE in the last 168 hours. No results for input(s): AMMONIA in the last 168 hours. Coagulation Profile: Recent Labs  Lab 06/22/20 1700 06/23/20 0511  INR 1.7* 1.7*   Cardiac Enzymes: No results for input(s): CKTOTAL, CKMB, CKMBINDEX, TROPONINI in the last 168 hours. BNP (last 3 results) No results for input(s): PROBNP in the last 8760 hours. HbA1C: No results for input(s): HGBA1C in the last 72 hours. CBG: No results for input(s): GLUCAP in the last 168 hours. Lipid Profile: No results for input(s): CHOL, HDL, LDLCALC, TRIG,  CHOLHDL, LDLDIRECT in the last 72 hours. Thyroid Function Tests: No results for input(s): TSH, T4TOTAL, FREET4, T3FREE, THYROIDAB in the last 72 hours. Anemia Panel: No results for input(s): VITAMINB12, FOLATE, FERRITIN, TIBC, IRON, RETICCTPCT in the last 72 hours. Sepsis Labs: Recent Labs  Lab 06/22/20 1700 06/22/20 1855 06/23/20 0511  PROCALCITON  --   --  0.19  LATICACIDVEN 1.5 1.9 2.1*    Recent Results (from the past 240 hour(s))  Blood Culture (routine x 2)     Status: None   Collection Time: 06/22/20  5:55 PM   Specimen: BLOOD  Result Value Ref Range Status   Specimen Description   Final    BLOOD RIGHT ANTECUBITAL Performed at [facility], [address] [place], [place] [address]    Special Requests   Final    BOTTLES DRAWN AEROBIC AND ANAEROBIC Blood Culture results may not be optimal due to an inadequate volume of blood received in culture bottles Performed at [facility], [address] [place], [place] [address]    Culture   Final    NO GROWTH 5 DAYS Performed at [facility], [address] [place], [place] [address]    Report Status 06/27/2020 FINAL  Final  Respiratory Panel by RT PCR (Flu A&B, Covid) - Nasopharyngeal Swab     Status: None   Collection Time: 06/22/20  5:55 PM   Specimen: Nasopharyngeal Swab  Result Value Ref Range Status   SARS Coronavirus 2 by RT PCR NEGATIVE NEGATIVE Final    Comment: (NOTE) SARS-CoV-2 target nucleic acids are NOT DETECTED.  The SARS-CoV-2 RNA is generally detectable in upper respiratoy specimens during the acute phase of infection. The lowest concentration of SARS-CoV-2 viral copies this assay can detect is 131 copies/mL. A negative result does not preclude SARS-Cov-2  infection and should not be used as the sole basis for treatment or other patient management decisions. A negative result may occur with  improper specimen collection/handling, submission of specimen  other than nasopharyngeal swab, presence of viral mutation(s) within the areas targeted by this assay, and inadequate number of viral copies (<131 copies/mL). A negative result must be combined with clinical observations, patient history, and epidemiological information. The expected result is Negative.  Fact Sheet for Patients:  https://www.moore.com/  Fact Sheet for Healthcare Providers:  https://www.young.biz/  This test is no t yet approved or cleared by the [place] FDA and  has been authorized for detection and/or diagnosis of SARS-CoV-2 by FDA under an Emergency Use Authorization (EUA). This EUA will remain  in effect (meaning this test can be used) for the duration of the COVID-19 declaration under Section 564(b)(1) of the Act, 21 U.S.C. section 360bbb-3(b)(1), unless the authorization is terminated or revoked sooner.     Influenza A by PCR NEGATIVE NEGATIVE Final   Influenza B by PCR NEGATIVE NEGATIVE Final    Comment: (NOTE) The Xpert Xpress SARS-CoV-2/FLU/RSV assay is intended as an aid in  the diagnosis of influenza from Nasopharyngeal swab specimens and  should not be used as a sole basis for treatment. Nasal washings and  aspirates are unacceptable for Xpert Xpress SARS-CoV-2/FLU/RSV  testing.  Fact Sheet for Patients: https://www.moore.com/  Fact Sheet for Healthcare Providers: https://www.young.biz/  This test is not yet approved or cleared by the [place] FDA and  has been authorized for detection and/or diagnosis of SARS-CoV-2 by  FDA under an Emergency Use Authorization (EUA). This EUA will remain  in effect (meaning this test can be used) for the duration of the  Covid-19 declaration under Section 564(b)(1) of the Act, 21  U.S.C. section 360bbb-3(b)(1), unless the authorization is  terminated or revoked. Performed at [facility], [address], [place], [place] [address]   Blood Culture (routine x 2)     Status: None   Collection Time: 06/22/20  6:00 PM   Specimen: BLOOD  Result Value Ref Range Status   Specimen Description   Final    BLOOD LEFT ANTECUBITAL Performed at [facility], [address], [place], [place] [address]    Special Requests   Final    BOTTLES DRAWN AEROBIC AND ANAEROBIC Blood Culture adequate volume Performed at [facility], [address], [place], [place] [address]    Culture   Final    NO GROWTH 5 DAYS Performed at [facility], [address] [place], [place] [address]    Report Status 06/27/2020 FINAL  Final  Urine culture     Status: Abnormal   Collection Time: 06/22/20 10:24 PM   Specimen: In/Out Cath Urine  Result Value Ref Range Status   Specimen Description   Final    IN/OUT CATH URINE Performed at [facility], [address] [place], [place] [address]    Special Requests   Final    NONE Performed at [facility], [address] [place], [place] [address]    Culture MULTIPLE SPECIES PRESENT, SUGGEST RECOLLECTION (A)  Final   Report Status 06/23/2020 FINAL  Final  MRSA PCR Screening     Status: None   Collection Time: 06/23/20 10:46 AM   Specimen: Nasal Mucosa; Nasopharyngeal  Result Value Ref Range Status   MRSA by PCR NEGATIVE NEGATIVE Final    Comment:  The GeneXpert MRSA Assay (FDA approved for NASAL specimens only), is one component of a comprehensive MRSA colonization surveillance program. It is not intended to diagnose MRSA infection nor to guide or monitor treatment for MRSA infections. Performed at [facility], [address] [place], [place] [address]      Radiology Studies: No results found.  Scheduled Meds: . aspirin EC  81 mg Oral Daily  . Chlorhexidine Gluconate Cloth  6 each Topical Daily  . feeding supplement  237 mL Oral BID BM  . feeding  supplement (PRO-STAT SUGAR FREE 64)  30 mL Oral BID  . finasteride  5 mg Oral Daily  . heparin  5,000 Units Subcutaneous Q8H  . iron polysaccharides  150 mg Oral Daily  . levETIRAcetam  500 mg Oral BID  . loratadine  10 mg Oral Daily  . metroNIDAZOLE  500 mg Oral Q8H  . nutrition supplement (JUVEN)  1 packet Oral BID BM  . pravastatin  40 mg Oral Daily  . selenium sulfide   Topical Daily  . vitamin B-12  500 mcg Oral Daily  . zinc sulfate  220 mg Oral Daily   Continuous Infusions: .  ceFAZolin (ANCEF) IV 2 g (06/28/20 1431)     LOS: 6 days   Rickey Barbara, MD [facility] Pager On Amion  If 7PM-7AM, please contact night-coverage 06/28/2020, 4:26 PM

## 2020-06-28 NOTE — [place] FL2 (Signed)
[place] MEDICAID FL2 LEVEL OF CARE SCREENING TOOL     IDENTIFICATION  Patient Name: Tracy Todd Birthdate: 1966-02-02 Sex: male Admission Date (Current Location): 06/22/2020  [facility] and [place] Number:  Producer, television/film/video and Address:  [facility],  [address] [place], [place] [address]      Provider Number: [id number]  Attending Physician Name and Address:  Jerald Kief, MD  Relative Name and Phone Number:  Holly Pring [phone]    Current Level of Care: Hospital Recommended Level of Care:  (Patient needing SNF placement for long term IV abx) Prior Approval Number:    Date Approved/Denied:   PASRR Number: pending  Discharge Plan: SNF    Current Diagnoses: Patient Active Problem List   Diagnosis Date Noted  . Sepsis (HCC) 06/22/2020  . Seizure disorder (HCC) 05/21/2020  . Pressure injury of skin 02/06/2020  . Wound infection 02/05/2020  . Iron deficiency anemia 09/17/2019  . Elevated troponin 09/17/2019  . Syncope 09/16/2019  . Down syndrome 12/27/2012  . Arthritis 10/25/2011  . Hyperlipidemia 10/25/2011    Orientation RESPIRATION BLADDER Height & Weight     Situation, Place, Self  Normal Continent Weight: 90 lb (40.8 kg) Height:  5' (152.4 cm)  BEHAVIORAL SYMPTOMS/MOOD NEUROLOGICAL BOWEL NUTRITION STATUS      Continent Diet (regular)  AMBULATORY STATUS COMMUNICATION OF NEEDS Skin   Extensive Assist Verbally Normal                       Personal Care Assistance Level of Assistance  Bathing, Feeding, Dressing Bathing Assistance: Limited assistance Feeding assistance: Limited assistance Dressing Assistance: Limited assistance     Functional Limitations Info  Sight, Hearing, Speech Sight Info: Adequate Hearing Info: Adequate Speech Info: Impaired    SPECIAL CARE FACTORS FREQUENCY   (IV THERAPY)     PT Frequency: None OT Frequency: None            Contractures Contractures Info: Not present    Additional Factors  Info  Code Status Code Status Info: full             Current Medications (06/28/2020):  This is the current hospital active medication list Current Facility-Administered Medications  Medication Dose Route Frequency Provider Last Rate Last Admin  . acetaminophen (TYLENOL) tablet 650 mg  650 mg Oral Q6H PRN Zierle-Ghosh, Asia B, DO   650 mg at 06/27/20 1804   Or  . acetaminophen (TYLENOL) suppository 650 mg  650 mg Rectal Q6H PRN Zierle-Ghosh, Asia B, DO      . aspirin EC tablet 81 mg  81 mg Oral Daily Zierle-Ghosh, Asia B, DO   81 mg at 06/28/20 1142  . bisacodyl (DULCOLAX) suppository 10 mg  10 mg Rectal Daily PRN Zierle-Ghosh, Asia B, DO      . ceFAZolin (ANCEF) IVPB 2g/100 mL premix  2 g Intravenous Q8H Della Goo, RPH 200 mL/hr at 06/28/20 0547 2 g at 06/28/20 0547  . Chlorhexidine Gluconate Cloth 2 % PADS 6 each  6 each Topical Daily Jerald Kief, MD   6 each at 06/28/20 1154  . feeding supplement (ENSURE ENLIVE / ENSURE PLUS) liquid 237 mL  237 mL Oral BID BM Zierle-Ghosh, Asia B, DO   237 mL at 06/28/20 1153  . feeding supplement (PRO-STAT SUGAR FREE 64) liquid 30 mL  30 mL Oral BID Zierle-Ghosh, Asia B, DO   30 mL at 06/28/20 1139  . finasteride (PROSCAR) tablet 5  mg  5 mg Oral Daily Zierle-Ghosh, Asia B, DO   5 mg at 06/28/20 1143  . heparin injection 5,000 Units  5,000 Units Subcutaneous Q8H Zierle-Ghosh, Asia B, DO   5,000 Units at 06/28/20 0547  . heparin lock flush 100 unit/mL  250 Units Intracatheter Prior to discharge Jerald Kief, MD      . iron polysaccharides (NIFEREX) capsule 150 mg  150 mg Oral Daily Zierle-Ghosh, Asia B, DO   150 mg at 06/28/20 1142  . levETIRAcetam (KEPPRA) tablet 500 mg  500 mg Oral BID Zierle-Ghosh, Asia B, DO   500 mg at 06/28/20 1140  . loratadine (CLARITIN) tablet 10 mg  10 mg Oral Daily Zierle-Ghosh, Asia B, DO   10 mg at 06/28/20 1139  . metroNIDAZOLE (FLAGYL) tablet 500 mg  500 mg Oral Q8H Jerald Kief, MD   500 mg at 06/28/20  0547  . nutrition supplement (JUVEN) (JUVEN) powder packet 1 packet  1 packet Oral BID BM Zierle-Ghosh, Asia B, DO   1 packet at 06/28/20 1139  . ondansetron (ZOFRAN) tablet 4 mg  4 mg Oral Q6H PRN Zierle-Ghosh, Asia B, DO       Or  . ondansetron (ZOFRAN) injection 4 mg  4 mg Intravenous Q6H PRN Zierle-Ghosh, Asia B, DO      . pravastatin (PRAVACHOL) tablet 40 mg  40 mg Oral Daily Zierle-Ghosh, Asia B, DO   40 mg at 06/28/20 1141  . selenium sulfide (SELSUN) 2.5 % shampoo   Topical Daily Jerald Kief, MD      . sodium phosphate (FLEET) 7-19 GM/118ML enema 1 enema  1 enema Rectal Once PRN Zierle-Ghosh, Asia B, DO      . vitamin B-12 (CYANOCOBALAMIN) tablet 500 mcg  500 mcg Oral Daily Zierle-Ghosh, Asia B, DO   500 mcg at 06/28/20 1139  . zinc sulfate capsule 220 mg  220 mg Oral Daily Zierle-Ghosh, Asia B, DO   220 mg at 06/28/20 1141   Patient needing SNF placement for long term IV abx  Discharge Medications: Please see discharge summary for a list of discharge medications.  Additional Information ssn:[id number]  Gustavus Bryant, LCSW

## 2020-06-28 NOTE — TOC Progression Note (Addendum)
Transition of Care [facility]) - Progression Note    Patient Details  Name: Tracy Todd MRN: [id number] Date of Birth: Dec 01, 1965  Transition of Care [facility]) CM/SW [place], [place] Phone Number: 06/28/2020, 1:14 PM  Clinical Narrative:   LCSW met with patient and his sister on 06/28/20 and educated family on the LTC placement process. PT did not recommend the need for skilled nursing which prevents patient from getting rehab through Medicare.  Family does not wish to pay out of pocket for this service. However, patient is on long term IV antibiotics which could allow him to gave SNF coverage. Another option is LTC.  Patient has Medicaid and could use this benefit if he were to be placed under long term care. Family decline wanting to pursue LTC and questioned why patient does not meet a skilled nursing requirement for PT. LCSW informed family that based on PT's evaluation that due to contractures (pt would not able to participate) , wounds, and overall declining status that pt really has no rehab potential. Family wish to gain SNF placement for long term IV antibiotics. LCSW will update FL2 and sent out to facilities per family's consent. Family was receptive to education and emotional support provided by LCSW today.  Expected Discharge Plan: Cut Off Barriers to Discharge: [place] (PASRR), Other (comment) (Patient needs LTC medicaid bed.)  Expected Discharge Plan and Services Expected Discharge Plan: [place] or possible SNF placement      Living arrangements for the past 2 months: Single Family Home                   Readmission Risk Interventions No flowsheet data found.  Eula Fried, BSW, MSW, [email or web address] Phone: [phone]

## 2020-06-29 DIAGNOSIS — M869 Osteomyelitis, unspecified: Secondary | ICD-10-CM | POA: Diagnosis not present

## 2020-06-29 DIAGNOSIS — E1169 Type 2 diabetes mellitus with other specified complication: Secondary | ICD-10-CM | POA: Diagnosis not present

## 2020-06-29 LAB — COMPREHENSIVE METABOLIC PANEL
ALT: 6 U/L (ref 0–44)
AST: 17 U/L (ref 15–41)
Albumin: 2.1 g/dL — ABNORMAL LOW (ref 3.5–5.0)
Alkaline Phosphatase: 53 U/L (ref 38–126)
Anion gap: 6 (ref 5–15)
BUN: 16 mg/dL (ref 6–20)
CO2: 30 mmol/L (ref 22–32)
Calcium: 7.5 mg/dL — ABNORMAL LOW (ref 8.9–10.3)
Chloride: 100 mmol/L (ref 98–111)
Creatinine, Ser: 0.52 mg/dL — ABNORMAL LOW (ref 0.61–1.24)
GFR, Estimated: >60 mL/min (ref >60)
Glucose, Bld: 116 mg/dL — ABNORMAL HIGH (ref 70–99)
Potassium: 3.9 mmol/L (ref 3.5–5.1)
Sodium: 136 mmol/L (ref 135–145)
Total Bilirubin: 0.2 mg/dL — ABNORMAL LOW (ref 0.3–1.2)
Total Protein: 5.4 g/dL — ABNORMAL LOW (ref 6.5–8.1)

## 2020-06-29 LAB — MAGNESIUM: Magnesium: 2 mg/dL (ref 1.7–2.4)

## 2020-06-29 MED ORDER — CARVEDILOL 3.125 MG PO TABS: 3.1250 mg | ORAL_TABLET | Freq: Two times a day (BID) | ORAL | Status: DC

## 2020-06-29 MED ORDER — CARVEDILOL 6.25 MG PO TABS
6.2500 mg | ORAL_TABLET | Freq: Two times a day (BID) | ORAL | Status: DC
  Administered 2020-06-29 – 2020-06-30 (×4): 6.25 mg via ORAL
  Filled 2020-06-29 (×5): qty 1

## 2020-06-29 NOTE — Progress Notes (Signed)
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  Patient was followed by infectious disease clinic and supposed to have a PICC line placement and 6 weeks of IV antibiotics.  Currently on oral antibiotics with cefdinir and Flagyl. In the emergency room, temperature 99.7.  Heart rate 102.  Blood pressures fairly normal.  89% on room air. Lactic acid 1.5.  CT scan of the chest abdomen pelvis showed right lower lobe consolidation with mucoid material on the right lower lobe bronchus.  Decubitus ulceration, erosive changes of the sacrum.  Patient was treated as sepsis and admitted to the hospital.  Assessment & Plan:   Active Problems:   Pressure injury of skin   Sepsis (HCC)   Sepsis secondary to right lower lobe pneumonia with hypoxia:  -Suspect aspiration with both gram-positive and gram-negative bacteremia. -Pt had been on cefdinir and Flagyl as outpatient.   -Blood cultures were drawn and initially treated with cefepime and vancomycin -Oxygen to keep saturations more than 90%. -Patient is unable to follow any commands or instructions to do chest physiotherapy.   -SLP consulted and pt is now s/p modified barium swallow. Rec for regular solids with thin liquids -as per below, will continue patient on ancef and flagyl as tolerated  Chronic decubitus ulcer/sacral osteomyelitis: Present on admission.  Stage IV sacral decubitus. -Blood cx confirmed neg -PICC placed by IR for long-term abx, placed on 11/4 -Continued on ancef plus flagyl per above as per ID recs  Hyperlipidemia:    -Continue statin as pt tolerates  Seizure disorder:  -Remains stable on Keppra.  Down syndrome/bedbound status/contractures and incontinence:  -Lives at home with family. -Plan for SNF placement. TOC following  Hypernatremia:  -Due to free water deficit.  Treated with isotonic fluid, normalized Repeat bmet in AM  Hypokalemia: -Remains within normal limits at 3.9 -Mg level appears within normal limits -Repeat levels in AM -continue to encourage PO intake as tolerated  Vtach -Remains hemodynamically stable -transiently noted on tele -Started coreg, titrate as needed   DVT prophylaxis: Heparin subq Code Status: Full Family Communication: Pt in room, family is at bedside  Status is: Inpatient  Remains inpatient appropriate because:Unsafe d/c plan   Dispo: The patient is from: Home              Anticipated d/c is to: SNF              Anticipated d/c date is: 2 days              Patient currently is medically stable to d/c. Just pending disposition  Consultants:   ID  IR  Procedures:   PICC placed under anesthesia 11/4 by IR  Antimicrobials: Anti-infectives (From admission, onward)   Start     Dose/Rate Route Frequency Ordered Stop   06/25/20 2200  ceFAZolin (ANCEF) IVPB 2g/100 mL premix  Status:  Discontinued        2 g 200 mL/hr over 30 Minutes Intravenous Every 8 hours 06/25/20 1004 06/25/20 1005   06/25/20 1500  metroNIDAZOLE (FLAGYL) tablet 500 mg        500 mg Oral Every 8 hours 06/25/20 1405  06/25/20 1100  ceFAZolin (ANCEF) IVPB 2g/100 mL premix        2 g 200 mL/hr over 30 Minutes Intravenous Every 8 hours 06/25/20 1005     06/24/20 1800  vancomycin (VANCOREADY) IVPB 750 mg/150 mL  Status:  Discontinued        750 mg 150 mL/hr over 60 Minutes Intravenous Every 24 hours 06/24/20 0938 06/24/20 1205   06/24/20 1700  ceFAZolin (ANCEF) IVPB 2g/100 mL premix  Status:  Discontinued        2 g 200 mL/hr over 30 Minutes Intravenous Every 12 hours  06/24/20 1232 06/25/20 1004   06/24/20 1300  metroNIDAZOLE (FLAGYL) IVPB 500 mg  Status:  Discontinued        500 mg 100 mL/hr over 60 Minutes Intravenous Every 8 hours 06/24/20 1205 06/25/20 1405   06/23/20 1800  vancomycin (VANCOCIN) IVPB 1000 mg/200 mL premix  Status:  Discontinued        1,000 mg 200 mL/hr over 60 Minutes Intravenous Every 24 hours 06/23/20 0325 06/24/20 0938   06/23/20 1000  levofloxacin (LEVAQUIN) tablet 750 mg  Status:  Discontinued        750 mg Oral Daily 06/22/20 2228 06/22/20 2228   06/23/20 0600  ceFEPIme (MAXIPIME) 2 g in sodium chloride 0.9 % 100 mL IVPB  Status:  Discontinued        2 g 200 mL/hr over 30 Minutes Intravenous Every 12 hours 06/23/20 0325 06/24/20 1205   06/22/20 1715  aztreonam (AZACTAM) 2 g in sodium chloride 0.9 % 100 mL IVPB  Status:  Discontinued        2 g 200 mL/hr over 30 Minutes Intravenous  Once 06/22/20 1700 06/22/20 1712   06/22/20 1715  metroNIDAZOLE (FLAGYL) IVPB 500 mg        500 mg 100 mL/hr over 60 Minutes Intravenous  Once 06/22/20 1700 06/22/20 2001   06/22/20 1715  vancomycin (VANCOCIN) IVPB 1000 mg/200 mL premix        1,000 mg 200 mL/hr over 60 Minutes Intravenous  Once 06/22/20 1700 06/22/20 2029   06/22/20 1715  ceFEPIme (MAXIPIME) 2 g in sodium chloride 0.9 % 100 mL IVPB        2 g 200 mL/hr over 30 Minutes Intravenous  Once 06/22/20 1712 06/22/20 1853      Subjective: Awake, tracking with eyes, not conversant  Objective: Vitals:   06/29/20 0940 06/29/20 1223 06/29/20 1300 06/29/20 1712  BP: 112/68 (!) 82/50 (!) 87/49 (!) 93/55  Pulse: 74 70 67 80  Resp: 17 18    Temp:  97.6 F (36.4 C)    TempSrc:  Axillary    SpO2: 100% 100% 100%   Weight:      Height:        Intake/Output Summary (Last 24 hours) at 06/29/2020 1716 Last data filed at 06/29/2020 1700 Gross per 24 hour  Intake 1200 ml  Output 1700 ml  Net -500 ml   Filed Weights   06/22/20 1713  Weight: 40.8 kg    Examination: General exam:  Not conversant, in no acute distress Respiratory system: normal chest rise, clear, no audible wheezing Cardiovascular system: regular rhythm, s1-s2 Gastrointestinal system: Nondistended, nontender, pos BS Central nervous system: No seizures, no tremors Extremities: No cyanosis, no joint deformities Skin: No rashes, no pallor Psychiatry: Unable to assess given current mentation  Data Reviewed: I have personally reviewed following labs and imaging studies  CBC: Recent Labs  Lab 06/23/20 0511 06/25/20 6283  WBC 6.8 6.8  HGB 10.5* 10.9*  HCT 36.9* 36.6*  MCV 107.0* 104.6*  PLT 285 264   Basic Metabolic Panel: Recent Labs  Lab 06/25/20 0613 06/26/20 0553 06/27/20 0400 06/28/20 0500 06/29/20 0519 06/29/20 0810  NA 141 137 138 138 136  --   K 3.2* 3.3* 4.3 3.9 3.9  --   CL 105 98 101 100 100  --   CO2 28 26 28 27 30   --   GLUCOSE 96 106* 101* 109* 116*  --   BUN 19 10 12 11 16   --   CREATININE 0.51* 0.53* 0.68 0.48* 0.52*  --   CALCIUM 7.7* 7.5* 8.1* 7.8* 7.5*  --   MG  --  2.0 2.0  --   --  2.0   GFR: Estimated Creatinine Clearance: 60.2 mL/min (A) (by C-G formula based on SCr of 0.52 mg/dL (L)). Liver Function Tests: Recent Labs  Lab 06/25/20 0613 06/26/20 0553 06/27/20 0400 06/28/20 0500 06/29/20 0519  AST 19 22 20 17 17   ALT 18 14 10 7 6   ALKPHOS 51 47 50 53 53  BILITOT 0.4 0.5 0.4 0.4 0.2*  PROT 5.0* 5.2* 5.5* 5.7* 5.4*  ALBUMIN 2.2* 2.2* 2.3* 2.4* 2.1*   No results for input(s): LIPASE, AMYLASE in the last 168 hours. No results for input(s): AMMONIA in the last 168 hours. Coagulation Profile: Recent Labs  Lab 06/23/20 0511  INR 1.7*   Cardiac Enzymes: No results for input(s): CKTOTAL, CKMB, CKMBINDEX, TROPONINI in the last 168 hours. BNP (last 3 results) No results for input(s): PROBNP in the last 8760 hours. HbA1C: No results for input(s): HGBA1C in the last 72 hours. CBG: No results for input(s): GLUCAP in the last 168 hours. Lipid  Profile: No results for input(s): CHOL, HDL, LDLCALC, TRIG, CHOLHDL, LDLDIRECT in the last 72 hours. Thyroid Function Tests: No results for input(s): TSH, T4TOTAL, FREET4, T3FREE, THYROIDAB in the last 72 hours. Anemia Panel: No results for input(s): VITAMINB12, FOLATE, FERRITIN, TIBC, IRON, RETICCTPCT in the last 72 hours. Sepsis Labs: Recent Labs  Lab 06/22/20 1855 06/23/20 0511  PROCALCITON  --  0.19  LATICACIDVEN 1.9 2.1*    Recent Results (from the past 240 hour(s))  Blood Culture (routine x 2)     Status: None   Collection Time: 06/22/20  5:55 PM   Specimen: BLOOD  Result Value Ref Range Status   Specimen Description   Final    BLOOD RIGHT ANTECUBITAL Performed at [facility], [address] [place], [place]    Special Requests   Final    BOTTLES DRAWN AEROBIC AND ANAEROBIC Blood Culture results may not be optimal due to an inadequate volume of blood received in culture bottles Performed at [facility], [address], [place], [place]    Culture   Final    NO GROWTH 5 DAYS Performed at [facility], [address], [place], [address] [facility]    Report Status 06/27/2020 FINAL  Final  Respiratory Panel by RT PCR (Flu A&B, Covid) - Nasopharyngeal Swab     Status: None   Collection Time: 06/22/20  5:55 PM   Specimen: Nasopharyngeal Swab  Result Value Ref Range Status   SARS Coronavirus 2 by RT PCR NEGATIVE NEGATIVE Final    Comment: (NOTE) SARS-CoV-2 target nucleic acids are NOT DETECTED.  The SARS-CoV-2 RNA is generally detectable in upper respiratoy specimens during the acute phase of infection. The lowest concentration  of SARS-CoV-2 viral copies this assay can detect is 131 copies/mL. A negative result does not preclude SARS-Cov-2 infection and should not be used as the sole basis for treatment or other patient management decisions. A negative result may occur with  improper specimen  collection/handling, submission of specimen other than nasopharyngeal swab, presence of viral mutation(s) within the areas targeted by this assay, and inadequate number of viral copies (<131 copies/mL). A negative result must be combined with clinical observations, patient history, and epidemiological information. The expected result is Negative.  Fact Sheet for Patients:  https://www.moore.com/  Fact Sheet for Healthcare Providers:  https://www.young.biz/  This test is no t yet approved or cleared by the [place] FDA and  has been authorized for detection and/or diagnosis of SARS-CoV-2 by FDA under an Emergency Use Authorization (EUA). This EUA will remain  in effect (meaning this test can be used) for the duration of the COVID-19 declaration under Section 564(b)(1) of the Act, 21 U.S.C. section 360bbb-3(b)(1), unless the authorization is terminated or revoked sooner.     Influenza A by PCR NEGATIVE NEGATIVE Final   Influenza B by PCR NEGATIVE NEGATIVE Final    Comment: (NOTE) The Xpert Xpress SARS-CoV-2/FLU/RSV assay is intended as an aid in  the diagnosis of influenza from Nasopharyngeal swab specimens and  should not be used as a sole basis for treatment. Nasal washings and  aspirates are unacceptable for Xpert Xpress SARS-CoV-2/FLU/RSV  testing.  Fact Sheet for Patients: https://www.moore.com/  Fact Sheet for Healthcare Providers: https://www.young.biz/  This test is not yet approved or cleared by the [place] FDA and  has been authorized for detection and/or diagnosis of SARS-CoV-2 by  FDA under an Emergency Use Authorization (EUA). This EUA will remain  in effect (meaning this test can be used) for the duration of the  Covid-19 declaration under Section 564(b)(1) of the Act, 21  U.S.C. section 360bbb-3(b)(1), unless the authorization is  terminated or revoked. Performed at [facility], [address], [place], [place] [address]   Blood Culture (routine x 2)     Status: None   Collection Time: 06/22/20  6:00 PM   Specimen: BLOOD  Result Value Ref Range Status   Specimen Description   Final    BLOOD LEFT ANTECUBITAL Performed at [facility], [address], [place], [place] [address]    Special Requests   Final    BOTTLES DRAWN AEROBIC AND ANAEROBIC Blood Culture adequate volume Performed at [facility], [address] [place], [place] [address]    Culture   Final    NO GROWTH 5 DAYS Performed at [facility], [address], [place], [place] [address]    Report Status 06/27/2020 FINAL  Final  Urine culture     Status: Abnormal   Collection Time: 06/22/20 10:24 PM   Specimen: In/Out Cath Urine  Result Value Ref Range Status   Specimen Description   Final    IN/OUT CATH URINE Performed at [facility], [address], [place], [place] [address]    Special Requests   Final    NONE Performed at [facility], [address], [place], [place] [address]    Culture MULTIPLE SPECIES PRESENT, SUGGEST RECOLLECTION (A)  Final   Report Status 06/23/2020 FINAL  Final  MRSA PCR Screening     Status: None   Collection Time: 06/23/20 10:46 AM   Specimen: Nasal Mucosa; Nasopharyngeal  Result Value  Ref Range Status   MRSA by PCR NEGATIVE NEGATIVE Final    Comment:        The GeneXpert MRSA Assay (FDA approved for NASAL specimens only), is one component of a comprehensive MRSA colonization surveillance program. It is not intended to diagnose MRSA infection nor to guide or monitor treatment for MRSA infections. Performed at [facility], [address] [place], [place] [id number]      Radiology Studies: No results found.  Scheduled Meds: . aspirin EC  81 mg Oral Daily  . carvedilol  6.25 mg Oral BID WC  . Chlorhexidine Gluconate  Cloth  6 each Topical Daily  . feeding supplement  237 mL Oral BID BM  . feeding supplement (PRO-STAT SUGAR FREE 64)  30 mL Oral BID  . finasteride  5 mg Oral Daily  . heparin  5,000 Units Subcutaneous Q8H  . iron polysaccharides  150 mg Oral Daily  . levETIRAcetam  500 mg Oral BID  . loratadine  10 mg Oral Daily  . metroNIDAZOLE  500 mg Oral Q8H  . nutrition supplement (JUVEN)  1 packet Oral BID BM  . pravastatin  40 mg Oral Daily  . selenium sulfide   Topical Daily  . vitamin B-12  500 mcg Oral Daily  . zinc sulfate  220 mg Oral Daily   Continuous Infusions: .  ceFAZolin (ANCEF) IV 2 g (06/29/20 1354)     LOS: 7 days   Rickey BarbaraStephen Melvena Vink, MD [facility] Pager On Amion  If 7PM-7AM, please contact night-coverage 06/29/2020, 5:16 PM

## 2020-06-29 NOTE — Progress Notes (Signed)
Tele center called Rene Kocher B) to report patient had 6 beats VTach in 2.82 sec. Dr Rhona Leavens notified, and orders were written.

## 2020-06-29 NOTE — Progress Notes (Addendum)
Call received from telemetry Technician, Nilda Riggs, at about 0530 this AM reporting that pt had 3 Beats run of V-tach at about 8 PM earlier in shift. Telemetry tech reported late notification due to the fact that he was very busy with events happening on another floor as "there was a lot going on" per technician. Pt, at the time of notification, was resting quietly in bed with eyes closed and in normal sinus rhythm. Will notify Provider per protocol and continue to monitor Pt.

## 2020-06-29 NOTE — Care Management Important Message (Signed)
Important Message  Patient Details IM Letter given to the Patient Name: Tracy Todd MRN: [id number] Date of Birth: March 01, 1966   Medicare Important Message Given:  Yes     Caren Macadam 06/29/2020, 10:27 AM

## 2020-06-29 NOTE — TOC Progression Note (Signed)
Transition of Care [facility]) - Progression Note    Patient Details  Name: Tracy Todd MRN: [id number] Date of Birth: [birth year]  Transition of Care [facility]) CM/SW Contact  Suraiya Dickerson, Meriam Sprague, RN Phone Number: 06/29/2020, 3:37 PM  Clinical Narrative:     Spoke with Sharlett Iles from [place]. Awaiting work up of level II PASRR. Will give SNF bed offers to sister once PASRR is received.  Expected Discharge Plan: Skilled Nursing Facility Barriers to Discharge: Awaiting State Approval [facility])  Expected Discharge Plan and Services Expected Discharge Plan: Skilled Nursing Facility       Living arrangements for the past 2 months: Single Family Home                   Social Determinants of Health (SDOH) Interventions    Readmission Risk Interventions No flowsheet data found.

## 2020-06-30 ENCOUNTER — Inpatient Hospital Stay (HOSPITAL_COMMUNITY): Payer: Medicare Other

## 2020-06-30 DIAGNOSIS — M869 Osteomyelitis, unspecified: Secondary | ICD-10-CM | POA: Diagnosis not present

## 2020-06-30 DIAGNOSIS — E1169 Type 2 diabetes mellitus with other specified complication: Secondary | ICD-10-CM | POA: Diagnosis not present

## 2020-06-30 LAB — COMPREHENSIVE METABOLIC PANEL
ALT: 8 U/L (ref 0–44)
AST: 27 U/L (ref 15–41)
Albumin: 2 g/dL — ABNORMAL LOW (ref 3.5–5.0)
Alkaline Phosphatase: 52 U/L (ref 38–126)
Anion gap: 6 (ref 5–15)
BUN: 15 mg/dL (ref 6–20)
CO2: 30 mmol/L (ref 22–32)
Calcium: 7.7 mg/dL — ABNORMAL LOW (ref 8.9–10.3)
Chloride: 100 mmol/L (ref 98–111)
Creatinine, Ser: 0.55 mg/dL — ABNORMAL LOW (ref 0.61–1.24)
GFR, Estimated: >60 mL/min (ref >60)
Glucose, Bld: 109 mg/dL — ABNORMAL HIGH (ref 70–99)
Potassium: 3.5 mmol/L (ref 3.5–5.1)
Sodium: 136 mmol/L (ref 135–145)
Total Bilirubin: 0.4 mg/dL (ref 0.3–1.2)
Total Protein: 5.2 g/dL — ABNORMAL LOW (ref 6.5–8.1)

## 2020-06-30 LAB — MAGNESIUM: Magnesium: 2 mg/dL (ref 1.7–2.4)

## 2020-06-30 MED ORDER — POTASSIUM CHLORIDE CRYS ER 20 MEQ PO TBCR
60.0000 meq | EXTENDED_RELEASE_TABLET | Freq: Once | ORAL | Status: AC
  Administered 2020-06-30: 60 meq via ORAL
  Filled 2020-06-30: qty 3

## 2020-06-30 MED ORDER — ALBUMIN HUMAN 5 % IV SOLN
12.5000 g | Freq: Once | INTRAVENOUS | Status: AC
  Administered 2020-06-30: 12.5 g via INTRAVENOUS
  Filled 2020-06-30: qty 250

## 2020-06-30 MED ORDER — LACTATED RINGERS IV BOLUS
1000.0000 mL | Freq: Once | INTRAVENOUS | Status: AC
  Administered 2020-06-30: 1000 mL via INTRAVENOUS

## 2020-06-30 NOTE — Progress Notes (Addendum)
eLink Physician-Brief Progress Note Patient Name: Tracy Todd DOB: 1966-03-29 MRN: [id number]   Date of Service  06/30/2020  HPI/Events of Note  Patient transferred from the floor secondary to hypotension, patient has a history of chronic osteomyelitis on home oral antibiotics but was due to have an iv put in for iv vancomycin + cefepime. A fluid bolus is in progress but patient had only received 300 ml of the bolus with normalization of the blood pressure when the measurement arm was changed.  eICU Interventions  Arterial line ordered for more reliable blood pressure measurement,  Vancomycin and Cefepime ordered, will complete the fluid bolus, PCCM ground crew arrived to evaluate the patient. New Patient Evaluation completed by me.        Thomasene Lot [name] 06/30/2020, 11:59 PM

## 2020-06-30 NOTE — Progress Notes (Signed)
Nutrition Follow-up  DOCUMENTATION CODES:   Underweight  INTERVENTION:   -Ensure Enlive po BID, each supplement provides 350 kcal and 20 grams of protein -Prostat BID ordered. Can replace with Prosource Plus PO BID, each provides 100 kcals and 15g protein -Juven Fruit Punch BID, each serving provides 95kcal and 2.5g of protein (amino acids glutamine and arginine)  NUTRITION DIAGNOSIS:   Increased nutrient needs related to wound healing as evidenced by estimated needs.  Ongoing.  GOAL:   Patient will meet greater than or equal to 90% of their needs  Progressing.  MONITOR:   PO intake, Supplement acceptance, Labs, Weight trends, I & O's, Skin  ASSESSMENT:   54 y.o. male,  history of seizures, recurrent syncope, hyperlipidemia, iron deficiency anemia, Down syndrome, and more who presents to the ER with chief complaint of fever and cough.  Patient is nonverbal and was not able to provide any history whatsoever, but chart review reveals the patient was experiencing cough and shortness of breath.  Patient with down syndrome, nonverbal. Pt with chronic nonhealing wounds per WOC note.   Patient currently consuming 50% of meals. Pt is drinking most of the supplements ordered to aid in wound healing.   Per MD note, pt is medically stable for discharge.   Admission weight: 90 lbs. No new weights recorded.   Labs reviewed. Medications: Niferex,  KLOR-CON, Vitamin B-12, Zinc sulfate  Diet Order:   Diet Order            Diet regular Room service appropriate? Yes; Fluid consistency: Thin  Diet effective now                 EDUCATION NEEDS:   No education needs have been identified at this time  Skin:  Skin Assessment: Skin Integrity Issues: Skin Integrity Issues:: Stage III Stage III: left hip, sacrum -chronic nonhealing per WOC note  Last BM:  11/9 -type 3 & 4  Height:   Ht Readings from Last 1 Encounters:  06/22/20 5' (1.524 m)    Weight:   Wt Readings from  Last 1 Encounters:  06/22/20 40.8 kg    BMI:  Body mass index is 17.58 kg/m.  Estimated Nutritional Needs:   Kcal:  1650-1850  Protein:  75-90g  Fluid:  1.8L/day  Tilda Franco, MS, RD, LDN Inpatient Clinical Dietitian Contact information available via Amion

## 2020-06-30 NOTE — Progress Notes (Signed)
Chaplain engaged in initial visit with Tracy Todd and his sister Tracy Todd.  Chaplain was able to learn about Tracy Todd's health journey and changes.  Tracy Todd also expressed the many transitions and grief that they have experienced within the last year.  They lost their mother to COVID last year.  Tracy Todd and [name] other sister serve as guardians over Tracy Todd and have developed a system for splitting time to see Tracy Todd.  Chaplain offered the ministries of presence, listening, and compassion.  Chaplain asked Tracy Todd how she has been taking care of herself during this time and she mentioned being outside and exercising.  Chaplain affirmed the benefits of being outside and the support that she offers Tracy Todd and the system they have created to take care of their brother.   Chaplain will continue to follow-up and check-in.     06/30/20 1200  Clinical Encounter Type  Visited With Patient and family together  Visit Type Initial

## 2020-06-30 NOTE — Progress Notes (Signed)
   06/30/20 1610  Assess: MEWS Score  BP 120/70  Pulse Rate 96  SpO2 95 %  Assess: MEWS Score  MEWS Temp 0  MEWS Systolic 0  MEWS Pulse 0  MEWS RR 0  MEWS LOC 2  MEWS Score 2  MEWS Score Color Yellow  Notify: Provider  Response See new orders  Date of Provider Response 06/30/20  Time of Provider Response 1613  Document  Patient Outcome Stabilized after interventions

## 2020-06-30 NOTE — Progress Notes (Signed)
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  Patient was followed by infectious disease clinic and supposed to have a PICC line placement and 6 weeks of IV antibiotics.  Currently on oral antibiotics with cefdinir and Flagyl. In the emergency room, temperature 99.7.  Heart rate 102.  Blood pressures fairly normal.  89% on room air. Lactic acid 1.5.  CT scan of the chest abdomen pelvis showed right lower lobe consolidation with mucoid material on the right lower lobe bronchus.  Decubitus ulceration, erosive changes of the sacrum.  Patient was treated as sepsis and admitted to the hospital.  Assessment & Plan:   Active Problems:   Pressure injury of skin   Sepsis (HCC)   Sepsis secondary to right lower lobe pneumonia with hypoxia:  -Suspect aspiration with both gram-positive and gram-negative bacteremia. -Pt had been on cefdinir and Flagyl as outpatient.   -Blood cultures were drawn and initially treated with cefepime and vancomycin -Patient is unable to follow any commands or instructions to do chest physiotherapy.   -SLP was earlier consulted and pt is now s/p modified barium swallow. Rec for regular solids with thin liquids -Pt is continued on ancef and flagyl as tolerated -This afternoon, pt noted to have acutely worsened sob and increased hypoxemic failure needing up to [facility] and temp of 100F -STAT CXR ordered, findings of persistent RLL consolidation  -Given acuity of symptoms, suspect re-aspiration although findings on CXR also suggestive of mucus plugging -Ensure head of bed is  elevated >30 degrees. Will re-consult SLP, Keep NPO for now  Chronic decubitus ulcer/sacral osteomyelitis: Present on admission.  Stage IV sacral decubitus. -Blood cx confirmed neg -PICC placed by IR for long-term abx, placed on 11/4 -Continued on ancef plus flagyl per above as per ID recs above  Hyperlipidemia:  -Continue statin as tolerated  Seizure disorder:  -Remains stable on Keppra.  Down syndrome/bedbound status/contractures and incontinence:  -Lives at home with family. -Plans noted for SNF placement. TOC following  Hypernatremia:  -Due to free water deficit.  Treated with isotonic fluid, normalized Recheck bmet in AM  Hypokalemia: -K this AM 3.5, will give KCl -Mg level appears within normal limits -Repeat levels in AM  Vtach -Had been hemodynamically stable -transiently noted on tele -Tolerating coreg  DVT prophylaxis: Heparin subq Code Status: Full Family Communication: Pt in room, family is at bedside  Status is: Inpatient  Remains inpatient appropriate because:Unsafe d/c plan   Dispo: The patient is from: Home              Anticipated d/c is to: SNF              Anticipated d/c date is: 2 days              Patient currently is medically stable to d/c. Just pending disposition  Consultants:   ID  IR  Procedures:   PICC placed under anesthesia 11/4 by IR  Antimicrobials: Anti-infectives (From admission, onward)   Start     Dose/Rate Route Frequency Ordered Stop   06/25/20 2200  ceFAZolin (ANCEF) IVPB 2g/100 mL premix  Status:  Discontinued  2 g 200 mL/hr over 30 Minutes Intravenous Every 8 hours 06/25/20 1004 06/25/20 1005   06/25/20 1500  metroNIDAZOLE (FLAGYL) tablet 500 mg        500 mg Oral Every 8 hours 06/25/20 1405     06/25/20 1100  ceFAZolin (ANCEF) IVPB 2g/100 mL premix        2 g 200 mL/hr over 30 Minutes Intravenous Every 8 hours 06/25/20 1005     06/24/20 1800  vancomycin (VANCOREADY) IVPB 750 mg/150 mL   Status:  Discontinued        750 mg 150 mL/hr over 60 Minutes Intravenous Every 24 hours 06/24/20 0938 06/24/20 1205   06/24/20 1700  ceFAZolin (ANCEF) IVPB 2g/100 mL premix  Status:  Discontinued        2 g 200 mL/hr over 30 Minutes Intravenous Every 12 hours 06/24/20 1232 06/25/20 1004   06/24/20 1300  metroNIDAZOLE (FLAGYL) IVPB 500 mg  Status:  Discontinued        500 mg 100 mL/hr over 60 Minutes Intravenous Every 8 hours 06/24/20 1205 06/25/20 1405   06/23/20 1800  vancomycin (VANCOCIN) IVPB 1000 mg/200 mL premix  Status:  Discontinued        1,000 mg 200 mL/hr over 60 Minutes Intravenous Every 24 hours 06/23/20 0325 06/24/20 0938   06/23/20 1000  levofloxacin (LEVAQUIN) tablet 750 mg  Status:  Discontinued        750 mg Oral Daily 06/22/20 2228 06/22/20 2228   06/23/20 0600  ceFEPIme (MAXIPIME) 2 g in sodium chloride 0.9 % 100 mL IVPB  Status:  Discontinued        2 g 200 mL/hr over 30 Minutes Intravenous Every 12 hours 06/23/20 0325 06/24/20 1205   06/22/20 1715  aztreonam (AZACTAM) 2 g in sodium chloride 0.9 % 100 mL IVPB  Status:  Discontinued        2 g 200 mL/hr over 30 Minutes Intravenous  Once 06/22/20 1700 06/22/20 1712   06/22/20 1715  metroNIDAZOLE (FLAGYL) IVPB 500 mg        500 mg 100 mL/hr over 60 Minutes Intravenous  Once 06/22/20 1700 06/22/20 2001   06/22/20 1715  vancomycin (VANCOCIN) IVPB 1000 mg/200 mL premix        1,000 mg 200 mL/hr over 60 Minutes Intravenous  Once 06/22/20 1700 06/22/20 2029   06/22/20 1715  ceFEPIme (MAXIPIME) 2 g in sodium chloride 0.9 % 100 mL IVPB        2 g 200 mL/hr over 30 Minutes Intravenous  Once 06/22/20 1712 06/22/20 1853      Subjective: This AM was in his usual state of health. This afternoon, noted to have rigors, fevers, more tired  Objective: Vitals:   06/30/20 1554 06/30/20 1555 06/30/20 1600 06/30/20 1610  BP:  (!) 108/56  120/70  Pulse:  (!) 106  96  Resp:      Temp:  100.1 F (37.8 C)    TempSrc:  Oral      SpO2: (!) 83% 93% 98% 95%  Weight:      Height:        Intake/Output Summary (Last 24 hours) at 06/30/2020 1703 Last data filed at 06/30/2020 1325 Gross per 24 hour  Intake 120 ml  Output 1400 ml  Net -1280 ml   Filed Weights   06/22/20 1713  Weight: 40.8 kg    Examination: General exam: Awake, laying in bed, in nad Respiratory system: Increased respiratory effort, no wheezing Cardiovascular system: regular  rate, s1, s2 Gastrointestinal system: Soft, nondistended, positive BS Central nervous system: CN2-12 grossly intact, strength intact Extremities: Perfused, no clubbing Skin: Normal skin turgor, no notable skin lesions seen Psychiatry: Unable to assess given mentation  Data Reviewed: I have personally reviewed following labs and imaging studies  CBC: Recent Labs  Lab 06/25/20 0613  WBC 6.8  HGB 10.9*  HCT 36.6*  MCV 104.6*  PLT 264   Basic Metabolic Panel: Recent Labs  Lab 06/26/20 0553 06/27/20 0400 06/28/20 0500 06/29/20 0519 06/29/20 0810 06/30/20 0640  NA 137 138 138 136  --  136  K 3.3* 4.3 3.9 3.9  --  3.5  CL 98 101 100 100  --  100  CO2 26 28 27 30   --  30  GLUCOSE 106* 101* 109* 116*  --  109*  BUN 10 12 11 16   --  15  CREATININE 0.53* 0.68 0.48* 0.52*  --  0.55*  CALCIUM 7.5* 8.1* 7.8* 7.5*  --  7.7*  MG 2.0 2.0  --   --  2.0 2.0   GFR: Estimated Creatinine Clearance: 60.2 mL/min (A) (by C-G formula based on SCr of 0.55 mg/dL (L)). Liver Function Tests: Recent Labs  Lab 06/26/20 0553 06/27/20 0400 06/28/20 0500 06/29/20 0519 06/30/20 0640  AST 22 20 17 17 27   ALT 14 10 7 6 8   ALKPHOS 47 50 53 53 52  BILITOT 0.5 0.4 0.4 0.2* 0.4  PROT 5.2* 5.5* 5.7* 5.4* 5.2*  ALBUMIN 2.2* 2.3* 2.4* 2.1* 2.0*   No results for input(s): LIPASE, AMYLASE in the last 168 hours. No results for input(s): AMMONIA in the last 168 hours. Coagulation Profile: No results for input(s): INR, PROTIME in the last 168 hours. Cardiac Enzymes: No results for  input(s): CKTOTAL, CKMB, CKMBINDEX, TROPONINI in the last 168 hours. BNP (last 3 results) No results for input(s): PROBNP in the last 8760 hours. HbA1C: No results for input(s): HGBA1C in the last 72 hours. CBG: No results for input(s): GLUCAP in the last 168 hours. Lipid Profile: No results for input(s): CHOL, HDL, LDLCALC, TRIG, CHOLHDL, LDLDIRECT in the last 72 hours. Thyroid Function Tests: No results for input(s): TSH, T4TOTAL, FREET4, T3FREE, THYROIDAB in the last 72 hours. Anemia Panel: No results for input(s): VITAMINB12, FOLATE, FERRITIN, TIBC, IRON, RETICCTPCT in the last 72 hours. Sepsis Labs: No results for input(s): PROCALCITON, LATICACIDVEN in the last 168 hours.  Recent Results (from the past 240 hour(s))  Blood Culture (routine x 2)     Status: None   Collection Time: 06/22/20  5:55 PM   Specimen: BLOOD  Result Value Ref Range Status   Specimen Description   Final    BLOOD RIGHT ANTECUBITAL Performed at [facility], [address] [place], [place] [address]    Special Requests   Final    BOTTLES DRAWN AEROBIC AND ANAEROBIC Blood Culture results may not be optimal due to an inadequate volume of blood received in culture bottles Performed at [facility], [address] [place], [place] [address]    Culture   Final    NO GROWTH 5 DAYS Performed at [facility], [address] [place], [place] [address]    Report Status 06/27/2020 FINAL  Final  Respiratory Panel by RT PCR (Flu A&B, Covid) - Nasopharyngeal Swab     Status: None   Collection Time: 06/22/20  5:55 PM   Specimen: Nasopharyngeal Swab  Result Value Ref Range Status  SARS Coronavirus 2 by RT PCR NEGATIVE NEGATIVE Final    Comment: (NOTE) SARS-CoV-2 target nucleic acids are NOT DETECTED.  The SARS-CoV-2 RNA is generally detectable in upper respiratoy specimens during the acute phase of infection. The lowest concentration of SARS-CoV-2 viral copies this  assay can detect is 131 copies/mL. A negative result does not preclude SARS-Cov-2 infection and should not be used as the sole basis for treatment or other patient management decisions. A negative result may occur with  improper specimen collection/handling, submission of specimen other than nasopharyngeal swab, presence of viral mutation(s) within the areas targeted by this assay, and inadequate number of viral copies (<131 copies/mL). A negative result must be combined with clinical observations, patient history, and epidemiological information. The expected result is Negative.  Fact Sheet for Patients:  https://www.moore.com/  Fact Sheet for Healthcare Providers:  https://www.young.biz/  This test is no t yet approved or cleared by the [place] FDA and  has been authorized for detection and/or diagnosis of SARS-CoV-2 by FDA under an Emergency Use Authorization (EUA). This EUA will remain  in effect (meaning this test can be used) for the duration of the COVID-19 declaration under Section 564(b)(1) of the Act, 21 U.S.C. section 360bbb-3(b)(1), unless the authorization is terminated or revoked sooner.     Influenza A by PCR NEGATIVE NEGATIVE Final   Influenza B by PCR NEGATIVE NEGATIVE Final    Comment: (NOTE) The Xpert Xpress SARS-CoV-2/FLU/RSV assay is intended as an aid in  the diagnosis of influenza from Nasopharyngeal swab specimens and  should not be used as a sole basis for treatment. Nasal washings and  aspirates are unacceptable for Xpert Xpress SARS-CoV-2/FLU/RSV  testing.  Fact Sheet for Patients: https://www.moore.com/  Fact Sheet for Healthcare Providers: https://www.young.biz/  This test is not yet approved or cleared by the [place] FDA and  has been authorized for detection and/or diagnosis of SARS-CoV-2 by  FDA under an Emergency Use Authorization (EUA). This EUA will  remain  in effect (meaning this test can be used) for the duration of the  Covid-19 declaration under Section 564(b)(1) of the Act, 21  U.S.C. section 360bbb-3(b)(1), unless the authorization is  terminated or revoked. Performed at [facility], [address], [place], [place] [address]   Blood Culture (routine x 2)     Status: None   Collection Time: 06/22/20  6:00 PM   Specimen: BLOOD  Result Value Ref Range Status   Specimen Description   Final    BLOOD LEFT ANTECUBITAL Performed at [facility], [address], [place], [place] [address]    Special Requests   Final    BOTTLES DRAWN AEROBIC AND ANAEROBIC Blood Culture adequate volume Performed at [facility], [address], [place], [place] [address]    Culture   Final    NO GROWTH 5 DAYS Performed at [facility], [address] [place], [place] [address]    Report Status 06/27/2020 FINAL  Final  Urine culture     Status: Abnormal   Collection Time: 06/22/20 10:24 PM   Specimen: In/Out Cath Urine  Result Value Ref Range Status   Specimen Description   Final    IN/OUT CATH URINE Performed at [facility], [address], [place], [place] [address]    Special Requests   Final    NONE Performed at [facility], [address], [place], [place] [address]    Culture MULTIPLE  SPECIES PRESENT, SUGGEST RECOLLECTION (A)  Final   Report Status 06/23/2020 FINAL  Final  MRSA PCR Screening     Status: None   Collection Time: 06/23/20 10:46 AM   Specimen: Nasal Mucosa; Nasopharyngeal  Result Value Ref Range Status   MRSA by PCR NEGATIVE NEGATIVE Final    Comment:        The GeneXpert MRSA Assay (FDA approved for NASAL specimens only), is one component of a comprehensive MRSA colonization surveillance program. It is not intended to diagnose MRSA infection nor to guide or monitor treatment for MRSA infections. Performed  at [facility], [address], [place], [place] [address]      Radiology Studies: DG CHEST PORT 1 VIEW  Result Date: 06/30/2020 CLINICAL DATA:  Seizures, syncope, osteomyelitis EXAM: PORTABLE CHEST 1 VIEW COMPARISON:  06/22/2020 FINDINGS: Single frontal view of the chest demonstrates an unremarkable cardiac silhouette. Left-sided PICC tip projects over the atrial caval junction. Persistent patchy consolidation within the medial right lung base. No effusion or pneumothorax. No acute bony abnormalities. IMPRESSION: 1. Persistent right lower lobe consolidation, not appreciably changed since prior CT. Favor mucous plugging/atelectasis over aspiration or infection. Electronically Signed   By: Sharlet Salina M.D.   On: 06/30/2020 17:00    Scheduled Meds: . aspirin EC  81 mg Oral Daily  . carvedilol  6.25 mg Oral BID WC  . Chlorhexidine Gluconate Cloth  6 each Topical Daily  . feeding supplement  237 mL Oral BID BM  . feeding supplement (PRO-STAT SUGAR FREE 64)  30 mL Oral BID  . finasteride  5 mg Oral Daily  . heparin  5,000 Units Subcutaneous Q8H  . iron polysaccharides  150 mg Oral Daily  . levETIRAcetam  500 mg Oral BID  . loratadine  10 mg Oral Daily  . metroNIDAZOLE  500 mg Oral Q8H  . nutrition supplement (JUVEN)  1 packet Oral BID BM  . pravastatin  40 mg Oral Daily  . selenium sulfide   Topical Daily  . vitamin B-12  500 mcg Oral Daily  . zinc sulfate  220 mg Oral Daily   Continuous Infusions: .  ceFAZolin (ANCEF) IV 2 g (06/30/20 1325)     LOS: 8 days   Rickey Barbara, MD [facility] Pager On Amion  If 7PM-7AM, please contact night-coverage 06/30/2020, 5:03 PM

## 2020-07-01 ENCOUNTER — Inpatient Hospital Stay: Payer: Self-pay

## 2020-07-01 DIAGNOSIS — L89304 Pressure ulcer of unspecified buttock, stage 4: Secondary | ICD-10-CM | POA: Diagnosis not present

## 2020-07-01 DIAGNOSIS — A419 Sepsis, unspecified organism: Secondary | ICD-10-CM | POA: Diagnosis not present

## 2020-07-01 LAB — COMPREHENSIVE METABOLIC PANEL
ALT: 7 U/L (ref 0–44)
AST: 28 U/L (ref 15–41)
Albumin: 2.4 g/dL — ABNORMAL LOW (ref 3.5–5.0)
Alkaline Phosphatase: 47 U/L (ref 38–126)
Anion gap: 3 — ABNORMAL LOW (ref 5–15)
BUN: 16 mg/dL (ref 6–20)
CO2: 27 mmol/L (ref 22–32)
Calcium: 7.5 mg/dL — ABNORMAL LOW (ref 8.9–10.3)
Chloride: 105 mmol/L (ref 98–111)
Creatinine, Ser: 0.39 mg/dL — ABNORMAL LOW (ref 0.61–1.24)
GFR, Estimated: >60 mL/min (ref >60)
Glucose, Bld: 114 mg/dL — ABNORMAL HIGH (ref 70–99)
Potassium: 3.8 mmol/L (ref 3.5–5.1)
Sodium: 135 mmol/L (ref 135–145)
Total Bilirubin: 0.3 mg/dL (ref 0.3–1.2)
Total Protein: 5.2 g/dL — ABNORMAL LOW (ref 6.5–8.1)

## 2020-07-01 LAB — CBC
HCT: 32.9 % — ABNORMAL LOW (ref 39.0–52.0)
Hemoglobin: 9.9 g/dL — ABNORMAL LOW (ref 13.0–17.0)
MCH: 31 pg (ref 26.0–34.0)
MCHC: 30.1 g/dL (ref 30.0–36.0)
MCV: 103.1 fL — ABNORMAL HIGH (ref 80.0–100.0)
Platelets: 236 10*3/uL (ref 150–400)
RBC: 3.19 MIL/uL — ABNORMAL LOW (ref 4.22–5.81)
RDW: 17.8 % — ABNORMAL HIGH (ref 11.5–15.5)
WBC: 3.3 10*3/uL — ABNORMAL LOW (ref 4.0–10.5)
nRBC: 0 % (ref 0.0–0.2)

## 2020-07-01 LAB — PROCALCITONIN: Procalcitonin: 0.41 ng/mL

## 2020-07-01 LAB — LACTIC ACID, PLASMA
Lactic Acid, Venous: 0.9 mmol/L (ref 0.5–1.9)
Lactic Acid, Venous: 0.8 mmol/L (ref 0.5–1.9)

## 2020-07-01 LAB — MAGNESIUM: Magnesium: 2 mg/dL (ref 1.7–2.4)

## 2020-07-01 MED ORDER — LACTATED RINGERS IV SOLN: INTRAVENOUS | Status: AC

## 2020-07-01 MED ORDER — SODIUM CHLORIDE 0.9 % IV SOLN: 2.0000 g | Freq: Two times a day (BID) | INTRAVENOUS | Status: DC

## 2020-07-01 MED ORDER — SODIUM CHLORIDE 0.9 % IV SOLN: 1.0000 g | Freq: Three times a day (TID) | INTRAVENOUS | Status: DC

## 2020-07-01 MED ORDER — LACTATED RINGERS IV BOLUS
1000.0000 mL | Freq: Once | INTRAVENOUS | Status: AC
  Administered 2020-07-01: 1000 mL via INTRAVENOUS

## 2020-07-01 MED ORDER — VANCOMYCIN HCL 750 MG/150ML IV SOLN
750.0000 mg | INTRAVENOUS | Status: DC
  Administered 2020-07-02 – 2020-07-03 (×2): 750 mg via INTRAVENOUS
  Filled 2020-07-01 (×2): qty 150

## 2020-07-01 MED ORDER — SODIUM CHLORIDE 0.9 % IV SOLN
2.0000 g | Freq: Two times a day (BID) | INTRAVENOUS | Status: DC
  Administered 2020-07-01 – 2020-07-03 (×6): 2 g via INTRAVENOUS
  Filled 2020-07-01 (×6): qty 2

## 2020-07-01 MED ORDER — VANCOMYCIN HCL IN DEXTROSE 1-5 GM/200ML-% IV SOLN
1000.0000 mg | Freq: Once | INTRAVENOUS | Status: AC
  Administered 2020-07-01: 1000 mg via INTRAVENOUS
  Filled 2020-07-01: qty 200

## 2020-07-01 MED ORDER — ORAL CARE MOUTH RINSE
15.0000 mL | Freq: Two times a day (BID) | OROMUCOSAL | Status: DC
  Administered 2020-07-01 – 2020-07-20 (×33): 15 mL via OROMUCOSAL

## 2020-07-01 MED ORDER — NOREPINEPHRINE 4 MG/250ML-% IV SOLN
0.0000 ug/min | INTRAVENOUS | Status: DC
  Filled 2020-07-01: qty 250

## 2020-07-01 MED ORDER — SODIUM CHLORIDE 0.9 % IV SOLN: 2.0000 g | Freq: Three times a day (TID) | INTRAVENOUS | Status: DC

## 2020-07-01 MED ORDER — LEVETIRACETAM IN NACL 500 MG/100ML IV SOLN
500.0000 mg | Freq: Two times a day (BID) | INTRAVENOUS | Status: DC
  Administered 2020-07-01 – 2020-07-04 (×7): 500 mg via INTRAVENOUS
  Filled 2020-07-01 (×7): qty 100

## 2020-07-01 MED ORDER — VANCOMYCIN HCL 1000 MG IV SOLR: 1000.0000 mg | Freq: Two times a day (BID) | INTRAVENOUS | Status: DC

## 2020-07-01 NOTE — Progress Notes (Addendum)
NAME:  Tracy Todd, MRN:  [id number], DOB:  1966/08/12, LOS: 9 ADMISSION DATE:  06/22/2020, CONSULTATION DATE:  07/01/2020 REFERRING MD:  Dr Rhona Leavens, CHIEF COMPLAINT: Hypotension  Brief History   54 year old male that presented to [facility] for increasing shortness of breath and found to have aspiration pneumonia and osteomyelitis.   During this hospital stay the patient had been evaluated for speech therapy and had a modified barium swallow procedure done.  He was recommended to have a regular thin liquid diet.  Patient condition was improving until early am 11/10 when he developed fevers and then hypotension.  There is a significant disparity between his upper extremity and lower extremity for blood pressure.  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 Seizure disorder Hyperlipidemia Iron deficiency anemia Arthritis Sacral osteomyelitis Left greater trochanter osteomyelitis  Consults:  PICC team  Procedures:    Micro Data:  COVID 11/1 >> negative  Influenza 11/1 >> negative  UC 11/1 >> multiple species BCx2 11/1 >> negative  BCx2 11/9 >>   Antimicrobials:  Aztreonam 11/1 x1  Flagyl 11/1 >> 11/10 Cefazolin 11/4 >>11/10 Vancomycin 11/10 >> Cefepime11/10 >>  Subjective:  Transferred to SDU overnight  Not on pressors, BP stable  Tmax 102.1   Sister at bedside, asking to make patient DNR/DNI  Objective   Blood pressure 129/63, pulse (!) 58, temperature (!) 97.5 F (36.4 C), temperature source Axillary, resp. rate 14, height (P) 5' (1.524 m), weight (P) 45.4 kg, SpO2 100 %.        Intake/Output Summary (Last 24 hours) at 07/01/2020 0953 Last data filed at 07/01/2020 0600 Gross per 24 hour  Intake 2067.97 ml  Output 1200 ml  Net 867.97 ml    Filed Weights   06/22/20 1713 07/01/20 0000  Weight: 40.8 kg (P) 45.4 kg    Examination: General: adult male with Down's Syndrome sitting up in bed in NAD, sister at bedside HEENT: MM pink/moist, anicteric Neuro: Awake, alert, using his arm board as pretend gun to shoot, moves upper extremities, lower extremities contracted CV: s1s2 rrr, no m/r/g PULM: non-labored on  O2, sats 100%, lungs bilaterally clear anterior, diminished right base  GI: soft, bsx4 active  Extremities: warm/dry, no edema  Skin: sacral and left trochanter dressing intact   Assessment & Plan:   Hypotension / Presumed Sepsis (though possible medication component with beta blocker) Suspected source of aspiration PNA.  Wounds unchanged.  Lactic acid cleared.  Suspect acute event was aspiration PNA.  Repeat blood cultures pending.  -continue broad spectrum abx, likely can narrow 11/11 -follow repeat cultures -HOLD coreg, was started for rhythm / may need lower dose  -discontinue arterial line in afternoon if BP remains stable  -discontinue levophed from [facility]  -LR at 25ml/hr for 12 hours then KVO   RLL Aspiration PNA -abx as above  -follow intermittent CXR  -SLP following, appreciate input  -discussed concept of accepting risk of aspiration to allow for comfort with eating with sister    Osteomyelitis sacrum and left trochanter -PICC line in place -abx per ID rec's  Down syndrome -supportive care  Iron deficiency & B12 Anemia -B12, iron supplementation   Hyperlipidemia -per primary   Best practice:  Diet: NPO Pain/Anxiety/Delirium protocol (if indicated):  VAP protocol (if indicated): N/A  DVT prophylaxis: Heparin GI prophylaxis: Regular diet Glucose control: Monitor blood sugar  mobility: Bedrest Code Status: DNR/DNI Family Communication: Sister updated at bedside 11/10.  Extensive discussion about overall plan of care for [facility].  She requested DNR/DNI which I believe is in his best interest.   We discussed options on MOST form. His sister would like to take some time to think about it.  I offered different scenarios to help with thought process to include: allowing him to eat anything and accepting the risk of aspiration for comfort / quality of life, not sending patient to hospital vs sending and what treatments, treating with IVF/ABX for infection, limiting the degree of painful interventions for Erric, and promoting all measures that add to his quality of life > food / nutrition, PT etc.   Disposition: SDU.  PCCM will be available PRN.   Labs   CBC: Recent Labs  Lab 06/25/20 0613 07/01/20 0520  WBC 6.8 3.3*  HGB 10.9* 9.9*  HCT 36.6* 32.9*  MCV 104.6* 103.1*  PLT 264 236    Basic Metabolic Panel: Recent Labs  Lab 06/26/20 0553 06/26/20 0553 06/27/20 0400 06/28/20 0500 06/29/20 0519 06/29/20 0810 06/30/20 0640 07/01/20 0520  NA 137   < > 138 138 136  --  136 135  K 3.3*   < > 4.3 3.9 3.9  --  3.5 3.8  CL 98   < > 101 100 100  --  100 105  CO2 26   < > 28 27 30   --  30 27  GLUCOSE 106*   < > 101* 109* 116*  --  109* 114*  BUN 10   < > 12 11 16   --  15 16  CREATININE 0.53*   < > 0.68 0.48* 0.52*  --  0.55* 0.39*  CALCIUM 7.5*   < > 8.1* 7.8* 7.5*  --  7.7* 7.5*  MG 2.0  --  2.0  --   --  2.0 2.0 2.0   < > = values in this interval not displayed.   GFR: Estimated Creatinine Clearance: 60.2 mL/min (A) (by C-G formula based on SCr of 0.39 mg/dL (L)). Recent Labs  Lab 06/25/20 0613 07/01/20 0132 07/01/20 0520  PROCALCITON  --  0.41  --   WBC 6.8  --  3.3*  LATICACIDVEN  --  0.9 0.8    Liver Function Tests: Recent Labs  Lab 06/27/20 0400 06/28/20 0500 06/29/20 0519 06/30/20 0640 07/01/20 0520  AST 20 17 17 27 28   ALT 10 7 6 8 7   ALKPHOS 50 53 53 52 47  BILITOT 0.4 0.4 0.2* 0.4 0.3  PROT 5.5* 5.7* 5.4* 5.2* 5.2*  ALBUMIN 2.3* 2.4* 2.1* 2.0* 2.4*   No results for input(s): LIPASE, AMYLASE in the last 168 hours. No results for input(s): AMMONIA in  the last 168 hours.  ABG    Component Value Date/Time   TCO2 31 10/12/2019 1104     Coagulation Profile: No results for input(s): INR, PROTIME in the last 168 hours.  Cardiac Enzymes: No results for input(s): CKTOTAL, CKMB, CKMBINDEX, TROPONINI in the last 168 hours.  HbA1C: Hgb A1c MFr Bld  Date/Time Value Ref Range Status  12/02/2019 10:06 AM 5.3 4.8 - 5.6 % Final    Comment:    (NOTE)         Prediabetes: 5.7 - 6.4         Diabetes: >6.4         Glycemic control  for adults with diabetes: <7.0     CBG: No results for input(s): GLUCAP in the last 168 hours.   Critical care time: 33 minutes     Canary Brim, MSN, NP-C, AGACNP-BC [facility] 07/01/2020, 9:54 AM   Please see [email or web address] for pager details.

## 2020-07-01 NOTE — Consult Note (Signed)
NAME:  Tracy Todd, MRN:  [id number], DOB:  [birth year], LOS: 9 ADMISSION DATE:  06/22/2020, CONSULTATION DATE:  07/01/2020 REFERRING MD:  Dr Rhona Leavens, CHIEF COMPLAINT: Hypotension  Brief History   54 year old male that presented to [facility] for increasing shortness of breath and found to have aspiration pneumonia and osteomyelitis.  History of present illness   This is a 54 year old white male who presented to [facility] for increasing shortness of breath and fevers on 06/23/2020.  Patient has steadily declined since April of this year.  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  He was discharged to a SNF.  He acquired a large stage IV decubitus ulceration as well as a left trochanter ulceration.  These have developed into osteomyelitis.  The patient had been ambulatory until January 2021 when he slowly declined post Covid vaccine.  During this hospital stay the patient had been evaluated for speech therapy and had a modified barium swallow procedure done.  He was recommended to have a regular thin liquid diet.  Patient condition was improving until today when he developed fevers and then hypotension.  There is a significant disparity between his upper extremity and lower extremity for blood pressure.  He was given IV fluids by rapid response and transferred to the intensive care unit  Past Medical History  Down syndrome Seizure disorder Hyperlipidemia Iron deficiency anemia Arthritis Sacral osteomyelitis Left greater trochanter osteomyelitis   Consults:  PICC team  Procedures:  None   Micro Data:  Blood cultures pending Urine cultures multiple organisms seen questionable contamination  Antimicrobials:  Vancomycin Cefepime    Objective   Blood pressure (!) 61/36, pulse 85, temperature (!) 101.8 F (38.8 C), temperature source Oral, resp. rate 16, height 5' (1.524 m), weight 40.8  kg, SpO2 98 %.        Intake/Output Summary (Last 24 hours) at 07/01/2020 0012 Last data filed at 06/30/2020 1954 Gross per 24 hour  Intake 120 ml  Output 1300 ml  Net -1180 ml   Filed Weights   06/22/20 1713  Weight: 40.8 kg    Examination: General: No acute distress, shivering, not interacting with surroundings HENT: Mucous membranes are dry Lungs: Clear to auscultation no wheezing rales or rhonchi noted. Cardiovascular: Regular rate no rub murmur gallop Abdomen: Soft, nondistended, no rebound/rigidity/guarding the limited by neurologic status.  Positive bowel sounds. Extremities: Distal pulses intact x4.  No cyanosis or edema noted. Neuro: Eyes are open appears to track at times.  Does not follow commands.  He has contractures of all 4 extremities. GU: Large sacral decubitus ulceration with periulcerative erythema.   Assessment & Plan:  Hypotension presumed septic shock Aspiration pneumonia Osteomyelitis sacrum and left trochanter Down syndrome Iron deficiency anemia Hyperlipidemia  Plan: Patient was transferred to the intensive care unit for further work-up. Repeat lactic acid and perform procalcitonin level now. Have asked respiratory therapy to place arterial line for better evaluation of his blood pressure. Currently n.p.o. Appears intervascular depleted at this time.  Will give further lactated Ringer's IV fluids. Antibiotics have been broadened to vancomycin and cefepime Hold antihypertensive therapy. Monitor I's/O's   Best practice:  Diet: N.p.o. Pain/Anxiety/Delirium protocol (if indicated):  VAP protocol (if indicated): N/A DVT prophylaxis: Heparin GI prophylaxis: Regular diet Glucose control: Monitor blood sugar  mobility: Bedrest Code Status: Full code Family Communication:  Disposition: Transfer to the intensive care unit for further  work-up  Labs   CBC: Recent Labs  Lab 06/25/20 0613  WBC 6.8  HGB 10.9*  HCT 36.6*  MCV 104.6*  PLT 264      Basic Metabolic Panel: Recent Labs  Lab 06/26/20 0553 06/27/20 0400 06/28/20 0500 06/29/20 0519 06/29/20 0810 06/30/20 0640  NA 137 138 138 136  --  136  K 3.3* 4.3 3.9 3.9  --  3.5  CL 98 101 100 100  --  100  CO2 26 28 27 30   --  30  GLUCOSE 106* 101* 109* 116*  --  109*  BUN 10 12 11 16   --  15  CREATININE 0.53* 0.68 0.48* 0.52*  --  0.55*  CALCIUM 7.5* 8.1* 7.8* 7.5*  --  7.7*  MG 2.0 2.0  --   --  2.0 2.0   GFR: Estimated Creatinine Clearance: 60.2 mL/min (A) (by C-G formula based on SCr of 0.55 mg/dL (L)). Recent Labs  Lab 06/25/20 0613  WBC 6.8    Liver Function Tests: Recent Labs  Lab 06/26/20 0553 06/27/20 0400 06/28/20 0500 06/29/20 0519 06/30/20 0640  AST 22 20 17 17 27   ALT 14 10 7 6 8   ALKPHOS 47 50 53 53 52  BILITOT 0.5 0.4 0.4 0.2* 0.4  PROT 5.2* 5.5* 5.7* 5.4* 5.2*  ALBUMIN 2.2* 2.3* 2.4* 2.1* 2.0*   No results for input(s): LIPASE, AMYLASE in the last 168 hours. No results for input(s): AMMONIA in the last 168 hours.  ABG    Component Value Date/Time   TCO2 31 10/12/2019 1104     Coagulation Profile: No results for input(s): INR, PROTIME in the last 168 hours.  Cardiac Enzymes: No results for input(s): CKTOTAL, CKMB, CKMBINDEX, TROPONINI in the last 168 hours.  HbA1C: Hgb A1c MFr Bld  Date/Time Value Ref Range Status  12/02/2019 10:06 AM 5.3 4.8 - 5.6 % Final    Comment:    (NOTE)         Prediabetes: 5.7 - 6.4         Diabetes: >6.4         Glycemic control for adults with diabetes: <7.0     CBG: No results for input(s): GLUCAP in the last 168 hours.  Review of Systems:   Unable to provide review of systems secondary neurologic status.  Past Medical History  He,  has a past medical history of Arthritis, Down's syndrome, Headache, Hyperlipidemia, Iron deficiency anemia, Recurrent syncope, Seizure disorder (HCC) (05/21/2020), and Seizures (HCC).   Surgical History   History reviewed. No pertinent surgical history.    Social History   reports that he has never smoked. He has never used smokeless tobacco. He reports that he does not drink alcohol and does not use drugs.   Family History   His family history is not on file.   Allergies Allergies  Allergen Reactions  . Penicillins     Unknown Did it involve swelling of the face/tongue/throat, SOB, or low BP? Unk Did it involve sudden or severe rash/hives, skin peeling, or any reaction on the inside of your mouth or nose? Unk Did you need to seek medical attention at a hospital or doctor's office? Unk When did it last happen?childhood If all above answers are "NO", may proceed with cephalosporin use.       Home Medications  Prior to Admission medications   Medication Sig Start Date End Date Taking? Authorizing Provider  acetaminophen (TYLENOL) 500 MG tablet Take 500 mg by mouth  every 6 (six) hours as needed for moderate pain.  04/14/20 04/14/21 Yes [provider]  Amino Acids-Protein Hydrolys (FEEDING SUPPLEMENT, PRO-STAT SUGAR FREE 64,) LIQD Take 30 mLs by mouth 2 (two) times daily. To aid in wound healing; resident prefers Prostat mixed in water. 02/14/20  Yes [provider]  aspirin 81 MG EC tablet Take 1 tablet (81 mg total) by mouth daily. 05/21/20  Yes York SpanielWillis, Charles K, MD  benzocaine (AMERICAINE) 20 % oral spray Apply 1 application topically 3 (three) times daily as needed for throat irritation / pain.  03/18/20 03/18/21 Yes [provider]  bisacodyl (DULCOLAX) 10 MG suppository If not relieved by MOM, give 10 mg Bisacodyl suppositiory rectally X 1 dose in 24 hours as needed (Do not use constipation standing orders for residents with renal failure/CFR less than 30. Contact MD for orders) (Physician Order)  02/13/20  Yes [provider]  cefdinir (OMNICEF) 300 MG capsule Take 1 capsule (300 mg total) by mouth 2 (two) times daily. 06/12/20  Yes Odette FractionManandhar, Sabina, MD  cetirizine (ZYRTEC) 10 MG tablet Take 10  mg by mouth daily as needed for allergies.    Yes [provider]  diclofenac Sodium (VOLTAREN) 1 % GEL Apply 2 g topically 4 (four) times daily. To both knees 03/03/20  Yes Ngetich, Dinah C, NP  Ensure Plus (ENSURE PLUS) LIQD Take 237 mLs by mouth 2 (two) times daily between meals.    Yes [provider]  FIBER ADULT GUMMIES PO Take 1 tablet by mouth in the morning and at bedtime.    Yes [provider]  finasteride (PROSCAR) 5 MG tablet Take 5 mg by mouth daily. 04/30/20  Yes [provider]  hydrocortisone 2.5 % cream Apply 1 application topically 2 (two) times daily as needed (skin rash).  04/14/20 04/14/21 Yes [provider]  iron polysaccharides (NU-IRON) 150 MG capsule Take 1 capsule (150 mg total) by mouth daily. 03/03/20  Yes Ngetich, Dinah C, NP  ketoconazole (NIZORAL) 2 % shampoo Apply 1 application topically See admin instructions. Use to wash hair and face, alternate by week. 03/03/20  Yes Ngetich, Dinah C, NP  levETIRAcetam (KEPPRA) 500 MG tablet Take 1 tablet (500 mg total) by mouth 2 (two) times daily. 03/03/20  Yes Ngetich, Dinah C, NP  magnesium hydroxide (MILK OF MAGNESIA) 400 MG/5ML suspension If no BM in 3 days, give 30 cc Milk of Magnesium p.o. x 1 dose in 24 hours as needed (Do not use standing constipation orders for residents with renal failure CFR less than 30. Contact MD for orders) (Physician Order)  02/13/20  Yes [provider]  Multiple Vitamins-Minerals (MULTIVITAMIN WITH MINERALS) tablet Take 1 tablet by mouth daily.   Yes [provider]  nutrition supplement, JUVEN, (JUVEN) PACK Take 1 packet by mouth 2 (two) times daily between meals. To aid in wound healing. 02/14/20  Yes [provider]  pravastatin (PRAVACHOL) 40 MG tablet Take 1 tablet (40 mg total) by mouth daily. 03/03/20  Yes Ngetich, Dinah C, NP  SANTYL ointment Apply 1 application topically daily.  04/14/20  Yes [provider]  selenium  sulfide (SELSUN) 1 % LOTN Apply 1 application topically daily. 03/03/20  Yes Ngetich, Dinah C, NP  Sodium Phosphates (RA SALINE ENEMA RE) If not relieved by Biscodyl suppository, give disposable Saline Enema rectally X 1 dose/24 hrs as needed (Do not use constipation standing orders for residents with renal failure/CFR less than 30. Contact MD for orders)(Physician Or  02/13/20  Yes [provider]  vitamin B-12 (CYANOCOBALAMIN) 500 MCG tablet Take 1 tablet (500 mcg total) by mouth daily. 03/03/20  Yes Ngetich, Dinah C, NP  zinc gluconate 50 MG tablet Take 1 tablet (50 mg total) by mouth daily. 03/03/20  Yes Ngetich, Dinah C, NP  Disposable Gloves MISC Use gloves for diaper changes.Dispense 2 boxes 03/19/20   [provider]  Incontinence Supply Disposable (COMFORT SHIELD ADULT DIAPERS) MISC Dispense 5 boxes/mo. Use 4-6 times daily for fecal incontinence and diarrhea. 03/19/20   [provider]  Incontinence Supply Disposable (RA ADULT WIPES) MISC Please dispense 4 boxes/wipes per month. 03/19/20   [provider]  Incontinence Supply Disposable (UNDERPADS MEDIUM) MISC Dispense 3 boxes/mo for fecal and urinary incontinence and diarrhea. 03/19/20   [provider]  metroNIDAZOLE (FLAGYL) 500 MG tablet Take 1 tablet (500 mg total) by mouth 3 (three) times daily. 06/10/20   Odette Fraction, MD  Misc. Devices MISC Take by mouth.  03/18/20   [provider]  Misc. Devices MISC Manual wheelchair. 16 inch wide with elevating leg rests, high reclining back and neck support and 16-inch cushion. 03/18/20   [provider]  NON FORMULARY Regular liberalized diet d/t Dx Malnutrition.  02/14/20   [provider]  UNABLE TO FIND Med Name: anti dandruff shampoo    [provider]     Critical care time: 

## 2020-07-01 NOTE — Progress Notes (Addendum)
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 Patient was followed by infectious disease clinic and supposed to have a PICC line placement and 6 weeks of IV antibiotics. Currently on oral antibiotics with cefdinir and Flagyl. In the emergency room, temperature 99.7. Heart rate 102. Blood pressures fairly normal. 89% on room air. Lactic acid 1.5. CT scan of the chest abdomen pelvis showed right lower lobe consolidation with mucoid material on the right lower lobe bronchus. Decubitus ulceration, erosive changes of the sacrum. Patient was treated as sepsis and admitted to the hospital.  In the evening of 06/30/20 patient was transferred to stepdown unit due to severe hypotension, received IV fluid boluses, vasopressor was ordered, not started, currently off pressors.  07/01/20: Seen and examined with his sister at his bedside.  He is alert but minimally interactive.  Maintaining a map above 65 in the room without vasopressor.  Seen by PCCM, will follow as needed.  Assessment/Plan: Active Problems:   Pressure injury of skin   Sepsis (HCC)  Sepsis, improving, secondary to right lower lobe pneumonia with hypoxia:  -Suspect aspiration with both gram-positive and gram-negative bacteremia. -Pt had been on cefdinir and Flagyl as outpatient.  -Blood cultures were drawn and initially treated with cefepime and vancomycin - -seen by SLP, s/p modified barium swallow Started on dysphagia 1 thin liquid diet for moderate aspiration risk. Aspiration precautions Feeding  assistance  Chronic decubitus ulcer/sacral osteomyelitis: Present on admission. Stage IV sacral decubitus. -Blood cx confirmed neg -PICC placed by IR for long-term abx, placed on 11/4 -Currently on cefepime and IV vancomycin.  Dysphagia 1 Seen by speech therapist Management per speech therapist recommendation Aspiration precautions.  Moderate protein calorie malnutrition Continue to encourage increase in oral protein calorie intake Continue oral supplement  Hyperlipidemia:  -Continue statin as tolerated  Seizure disorder:  -Remains stable on Keppra.  Down syndrome/bedbound status/contractures and incontinence:  -Lives at home with family. -Plans noted for SNF placement. TOC following  Hypernatremia:  -Due to free water deficit. Treated with isotonic fluid, normalized Recheck bmet in AM  Resolved post repletion: Hypokalemia: -K this AM 3.8 -Mg level appears within normal limits  Transient Vtach -Had been hemodynamically stable -transiently noted on tele -Tolerating coreg  DVT prophylaxis: Heparin subq 3 times daily. Code Status: Full Family Communication:  Updated sister at bedside.  Status is: Inpatient  Remains inpatient appropriate because:Unsafe d/c plan   Dispo: The patient is from: Home  Anticipated d/c is to: SNF  Anticipated d/c date is: 2 days  Patient currently is medically stable to d/c. Just pending disposition  Consultants:   ID  IR  Procedures:   PICC placed under anesthesia 11/4 by IR    Status is: Inpatient   Dispo: The patient is from: Home.              Anticipated d/c is to: SNF.              Anticipated d/c date is: 07/03/2020.               Patient currently not appropriate for discharge due to ongoing treatment for sacral osteomyelitis.  Objective: Vitals:   07/01/20 1000 07/01/20 1100 07/01/20 1200 07/01/20 1300  BP: (!) 99/59 (!) 121/59  (!) 119/92   Pulse: 81 72 85 82  Resp: 16 14 18 19   Temp:   98.3 F (36.8 C)   TempSrc:   Axillary   SpO2: 100% 100% 93% 100%  Weight:      Height:        Intake/Output Summary (Last 24 hours) at 07/01/2020 1436 Last data filed at 07/01/2020 1200 Gross per 24 hour  Intake 2047.97 ml  Output 1100 ml  Net 947.97 ml   Filed Weights   06/22/20 1713 07/01/20 0000  Weight: 40.8 kg (P) 45.4 kg    Exam:  . General: 54 y.o. year-old male well developed well nourished in no acute distress.  Alert and minimally interactive. . Cardiovascular: Regular rate and rhythm with no rubs or gallops.  No thyromegaly or JVD noted.   53 Respiratory: Mild rales at bases no wheezing noted.  Poor respiratory effort.   . Abdomen: Soft nontender nondistended with normal bowel sounds. . Musculoskeletal: No lower extremity edema bilaterally. Marland Kitchen Psychiatry: Mood is appropriate for condition and setting   Data Reviewed: CBC: Recent Labs  Lab 06/25/20 0613 07/01/20 0520  WBC 6.8 3.3*  HGB 10.9* 9.9*  HCT 36.6* 32.9*  MCV 104.6* 103.1*  PLT 264 236   Basic Metabolic Panel: Recent Labs  Lab 06/26/20 0553 06/26/20 0553 06/27/20 0400 06/28/20 0500 06/29/20 0519 06/29/20 0810 06/30/20 0640 07/01/20 0520  NA 137   < > 138 138 136  --  136 135  K 3.3*   < > 4.3 3.9 3.9  --  3.5 3.8  CL 98   < > 101 100 100  --  100 105  CO2 26   < > 28 27 30   --  30 27  GLUCOSE 106*   < > 101* 109* 116*  --  109* 114*  BUN 10   < > 12 11 16   --  15 16  CREATININE 0.53*   < > 0.68 0.48* 0.52*  --  0.55* 0.39*  CALCIUM 7.5*   < > 8.1* 7.8* 7.5*  --  7.7* 7.5*  MG 2.0  --  2.0  --   --  2.0 2.0 2.0   < > = values in this interval not displayed.   GFR: Estimated Creatinine Clearance: 60.2 mL/min (A) (by C-G formula based on SCr of 0.39 mg/dL (L)). Liver Function Tests: Recent Labs  Lab 06/27/20 0400 06/28/20 0500 06/29/20 0519 06/30/20 0640 07/01/20 0520  AST 20 17 17 27 28   ALT 10 7 6 8 7   ALKPHOS 50 53 53 52 47   BILITOT 0.4 0.4 0.2* 0.4 0.3  PROT 5.5* 5.7* 5.4* 5.2* 5.2*  ALBUMIN 2.3* 2.4* 2.1* 2.0* 2.4*   No results for input(s): LIPASE, AMYLASE in the last 168 hours. No results for input(s): AMMONIA in the last 168 hours. Coagulation Profile: No results for input(s): INR, PROTIME in the last 168 hours. Cardiac Enzymes: No results for input(s): CKTOTAL, CKMB, CKMBINDEX, TROPONINI in the last 168 hours. BNP (last 3 results) No results for input(s): PROBNP in the last 8760 hours. HbA1C: No results for input(s): HGBA1C in the last 72 hours. CBG: No results for input(s): GLUCAP in the last 168 hours. Lipid Profile: No results for input(s): CHOL, HDL, LDLCALC, TRIG, CHOLHDL, LDLDIRECT in the last 72 hours. Thyroid Function Tests: No results for input(s): TSH, T4TOTAL, FREET4, T3FREE, THYROIDAB  in the last 72 hours. Anemia Panel: No results for input(s): VITAMINB12, FOLATE, FERRITIN, TIBC, IRON, RETICCTPCT in the last 72 hours. Urine analysis:    Component Value Date/Time   COLORURINE YELLOW 06/22/2020 2224   APPEARANCEUR CLEAR 06/22/2020 2224   LABSPEC 1.035 (H) 06/22/2020 2224   PHURINE 5.0 06/22/2020 2224   GLUCOSEU NEGATIVE 06/22/2020 2224   HGBUR NEGATIVE 06/22/2020 2224   BILIRUBINUR NEGATIVE 06/22/2020 2224   KETONESUR NEGATIVE 06/22/2020 2224   PROTEINUR NEGATIVE 06/22/2020 2224   NITRITE NEGATIVE 06/22/2020 2224   LEUKOCYTESUR TRACE (A) 06/22/2020 2224   Sepsis Labs: @LABRCNTIP (procalcitonin:4,lacticidven:4)  ) Recent Results (from the past 240 hour(s))  Blood Culture (routine x 2)     Status: None   Collection Time: 06/22/20  5:55 PM   Specimen: BLOOD  Result Value Ref Range Status   Specimen Description   Final    BLOOD RIGHT ANTECUBITAL Performed at [facility], [address] [place], [place]    Special Requests   Final    BOTTLES DRAWN AEROBIC AND ANAEROBIC Blood Culture results may not be optimal due to an inadequate volume of  blood received in culture bottles Performed at [facility], [address] [place], [place]    Culture   Final    NO GROWTH 5 DAYS Performed at [facility], [address] [place], [place]    Report Status 06/27/2020 FINAL  Final  Respiratory Panel by RT PCR (Flu A&B, Covid) - Nasopharyngeal Swab     Status: None   Collection Time: 06/22/20  5:55 PM   Specimen: Nasopharyngeal Swab  Result Value Ref Range Status   SARS Coronavirus 2 by RT PCR NEGATIVE NEGATIVE Final    Comment: (NOTE) SARS-CoV-2 target nucleic acids are NOT DETECTED.  The SARS-CoV-2 RNA is generally detectable in upper respiratoy specimens during the acute phase of infection. The lowest concentration of SARS-CoV-2 viral copies this assay can detect is 131 copies/mL. A negative result does not preclude SARS-Cov-2 infection and should not be used as the sole basis for treatment or other patient management decisions. A negative result may occur with  improper specimen collection/handling, submission of specimen other than nasopharyngeal swab, presence of viral mutation(s) within the areas targeted by this assay, and inadequate number of viral copies (<131 copies/mL). A negative result must be combined with clinical observations, patient history, and epidemiological information. The expected result is Negative.  Fact Sheet for Patients:  13/01/21  Fact Sheet for Healthcare Providers:  https://www.moore.com/  This test is no t yet approved or cleared by the https://www.young.biz/ FDA and  has been authorized for detection and/or diagnosis of SARS-CoV-2 by FDA under an Emergency Use Authorization (EUA). This EUA will remain  in effect (meaning this test can be used) for the duration of the COVID-19 declaration under Section 564(b)(1) of the Act, 21 U.S.C. section 360bbb-3(b)(1), unless the authorization is terminated  or revoked sooner.     Influenza A by PCR NEGATIVE NEGATIVE Final   Influenza B by PCR NEGATIVE NEGATIVE Final    Comment: (NOTE) The Xpert Xpress SARS-CoV-2/FLU/RSV assay is intended as an aid in  the diagnosis of influenza from Nasopharyngeal swab specimens and  should not be used as a sole basis for treatment. Nasal washings and  aspirates are unacceptable for Xpert Xpress SARS-CoV-2/FLU/RSV  testing.  Fact Sheet for Patients: [place]  Fact Sheet for Healthcare Providers: https://www.moore.com/  This test is not yet approved  or cleared by the [place] FDA and  has been authorized for detection and/or diagnosis of SARS-CoV-2 by  FDA under an Emergency Use Authorization (EUA). This EUA will remain  in effect (meaning this test can be used) for the duration of the  Covid-19 declaration under Section 564(b)(1) of the Act, 21  U.S.C. section 360bbb-3(b)(1), unless the authorization is  terminated or revoked. Performed at [facility], [address] [place], [place] [address]   Blood Culture (routine x 2)     Status: None   Collection Time: 06/22/20  6:00 PM   Specimen: BLOOD  Result Value Ref Range Status   Specimen Description   Final    BLOOD LEFT ANTECUBITAL Performed at [facility], [address] [place], [place] [address]    Special Requests   Final    BOTTLES DRAWN AEROBIC AND ANAEROBIC Blood Culture adequate volume Performed at [facility], [address] [place], [place] [address]    Culture   Final    NO GROWTH 5 DAYS Performed at [facility], [address] [place], [place] [address]    Report Status 06/27/2020 FINAL  Final  Urine culture     Status: Abnormal   Collection Time: 06/22/20 10:24 PM   Specimen: In/Out Cath Urine  Result Value Ref Range Status   Specimen Description   Final    IN/OUT CATH URINE Performed at [facility], [address] [place], [place] [address]    Special Requests   Final    NONE Performed at [facility], [address] [place], [place] [address]    Culture MULTIPLE SPECIES PRESENT, SUGGEST RECOLLECTION (A)  Final   Report Status 06/23/2020 FINAL  Final  MRSA PCR Screening     Status: None   Collection Time: 06/23/20 10:46 AM   Specimen: Nasal Mucosa; Nasopharyngeal  Result Value Ref Range Status   MRSA by PCR NEGATIVE NEGATIVE Final    Comment:        The GeneXpert MRSA Assay (FDA approved for NASAL specimens only), is one component of a comprehensive MRSA colonization surveillance program. It is not intended to diagnose MRSA infection nor to guide or monitor treatment for MRSA infections. Performed at [facility], [address] [place], [place] [address]   Culture, blood (routine x 2)     Status: None (Preliminary result)   Collection Time: 06/30/20  6:02 PM   Specimen: BLOOD LEFT HAND  Result Value Ref Range Status   Specimen Description   Final    BLOOD LEFT HAND Performed at [facility], [address] [place], [place] [address]    Special Requests   Final    BOTTLES DRAWN AEROBIC ONLY Blood Culture results may not be optimal due to an inadequate volume of blood received in culture bottles Performed at [facility], [address] [place], [place] [address]    Culture   Final    NO GROWTH < 12 HOURS Performed at [facility], [address] [place], [place] [address]    Report Status PENDING  Incomplete  Culture, blood (routine x 2)     Status: None (Preliminary result)   Collection Time: 06/30/20  6:02 PM   Specimen: BLOOD LEFT FOREARM  Result Value Ref Range Status   Specimen Description   Final    BLOOD LEFT FOREARM Performed at [facility],  [address], [place], [place] [address]    Special Requests   Final    BOTTLES  DRAWN AEROBIC ONLY Blood Culture adequate volume Performed at [facility], [address], [place], [place] [address]    Culture   Final    NO GROWTH < 12 HOURS Performed at [facility], [address], [place], [place] [address]    Report Status PENDING  Incomplete      Studies: DG CHEST PORT 1 VIEW  Result Date: 06/30/2020 CLINICAL DATA:  Seizures, syncope, osteomyelitis EXAM: PORTABLE CHEST 1 VIEW COMPARISON:  06/22/2020 FINDINGS: Single frontal view of the chest demonstrates an unremarkable cardiac silhouette. Left-sided PICC tip projects over the atrial caval junction. Persistent patchy consolidation within the medial right lung base. No effusion or pneumothorax. No acute bony abnormalities. IMPRESSION: 1. Persistent right lower lobe consolidation, not appreciably changed since prior CT. Favor mucous plugging/atelectasis over aspiration or infection. Electronically Signed   By: Sharlet Salina M.D.   On: 06/30/2020 17:00   [place] EKG SITE RITE  Result Date: 07/01/2020 If Site Rite image not attached, placement could not be confirmed due to current cardiac rhythm.   Scheduled Meds: . aspirin EC  81 mg Oral Daily  . Chlorhexidine Gluconate Cloth  6 each Topical Daily  . feeding supplement  237 mL Oral BID BM  . feeding supplement (PRO-STAT SUGAR FREE 64)  30 mL Oral BID  . finasteride  5 mg Oral Daily  . heparin  5,000 Units Subcutaneous Q8H  . iron polysaccharides  150 mg Oral Daily  . loratadine  10 mg Oral Daily  . nutrition supplement (JUVEN)  1 packet Oral BID BM  . pravastatin  40 mg Oral Daily  . selenium sulfide   Topical Daily  . vitamin B-12  500 mcg Oral Daily  . zinc sulfate  220 mg Oral Daily    Continuous Infusions: . ceFEPime (MAXIPIME) IV 2 g (07/01/20 1244)  . lactated ringers 50 mL/hr at 07/01/20 1243  . levETIRAcetam Stopped (07/01/20 1127)  . [START ON 07/02/2020] vancomycin       LOS: 9 days     Darlin Drop, MD [facility] Pager [phone]  If 7PM-7AM, please contact night-coverage [email or web address] Password [facility] 07/01/2020, 2:36 PM

## 2020-07-01 NOTE — Progress Notes (Signed)
BSE repeated, Full report to follow.  Recommend reinitiate puree/thin diet with strict precautions.  No indication of aspiration with po observed including pudding, pills with pudding and water.  Multiple swallows noted across all boluses and pt became sleepy during session.   Advised to only feed pt when fully alert, accepting,  using reverse trendelenburg position.        Informed Misty Stanley of chronicity of dysphagia, asp risk with pt's Down Syndrome and discussed re: code status with dysphagia. Misty Stanley states she thinks they want to change code to DNR, encouraged her to speak to MD re: this issue.    [name], MS [facility] SLP Acute Rehab Services Office [phone] Pager [phone]

## 2020-07-01 NOTE — TOC Progression Note (Signed)
Transition of Care [facility]) - Progression Note    Patient Details  Name: Tracy Todd MRN: [id number] Date of Birth: 1965/11/22  [facility] CM/SW Contact  Golda Acre, RN Phone Number: 07/01/2020, 7:44 AM  Clinical Narrative:    Pt with progressive decline in status as noted by decrease in alertness and significant decrease in blood pressure. An elevation in temp also noted with shakiness. Pt medicated with tylenol per order. Provider [name] made aware and rapid response contacted. Rapid response RN up on unit and assessed patient. Interventions carried out per order. Pt transferred to step down. [id number] Arterial Catheter Insertion [id number] Boluses for hypotensive state x1 1000cc lr. Plan is for longt Expected Discharge Plan: Skilled Nursing Facility Barriers to Discharge: Awaiting State Approval (PASRR) Plan: long term snf placmenmt Following for progression. Expected Discharge Plan and Services Expected Discharge Plan: Skilled Nursing Facility       Living arrangements for the past 2 months: Single Family Home                                       Social Determinants of Health (SDOH) Interventions    Readmission Risk Interventions No flowsheet data found.

## 2020-07-01 NOTE — Progress Notes (Signed)
Pharmacy Antibiotic Note  Tracy Nelles is a 54 y.o. male admitted on 06/22/2020 with history of seizures, recurrent syncope, hyperlipidemia, iron deficiency anemia, Down syndrome presented to ED with CC of fever, cough. Pt also has chronic non-healing sacral decubitus ulcer with OM was taking cefdinir + metronidazole PTA.  Patient known to pharmacy from previous dosing of Vancomycin and Cefepime, which were changed to Metronidazole and Cefazolin.  Today, 07/01/20  Patient transferred to ICU due to hypotension  CCM d/c'ed metronidazole and cefazolin and ordered vancomycin and cefepime per pharmacy dosing  Plan:  Vancomycin 1gm IV x 1 followed by 750mg  IV q24h for goal VT 15-20 and/or AUC 400-550  Continue cefepime 2 g IV q12h  Follow renal function and culture data  Height: 5' (152.4 cm) Weight: 40.8 kg (90 lb) IBW/kg (Calculated) : 50  Temp (24hrs), Avg:100.2 F (37.9 C), Min:99 F (37.2 C), Max:101.8 F (38.8 C)  Recent Labs  Lab 06/25/20 0613 06/25/20 0613 06/26/20 0553 06/27/20 0400 06/28/20 0500 06/29/20 0519 06/30/20 0640  WBC 6.8  --   --   --   --   --   --   CREATININE 0.51*   < > 0.53* 0.68 0.48* 0.52* 0.55*   < > = values in this interval not displayed.    Estimated Creatinine Clearance: 60.2 mL/min (A) (by C-G formula based on SCr of 0.55 mg/dL (L)).    Allergies  Allergen Reactions  . Penicillins     Unknown Did it involve swelling of the face/tongue/throat, SOB, or low BP? Unk Did it involve sudden or severe rash/hives, skin peeling, or any reaction on the inside of your mouth or nose? Unk Did you need to seek medical attention at a hospital or doctor's office? Unk When did it last happen?childhood If all above answers are "NO", may proceed with cephalosporin use.      Antimicrobials this admission: 11/1 vanc >> 11/3; resumed 11/10 >> 11/1 cefepime >> 11/3; resumed 11/10 >> 11/3 metronidazole >> 11/10 11/3 cefazolin >> 11/10  Dose  adjustments this admission: 11/3: vanc 1 g IV q24h --> 750 mg IV q24h  Microbiology results: 11/1 BCx: ng 11/1 UCx: multiple species, suggest recollection 11/2 MRSA PCR: negative  Thank you for allowing pharmacy to be a part of this patient's care.   13/2, PharmD 07/01/20 12:17 AM

## 2020-07-01 NOTE — Progress Notes (Signed)
Order for PICC noted. Luanne Bras RN notified that IR placed current PICC, and that IV Team would not be appropriate for replacement of that line due to contractures and inability to stay on back. Recommended that order for IR placement be entered.

## 2020-07-01 NOTE — Progress Notes (Signed)
Pt with progressive decline in status as noted by decrease in alertness and significant decrease in blood pressure. An elevation in temp also noted with shakiness. Pt medicated with tylenol per order. Provider [name] made aware and rapid response contacted. Rapid response RN up on unit and assessed patient. Interventions carried out per order. Pt transferred to step down.

## 2020-07-01 NOTE — Progress Notes (Addendum)
eLink Physician-Brief Progress Note Patient Name: Tracy Todd DOB: 1965-08-24 MRN: [id number]   Date of Service  07/01/2020  HPI/Events of Note  Persistent hypotension.  eICU Interventions  LR 1000 ml iv bolus ordered, order for PICC line placement entered. Peripheral Norepinephrine infusion ordered.        Madyn Ivins U Sangeeta Youse 07/01/2020, 2:10 AM

## 2020-07-01 NOTE — Procedures (Signed)
Arterial Catheter Insertion Procedure Note  Derell Bruun  [id number]  30-Nov-1965  Date:07/01/20  Time:1:15 AM    Provider Performing: Berton Bon    Procedure: Insertion of Arterial Line ([id number]) without US guidance  Indication(s) Blood pressure monitoring and/or need for frequent ABGs  Consent Unable to obtain consent due to emergent nature of procedure.  Anesthesia None   Time Out Verified patient identification, verified procedure, site/side was marked, verified correct patient position, special equipment/implants available, medications/allergies/relevant history reviewed, required imaging and test results available.   Sterile Technique Maximal sterile technique including full sterile barrier drape, hand hygiene, sterile gown, sterile gloves, mask, hair covering, sterile ultrasound probe cover (if used).   Procedure Description Area of catheter insertion was cleaned with chlorhexidine and draped in sterile fashion. Without real-time ultrasound guidance an arterial catheter was placed into the left radial artery.  Appropriate arterial tracings confirmed on monitor.     Complications/Tolerance None; patient tolerated the procedure well.   EBL Minimal   Specimen(s) None

## 2020-07-01 NOTE — Evaluation (Addendum)
Clinical/Bedside Swallow Evaluation Patient Details  Name: Tracy Todd MRN: [id number] Date of Birth: 08-10-1966  Today's Date: 07/01/2020 Time: SLP Start Time (ACUTE ONLY): 0915 SLP Stop Time (ACUTE ONLY): 0955 SLP Time Calculation (min) (ACUTE ONLY): 40 min  Past Medical History:  Past Medical History:  Diagnosis Date  . Arthritis   . Down's syndrome   . Headache   . Hyperlipidemia   . Iron deficiency anemia   . Recurrent syncope   . Seizure disorder (HCC) 05/21/2020  . Seizures (HCC)    Past Surgical History: History reviewed. No pertinent surgical history. HPI:  54yo male admitted 06/22/20 with fever and cough with cxr concerning for aspiration pna. PMH: seizures, recurrent syncope, HLD, iron deficiency anemia, Down Syndrome, nonverbal, bedbound last 7-8 months with steady and chronic decline in function.  Pt underwent BSE with recommendation for MBS due to pt's pna and coughing post-swallow of minimal intake he would accept.   Assessment / Plan / Recommendation Clinical Impression  Pt today continues with indications of dysphagia due to his Down Syndrome but unfortuantely, he also appears less engaged than during initial MBS and became sleepy during session after being sat up in bed.  SLP provided oral care using toothbrush and suction and sister was present for evaluation.  Pt accepted intake of water via straw, pudding and medication with pudding.  Lingual manipulation impaired *seen on MBS* and mild delay in swallow clinically present with pudding more than liquids.   Pt noted to have retained oropharyngeal secretion on most recent MBS - ? Secretion aspiration also present?    Pt needed significant assistance for optimal positoning with intake and he was slightly resistant to SLP holding his head up evidenced by pushing away slightly before calming.  No indication of aspiration noted with intake of approximately 9 boluses total - SLP ceased intake due to pt's mentation.     Discussed with pt's daughter Tracy Todd chronicity of dysphagia and episodic aspiration occuring due this Down Syndrome that may be mitigated but not prevented.  Tracy Todd notes that pt did not cough last week when she provided him intake following the MBS but states her sister was with him this weekend, so she is unaware of tolerance.    Prior to admit, pt had veen having progressive dysphagia and weight loss, thus his baseline dysphagia likely is exacerbated.  Recommend at this time to consider puree/thin diet to decrease effort for pt given current medical status/decline.  Demonstrated use of oral suction to sister to use if pt does not elicit swallow. Advised to only use at front of pt's mouth.    Recommend consider a palliative care consult given pt's progressive weight loss, dysphagia, decubitus, etc.    Will follow up for dysphagia management, to assure po tolerance and education completed with family.  Thanks. SLP Visit Diagnosis: Dysphagia, oropharyngeal phase (R13.12)    Aspiration Risk  Moderate aspiration risk    Diet Recommendation Dysphagia 1 (Puree);Thin liquid   Medication Administration: Whole meds with liquid (CRUSH IF CAPSULES or LARGE) Supervision: Staff to assist with self feeding;Full supervision/cueing for compensatory strategies Compensations: Minimize environmental distractions;Slow rate;Small sips/bites Postural Changes: Remain upright for at least 30 minutes after po intake (reverse trendelenburg position)    Other  Recommendations Oral Care Recommendations: Oral care BID   Follow up Recommendations 24 hour supervision/assistance      Frequency and Duration min 1 x/week  1 week;2 weeks       Prognosis Prognosis for Safe Diet Advancement:  Fair Barriers to Reach Goals: Cognitive deficits;Time post onset      Swallow Study   General Date of Onset: 06/22/20 HPI: 54yo male admitted 06/22/20 with fever and cough with cxr concerning for aspiration pna. PMH: seizures,  recurrent syncope, HLD, iron deficiency anemia, Down Syndrome, nonverbal, bedbound last 7-8 months with steady and chronic decline in function.  Pt underwent BSE with recommendation for MBS due to pt's pna and coughing post-swallow of minimal intake he would accept. Type of Study: Bedside Swallow Evaluation Previous Swallow Assessment: bse 11/2, MBS one week ago Diet Prior to this Study: NPO Respiratory Status: Nasal cannula History of Recent Intubation: No Behavior/Cognition: Lethargic/Drowsy;Doesn't follow directions;Requires cueing Oral Cavity Assessment: Other (comment) (difficult to visualize) Oral Care Completed by SLP: Yes (using toothbrush with oral suction set) Oral Cavity - Dentition: Adequate natural dentition Vision: Impaired for self-feeding Self-Feeding Abilities: Total assist Patient Positioning: Upright in bed (reverse trendelenburg) Baseline Vocal Quality: Normal (single words with strong phonation, strained however - consistent with vocal pattern of Down Syndrome) Volitional Cough: Cognitively unable to elicit Volitional Swallow: Unable to elicit    Oral/Motor/Sensory Function Overall Oral Motor/Sensory Function: Within functional limits (unable to assess)   Ice Chips Ice chips: Not tested   Thin Liquid Thin Liquid: Impaired Presentation: Straw Oral Phase Impairments: Reduced lingual movement/coordination Oral Phase Functional Implications: Other (comment) Pharyngeal  Phase Impairments: Multiple swallows    Nectar Thick Nectar Thick Liquid: Not tested   Honey Thick Honey Thick Liquid: Not tested   Puree Puree: Impaired Presentation: Spoon Oral Phase Impairments: Reduced lingual movement/coordination Oral Phase Functional Implications: Prolonged oral transit Pharyngeal Phase Impairments: Multiple swallows   Solid     Solid: Not tested      [name] 07/01/2020,11:34 AM   [name], MS [facility] SLP Acute Rehab Services Office [phone] Pager  [phone]

## 2020-07-02 ENCOUNTER — Encounter (HOSPITAL_BASED_OUTPATIENT_CLINIC_OR_DEPARTMENT_OTHER): Payer: Medicare Other | Admitting: Internal Medicine

## 2020-07-02 ENCOUNTER — Inpatient Hospital Stay (HOSPITAL_COMMUNITY): Payer: Medicare Other

## 2020-07-02 DIAGNOSIS — L89304 Pressure ulcer of unspecified buttock, stage 4: Secondary | ICD-10-CM | POA: Diagnosis not present

## 2020-07-02 LAB — BASIC METABOLIC PANEL
Anion gap: 9 (ref 5–15)
BUN: 15 mg/dL (ref 6–20)
CO2: 24 mmol/L (ref 22–32)
Calcium: 7.6 mg/dL — ABNORMAL LOW (ref 8.9–10.3)
Chloride: 103 mmol/L (ref 98–111)
Creatinine, Ser: 0.43 mg/dL — ABNORMAL LOW (ref 0.61–1.24)
GFR, Estimated: >60 mL/min (ref >60)
Glucose, Bld: 105 mg/dL — ABNORMAL HIGH (ref 70–99)
Potassium: 3.4 mmol/L — ABNORMAL LOW (ref 3.5–5.1)
Sodium: 136 mmol/L (ref 135–145)

## 2020-07-02 LAB — CBC
HCT: 33.5 % — ABNORMAL LOW (ref 39.0–52.0)
Hemoglobin: 10 g/dL — ABNORMAL LOW (ref 13.0–17.0)
MCH: 30.8 pg (ref 26.0–34.0)
MCHC: 29.9 g/dL — ABNORMAL LOW (ref 30.0–36.0)
MCV: 103.1 fL — ABNORMAL HIGH (ref 80.0–100.0)
Platelets: 253 10*3/uL (ref 150–400)
RBC: 3.25 MIL/uL — ABNORMAL LOW (ref 4.22–5.81)
RDW: 17.4 % — ABNORMAL HIGH (ref 11.5–15.5)
WBC: 4.1 10*3/uL (ref 4.0–10.5)
nRBC: 0 % (ref 0.0–0.2)

## 2020-07-02 LAB — PROCALCITONIN: Procalcitonin: 0.35 ng/mL

## 2020-07-02 MED ORDER — MIDODRINE HCL 5 MG PO TABS
10.0000 mg | ORAL_TABLET | Freq: Three times a day (TID) | ORAL | Status: DC
  Administered 2020-07-02 – 2020-07-03 (×5): 10 mg via ORAL
  Filled 2020-07-02 (×5): qty 2

## 2020-07-02 MED ORDER — SODIUM CHLORIDE 0.9 % IV BOLUS
1000.0000 mL | Freq: Once | INTRAVENOUS | Status: AC
  Administered 2020-07-02: 1000 mL via INTRAVENOUS

## 2020-07-02 MED ORDER — POTASSIUM CHLORIDE IN NACL 20-0.9 MEQ/L-% IV SOLN
INTRAVENOUS | Status: AC
  Filled 2020-07-02 (×5): qty 1000

## 2020-07-02 NOTE — Progress Notes (Signed)
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 Patient was followed by infectious disease clinic and supposed to have a PICC line placement and 6 weeks of IV antibiotics. Currently on oral antibiotics with cefdinir and Flagyl. In the emergency room, temperature 99.7. Heart rate 102. Blood pressures fairly normal. 89% on room air. Lactic acid 1.5. CT scan of the chest abdomen pelvis showed right lower lobe consolidation with mucoid material on the right lower lobe bronchus. Decubitus ulceration, erosive changes of the sacrum. Patient was treated as sepsis and admitted to the hospital.  In the evening of 06/30/20 patient was transferred to stepdown unit due to severe hypotension, received IV fluid boluses, vasopressor was ordered, not started, currently off pressors.  07/02/20: Seen and examined with his sister at his bedside.  Somnolent and hypotensive.  1 L bolus normal saline ordered.  Started midodrine 10 mg 3 times daily.  Assessment/Plan: Active Problems:   Pressure injury of skin   Sepsis (HCC)  Sepsis, improving, secondary to right lower lobe pneumonia with hypoxia:  -Suspect aspiration with both gram-positive and gram-negative bacteremia. -Pt had been on cefdinir and Flagyl as outpatient.  -Blood cultures were drawn and initially treated with cefepime and vancomycin - -seen by SLP, s/p modified barium swallow Started on dysphagia 1 thin liquid diet for moderate aspiration risk. Aspiration precautions Feeding assistance Procalcitonin has normalized 0.35 on  07/02/2020 Personally reviewed chest x-ray done on 07/02/2020 no noted lobular infiltrates possible atelectasis at bases versus infiltrates. Afebrile with no leukocytosis  Chronic decubitus ulcer/sacral osteomyelitis: Present on admission. Stage IV sacral decubitus. -Blood cx confirmed neg -PICC placed by IR for long-term abx, placed on 11/4 -Currently on cefepime and IV vancomycin, continue.  Persistent hypotension Received IV fluid bolus normal saline 1 L on 07/02/2020 Resume IV fluid normal saline KCl 20 mEq at 100 cc an hour on 07/02/2020 Started midodrine 10 mg 3 times daily on 07/02/2020 Maintain MAP greater than 65  Dysphagia 1 Seen by speech therapist Management per speech therapist recommendation Aspiration precautions.  Moderate protein calorie malnutrition Continue to encourage increase in oral protein calorie intake Continue oral supplement  Hyperlipidemia:  -Continue statin as tolerated  Seizure disorder:  -Remains stable on Keppra.  Down syndrome/bedbound status/contractures and incontinence:  -Lives at home with family. -Plans noted for SNF placement. TOC following  Resolved hypernatremia:  -Due to free water deficit. Treated with isotonic fluid, normalized  Hypokalemia: -K this AM 3.4, repleted intravenously Magnesium level 2.0 on 07/01/2020.  Transient Vtach -Continue to monitor on telemetry Maximize potassium and magnesium level Off Coreg due to hypotension.  DVT prophylaxis: Heparin subq 3 times daily. Code Status: Full Family Communication:  Updated sister at bedside.  Status is: Inpatient  Remains inpatient appropriate because:Unsafe d/c plan   Dispo: The patient is from: Home  Anticipated d/c is to: SNF  Anticipated d/c date is: 2 days  Patient currently is medically stable to d/c. Just pending disposition  Consultants:   ID  IR  Procedures:   PICC placed under anesthesia 11/4  by IR    Status is: Inpatient   Dispo: The patient is from: Home.  Anticipated d/c is to: SNF.              Anticipated d/c date is: 07/03/2020.               Patient currently not appropriate for discharge due to ongoing treatment for sacral osteomyelitis.         Objective: Vitals:   07/02/20 0700 07/02/20 0731 07/02/20 0900 07/02/20 1217  BP: (!) 107/35  (!) 90/45   Pulse: 85  84   Resp: 19  13   Temp:  98.2 F (36.8 C)  99.1 F (37.3 C)  TempSrc:  Oral  Axillary  SpO2: 99%  100%   Weight:      Height:        Intake/Output Summary (Last 24 hours) at 07/02/2020 1732 Last data filed at 07/02/2020 1531 Gross per 24 hour  Intake 1392.06 ml  Output 1650 ml  Net -257.94 ml   Filed Weights   06/22/20 1713 07/01/20 0000  Weight: 40.8 kg (P) 45.4 kg    Exam:  . General: 54 y.o. year-old male chronically ill-appearing somnolent.   . Cardiovascular: Regular rate and rhythm no rubs or gallops.  Respiratory: Clear to auscultation no wheezes or rales. . Abdomen: Soft nontender normal bowel sounds present.  . Musculoskeletal: No lower extremity edema bilaterally.  Marland Kitchen. Psychiatry: Mood is appropriate for condition and setting.  Data Reviewed: CBC: Recent Labs  Lab 07/01/20 0520 07/02/20 0510  WBC 3.3* 4.1  HGB 9.9* 10.0*  HCT 32.9* 33.5*  MCV 103.1* 103.1*  PLT 236 253   Basic Metabolic Panel: Recent Labs  Lab 06/26/20 0553 06/26/20 0553 06/27/20 0400 06/27/20 0400 06/28/20 0500 06/29/20 0519 06/29/20 0810 06/30/20 0640 07/01/20 0520 07/02/20 0510  NA 137   < > 138   < > 138 136  --  136 135 136  K 3.3*   < > 4.3   < > 3.9 3.9  --  3.5 3.8 3.4*  CL 98   < > 101   < > 100 100  --  100 105 103  CO2 26   < > 28   < > 27 30  --  30 27 24   GLUCOSE 106*   < > 101*   < > 109* 116*  --  109* 114* 105*  BUN 10   < > 12   < > 11 16  --  15 16 15   CREATININE 0.53*   < > 0.68   < > 0.48* 0.52*  --  0.55* 0.39* 0.43*  CALCIUM 7.5*   < > 8.1*   < >  7.8* 7.5*  --  7.7* 7.5* 7.6*  MG 2.0  --  2.0  --   --   --  2.0 2.0 2.0  --    < > = values in this interval not displayed.   GFR: Estimated Creatinine Clearance: 60.2 mL/min (A) (by C-G formula based on SCr of 0.43 mg/dL (L)). Liver Function Tests: Recent Labs  Lab 06/27/20 0400 06/28/20 0500 06/29/20 0519 06/30/20 0640 07/01/20 0520  AST 20 17 17 27 28   ALT 10 7 6 8 7   ALKPHOS 50 53 53 52 47  BILITOT 0.4 0.4 0.2* 0.4 0.3  PROT 5.5* 5.7* 5.4* 5.2* 5.2*  ALBUMIN 2.3* 2.4* 2.1* 2.0* 2.4*   No results for input(s): LIPASE, AMYLASE in the last 168 hours. No results for input(s): AMMONIA in the last 168 hours. Coagulation Profile: No results for input(s): INR,  PROTIME in the last 168 hours. Cardiac Enzymes: No results for input(s): CKTOTAL, CKMB, CKMBINDEX, TROPONINI in the last 168 hours. BNP (last 3 results) No results for input(s): PROBNP in the last 8760 hours. HbA1C: No results for input(s): HGBA1C in the last 72 hours. CBG: No results for input(s): GLUCAP in the last 168 hours. Lipid Profile: No results for input(s): CHOL, HDL, LDLCALC, TRIG, CHOLHDL, LDLDIRECT in the last 72 hours. Thyroid Function Tests: No results for input(s): TSH, T4TOTAL, FREET4, T3FREE, THYROIDAB in the last 72 hours. Anemia Panel: No results for input(s): VITAMINB12, FOLATE, FERRITIN, TIBC, IRON, RETICCTPCT in the last 72 hours. Urine analysis:    Component Value Date/Time   COLORURINE YELLOW 06/22/2020 2224   APPEARANCEUR CLEAR 06/22/2020 2224   LABSPEC 1.035 (H) 06/22/2020 2224   PHURINE 5.0 06/22/2020 2224   GLUCOSEU NEGATIVE 06/22/2020 2224   HGBUR NEGATIVE 06/22/2020 2224   BILIRUBINUR NEGATIVE 06/22/2020 2224   KETONESUR NEGATIVE 06/22/2020 2224   PROTEINUR NEGATIVE 06/22/2020 2224   NITRITE NEGATIVE 06/22/2020 2224   LEUKOCYTESUR TRACE (A) 06/22/2020 2224   Sepsis Labs: @LABRCNTIP (procalcitonin:4,lacticidven:4)  ) Recent Results (from the past 240 hour(s))  Urine culture      Status: Abnormal   Collection Time: 06/22/20 10:24 PM   Specimen: In/Out Cath Urine  Result Value Ref Range Status   Specimen Description   Final    IN/OUT CATH URINE Performed at [facility], [address], [place], [place]    Special Requests   Final    NONE Performed at [facility], [address] [place], [place]    Culture MULTIPLE SPECIES PRESENT, SUGGEST RECOLLECTION (A)  Final   Report Status 06/23/2020 FINAL  Final  MRSA PCR Screening     Status: None   Collection Time: 06/23/20 10:46 AM   Specimen: Nasal Mucosa; Nasopharyngeal  Result Value Ref Range Status   MRSA by PCR NEGATIVE NEGATIVE Final    Comment:        The GeneXpert MRSA Assay (FDA approved for NASAL specimens only), is one component of a comprehensive MRSA colonization surveillance program. It is not intended to diagnose MRSA infection nor to guide or monitor treatment for MRSA infections. Performed at [facility], [address] [place], [place]   Culture, blood (routine x 2)     Status: None (Preliminary result)   Collection Time: 06/30/20  6:02 PM   Specimen: BLOOD LEFT HAND  Result Value Ref Range Status   Specimen Description   Final    BLOOD LEFT HAND Performed at [facility], [address] [place], [place]    Special Requests   Final    BOTTLES DRAWN AEROBIC ONLY Blood Culture results may not be optimal due to an inadequate volume of blood received in culture bottles Performed at [facility], [address] [place], [place]    Culture   Final    NO GROWTH 2 DAYS Performed at [facility], [address] [place], [place]    Report Status PENDING  Incomplete  Culture, blood (routine x 2)     Status: None (Preliminary result)   Collection Time: 06/30/20  6:02 PM   Specimen: BLOOD LEFT FOREARM  Result Value Ref Range  Status   Specimen Description   Final    BLOOD LEFT FOREARM Performed at [facility], [address], [place], [place]  Special Requests   Final    BOTTLES DRAWN AEROBIC ONLY Blood Culture adequate volume Performed at [facility], [address], [place], [place] [address]    Culture   Final    NO GROWTH 2 DAYS Performed at [facility], [address], [place], [place] [address]    Report Status PENDING  Incomplete      Studies: DG CHEST PORT 1 VIEW  Result Date: 07/02/2020 CLINICAL DATA:  Aspiration pneumonia EXAM: PORTABLE CHEST 1 VIEW COMPARISON:  Two days ago FINDINGS: Indistinct opacity at the lung bases. No visible effusion or pneumothorax. Normal heart size. Left PICC with tip at the upper right atrium. IMPRESSION: Stable infiltrates at the lung bases. Electronically Signed   By: Marnee Spring M.D.   On: 07/02/2020 05:27    Scheduled Meds: . aspirin EC  81 mg Oral Daily  . Chlorhexidine Gluconate Cloth  6 each Topical Daily  . feeding supplement  237 mL Oral BID BM  . feeding supplement (PRO-STAT SUGAR FREE 64)  30 mL Oral BID  . finasteride  5 mg Oral Daily  . heparin  5,000 Units Subcutaneous Q8H  . iron polysaccharides  150 mg Oral Daily  . loratadine  10 mg Oral Daily  . mouth rinse  15 mL Mouth Rinse BID  . midodrine  10 mg Oral TID WC  . nutrition supplement (JUVEN)  1 packet Oral BID BM  . pravastatin  40 mg Oral Daily  . selenium sulfide   Topical Daily  . vitamin B-12  500 mcg Oral Daily  . zinc sulfate  220 mg Oral Daily    Continuous Infusions: . 0.9 % NaCl with KCl 20 mEq / L Stopped (07/02/20 1530)  . ceFEPime (MAXIPIME) IV 200 mL/hr at 07/02/20 1531  . levETIRAcetam Stopped (07/02/20 1147)  . vancomycin Stopped (07/02/20 0107)     LOS: 10 days     Darlin Drop, MD [facility] Pager [phone]  If 7PM-7AM, please contact night-coverage [email or web address] Password  [facility] 07/02/2020, 5:32 PM

## 2020-07-02 NOTE — TOC Progression Note (Signed)
Transition of Care [facility]) - Progression Note    Patient Details  Name: Tracy Todd MRN: [id number] Date of Birth: Feb 26, 1966  Transition of Care [facility]) CM/SW Contact  Golda Acre, RN Phone Number: 07/02/2020, 8:16 AM  Clinical Narrative:    07/01/20: Seen and examined with his sister at his bedside.  He is alert but minimally interactive.  Maintaining a map above 65 in the room without vasopressor. Plan: long term snf Accepted : [facility] [address] [facility] Will let legal guardian be aware of these facilites if patient survives.  Expected Discharge Plan: Skilled Nursing Facility Barriers to Discharge: Awaiting State Approval [facility])  Expected Discharge Plan and Services Expected Discharge Plan: Skilled Nursing Facility       Living arrangements for the past 2 months: Single Family Home                                       Social Determinants of Health (SDOH) Interventions    Readmission Risk Interventions No flowsheet data found.

## 2020-07-02 NOTE — Progress Notes (Signed)
SLP Cancellation Note  Patient Details Name: Tracy Todd MRN: [id number] DOB: 11-06-1965   Cancelled treatment:       Reason Eval/Treat Not Completed: Other (comment) (pt has remained lethargic, will continue efforts and recommend only feed pt if fully alert)  [name], MS [facility] SLP Acute Rehab Services Office [phone] Pager [phone]   [name] 07/02/2020, 8:08 PM

## 2020-07-03 DIAGNOSIS — L89304 Pressure ulcer of unspecified buttock, stage 4: Secondary | ICD-10-CM | POA: Diagnosis not present

## 2020-07-03 LAB — BASIC METABOLIC PANEL
Anion gap: 6 (ref 5–15)
BUN: 9 mg/dL (ref 6–20)
CO2: 23 mmol/L (ref 22–32)
Calcium: 7 mg/dL — ABNORMAL LOW (ref 8.9–10.3)
Chloride: 112 mmol/L — ABNORMAL HIGH (ref 98–111)
Creatinine, Ser: 0.41 mg/dL — ABNORMAL LOW (ref 0.61–1.24)
GFR, Estimated: >60 mL/min (ref >60)
Glucose, Bld: 97 mg/dL (ref 70–99)
Potassium: 4 mmol/L (ref 3.5–5.1)
Sodium: 141 mmol/L (ref 135–145)

## 2020-07-03 LAB — FOLATE: Folate: 12.1 ng/mL (ref >5.9)

## 2020-07-03 LAB — TSH: TSH: 7.729 u[IU]/mL — ABNORMAL HIGH (ref 0.350–4.500)

## 2020-07-03 LAB — PHOSPHORUS: Phosphorus: 2.9 mg/dL (ref 2.5–4.6)

## 2020-07-03 LAB — PROCALCITONIN: Procalcitonin: 0.2 ng/mL

## 2020-07-03 LAB — VITAMIN B12: Vitamin B-12: 790 pg/mL (ref 180–914)

## 2020-07-03 LAB — MAGNESIUM: Magnesium: 1.7 mg/dL (ref 1.7–2.4)

## 2020-07-03 MED ORDER — SODIUM CHLORIDE 3 % IN NEBU
4.0000 mL | INHALATION_SOLUTION | Freq: Three times a day (TID) | RESPIRATORY_TRACT | Status: AC
  Administered 2020-07-03 – 2020-07-06 (×6): 4 mL via RESPIRATORY_TRACT
  Filled 2020-07-03 (×11): qty 4

## 2020-07-03 MED ORDER — GUAIFENESIN 100 MG/5ML PO SOLN
10.0000 mL | Freq: Three times a day (TID) | ORAL | Status: AC
  Administered 2020-07-03 – 2020-07-06 (×9): 200 mg via ORAL
  Filled 2020-07-03: qty 10
  Filled 2020-07-03: qty 20
  Filled 2020-07-03 (×5): qty 10
  Filled 2020-07-03: qty 20
  Filled 2020-07-03: qty 10

## 2020-07-03 MED ORDER — CEFAZOLIN SODIUM-DEXTROSE 2-4 GM/100ML-% IV SOLN
2.0000 g | Freq: Three times a day (TID) | INTRAVENOUS | Status: DC
  Administered 2020-07-03 – 2020-07-20 (×49): 2 g via INTRAVENOUS
  Filled 2020-07-03 (×55): qty 100

## 2020-07-03 MED ORDER — SODIUM CHLORIDE 0.9 % IV BOLUS
500.0000 mL | Freq: Once | INTRAVENOUS | Status: AC
  Administered 2020-07-03: 500 mL via INTRAVENOUS

## 2020-07-03 MED ORDER — MAGNESIUM OXIDE 400 (241.3 MG) MG PO TABS
800.0000 mg | ORAL_TABLET | Freq: Every day | ORAL | Status: AC
  Administered 2020-07-03 – 2020-07-05 (×3): 800 mg via ORAL
  Filled 2020-07-03 (×4): qty 2

## 2020-07-03 MED ORDER — GUAIFENESIN ER 600 MG PO TB12
1200.0000 mg | ORAL_TABLET | Freq: Two times a day (BID) | ORAL | Status: DC
  Filled 2020-07-03: qty 2

## 2020-07-03 MED ORDER — ALBUTEROL SULFATE (2.5 MG/3ML) 0.083% IN NEBU
2.5000 mg | INHALATION_SOLUTION | RESPIRATORY_TRACT | Status: DC | PRN
  Administered 2020-07-03 – 2020-07-06 (×2): 2.5 mg via RESPIRATORY_TRACT
  Filled 2020-07-03 (×2): qty 3

## 2020-07-03 MED ORDER — METRONIDAZOLE 500 MG PO TABS
500.0000 mg | ORAL_TABLET | Freq: Three times a day (TID) | ORAL | Status: DC
  Administered 2020-07-03 – 2020-07-20 (×50): 500 mg via ORAL
  Filled 2020-07-03 (×52): qty 1

## 2020-07-03 MED ORDER — CALCIUM GLUCONATE-NACL 2-0.675 GM/100ML-% IV SOLN
2.0000 g | Freq: Once | INTRAVENOUS | Status: AC
  Administered 2020-07-03: 2000 mg via INTRAVENOUS
  Filled 2020-07-03: qty 100

## 2020-07-03 NOTE — Consult Note (Signed)
[facility]       Reason for Consult: osteomyelitis    Referring Physician: Dr. Margo Aye  Active Problems:   Pressure injury of skin   Sepsis (HCC)   . aspirin EC  81 mg Oral Daily  . Chlorhexidine Gluconate Cloth  6 each Topical Daily  . feeding supplement  237 mL Oral BID BM  . feeding supplement (PRO-STAT SUGAR FREE 64)  30 mL Oral BID  . finasteride  5 mg Oral Daily  . guaiFENesin  1,200 mg Oral BID  . heparin  5,000 Units Subcutaneous Q8H  . iron polysaccharides  150 mg Oral Daily  . loratadine  10 mg Oral Daily  . magnesium oxide  800 mg Oral Daily  . mouth rinse  15 mL Mouth Rinse BID  . midodrine  10 mg Oral TID WC  . nutrition supplement (JUVEN)  1 packet Oral BID BM  . pravastatin  40 mg Oral Daily  . selenium sulfide   Topical Daily  . sodium chloride HYPERTONIC  4 mL Nebulization TID  . vitamin B-12  500 mcg Oral Daily  . zinc sulfate  220 mg Oral Daily    Recommendations: Transition to home antibiotics - cefazolin and metronidazole (national shortage of IV formulation so using po) Stop cefepime and vancomycin  Assessment: He has pneumonia now likely from aspiration.  He has follow up arranged with Dr. Elinor Parkinson  Thanks for consultation  Antibiotics: Cefepime day 4  HPI: Tracy Todd is a 54 y.o. male with a history of Down's syndrome and is bedbound, non-verbal at baseline presented 11/1 with concern for fever and cough.  Started on treatment with cefazolin and flagyl and remained afebrile until 11/9.  He developed a fever x 1 to 102 with sob and broadened to cefepime with Flagyl.  Has been afebrile since then.  He has had progressive decline and contractures.  Sister at bedside.  At home they are helping him offload.   picc placed on 11/4 for his chronic osteomyelitis.  Plan for 6 weeks through December 15th (not counting oral therapy already received).    Review of Systems:  Unable to be assessed due to mental status  Past  Medical History:  Diagnosis Date  . Arthritis   . Down's syndrome   . Headache   . Hyperlipidemia   . Iron deficiency anemia   . Recurrent syncope   . Seizure disorder (HCC) 05/21/2020  . Seizures (HCC)     Social History   Tobacco Use  . Smoking status: Never Smoker  . Smokeless tobacco: Never Used  Vaping Use  . Vaping Use: Never used  Substance Use Topics  . Alcohol use: Never  . Drug use: Never    History reviewed. No pertinent family history.  Allergies  Allergen Reactions  . Penicillins     Unknown Did it involve swelling of the face/tongue/throat, SOB, or low BP? Unk Did it involve sudden or severe rash/hives, skin peeling, or any reaction on the inside of your mouth or nose? Unk Did you need to seek medical attention at a hospital or doctor's office? Unk When did it last happen?childhood If all above answers are "NO", may proceed with cephalosporin use.      Physical Exam: Constitutional: in no apparent distress  Vitals:   07/03/20 1230 07/03/20 1300  BP:  (!) 128/41  Pulse: 63 (!) 58  Resp: 15 19  Temp:    SpO2: 100% 99%   EYES:  anicteric Cardiovascular: Cor RRR Respiratory: clear; Musculoskeletal: No edema Skin: negatives: no rash  Lab Results  Component Value Date   WBC 4.1 07/02/2020   HGB 10.0 (L) 07/02/2020   HCT 33.5 (L) 07/02/2020   MCV 103.1 (H) 07/02/2020   PLT 253 07/02/2020    Lab Results  Component Value Date   CREATININE 0.41 (L) 07/03/2020   BUN 9 07/03/2020   NA 141 07/03/2020   K 4.0 07/03/2020   CL 112 (H) 07/03/2020   CO2 23 07/03/2020    Lab Results  Component Value Date   ALT 7 07/01/2020   AST 28 07/01/2020   ALKPHOS 47 07/01/2020     Microbiology: Recent Results (from the past 240 hour(s))  Culture, blood (routine x 2)     Status: None (Preliminary result)   Collection Time: 06/30/20  6:02 PM   Specimen: BLOOD LEFT HAND  Result Value Ref Range Status   Specimen Description   Final    BLOOD LEFT  HAND Performed at [facility], [address] [place], [place] [address]    Special Requests   Final    BOTTLES DRAWN AEROBIC ONLY Blood Culture results may not be optimal due to an inadequate volume of blood received in culture bottles Performed at [facility], [address], [place], [place] [address]    Culture   Final    NO GROWTH 3 DAYS Performed at [facility], [address], [place], [place] [address]    Report Status PENDING  Incomplete  Culture, blood (routine x 2)     Status: None (Preliminary result)   Collection Time: 06/30/20  6:02 PM   Specimen: BLOOD LEFT FOREARM  Result Value Ref Range Status   Specimen Description   Final    BLOOD LEFT FOREARM Performed at [facility], [address], [place], [place] [address]    Special Requests   Final    BOTTLES DRAWN AEROBIC ONLY Blood Culture adequate volume Performed at [facility], [address] [place], [place] [address]    Culture   Final    NO GROWTH 3 DAYS Performed at [facility], [address] [place], [place] [address]    Report Status PENDING  Incomplete    Gardiner Barefoot, MD [facility] [email or web address] 07/03/2020, 2:18 PM

## 2020-07-03 NOTE — Progress Notes (Addendum)
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  Noticed to be breathing faster and coughing. Patient was followed by infectious disease clinic with plan for 6 weeks of IV antibiotics via PICC line.  On presentation he was on oral antibiotics cefdinir and p.o. Flagyl. CT scan of the chest abdomen pelvis showed right lower lobe consolidation with mucoid material on the right lower lobe bronchus. Decubitus ulceration, erosive changes of the sacrum. Patient was treated as sepsis, likely multifactorial, and admitted to the hospital.  In the evening of 06/30/20 patient was transferred to stepdown unit due to severe hypotension, received IV fluid boluses.  Due to persistent hypotension he was started on midodrine 10 mg 3 times daily on 07/02/2020.  07/03/20: Seen and examined with his sister at his bedside.  He is more arousable today.  She reports that he ate a small breakfast.  His blood pressure is stable on the monitor maintaining MAP greater than 65 on midodrine.  Assessment/Plan: Active Problems:   Pressure injury of skin   Sepsis ([place])  Sepsis, likely multifactorial, improving, secondary to right lower lobe pneumonia with hypoxia, sacral and left trochanteric osteomyelitis  -Suspect aspiration with both gram-positive and gram-negative bacteremia. -Pt had been on cefdinir and Flagyl as outpatient.  -Blood cultures were drawn and are negative to date. Was initially started and continued on cefepime and IV vancomycin -seen by SLP, s/p modified barium swallow Currently on dysphagia 1 thin liquid diet for moderate aspiration  risk. Continue aspiration precautions and feeding assistance. Procalcitonin level has normalized. Chest x-ray done on 07/02/2020 no evidence of lobular infiltrates. Currently afebrile with no leukocytosis.  Acute hypoxic respiratory failure secondary to right lower lobe pneumonia, atelectasis Not on O2 supplementation at baseline Currently on 2 L with O2 saturation 98%. Treated for pneumonia Start incentive spirometer for atelectasis  Chest congestion with productive cough Started pulmonary toilet Mucinex 1200 mg twice daily x3 days Hypersaline nebs 3 times daily x3 days Chest PT 3 times daily x3 days  Hypocalcemia from poor dietary intake Corrected ca2+ for albumin 8.3 Ordered 2 g IV ca2+ gluconate once Repeat BMP in the AM  Chronic decubitus ulcers/sacral and left trochanteric osteomyelitis: Present on admission.  -Blood cx confirmed neg -PICC placed by IR for long-term abx, placed on 11/4 -Currently on cefepime and IV vancomycin -ESR and CRP ordered for AM labs -Infectious disease consulted  Persistent hypotension, resolved on midodrine. Received IV fluid bolus normal saline 1 L on 07/02/2020 Resume IV fluid normal saline KCl 20 mEq at 100 cc an hour on 07/02/2020 Started midodrine 10 mg 3 times daily on 07/02/2020 Maintain MAP greater than 65  Dysphagia 1 Seen by speech therapist Management per speech therapist recommendation Aspiration precautions.  Moderate protein calorie malnutrition Continue to encourage increase in oral protein calorie intake Continue oral supplement  Hyperlipidemia:  -Continue statin as tolerated  Seizure disorder:  -Remains stable on  IV Keppra. Switch IV Keppra to oral Keppra once can tolerate oral intake adequately  Down syndrome/bedbound status/contractures and incontinence:  -Lives at home with family. -Plans noted for SNF placement. TOC following  Resolved hypernatremia:  -Due to free water deficit. Treated with isotonic  fluid, normalized  Resolved post repletion: Hypokalemia: Serum potassium 4.0 on 07/03/2020. Magnesium 1.7, repleted orally with magnesium oxide 800 mg daily.  Transient Vtach -Continue to monitor on telemetry Maximize potassium and magnesium level Off home Coreg due to hypotension.  DVT prophylaxis: Heparin subq 3 times daily. Code Status: Full Family Communication:  Updated sister at bedside on 07/03/2020.    Consultants:   ID  IR  Procedures:   PICC placed under anesthesia 11/4 by IR    Status is: Inpatient   Dispo: The patient is from: Home.              Anticipated d/c is to: SNF.              Anticipated d/c date is: 07/06/2020.               Patient currently not appropriate for discharge due to ongoing treatment for osteomyelitis.         Objective: Vitals:   07/03/20 0900 07/03/20 0930 07/03/20 1000 07/03/20 1030  BP: (!) 119/33  103/71   Pulse: (!) 48 63 65 (!) 44  Resp: 11 17 13 15   Temp:      TempSrc:      SpO2: 100% 100% 100% 99%  Weight:      Height:        Intake/Output Summary (Last 24 hours) at 07/03/2020 1122 Last data filed at 07/03/2020 0700 Gross per 24 hour  Intake 2719.47 ml  Output 1775 ml  Net 944.47 ml   Filed Weights   06/22/20 1713 07/01/20 0000  Weight: 40.8 kg (P) 45.4 kg    Exam:  . General: 54 y.o. year-old male chronically ill-appearing, somnolent but easily arousable to voices.  No acute distress.  . Cardiovascular: Regular rate and rhythm no rubs or gallops.  Marland Kitchen Respiratory: Mild rales at bases no wheezing noted. . Abdomen: Soft nontender normal bowel sounds present. . Musculoskeletal: No lower extremity edema bilaterally. Marland Kitchen Psychiatry: Mood is appropriate for condition and setting.  Data Reviewed: CBC: Recent Labs  Lab 07/01/20 0520 07/02/20 0510  WBC 3.3* 4.1  HGB 9.9* 10.0*  HCT 32.9* 33.5*  MCV 103.1* 103.1*  PLT 236 106   Basic Metabolic Panel: Recent Labs  Lab 06/27/20 0400  06/28/20 0500 06/29/20 0519 06/29/20 0810 06/30/20 0640 07/01/20 0520 07/02/20 0510 07/03/20 0555  NA 138   < > 136  --  136 135 136 141  K 4.3   < > 3.9  --  3.5 3.8 3.4* 4.0  CL 101   < > 100  --  100 105 103 112*  CO2 28   < > 30  --  30 27 24 23   GLUCOSE 101*   < > 116*  --  109* 114* 105* 97  BUN 12   < > 16  --  15 16 15 9   CREATININE 0.68   < > 0.52*  --  0.55* 0.39* 0.43* 0.41*  CALCIUM 8.1*   < > 7.5*  --  7.7* 7.5* 7.6* 7.0*  MG 2.0  --   --  2.0 2.0 2.0  --  1.7  PHOS  --   --   --   --   --   --   --  2.9   < > = values in this interval not displayed.   GFR: Estimated Creatinine Clearance: 60.2 mL/min (A) (by C-G formula based on SCr of 0.41 mg/dL (L)). Liver Function Tests: Recent Labs  Lab 06/27/20 0400  06/28/20 0500 06/29/20 0519 06/30/20 0640 07/01/20 0520  AST 20 17 17 27 28   ALT 10 7 6 8 7   ALKPHOS 50 53 53 52 47  BILITOT 0.4 0.4 0.2* 0.4 0.3  PROT 5.5* 5.7* 5.4* 5.2* 5.2*  ALBUMIN 2.3* 2.4* 2.1* 2.0* 2.4*   No results for input(s): LIPASE, AMYLASE in the last 168 hours. No results for input(s): AMMONIA in the last 168 hours. Coagulation Profile: No results for input(s): INR, PROTIME in the last 168 hours. Cardiac Enzymes: No results for input(s): CKTOTAL, CKMB, CKMBINDEX, TROPONINI in the last 168 hours. BNP (last 3 results) No results for input(s): PROBNP in the last 8760 hours. HbA1C: No results for input(s): HGBA1C in the last 72 hours. CBG: No results for input(s): GLUCAP in the last 168 hours. Lipid Profile: No results for input(s): CHOL, HDL, LDLCALC, TRIG, CHOLHDL, LDLDIRECT in the last 72 hours. Thyroid Function Tests: No results for input(s): TSH, T4TOTAL, FREET4, T3FREE, THYROIDAB in the last 72 hours. Anemia Panel: No results for input(s): VITAMINB12, FOLATE, FERRITIN, TIBC, IRON, RETICCTPCT in the last 72 hours. Urine analysis:    Component Value Date/Time   COLORURINE YELLOW 06/22/2020 [place] 06/22/2020 2224    LABSPEC 1.035 (H) 06/22/2020 2224   PHURINE 5.0 06/22/2020 2224   GLUCOSEU NEGATIVE 06/22/2020 2224   HGBUR NEGATIVE 06/22/2020 2224   BILIRUBINUR NEGATIVE 06/22/2020 2224   KETONESUR NEGATIVE 06/22/2020 2224   PROTEINUR NEGATIVE 06/22/2020 2224   NITRITE NEGATIVE 06/22/2020 2224   LEUKOCYTESUR TRACE (A) 06/22/2020 2224   Sepsis Labs: @LABRCNTIP (procalcitonin:4,lacticidven:4)  ) Recent Results (from the past 240 hour(s))  Culture, blood (routine x 2)     Status: None (Preliminary result)   Collection Time: 06/30/20  6:02 PM   Specimen: BLOOD LEFT HAND  Result Value Ref Range Status   Specimen Description   Final    BLOOD LEFT HAND Performed at [facility], [place] [address] [place], [place] [address]    Special Requests   Final    BOTTLES DRAWN AEROBIC ONLY Blood Culture results may not be optimal due to an inadequate volume of blood received in culture bottles Performed at [place] [address], [place], [place] [address]    Culture   Final    NO GROWTH 3 DAYS Performed at [facility], [place] [address], [place], [place] [address]    Report Status PENDING  Incomplete  Culture, blood (routine x 2)     Status: None (Preliminary result)   Collection Time: 06/30/20  6:02 PM   Specimen: BLOOD LEFT FOREARM  Result Value Ref Range Status   Specimen Description   Final    BLOOD LEFT FOREARM Performed at [place] [address], [place], [place] [address]    Special Requests   Final    BOTTLES DRAWN AEROBIC ONLY Blood Culture adequate volume Performed at [place] [address] [place], [place] [address]    Culture   Final    NO GROWTH 3 DAYS Performed at [facility], [place] [address], [place], [place] [address]    Report Status PENDING  Incomplete      Studies: No results found.  Scheduled Meds: . aspirin EC  81 mg Oral Daily  . Chlorhexidine Gluconate Cloth  6  each Topical Daily  . feeding supplement  237 mL Oral BID BM  . feeding supplement (PRO-STAT SUGAR FREE 64)  30 mL Oral  BID  . finasteride  5 mg Oral Daily  . guaiFENesin  1,200 mg Oral BID  . heparin  5,000 Units Subcutaneous Q8H  . iron polysaccharides  150 mg Oral Daily  . loratadine  10 mg Oral Daily  . magnesium oxide  800 mg Oral Daily  . mouth rinse  15 mL Mouth Rinse BID  . midodrine  10 mg Oral TID WC  . nutrition supplement (JUVEN)  1 packet Oral BID BM  . pravastatin  40 mg Oral Daily  . selenium sulfide   Topical Daily  . sodium chloride HYPERTONIC  4 mL Nebulization TID  . vitamin B-12  500 mcg Oral Daily  . zinc sulfate  220 mg Oral Daily    Continuous Infusions: . 0.9 % NaCl with KCl 20 mEq / L 100 mL/hr at 07/03/20 0700  . calcium gluconate    . ceFEPime (MAXIPIME) IV Stopped (07/03/20 0251)  . levETIRAcetam 500 mg (07/03/20 0945)  . vancomycin Stopped (07/03/20 0145)     LOS: 11 days     Kayleen Memos, MD [facility] Pager [phone]  If 7PM-7AM, please contact night-coverage [email or web address] Password [facility] 07/03/2020, 11:22 AM

## 2020-07-03 NOTE — TOC Progression Note (Addendum)
Transition of Care [facility]) - Progression Note    Patient Details  Name: Tracy Todd MRN: [id number] Date of Birth: [birth year]  [facility] CM/SW Contact  Golda Acre, RN Phone Number: 07/03/2020, 8:25 AM  Clinical Narrative:    tct-Sister Tracy Todd/ gave the information of which snf's have accepted patient.  [facility], [facility] , [facility], [facility]. Wants brother to be close to home as possible will look into [facility] and [facility]. rec'd passar number [phone] F will add to fl2.   Expected Discharge Plan: Skilled Nursing Facility Barriers to Discharge: Awaiting State Approval [facility])  Expected Discharge Plan and Services Expected Discharge Plan: Skilled Nursing Facility       Living arrangements for the past 2 months: Single Family Home                                       Social Determinants of Health (SDOH) Interventions    Readmission Risk Interventions No flowsheet data found.

## 2020-07-04 DIAGNOSIS — L89304 Pressure ulcer of unspecified buttock, stage 4: Secondary | ICD-10-CM | POA: Diagnosis not present

## 2020-07-04 LAB — SEDIMENTATION RATE: Sed Rate: 67 mm/hr — ABNORMAL HIGH (ref 0–16)

## 2020-07-04 LAB — COMPREHENSIVE METABOLIC PANEL
ALT: 11 U/L (ref 0–44)
AST: 27 U/L (ref 15–41)
Albumin: 1.9 g/dL — ABNORMAL LOW (ref 3.5–5.0)
Alkaline Phosphatase: 44 U/L (ref 38–126)
Anion gap: 9 (ref 5–15)
BUN: 11 mg/dL (ref 6–20)
CO2: 24 mmol/L (ref 22–32)
Calcium: 7.4 mg/dL — ABNORMAL LOW (ref 8.9–10.3)
Chloride: 106 mmol/L (ref 98–111)
Creatinine, Ser: 0.43 mg/dL — ABNORMAL LOW (ref 0.61–1.24)
GFR, Estimated: >60 mL/min (ref >60)
Glucose, Bld: 95 mg/dL (ref 70–99)
Potassium: 3.9 mmol/L (ref 3.5–5.1)
Sodium: 139 mmol/L (ref 135–145)
Total Bilirubin: 0.2 mg/dL — ABNORMAL LOW (ref 0.3–1.2)
Total Protein: 5.1 g/dL — ABNORMAL LOW (ref 6.5–8.1)

## 2020-07-04 LAB — MAGNESIUM: Magnesium: 1.8 mg/dL (ref 1.7–2.4)

## 2020-07-04 LAB — T4, FREE: Free T4: 1.17 ng/dL — ABNORMAL HIGH (ref 0.61–1.12)

## 2020-07-04 LAB — C-REACTIVE PROTEIN: CRP: 2.5 mg/dL — ABNORMAL HIGH (ref <1.0)

## 2020-07-04 MED ORDER — MIDODRINE HCL 5 MG PO TABS
10.0000 mg | ORAL_TABLET | Freq: Three times a day (TID) | ORAL | Status: DC | PRN
  Administered 2020-07-13 – 2020-07-15 (×3): 10 mg via ORAL
  Filled 2020-07-04 (×6): qty 2

## 2020-07-04 MED ORDER — CALCIUM GLUCONATE-NACL 2-0.675 GM/100ML-% IV SOLN
2.0000 g | Freq: Once | INTRAVENOUS | Status: AC
  Administered 2020-07-04: 2000 mg via INTRAVENOUS
  Filled 2020-07-04: qty 100

## 2020-07-04 MED ORDER — LEVETIRACETAM 500 MG PO TABS: 500.0000 mg | ORAL_TABLET | Freq: Two times a day (BID) | ORAL | Status: DC

## 2020-07-04 MED ORDER — LEVETIRACETAM 100 MG/ML PO SOLN
500.0000 mg | Freq: Two times a day (BID) | ORAL | Status: DC
  Administered 2020-07-04 – 2020-07-20 (×32): 500 mg via ORAL
  Filled 2020-07-04 (×32): qty 5

## 2020-07-04 MED ORDER — ASPIRIN 81 MG PO CHEW
81.0000 mg | CHEWABLE_TABLET | Freq: Every day | ORAL | Status: DC
  Administered 2020-07-04 – 2020-07-20 (×17): 81 mg via ORAL
  Filled 2020-07-04 (×17): qty 1

## 2020-07-04 NOTE — Progress Notes (Addendum)
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  Noticed to be breathing faster and coughing. Patient was followed by infectious disease clinic with plan for 6 weeks of IV antibiotics via PICC line.  On presentation he was on oral antibiotics cefdinir and p.o. Flagyl. CT scan of the chest abdomen pelvis showed right lower lobe consolidation with mucoid material on the right lower lobe bronchus. Decubitus ulceration, erosive changes of the sacrum. Patient was treated as sepsis, likely multifactorial, and admitted to the hospital.  In the evening of 06/30/20 patient was transferred to stepdown unit due to severe hypotension, received IV fluid boluses.  Due to persistent hypotension he was started on midodrine 10 mg 3 times daily on 07/02/2020, held on 07/04/20 AM.  Seen by ID, Dr. Linus Salmons recommended for his chronic osteomyelitis to c/w Ancef 2 g q 8h and po flagyl 500 mg q 8h, plan for 6 weeks, end date 08/05/20.  We highly appreciate ID's assistance.  07/04/20: Seen and examined with his dad at bedside.  He is more alert today.  BP is much improved.  Midodrine held.  Downgrading to telemetry from stepdown on 07/04/20.  Assessment/Plan: Active Problems:   Pressure injury of skin   Sepsis ([place])  Severe Sepsis, improving, likely multifactorial, secondary to right lower lobe pneumonia with hypoxia, chronic sacral and left trochanteric osteomyelitis  -Suspect aspiration with both gram-positive and gram-negative bacteremia; received Cefepime x 4 days, IV vancomycin, Flagyl. -Pt had been on cefdinir and Flagyl as outpatient PTA.   -Blood cultures 11/9 are negative to date. -seen by SLP, s/p modified barium swallow Currently on dysphagia 1 thin liquid diet for moderate aspiration risk. Continue aspiration precautions and feeding assistance. Procalcitonin level has normalized. Chest x-ray done on 07/02/2020 no evidence of lobular infiltrates. Currently afebrile with no leukocytosis. Initially requiring IV fluid boluses and Midodrine for hypotension which has now resolved. Now off midodrine on 07/04/20.  Acute hypoxic respiratory failure secondary to right lower lobe pneumonia, atelectasis Not on O2 supplementation at baseline Currently on 2 L with O2 saturation 98%. Treated for pneumonia Incentive spirometer for atelectasis O2 sat 98% RA  Chest congestion with productive cough Pulmonary toilet Mucinex 1200 mg twice daily x3 days Hypersaline nebs 3 times daily x3 days Chest PT 3 times daily x3 days  Intractable Hypocalcemia from poor dietary intake Corrected ca2+ for albumin 8.3 on 07/03/20 Ordered 2 g IV ca2+ gluconate once, repeated dose on 07/04/20 Repeat BMP in the AM  Chronic pressure ulcers/sacral and left trochanteric chronic osteomyelitis: Present on admission.  -Blood cx 11/9 confirmed neg -PICC placed by IR for long-term abx, placed on 06/25/20 -Completed 4 days of IV Cefepime and IV vancomycin -ESR 67 on 07/04/20. -CRP 2.5 on 07/04/20 -Seen by ID, Dr. Linus Salmons on 07/03/20, recommended for his chronic osteomyelitis to c/w Ancef 2 g q 8h and po flagyl 500 mg q 8h, plan for 6 weeks, end date 08/05/20. -Appreciate ID recs  Resolved hypotension, off Midodrine 07/04/20 Received IV fluid bolus normal saline 1 L on 07/02/2020 Resume IV fluid normal saline KCl 20 mEq at 100 cc an hour on 07/02/2020 Started midodrine 10 mg 3 times daily on 07/02/2020>> last dose on  07/03/20. MAP greater than 65  Dysphagia 1 Seen by speech therapist Management per speech therapist recommendation Aspiration  precautions.  Severe protein calorie malnutrition Albumin 1.9 BMI 19 Severe muscle mass loss Continue to encourage increase in oral protein calorie intake Continue oral supplement Feeding assistance  Hyperlipidemia:  -Continue statin as tolerated  Seizure disorder:  -Remains stable on  IV Keppra, switch to po keppra 07/04/20. Seizures precautions  Down syndrome/bedbound status/contractures and incontinence:  -Lives at home with family. -Plans noted for SNF placement.  TOC following for SNF placement  Resolved hypernatremia:  -Due to free water deficit. Treated with isotonic fluid, normalized  Resolved post repletion: Hypokalemia: Serum potassium 3.9 on 07/04/2020. Magnesium 1.8, repleted orally with magnesium oxide 800 mg daily.  Transient Vtach -Continue to monitor on telemetry Optimize potassium and magnesium levels Off home Coreg due to hypotension.  DVT prophylaxis: Heparin subq 3 times daily. Code Status: Full Family Communication:  Updated his father at bedside 07/04/20.    Consultants:   ID  IR  Procedures:   PICC placed under anesthesia 11/4 by IR    Status is: Inpatient   Dispo: The patient is from: Home.              Anticipated d/c is to: SNF.              Anticipated d/c date is: 07/06/2020.               Patient currently not appropriate for discharge due to ongoing treatment for osteomyelitis.         Objective: Vitals:   07/04/20 1000 07/04/20 1100 07/04/20 1200 07/04/20 1340  BP:   126/79 125/63  Pulse: 73 88 (!) 106 78  Resp: 14   18  Temp:    98.3 F (36.8 C)  TempSrc:    Oral  SpO2: 98% 97% 97%   Weight:      Height:        Intake/Output Summary (Last 24 hours) at 07/04/2020 1512 Last data filed at 07/04/2020 1200 Gross per 24 hour  Intake 3033.16 ml  Output 3025 ml  Net 8.16 ml   Filed Weights   06/22/20 1713 07/01/20 0000  Weight: 40.8 kg (P) 45.4 kg    Exam:  . General: 54 y.o. year-old male  Chronically ill appearing in no acute distress. Alert and non verbal. . Cardiovascular: RRR no rubs or gallops.  Marland Kitchen Respiratory: CTA no wheezes or rales . Abdomen: Soft NT ND NBS . Musculoskeletal: No LE edema bilaterally . Psychiatry: Mood is appropriate  Data Reviewed: CBC: Recent Labs  Lab 07/01/20 0520 07/02/20 0510  WBC 3.3* 4.1  HGB 9.9* 10.0*  HCT 32.9* 33.5*  MCV 103.1* 103.1*  PLT 236 119   Basic Metabolic Panel: Recent Labs  Lab 06/29/20 0519 06/29/20 0810 06/30/20 0640 07/01/20 0520 07/02/20 0510 07/03/20 0555 07/04/20 0500  NA   < >  --  136 135 136 141 139  K   < >  --  3.5 3.8 3.4* 4.0 3.9  CL   < >  --  100 105 103 112* 106  CO2   < >  --  30 27 24 23 24   GLUCOSE   < >  --  109* 114* 105* 97 95  BUN   < >  --  15 16 15 9 11   CREATININE   < >  --  0.55* 0.39* 0.43* 0.41* 0.43*  CALCIUM   < >  --  7.7* 7.5* 7.6* 7.0* 7.4*  MG  --  2.0 2.0 2.0  --  1.7 1.8  PHOS  --   --   --   --   --  2.9  --    < > = values in this interval not displayed.   GFR: Estimated Creatinine Clearance: 60.2 mL/min (A) (by C-G formula based on SCr of 0.43 mg/dL (L)). Liver Function Tests: Recent Labs  Lab 06/28/20 0500 06/29/20 0519 06/30/20 0640 07/01/20 0520 07/04/20 0500  AST 17 17 27 28 27   ALT 7 6 8 7 11   ALKPHOS 53 53 52 47 44  BILITOT 0.4 0.2* 0.4 0.3 0.2*  PROT 5.7* 5.4* 5.2* 5.2* 5.1*  ALBUMIN 2.4* 2.1* 2.0* 2.4* 1.9*   No results for input(s): LIPASE, AMYLASE in the last 168 hours. No results for input(s): AMMONIA in the last 168 hours. Coagulation Profile: No results for input(s): INR, PROTIME in the last 168 hours. Cardiac Enzymes: No results for input(s): CKTOTAL, CKMB, CKMBINDEX, TROPONINI in the last 168 hours. BNP (last 3 results) No results for input(s): PROBNP in the last 8760 hours. HbA1C: No results for input(s): HGBA1C in the last 72 hours. CBG: No results for input(s): GLUCAP in the last 168 hours. Lipid Profile: No results for input(s):  CHOL, HDL, LDLCALC, TRIG, CHOLHDL, LDLDIRECT in the last 72 hours. Thyroid Function Tests: Recent Labs    07/03/20 1808 07/04/20 0500  TSH 7.729*  --   FREET4  --  1.17*   Anemia Panel: Recent Labs    07/03/20 1808  VITAMINB12 790  FOLATE 12.1   Urine analysis:    Component Value Date/Time   COLORURINE YELLOW 06/22/2020 [place] 06/22/2020 2224   LABSPEC 1.035 (H) 06/22/2020 2224   PHURINE 5.0 06/22/2020 2224   GLUCOSEU NEGATIVE 06/22/2020 2224   HGBUR NEGATIVE 06/22/2020 2224   BILIRUBINUR NEGATIVE 06/22/2020 2224   KETONESUR NEGATIVE 06/22/2020 2224   PROTEINUR NEGATIVE 06/22/2020 2224   NITRITE NEGATIVE 06/22/2020 2224   LEUKOCYTESUR TRACE (A) 06/22/2020 2224   Sepsis Labs: @LABRCNTIP (procalcitonin:4,lacticidven:4)  ) Recent Results (from the past 240 hour(s))  Culture, blood (routine x 2)     Status: None (Preliminary result)   Collection Time: 06/30/20  6:02 PM   Specimen: BLOOD LEFT HAND  Result Value Ref Range Status   Specimen Description   Final    BLOOD LEFT HAND Performed at [facility], [place] [address], [place], [place] [address]    Special Requests   Final    BOTTLES DRAWN AEROBIC ONLY Blood Culture results may not be optimal due to an inadequate volume of blood received in culture bottles Performed at [place] [address] [place], [place] [address]    Culture   Final    NO GROWTH 4 DAYS Performed at [facility], [place] [address] [place], [place] [address]    Report Status PENDING  Incomplete  Culture, blood (routine x 2)     Status: None (Preliminary result)   Collection Time: 06/30/20  6:02 PM   Specimen: BLOOD LEFT FOREARM  Result Value Ref Range Status   Specimen Description   Final    BLOOD LEFT FOREARM Performed at [facility] [address], [place], [place] [address]    Special Requests   Final    BOTTLES DRAWN AEROBIC ONLY Blood Culture  adequate volume Performed at [facility] [address], [place], [place] [address]  Culture   Final    NO GROWTH 4 DAYS Performed at [facility], [place] [address], [place], [place] [address]    Report Status PENDING  Incomplete      Studies: No results found.  Scheduled Meds: . aspirin  81 mg Oral Daily  . Chlorhexidine Gluconate Cloth  6 each Topical Daily  . feeding supplement  237 mL Oral BID BM  . feeding supplement (PRO-STAT SUGAR FREE 64)  30 mL Oral BID  . finasteride  5 mg Oral Daily  . guaiFENesin  10 mL Oral TID  . heparin  5,000 Units Subcutaneous Q8H  . iron polysaccharides  150 mg Oral Daily  . loratadine  10 mg Oral Daily  . magnesium oxide  800 mg Oral Daily  . mouth rinse  15 mL Mouth Rinse BID  . metroNIDAZOLE  500 mg Oral Q8H  . nutrition supplement (JUVEN)  1 packet Oral BID BM  . pravastatin  40 mg Oral Daily  . selenium sulfide   Topical Daily  . sodium chloride HYPERTONIC  4 mL Nebulization TID  . vitamin B-12  500 mcg Oral Daily  . zinc sulfate  220 mg Oral Daily    Continuous Infusions: . calcium gluconate    .  ceFAZolin (ANCEF) IV 2 g (07/04/20 1501)  . levETIRAcetam 500 mg (07/04/20 1024)     LOS: 12 days     Kayleen Memos, MD [facility] Pager [phone]  If 7PM-7AM, please contact night-coverage [email or web address] Password [email or web address] 07/04/2020, 3:12 PM

## 2020-07-04 NOTE — Plan of Care (Signed)
  Problem: Education: Goal: Knowledge of General Education information will improve Description Including pain rating scale, medication(s)/side effects and non-pharmacologic comfort measures Outcome: Progressing   

## 2020-07-05 DIAGNOSIS — L89304 Pressure ulcer of unspecified buttock, stage 4: Secondary | ICD-10-CM | POA: Diagnosis not present

## 2020-07-05 LAB — COMPREHENSIVE METABOLIC PANEL
ALT: 10 U/L (ref 0–44)
AST: 23 U/L (ref 15–41)
Albumin: 2.1 g/dL — ABNORMAL LOW (ref 3.5–5.0)
Alkaline Phosphatase: 47 U/L (ref 38–126)
Anion gap: 8 (ref 5–15)
BUN: 14 mg/dL (ref 6–20)
CO2: 28 mmol/L (ref 22–32)
Calcium: 7.9 mg/dL — ABNORMAL LOW (ref 8.9–10.3)
Chloride: 103 mmol/L (ref 98–111)
Creatinine, Ser: 0.58 mg/dL — ABNORMAL LOW (ref 0.61–1.24)
GFR, Estimated: >60 mL/min (ref >60)
Glucose, Bld: 106 mg/dL — ABNORMAL HIGH (ref 70–99)
Potassium: 3.4 mmol/L — ABNORMAL LOW (ref 3.5–5.1)
Sodium: 139 mmol/L (ref 135–145)
Total Bilirubin: 0.2 mg/dL — ABNORMAL LOW (ref 0.3–1.2)
Total Protein: 5.1 g/dL — ABNORMAL LOW (ref 6.5–8.1)

## 2020-07-05 LAB — CULTURE, BLOOD (ROUTINE X 2)
Culture: NO GROWTH
Culture: NO GROWTH
Special Requests: ADEQUATE

## 2020-07-05 LAB — GLUCOSE, CAPILLARY: Glucose-Capillary: 124 mg/dL — ABNORMAL HIGH (ref 70–99)

## 2020-07-05 MED ORDER — KCL-LACTATED RINGERS-D5W 20 MEQ/L IV SOLN
INTRAVENOUS | Status: DC
  Filled 2020-07-05 (×2): qty 1000

## 2020-07-05 MED ORDER — POTASSIUM CHLORIDE CRYS ER 20 MEQ PO TBCR
40.0000 meq | EXTENDED_RELEASE_TABLET | Freq: Once | ORAL | Status: AC
  Administered 2020-07-05: 40 meq via ORAL
  Filled 2020-07-05: qty 2

## 2020-07-05 MED ORDER — SACCHAROMYCES BOULARDII 250 MG PO CAPS
250.0000 mg | ORAL_CAPSULE | Freq: Two times a day (BID) | ORAL | Status: DC
  Administered 2020-07-05 – 2020-07-20 (×31): 250 mg via ORAL
  Filled 2020-07-05 (×31): qty 1

## 2020-07-05 NOTE — Progress Notes (Addendum)
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  Noticed to be breathing faster and coughing. Patient was followed by infectious disease clinic with plan for 6 weeks of IV antibiotics via PICC line.  On presentation he was on oral antibiotics cefdinir and p.o. Flagyl. CT scan of the chest abdomen pelvis showed right lower lobe consolidation with mucoid material on the right lower lobe bronchus. Decubitus ulceration, erosive changes of the sacrum. Patient was treated as sepsis, likely multifactorial, and admitted to the hospital.  In the evening of 06/30/20 patient was transferred to stepdown unit due to severe hypotension, received IV fluid boluses.  Due to persistent hypotension he was started on midodrine 10 mg 3 times daily on 07/02/2020, held on 07/04/20 AM.  Seen by ID, Dr. Linus Salmons recommended for his chronic osteomyelitis to c/w Ancef 2 g q 8h and po flagyl 500 mg q 8h, plan for 6 weeks, end date 08/05/20.  We highly appreciate ID's assistance.  Midodrine held on 07/04/2020, he has been maintaining his MAP well over greater than 65.  07/05/20: Seen and examined.  No family members at bedside.  He is more alert today does not appear to be in distress.  Vital signs reviewed and are stable.  95% on room air.  Mild hypokalemia, potassium 3.4, replacement ordered.  Corrected calcium for albumin 10.9.    Assessment/Plan: Active Problems:   Pressure injury of skin   Sepsis ([place])  Severe Sepsis, improving, likely multifactorial, secondary to right lower lobe pneumonia with hypoxia, chronic sacral and left trochanteric  osteomyelitis  -Suspect aspiration with both gram-positive and gram-negative bacteria; received Cefepime x 4 days, IV vancomycin, Flagyl. -Pt was on cefdinir and Flagyl as outpatient PTA.  -Blood cultures 11/9 are negative to date. -seen by SLP, s/p modified barium swallow Currently on dysphagia 1 thin liquid diet for moderate aspiration risk. Continue aspiration precautions and feeding assistance. Procalcitonin level has normalized. Chest x-ray done on 07/02/2020 no evidence of lobular infiltrates. Currently afebrile with no leukocytosis. Initially required IV fluid boluses and Midodrine for severe hypotension, hypotension has resolved and has been off midodrine since 07/04/2020.  Chronic pressure ulcers/sacral and left trochanteric chronic osteomyelitis: Present on admission.  -Blood cx 11/9 confirmed neg -PICC placed by IR for long-term abx, on 06/25/20 -Completed 4 days of IV Cefepime and IV vancomycin -ESR 67 on 07/04/20. -CRP 2.5 on 07/04/20 -Seen by ID, Dr. Linus Salmons on 07/03/20, for his chronic osteomyelitis recommended to continue Ancef 2 g q 8h and po flagyl 500 mg q 8h, plan for 6 weeks, end date 08/05/20. -Appreciate ID recs  Loose stools/hypokalemia from GI losses Currently on prolonged antibiotics Started probiotics Florastor 250 mg twice daily on 07/05/2020 He has been afebrile Nontoxic-appearing  Subclinical hypothyroidism in the setting of acute illness TSH 7.729, free T4 1.17 Recommend to repeat TSH and free T4 in 4-6 weeks at PCPs office when acute illness has resolved.  Resolved hypocalcemia from poor dietary intake Corrected ca2+ for albumin 8.3 on 07/03/20 Corrected calcium for albumin 10.9 on 07/05/2020, post repletion.  Resolved acute hypoxic respiratory failure secondary to right lower lobe pneumonia and atelectasis Not on O2 supplementation at baseline Currently on room air with  O2 saturation 95%. Has completed treatment for pneumonia. Incentive  spirometer for atelectasis  Resolved chest congestion with productive cough Continue pulmonary toilet as needed Mucinex 1200 mg twice daily x3 days Hypersaline nebs 3 times daily x3 days Chest PT 3 times daily x3 days  Resolved hypotension, off Midodrine 07/04/20 Received IV fluid bolus normal saline 1 L on 07/02/2020 Resume IV fluid normal saline KCl 20 mEq at 100 cc an hour on 07/02/2020 Started midodrine 10 mg 3 times daily on 07/02/2020>> last dose on 07/03/20. MAP greater than 65  Dysphagia 1 Seen by speech therapist Management per speech therapist recommendation Aspiration precautions.  Severe protein calorie malnutrition Albumin 1.9 BMI 19 Severe muscle mass loss Continue to encourage increase in oral protein calorie intake Continue oral supplement Feeding assistance  Hyperlipidemia:  Continue statin as tolerated  Seizure disorder:  Switched to po keppra on 07/04/20. Seizures precautions  Down syndrome/bedbound status/contractures and incontinence:  -Lives at home with family. -Plans for SNF placement.  TOC following for SNF placement  Resolved hypernatremia:  -Due to free water deficit. Treated with isotonic fluid, normalized -Off IV fluid  -Encourage po free water intake to avoid dehydration  Refractory Hypokalemia: Serum potassium 3.9 on 07/04/2020>> 3.4 on 07/05/20 Magnesium 1.8, repleted orally with magnesium oxide 800 mg daily.  Transient Vtach, no reported recurrences. Currently on telemetry Optimize potassium and magnesium levels Off home Coreg due to hypotension.  DVT prophylaxis: Heparin subq 3 times daily. Code Status: DNR Family Communication:  Updated his father at bedside 07/04/20.    Consultants:   ID  IR  Procedures:   PICC placed under anesthesia 11/4 by IR    Status is: Inpatient   Dispo: The patient is from: Home.              Anticipated d/c is to: SNF.              Anticipated d/c date is: 07/06/2020.                Patient currently not appropriate for discharge due to ongoing treatment for osteomyelitis.         Objective: Vitals:   07/04/20 2109 07/05/20 0357 07/05/20 0603 07/05/20 0743  BP: (!) 92/56 130/82 124/66   Pulse: 94 80 98   Resp:  19 20   Temp: 97.6 F (36.4 C) 97.8 F (36.6 C) 98 F (36.7 C)   TempSrc:  Axillary    SpO2: 96% 95% 95% 94%  Weight:      Height:        Intake/Output Summary (Last 24 hours) at 07/05/2020 1304 Last data filed at 07/05/2020 0600 Gross per 24 hour  Intake 530 ml  Output 1290 ml  Net -760 ml   Filed Weights   06/22/20 1713 07/01/20 0000  Weight: 40.8 kg (P) 45.4 kg    Exam:  . General: 54 y.o. year-old male chronically ill-appearing in no acute stress.  Alert and nonverbal.   . Cardiovascular: Regular rate and rhythm no rubs or gallops. Marland Kitchen Respiratory: Clear to auscultation no wheezes or rales.  . Abdomen: Soft nontender normal bowel sounds present. . Musculoskeletal: No lower extremity edema bilaterally.  Marland Kitchen Psychiatry: Mood is appropriate for condition and setting.  Data Reviewed: CBC: Recent Labs  Lab 07/01/20 0520 07/02/20 0510  WBC 3.3* 4.1  HGB 9.9* 10.0*  HCT 32.9* 33.5*  MCV 103.1* 103.1*  PLT 236 209   Basic Metabolic Panel: Recent Labs  Lab 06/29/20 0519 06/29/20  1448 06/30/20 0640 06/30/20 0640 07/01/20 0520 07/02/20 0510 07/03/20 0555 07/04/20 0500 07/05/20 0615  NA   < >  --  136   < > 135 136 141 139 139  K   < >  --  3.5   < > 3.8 3.4* 4.0 3.9 3.4*  CL   < >  --  100   < > 105 103 112* 106 103  CO2   < >  --  30   < > _0 GLUCOSE   < >  --  109*   < > 114* 105* 97 95 106*  BUN   < >  --  15   < > _1 CREATININE   < >  --  0.55*   < > 0.39* 0.43* 0.41* 0.43* 0.58*  CALCIUM   < >  --  7.7*   < > 7.5* 7.6* 7.0* 7.4* 7.9*  MG  --  2.0 2.0  --  2.0  --  1.7 1.8  --   PHOS  --   --   --   --   --   --  2.9  --   --    < > = values in this interval not displayed.    GFR: Estimated Creatinine Clearance: 60.2 mL/min (A) (by C-G formula based on SCr of 0.58 mg/dL (L)). Liver Function Tests: Recent Labs  Lab 06/29/20 0519 06/30/20 0640 07/01/20 0520 07/04/20 0500 07/05/20 0615  AST _2 ALT _3 ALKPHOS 53 52 47 44 47  BILITOT 0.2* 0.4 0.3 0.2* 0.2*  PROT 5.4* 5.2* 5.2* 5.1* 5.1*  ALBUMIN 2.1* 2.0* 2.4* 1.9* 2.1*   No results for input(s): LIPASE, AMYLASE in the last 168 hours. No results for input(s): AMMONIA in the last 168 hours. Coagulation Profile: No results for input(s): INR, PROTIME in the last 168 hours. Cardiac Enzymes: No results for input(s): CKTOTAL, CKMB, CKMBINDEX, TROPONINI in the last 168 hours. BNP (last 3 results) No results for input(s): PROBNP in the last 8760 hours. HbA1C: No results for input(s): HGBA1C in the last 72 hours. CBG: No results for input(s): GLUCAP in the last 168 hours. Lipid Profile: No results for input(s): CHOL, HDL, LDLCALC, TRIG, CHOLHDL, LDLDIRECT in the last 72 hours. Thyroid Function Tests: Recent Labs    07/03/20 1808 07/04/20 0500  TSH 7.729*  --   FREET4  --  1.17*   Anemia Panel: Recent Labs    07/03/20 1808  VITAMINB12 790  FOLATE 12.1   Urine analysis:    Component Value Date/Time   COLORURINE YELLOW 06/22/2020 2224   APPEARANCEUR CLEAR 06/22/2020 2224   LABSPEC 1.035 (H) 06/22/2020 2224   PHURINE 5.0 06/22/2020 2224   GLUCOSEU NEGATIVE 06/22/2020 2224   HGBUR NEGATIVE 06/22/2020 2224   BILIRUBINUR NEGATIVE 06/22/2020 2224   KETONESUR NEGATIVE 06/22/2020 2224   PROTEINUR NEGATIVE 06/22/2020 2224   NITRITE NEGATIVE 06/22/2020 2224   LEUKOCYTESUR TRACE (A) 06/22/2020 2224   Sepsis Labs: _4 (procalcitonin:4,lacticidven:4)  ) Recent Results (from the past 240 hour(s))  Culture, blood (routine x 2)     Status: None   Collection Time: 06/30/20  6:02 PM   Specimen: BLOOD LEFT HAND  Result Value Ref Range Status   Specimen Description    Final    BLOOD LEFT HAND Performed at [facility], [place] [address], [place], [place] [address]  Special Requests   Final    BOTTLES DRAWN AEROBIC ONLY Blood Culture results may not be optimal due to an inadequate volume of blood received in culture bottles Performed at [place] [address], [place], [place] [address]    Culture   Final    NO GROWTH 5 DAYS Performed at [facility], [place] [address], [place], [place] [address]    Report Status 07/05/2020 FINAL  Final  Culture, blood (routine x 2)     Status: None   Collection Time: 06/30/20  6:02 PM   Specimen: BLOOD LEFT FOREARM  Result Value Ref Range Status   Specimen Description   Final    BLOOD LEFT FOREARM Performed at [place] [address] [place], [place] [address]    Special Requests   Final    BOTTLES DRAWN AEROBIC ONLY Blood Culture adequate volume Performed at [place] [address], [place], [place] [address]    Culture   Final    NO GROWTH 5 DAYS Performed at [facility], [place] [address], [place], [place] [address]    Report Status 07/05/2020 FINAL  Final      Studies: No results found.  Scheduled Meds: . aspirin  81 mg Oral Daily  . Chlorhexidine Gluconate Cloth  6 each Topical Daily  . feeding supplement  237 mL Oral BID BM  . feeding supplement (PRO-STAT SUGAR FREE 64)  30 mL Oral BID  . finasteride  5 mg Oral Daily  . guaiFENesin  10 mL Oral TID  . heparin  5,000 Units Subcutaneous Q8H  . iron polysaccharides  150 mg Oral Daily  . levETIRAcetam  500 mg Oral BID  . loratadine  10 mg Oral Daily  . mouth rinse  15 mL Mouth Rinse BID  . metroNIDAZOLE  500 mg Oral Q8H  . nutrition supplement (JUVEN)  1 packet Oral BID BM  . potassium chloride  40 mEq Oral Once  . pravastatin  40 mg Oral Daily  . selenium sulfide   Topical Daily  . sodium chloride HYPERTONIC  4 mL Nebulization TID  . vitamin  B-12  500 mcg Oral Daily  . zinc sulfate  220 mg Oral Daily    Continuous Infusions: .  ceFAZolin (ANCEF) IV 2 g (07/05/20 0525)     LOS: 13 days     Kayleen Memos, MD [facility] Pager [phone]  If 7PM-7AM, please contact night-coverage [email or web address] Password [email or web address] 07/05/2020, 1:04 PM

## 2020-07-05 NOTE — Plan of Care (Signed)
  Problem: Clinical Measurements: Goal: Respiratory complications will improve Outcome: Progressing   Problem: Pain Managment: Goal: General experience of comfort will improve Outcome: Progressing   Problem: Safety: Goal: Ability to remain free from injury will improve Outcome: Progressing   Problem: Activity: Goal: Ability to tolerate increased activity will improve Outcome: Progressing   Problem: Respiratory: Goal: Ability to maintain a clear airway will improve Outcome: Progressing

## 2020-07-06 ENCOUNTER — Inpatient Hospital Stay (HOSPITAL_COMMUNITY): Payer: Medicare Other

## 2020-07-06 DIAGNOSIS — L89304 Pressure ulcer of unspecified buttock, stage 4: Secondary | ICD-10-CM | POA: Diagnosis not present

## 2020-07-06 LAB — BASIC METABOLIC PANEL
Anion gap: 5 (ref 5–15)
BUN: 14 mg/dL (ref 6–20)
CO2: 30 mmol/L (ref 22–32)
Calcium: 7.8 mg/dL — ABNORMAL LOW (ref 8.9–10.3)
Chloride: 104 mmol/L (ref 98–111)
Creatinine, Ser: 0.44 mg/dL — ABNORMAL LOW (ref 0.61–1.24)
GFR, Estimated: >60 mL/min (ref >60)
Glucose, Bld: 125 mg/dL — ABNORMAL HIGH (ref 70–99)
Potassium: 4.1 mmol/L (ref 3.5–5.1)
Sodium: 139 mmol/L (ref 135–145)

## 2020-07-06 LAB — FIBRINOGEN: Fibrinogen: 571 mg/dL — ABNORMAL HIGH (ref 210–475)

## 2020-07-06 LAB — SARS CORONAVIRUS 2 BY RT PCR (HOSPITAL ORDER, PERFORMED IN ~~LOC~~ HOSPITAL LAB): SARS Coronavirus 2: POSITIVE — AB

## 2020-07-06 LAB — D-DIMER, QUANTITATIVE: D-Dimer, Quant: 2.03 ug/mL-FEU — ABNORMAL HIGH (ref 0.00–0.50)

## 2020-07-06 LAB — RESPIRATORY PANEL BY RT PCR (FLU A&B, COVID)
Influenza A by PCR: NEGATIVE
Influenza B by PCR: NEGATIVE
SARS Coronavirus 2 by RT PCR: POSITIVE — AB

## 2020-07-06 LAB — BRAIN NATRIURETIC PEPTIDE: B Natriuretic Peptide: 173.9 pg/mL — ABNORMAL HIGH (ref 0.0–100.0)

## 2020-07-06 LAB — MAGNESIUM: Magnesium: 2 mg/dL (ref 1.7–2.4)

## 2020-07-06 LAB — FERRITIN: Ferritin: 117 ng/mL (ref 24–336)

## 2020-07-06 LAB — LACTATE DEHYDROGENASE: LDH: 145 U/L (ref 98–192)

## 2020-07-06 MED ORDER — EPINEPHRINE 0.3 MG/0.3ML IJ SOAJ
0.3000 mg | Freq: Once | INTRAMUSCULAR | Status: DC | PRN
  Filled 2020-07-06: qty 0.6

## 2020-07-06 MED ORDER — SODIUM CHLORIDE 0.9 % IV SOLN
INTRAVENOUS | Status: DC | PRN
  Administered 2020-07-07: 500 mL via INTRAVENOUS

## 2020-07-06 MED ORDER — SODIUM CHLORIDE 0.9 % IV SOLN
Freq: Once | INTRAVENOUS | Status: AC
  Filled 2020-07-06: qty 20

## 2020-07-06 MED ORDER — IPRATROPIUM-ALBUTEROL 20-100 MCG/ACT IN AERS
1.0000 | INHALATION_SPRAY | Freq: Four times a day (QID) | RESPIRATORY_TRACT | Status: DC
  Administered 2020-07-06 – 2020-07-16 (×32): 1 via RESPIRATORY_TRACT
  Filled 2020-07-06: qty 4

## 2020-07-06 MED ORDER — SACCHAROMYCES BOULARDII 250 MG PO CAPS: 250.0000 mg | ORAL_CAPSULE | Freq: Two times a day (BID) | ORAL | 0 refills | Status: AC

## 2020-07-06 MED ORDER — SODIUM CHLORIDE 0.9% FLUSH
10.0000 mL | Freq: Two times a day (BID) | INTRAVENOUS | Status: DC
  Administered 2020-07-07 – 2020-07-20 (×23): 10 mL

## 2020-07-06 MED ORDER — FAMOTIDINE IN NACL 20-0.9 MG/50ML-% IV SOLN: 20.0000 mg | Freq: Once | INTRAVENOUS | Status: DC | PRN

## 2020-07-06 MED ORDER — METHYLPREDNISOLONE SODIUM SUCC 125 MG IJ SOLR: 125.0000 mg | Freq: Once | INTRAMUSCULAR | Status: DC | PRN

## 2020-07-06 MED ORDER — DIPHENHYDRAMINE HCL 50 MG/ML IJ SOLN: 50.0000 mg | Freq: Once | INTRAMUSCULAR | Status: DC | PRN

## 2020-07-06 MED ORDER — CEFAZOLIN IV (FOR PTA / DISCHARGE USE ONLY): 2.0000 g | Freq: Three times a day (TID) | INTRAVENOUS | 0 refills | Status: DC

## 2020-07-06 MED ORDER — ALBUTEROL SULFATE HFA 108 (90 BASE) MCG/ACT IN AERS: 2.0000 | INHALATION_SPRAY | Freq: Once | RESPIRATORY_TRACT | Status: DC | PRN

## 2020-07-06 MED ORDER — SODIUM CHLORIDE 0.9% FLUSH
10.0000 mL | INTRAVENOUS | Status: DC | PRN
  Administered 2020-07-07 – 2020-07-15 (×2): 10 mL

## 2020-07-06 MED ORDER — VITAMIN D 25 MCG (1000 UNIT) PO TABS
1000.0000 [IU] | ORAL_TABLET | Freq: Every day | ORAL | Status: DC
  Administered 2020-07-06 – 2020-07-20 (×15): 1000 [IU] via ORAL
  Filled 2020-07-06 (×15): qty 1

## 2020-07-06 MED ORDER — ASCORBIC ACID 500 MG PO TABS
500.0000 mg | ORAL_TABLET | Freq: Every day | ORAL | Status: DC
  Administered 2020-07-06 – 2020-07-20 (×15): 500 mg via ORAL
  Filled 2020-07-06 (×15): qty 1

## 2020-07-06 MED ORDER — METRONIDAZOLE 500 MG PO TABS
500.0000 mg | ORAL_TABLET | Freq: Three times a day (TID) | ORAL | 0 refills | Status: DC
Start: 2020-07-06 — End: 2020-07-20

## 2020-07-06 NOTE — Progress Notes (Addendum)
Patient incidentally tested positive for COVID-19 viral infection x 2 on 07/06/20.  Test was ordered to screen prior to SNF placement.  Personally reviewed CXR done on the same day which showed left sided lung infiltrates concerning for developing Covid-19 viral pneumonia.  He is currently not hypoxic with O2 sat 95% RA.  Discussed findings with my partner Dr. Jarvis Newcomer who will follow up in the morning.  Decision was made to provide monoclonal antibodies due to potential risks of developing worsening pneumonia.  I, the Clinical research associate, obtained consent from his legal guardian, his sister Tracy Todd, via phone on 07/06/20 around 1830 to use Covid-19 directed therapies as indicated.

## 2020-07-06 NOTE — Care Management Important Message (Signed)
Important Message  Patient Details IM Letter given to the Patient. Name: Tracy Todd MRN: [id number] Date of Birth: [birth year]   Medicare Important Message Given:  Yes     Caren Macadam 07/06/2020, 12:07 PM

## 2020-07-06 NOTE — TOC Progression Note (Signed)
Transition of Care [facility]) - Progression Note    Patient Details  Name: Tracy Todd MRN: [id number] Date of Birth: 28-May-1966  Transition of Care [facility]) CM/SW Contact  Tracy Todd, Tracy Messier, RN Phone Number: 07/06/2020, 12:45 PM  Clinical Narrative: Patient has down's syndrome.Spoke to Patient's sister-Tracy Todd about d/c-she says she doesn't know why he is being d/c-MD notified. Tracy Todd has not chosen a facility-patient has bed offers.d/c order in place.Informed Tracy Todd of her right to appeal process.     Expected Discharge Plan: Skilled Nursing Facility Barriers to Discharge: Awaiting State Approval Tracy Todd)  Expected Discharge Plan and Services Expected Discharge Plan: Skilled Nursing Facility       Living arrangements for the past 2 months: Single Family Home Expected Discharge Date: 07/06/20                                     Social Determinants of Health (SDOH) Interventions    Readmission Risk Interventions No flowsheet data found.

## 2020-07-06 NOTE — Progress Notes (Signed)
Patient received to room 1528 after received report from RN.  Skin assessed.  Pt appears to be in no distress.  Vital signs obtained.  Left PICC with NS @ [facility].  2nd COVID test obtained and taken to the lab.  Will review orders and continue to monitor.

## 2020-07-06 NOTE — Progress Notes (Signed)
Reported by bedside RN positive Covid-19 screening test on 07/06/20, requested for SNF placement.  Patient has been afebrile with O2 sats in the mid 90's.  Loose stools was reported on 07/05/20, patient is incontinent of urine and stools.  Will obtain stat CXR and repeat Covid-19 screening test.  Airborne precautions in place.

## 2020-07-06 NOTE — TOC Progression Note (Addendum)
Transition of Care [facility]) - Progression Note    Patient Details  Name: Tracy Todd MRN: [id number] Date of Birth: Aug 06, 1966  [facility]) CM/SW Contact  Bunnie Rehberg, Olegario Messier, RN Phone Number: 07/06/2020, 2:41 PM  Clinical Narrative: Noted patient covid + per current results,for transfer to 5W, noted MD-d/c order removed-appeal withdrawn.Await medical stability, & SNF choice-Lisa sister informed-voiced understanding. Current- D/c plan SNF-has picc-long term iv abx-ancef.    Expected Discharge Plan: Skilled Nursing Facility Barriers to Discharge: Awaiting State Approval [facility])  Expected Discharge Plan and Services Expected Discharge Plan: Skilled Nursing Facility       Living arrangements for the past 2 months: Single Family Home Expected Discharge Date: 07/06/20                                     Social Determinants of Health (SDOH) Interventions    Readmission Risk Interventions No flowsheet data found.

## 2020-07-06 NOTE — Progress Notes (Signed)
CRITICAL VALUE ALERT  Critical Value:  (+) COVID   Date & Time Notied:  07/06/20  1415  Provider Notified: Dr Margo Aye   Orders Received/Actions taken: see new orders

## 2020-07-06 NOTE — Progress Notes (Signed)
Report given to receiving RN on 5 w

## 2020-07-06 NOTE — Progress Notes (Signed)
Pharmacy COVID-19 Monoclonal Antibody Screening  Tracy Todd was identified as being not hospitalized with symptoms from Covid-19 on admission but an incidental positive PCR has been documented.  The patient may qualify for the use of monoclonal antibodies (mAB) for COVID-19 viral infection to prevent worsening symptoms stemming from Covid-19 infection.  The patient was identified based on a positive COVID-19 PCR and not requiring the use of supplemental oxygen at this time.  This patient meets the FDA criteria for Emergency Use Authorization of casirivimab/imdevimab or bamlanivimab/etesevimab.  Has a (+) direct SARS-CoV-2 viral test result  Is NOT hospitalized due to COVID-19  Is within 10 days of symptom onset  Has at least one of the high risk factor(s) for progression to severe COVID-19 and/or hospitalization as defined in EUA.  Specific high risk criteria : Neurodevelopmental disorder  Additionally: The patient has not had a positive COVID-19 PCR in the last 90 days.  The patient is fully vaccinated against COVID-19.  Since the patient is partially or fully vaccinated for COVID-19, is asymptomatic with a cycle time of < 32, and meets high risk criteria, the patient is eligible for mAB administration. (Per lab, cycle threshold (CT) count 30.1).    This eligibility and indication for treatment was discussed with the patient's physician: Dr. Margo Aye  Plan: Based on the above discussion, it was decided that the patient will receive one dose of the available COVID-19 mAB combination. Pharmacy will coordinate administration timing with patient's nurse. Recommended infusion monitoring parameters communicated to the nursing team.   Jamse Mead 07/06/2020  7:22 PM

## 2020-07-06 NOTE — Progress Notes (Signed)
Pt's sister Misty Stanley on unit and made aware of (+) Covid test result and of transfer to 1528

## 2020-07-06 NOTE — Discharge Summary (Addendum)
Discharge Summary  Tracy Todd YQM:[id number] DOB: 1965/09/29  PCP: Chesley Noon, MD  Admit date: 06/22/2020 Discharge date: 07/06/2020  Time spent: 35 minutes.  Recommendations for Outpatient Follow-up:  1. Follow-up with infectious disease in 1 to 2 weeks 2. Follow-up with your primary care provider 3. Follow-up with cardiology 4. Take your medications as prescribed 5. Please provide feeding assistance 6. Please encourage oral intake as tolerated 7. Please avoid dehydration 8. Please continue aspiration precautions  Recommendations per speech therapist: SLP Visit Diagnosis: Dysphagia, oropharyngeal phase      Aspiration Risk  Moderate aspiration risk    Diet Recommendation Dysphagia 1 (Puree);Thin liquid   Medication Administration: Whole meds with liquid (CRUSH IF CAPSULES or LARGE) Supervision: Staff to assist with self feeding;Full supervision/cueing for compensatory strategies Compensations: Minimize environmental distractions;Slow rate;Small sips/bites Postural Changes: Remain upright for at least 30 minutes after po intake (reverse trendelenburg position)    Other  Recommendations Oral Care Recommendations: Oral care BID   Follow up Recommendations 24 hour supervision/assistance        Recommendations per wound care specialist: Reason for Consult: Chronic, 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 Periwound epibole and moist smooth wound bed, nongranulating Sacrum: 8cm x 6cm x 0.2cm with moist, smooth, nongranulating wound bed, epibole. Wound bed:As noted above Drainage (amount, consistency, odor) moderate amount, serous Periwound: As noted above Dressing procedure/placement/frequency: I will provide Nursing with guidance for management of these chronic,  nonhealing wounds using a silver hydrofiber (Aquacel Advantage) which will confine exudate to wound and allow for vertical wicking, protecting the periwound tissue. A mattress replacement with low air loss feature is provided as patient has just one intact turning surface, the right hip. Heels are intact and will be floated, guidance for product use to prevent from skin damage related to fecal incontinence is provided.     Discharge Diagnoses:  Active Hospital Problems   Diagnosis Date Noted  . Sepsis ([place]) 06/22/2020  . Pressure injury of skin 02/06/2020    Resolved Hospital Problems  No resolved problems to display.    Discharge Condition: Stable  Diet recommendation: Was seen by speech therapist who has recommended a dysphagia 1 diet, pure, with thin liquid.  Vitals:   07/06/20 0619 07/06/20 0627  BP: 94/76   Pulse: 67   Resp:    Temp: 98.1 F (36.7 C)   SpO2: 94% 95%    History of present illness:  Tracy Todd is a 54 year old gentleman with history of seizure disorder, down syndrome, recurrent syncope, hyperlipidemia, bedbound with contractures, nonverbal, chronic nonhealing decubitus ulcers with chronic osteomyelitis who was brought to [facility] ER from home with low-grade fever and a productive cough.  Followed outpatient by ID clinic with plan for 6 weeks of antibiotics for chronic osteomyelitis affecting his sacrum and left hip.  On presentation he was on oral antibiotics cefdinir and p.o. Flagyl. CT scan of the chest abdomen pelvis with contrast done on 06/22/2020 showed right lower lobe consolidation with mucoid material on the right lower lobe bronchus, decubitus ulceration, erosive changes of the sacrum. Was admitted by [facility], hospitalist team, for sepsis secondary to aspiration pneumonia and chronic sacrum/left hip osteomyelitis.    Hospital course complicated by severe hypotension in the evening of 06/30/20.  Patient was transferred to stepdown unit due to severe hypotension,  received boluses of IV fluid.  Due to persistent hypotension he was started on midodrine 10 mg 3 times daily on 07/02/2020.  This was stopped on 07/04/20 AM as his MAP was well above 65 without the midodrine.  Seen by ID, Dr. Linus Salmons, on 07/03/2020, recommended for his chronic osteomyelitis to continue Ancef 2 g q 8h and to continue po flagyl 500 mg q 8h, plan for 6 weeks.    OPAT orders were completed by pharmacy.    Patient has required feeding assistance and we recommend to continue the same.  07/06/20: Patient was seen and examined at his bedside.  No family members in the room.  Patient is alert and does not appear in distress.  He is nonverbal.  No acute events overnight.  He tolerated his breakfast well with feeding assistance, per bedside nursing staff.  Vital signs and labs have been reviewed and are stable.  BP 94/76 with MAP of 84, heart rate 67, respiration rate 11, O2 saturation 95% on room air with temperature of 98.1 on 07/06/2020.  Serum sodium 139, serum potassium 4.1, serum magnesium 2.0, BUN 14, creatinine 0.44 with GFR greater than 60, serum glucose 125 on 07/06/2020.  Latest procalcitonin 0.20 on 07/03/2020, latest lactic acid 0.8 on 07/01/2020, latest WBC 4.1K, hemoglobin 10.0K from 9.9K, platelet 253K on 07/02/2020.    Hospital Course:  Active Problems:   Pressure injury of skin   Sepsis ([place])  Resolved severe Sepsis likely multifactorial, secondary to right lower lobe pneumonia, chronic sacral and left trochanteric osteomyelitis  -Suspect aspiration with both gram-positive and gram-negative bacteria; received Cefepime x 4 days, IV vancomycin, and Flagyl. -Pt was on cefdinir and Flagyl as outpatient prior to admission. -Blood cultures 06/30/20 are negative to date. -seen by SLP, s/p modified barium swallow, recommended dysphagia 1 thin liquid diet for moderate aspiration risk. Continue aspiration precautions and feeding assistance. Repeat chest x-ray done on 07/02/2020 no  evidence of lobular infiltrates. Afebrile and nontoxic-appearing. Required IV fluid boluses the night of 06/30/20 then midodrine thereafter.  Scheduled midodrine was discontinued on 07/04/2020.   He has maintained his MAP greater than 65. Follow-up with infectious disease and your primary care provider.  Chronic pressure ulcers/sacral and left trochanteric chronic osteomyelitis: Present on admission.  -Blood cx 06/30/20 confirmed negative. -PICC placed by IR for long-term abx, on 06/25/20 -Completed 4 days of IV Cefepime and IV vancomycin. -ESR 67 on 07/04/20. -CRP 2.5 on 07/04/20 -Seen by ID, Dr. Linus Salmons on 07/03/20, for chronic osteomyelitis, recommended to continue Ancef 2 g q 8h and po flagyl 500 mg q 8h, plan for 6 weeks. -Appreciate ID recs -OPAT completed by pharmacy with end date 08/17/2020. Follow-up with infectious disease in 1 to 2 weeks  Loose stools/resolved hypokalemia from GI losses Currently on prolonged antibiotics Continue probiotics Florastor 250 mg twice daily, started on 07/05/2020 Afebrile and nontoxic-appearing.  Subclinical hypothyroidism in the setting of acute illness TSH 7.729, free T4 1.17 Recommend to repeat TSH and free T4 in 6-8 weeks at PCPs office when acute illness has resolved. Follow-up with your PCP  Resolved hypocalcemia from poor dietary intake Corrected ca2+ for albumin 8.3 on 07/03/20 Corrected calcium for albumin 10.9 on 07/05/2020, post repletion. Follow-up with your PCP  Resolved acute hypoxic respiratory failure secondary to right lower lobe pneumonia and atelectasis Not on O2 supplementation at baseline Currently on room air with O2 saturation 95%. Has completed treatment for pneumonia. Continue incentive spirometer to prevent atelectasis  Resolved chest congestion with productive  cough Received pulmonary toilet, Mucinex 1200 mg twice daily x3 days, hypersaline nebs 3 times daily x3 days, and chest PT 3 times daily x3  days.  Resolved hypotension, off Midodrine 07/04/20 Received IV fluid bolus normal saline 1 L on 07/02/2020 Received IV fluid normal saline KCl 20 mEq at 100 cc an hour on 07/02/2020 Started midodrine 10 mg 3 times daily on 07/02/2020>> last dose was on 07/03/20. Has maintained his MAP greater than 65  Dysphagia 1 Seen by speech therapist Management per speech therapist recommendation Continue aspiration precautions. Continue feeding assistance  Severe protein calorie malnutrition Albumin 1.9>> 2.1 on 07/05/2020. BMI 19 Severe muscle mass loss Continue to encourage increase in oral protein calorie intake Continue oral supplement Continue feeding assistance  Hyperlipidemia:  Continue statin astolerated  Seizure disorder:  Switched to po keppra on 07/04/20. Continue seizures precautions  Down syndrome/bedbound status/contractures and incontinence: -Presented from home where he lived with family. -Plans forSNF placement.  -TOC assisting with SNF placement.  Resolved hypernatremia:  -Due to free water deficit. Treated with isotonic fluid, normalized -Off IV fluid, continue to encourage po free water intake to avoid dehydration. -Continue to provide feeding assistance.  Resolved Hypokalemia: Serum potassium 4.1 on 07/06/2020.   Magnesium 2.0 on 07/06/2020.   Follow-up with your PCP.    Transient Vtach, no reported recurrences. Potassium and magnesium levels at goal. Off home Coreg due to hypotension. Follow-up with your cardiologist  QTC prolongation QTC 521 on 07/04/2020 twelve-lead EKG Avoid QTC prolonging agents Potassium 4.1 and magnesium serum 2.0, levels are at goal. Follow-up with your cardiologist.    Code Status:DNR  Family Communication: Updated his sister at bedside 07/05/20.    Consultants:  ID  IR  Pharmacy  Procedures:  PICC placed under anesthesia 11/4 by IR    Discharge Exam: BP 94/76 (BP Location: Right  Arm)   Pulse 67   Temp 98.1 F (36.7 C)   Resp 18   Ht (P) 5' (1.524 m)   Wt (P) 45.4 kg   SpO2 95%   BMI (P) 19.53 kg/m  . General: 54 y.o. year-old male frail-appearing in no acute distress.  Alert and nonverbal. . Cardiovascular: Regular rate and rhythm with no rubs or gallops.  No thyromegaly or JVD noted.   Marland Kitchen Respiratory: Clear to auscultation with no wheezes or rales. Good inspiratory effort. . Abdomen: Soft nontender nondistended with normal bowel sounds x4 quadrants. . Musculoskeletal: No lower extremity edema bilaterally.  Marland Kitchen Psychiatry: Mood is appropriate for condition and setting  Discharge Instructions You were cared for by a hospitalist during your hospital stay. If you have any questions about your discharge medications or the care you received while you were in the hospital after you are discharged, you can call the unit and asked to speak with the hospitalist on call if the hospitalist that took care of you is not available. Once you are discharged, your primary care physician will handle any further medical issues. Please note that NO REFILLS for any discharge medications will be authorized once you are discharged, as it is imperative that you return to your primary care physician (or establish a relationship with a primary care physician if you do not have one) for your aftercare needs so that they can reassess your need for medications and monitor your lab values.  Discharge Instructions    [facility] pharmacist to adjust dose for Vancomycin, Aminoglycosides and other anti-infective therapies as requested by physician.   Complete by: As  directed    [facility] to provide Cath Flo 54m   Complete by: As directed    Administer for PICC line occlusion and as ordered by physician for other access device issues.   Anaphylaxis Kit: Provided to treat any anaphylactic reaction to the medication being provided to the patient if First Dose or when requested by  physician   Complete by: As directed    Epinephrine 162mml vial / amp: Administer 0.46m846m0.46ml31mubcutaneously once for moderate to severe anaphylaxis, nurse to call physician and pharmacy when reaction occurs and call 911 if needed for immediate care   Diphenhydramine 50mg71mIV vial: Administer 25-50mg 90mM PRN for first dose reaction, rash, itching, mild reaction, nurse to call physician and pharmacy when reaction occurs   Sodium Chloride 0.9% NS 500ml I346mdminister if needed for hypovolemic blood pressure drop or as ordered by physician after call to physician with anaphylactic reaction   Change dressing on IV access line weekly and PRN   Complete by: As directed    Flush IV access with Sodium Chloride 0.9% and Heparin 10 units/ml or 100 units/ml   Complete by: As directed    Home infusion instructions - [facility]   Complete by: As directed    Instructions: Flush IV access with Sodium Chloride 0.9% and Heparin 10units/ml or 100units/ml   Change dressing on IV access line: Weekly and PRN   Instructions Cath Flo 2mg: Ad18mister for PICC Line occlusion and as ordered by physician for other access device   [facility] pharmacist to adjust dose for: Vancomycin, Aminoglycosides and other anti-infective therapies as requested by physician   Method of administration may be changed at the discretion of home infusion pharmacist based upon assessment of the patient and/or caregiver's ability to self-administer the medication ordered   Complete by: As directed      Allergies as of 07/06/2020      Reactions   Penicillins    Unknown Did it involve swelling of the face/tongue/throat, SOB, or low BP? Unk Did it involve sudden or severe rash/hives, skin peeling, or any reaction on the inside of your mouth or nose? Unk Did you need to seek medical attention at a hospital or doctor's office? Unk When did it last happen?childhood If all above answers are "NO", may proceed  with cephalosporin use.      Medication List    STOP taking these medications   bisacodyl 10 MG suppository Commonly known as: DULCOLAX   cefdinir 300 MG capsule Commonly known as: OMNICEF   magnesium hydroxide 400 MG/5ML suspension Commonly known as: MILK OF MAGNESIA   UNABLE TO FIND     TAKE these medications   acetaminophen 500 MG tablet Commonly known as: TYLENOL Take 500 mg by mouth every 6 (six) hours as needed for moderate pain.   Americaine 20 % oral spray Generic drug: benzocaine Apply 1 application topically 3 (three) times daily as needed for throat irritation / pain.   aspirin 81 MG EC tablet Take 1 tablet (81 mg total) by mouth daily.   ceFAZolin  IVPB Commonly known as: ANCEF Inject 2 g into the vein every 8 (eight) hours. Indication:  Sacrococcygeal osteomyelitis First Dose: Yes Last Day of Therapy:  08/06/2020 Labs - Once weekly:  CBC/D and BMP, Labs - Every other week:  ESR and CRP Method of administration: IV Push Method of administration may be changed at the discretion of home infusion pharmacist based upon assessment of the patient  and/or caregiver's ability to self-administer the medication ordered.   cetirizine 10 MG tablet Commonly known as: ZYRTEC Take 10 mg by mouth daily as needed for allergies.   Comfort Shield Adult Diapers Misc Dispense 5 boxes/mo. Use 4-6 times daily for fecal incontinence and diarrhea.   RA Adult Wipes Misc Please dispense 4 boxes/wipes per month.   Underpads Medium Misc Dispense 3 boxes/mo for fecal and urinary incontinence and diarrhea.   diclofenac Sodium 1 % Gel Commonly known as: VOLTAREN Apply 2 g topically 4 (four) times daily. To both knees   Disposable Gloves Misc Use gloves for diaper changes.Dispense 2 boxes   Ensure Plus Liqd Take 237 mLs by mouth 2 (two) times daily between meals.   nutrition supplement (JUVEN) Pack Take 1 packet by mouth 2 (two) times daily between meals. To aid in wound  healing.   feeding supplement (PRO-STAT SUGAR FREE 64) Liqd Take 30 mLs by mouth 2 (two) times daily. To aid in wound healing; resident prefers Prostat mixed in water.   FIBER ADULT GUMMIES PO Take 1 tablet by mouth in the morning and at bedtime.   finasteride 5 MG tablet Commonly known as: PROSCAR Take 5 mg by mouth daily.   hydrocortisone 2.5 % cream Apply 1 application topically 2 (two) times daily as needed (skin rash).   iron polysaccharides 150 MG capsule Commonly known as: Nu-Iron Take 1 capsule (150 mg total) by mouth daily.   ketoconazole 2 % shampoo Commonly known as: NIZORAL Apply 1 application topically See admin instructions. Use to wash hair and face, alternate by week.   levETIRAcetam 500 MG tablet Commonly known as: Keppra Take 1 tablet (500 mg total) by mouth 2 (two) times daily.   metroNIDAZOLE 500 MG tablet Commonly known as: FLAGYL Take 1 tablet (500 mg total) by mouth every 8 (eight) hours. What changed: when to take this   Misc. Devices Misc Take by mouth.   Misc. Devices Misc Manual wheelchair. 16 inch wide with elevating leg rests, high reclining back and neck support and 16-inch cushion.   multivitamin with minerals tablet Take 1 tablet by mouth daily.   NON FORMULARY Regular liberalized diet d/t Dx Malnutrition.   pravastatin 40 MG tablet Commonly known as: PRAVACHOL Take 1 tablet (40 mg total) by mouth daily.   RA SALINE ENEMA RE If not relieved by Biscodyl suppository, give disposable Saline Enema rectally X 1 dose/24 hrs as needed (Do not use constipation standing orders for residents with renal failure/CFR less than 30. Contact MD for orders)(Physician Or   saccharomyces boulardii 250 MG capsule Commonly known as: FLORASTOR Take 1 capsule (250 mg total) by mouth 2 (two) times daily.   Santyl ointment Generic drug: collagenase Apply 1 application topically daily.   selenium sulfide 1 % Lotn Commonly known as: SELSUN Apply 1  application topically daily.   vitamin B-12 500 MCG tablet Commonly known as: CYANOCOBALAMIN Take 1 tablet (500 mcg total) by mouth daily.   zinc gluconate 50 MG tablet Take 1 tablet (50 mg total) by mouth daily.            Discharge Care Instructions  (From admission, onward)         Start     Ordered   07/06/20 0000  Change dressing on IV access line weekly and PRN  (Home infusion instructions - [facility] )        07/06/20 1043         Allergies  Allergen  Reactions  . Penicillins     Unknown Did it involve swelling of the face/tongue/throat, SOB, or low BP? Unk Did it involve sudden or severe rash/hives, skin peeling, or any reaction on the inside of your mouth or nose? Unk Did you need to seek medical attention at a hospital or doctor's office? Unk When did it last happen?childhood If all above answers are "NO", may proceed with cephalosporin use.      Follow-up Information    Chesley Noon, MD. Call in 1 day(s).   Specialty: Family Medicine Why: Please call for a post hospital follow-up appointment. Contact information: [place] [address] [phone]        Jerline Pain, MD .   Specialty: Cardiology Contact information: [phone] [address] [place] [address] [phone]        Rosiland Oz, MD. Call in 1 day(s).   Specialty: Infectious Diseases Why: Please call for a post hospital follow-up appointment. Contact information: [address] [address] [phone]                The results of significant diagnostics from this hospitalization (including imaging, microbiology, ancillary and laboratory) are listed below for reference.    Significant Diagnostic Studies: CT Chest W Contrast  Result Date: 06/22/2020 CLINICAL DATA:  Tachypnea, sacral decubitus ulcer, fever, sepsis EXAM: CT CHEST, ABDOMEN, AND PELVIS WITH CONTRAST TECHNIQUE:  Multidetector CT imaging of the chest, abdomen and pelvis was performed following the standard protocol during bolus administration of intravenous contrast. CONTRAST:  156m OMNIPAQUE IOHEXOL 300 MG/ML  SOLN COMPARISON:  06/22/2020, 09/16/2019, 02/05/2020 FINDINGS: CT CHEST FINDINGS Cardiovascular: The heart and great vessels are unremarkable without pericardial effusion. Normal caliber of the thoracic aorta. Mediastinum/Nodes: No enlarged mediastinal, hilar, or axillary lymph nodes. Thyroid gland, trachea, and esophagus demonstrate no significant findings. Lungs/Pleura: Dependent right lower lobe consolidation could reflect airspace disease or atelectasis. No effusion or pneumothorax. Mucoid material is seen within the right lower lobe bronchus. No effusion or pneumothorax. Musculoskeletal: No acute or destructive bony lesions. Reconstructed images demonstrate no additional findings. CT ABDOMEN PELVIS FINDINGS Hepatobiliary: No focal liver abnormality is seen. No gallstones, gallbladder wall thickening, or biliary dilatation. Pancreas: Unremarkable. No pancreatic ductal dilatation or surrounding inflammatory changes. Spleen: Normal in size without focal abnormality. Adrenals/Urinary Tract: There is a punctate calcification layering dependently within the bladder. No other urinary tract calculi or signs of obstruction. The kidneys enhance normally and symmetrically. The adrenals are unremarkable. Stomach/Bowel: No bowel obstruction or ileus. No bowel wall thickening or inflammatory change. Vascular/Lymphatic: No significant vascular findings are present. No enlarged abdominal or pelvic lymph nodes. Reproductive: Prostate is unremarkable. Other: No free fluid or free gas. No abdominal wall hernia. Musculoskeletal: Persistent decubitus ulceration overlying the distal sacrum and coccyx. Since the prior exam, erosive changes are seen within the dorsal aspect of the lower sacrum at approximately the S3 and S4 levels. No  periosteal reaction. Bilateral L5 pars defects and grade 2 anterolisthesis of L5 on S1 unchanged. Stable chronic compression deformities involving the superior endplates of the L3 and L4 vertebral bodies. Severe spondylosis at L5/S1 unchanged. Reconstructed images demonstrate no additional findings. IMPRESSION: 1. Dependent right lower lobe consolidation with mucoid material in the right lower lobe bronchus. Findings could reflect mucous plugging, aspiration, or infection. 2. Persistent decubitus ulceration overlying the distal sacrum and coccyx. Since the prior exam, erosive changes within the dorsal aspect of the lower sacrum at  approximately the S3 and S4 levels, consistent with chronic osteomyelitis. 3. Punctate nonobstructing calculus within the bladder lumen. Electronically Signed   By: Randa Ngo M.D.   On: 06/22/2020 21:46   CT ABDOMEN PELVIS W CONTRAST  Result Date: 06/22/2020 CLINICAL DATA:  Tachypnea, sacral decubitus ulcer, fever, sepsis EXAM: CT CHEST, ABDOMEN, AND PELVIS WITH CONTRAST TECHNIQUE: Multidetector CT imaging of the chest, abdomen and pelvis was performed following the standard protocol during bolus administration of intravenous contrast. CONTRAST:  162m OMNIPAQUE IOHEXOL 300 MG/ML  SOLN COMPARISON:  06/22/2020, 09/16/2019, 02/05/2020 FINDINGS: CT CHEST FINDINGS Cardiovascular: The heart and great vessels are unremarkable without pericardial effusion. Normal caliber of the thoracic aorta. Mediastinum/Nodes: No enlarged mediastinal, hilar, or axillary lymph nodes. Thyroid gland, trachea, and esophagus demonstrate no significant findings. Lungs/Pleura: Dependent right lower lobe consolidation could reflect airspace disease or atelectasis. No effusion or pneumothorax. Mucoid material is seen within the right lower lobe bronchus. No effusion or pneumothorax. Musculoskeletal: No acute or destructive bony lesions. Reconstructed images demonstrate no additional findings. CT ABDOMEN PELVIS  FINDINGS Hepatobiliary: No focal liver abnormality is seen. No gallstones, gallbladder wall thickening, or biliary dilatation. Pancreas: Unremarkable. No pancreatic ductal dilatation or surrounding inflammatory changes. Spleen: Normal in size without focal abnormality. Adrenals/Urinary Tract: There is a punctate calcification layering dependently within the bladder. No other urinary tract calculi or signs of obstruction. The kidneys enhance normally and symmetrically. The adrenals are unremarkable. Stomach/Bowel: No bowel obstruction or ileus. No bowel wall thickening or inflammatory change. Vascular/Lymphatic: No significant vascular findings are present. No enlarged abdominal or pelvic lymph nodes. Reproductive: Prostate is unremarkable. Other: No free fluid or free gas. No abdominal wall hernia. Musculoskeletal: Persistent decubitus ulceration overlying the distal sacrum and coccyx. Since the prior exam, erosive changes are seen within the dorsal aspect of the lower sacrum at approximately the S3 and S4 levels. No periosteal reaction. Bilateral L5 pars defects and grade 2 anterolisthesis of L5 on S1 unchanged. Stable chronic compression deformities involving the superior endplates of the L3 and L4 vertebral bodies. Severe spondylosis at L5/S1 unchanged. Reconstructed images demonstrate no additional findings. IMPRESSION: 1. Dependent right lower lobe consolidation with mucoid material in the right lower lobe bronchus. Findings could reflect mucous plugging, aspiration, or infection. 2. Persistent decubitus ulceration overlying the distal sacrum and coccyx. Since the prior exam, erosive changes within the dorsal aspect of the lower sacrum at approximately the S3 and S4 levels, consistent with chronic osteomyelitis. 3. Punctate nonobstructing calculus within the bladder lumen. Electronically Signed   By: MRanda NgoM.D.   On: 06/22/2020 21:46   DG CHEST PORT 1 VIEW  Result Date: 07/02/2020 CLINICAL DATA:   Aspiration pneumonia EXAM: PORTABLE CHEST 1 VIEW COMPARISON:  Two days ago FINDINGS: Indistinct opacity at the lung bases. No visible effusion or pneumothorax. Normal heart size. Left PICC with tip at the upper right atrium. IMPRESSION: Stable infiltrates at the lung bases. Electronically Signed   By: JMonte FantasiaM.D.   On: 07/02/2020 05:27   DG CHEST PORT 1 VIEW  Result Date: 06/30/2020 CLINICAL DATA:  Seizures, syncope, osteomyelitis EXAM: PORTABLE CHEST 1 VIEW COMPARISON:  06/22/2020 FINDINGS: Single frontal view of the chest demonstrates an unremarkable cardiac silhouette. Left-sided PICC tip projects over the atrial caval junction. Persistent patchy consolidation within the medial right lung base. No effusion or pneumothorax. No acute bony abnormalities. IMPRESSION: 1. Persistent right lower lobe consolidation, not appreciably changed since prior CT. Favor mucous plugging/atelectasis over aspiration or infection. Electronically  Signed   By: Randa Ngo M.D.   On: 06/30/2020 17:00   DG Chest Port 1 View  Result Date: 06/22/2020 CLINICAL DATA:  Sacral decubitus ulcer, sepsis, fever EXAM: PORTABLE CHEST 1 VIEW COMPARISON:  02/14/2020 FINDINGS: Single frontal view of the chest demonstrates a stable cardiac silhouette. Increased density at the medial right lung base could reflect atelectasis/scarring or airspace disease. There is slight elevation of the right hemidiaphragm, new since prior study. No effusion or pneumothorax. No acute bony abnormalities. IMPRESSION: 1. Increased density at the medial right lung base, with elevation of the right hemidiaphragm suggesting volume loss and atelectasis or scarring. Superimposed airspace disease cannot be completely excluded in the clinical scenario. Electronically Signed   By: Randa Ngo M.D.   On: 06/22/2020 17:32   DG Swallowing Func-Speech Pathology  Result Date: 06/24/2020 Objective Swallowing Evaluation: Type of Study: MBS-Modified Barium  Swallow Study  Patient Details Name: Tracy Todd MRN: [id number] Date of Birth: 1966-08-08 Today's Date: 06/24/2020 Time: SLP Start Time (ACUTE ONLY): 1305 -SLP Stop Time (ACUTE ONLY): 1335 SLP Time Calculation (min) (ACUTE ONLY): 30 min Past Medical History: Past Medical History: Diagnosis Date . Arthritis  . Down's syndrome  . Headache  . Hyperlipidemia  . Iron deficiency anemia  . Recurrent syncope  . Seizure disorder ([place]) 05/21/2020 . Seizures ([place])  Past Surgical History: No past surgical history on file. HPI: 54yo male admitted 06/22/20 with fever and cough with cxr concerning for aspiration pna. PMH: seizures, recurrent syncope, HLD, iron deficiency anemia, Down Syndrome, nonverbal, bedbound last 7-8 months with steady and chronic decline in function.  Pt underwent BSE with recommendation for MBS due to pt's pna and coughing post-swallow of minimal intake he would accept.  Subjective: pt awake in chair, required repositioning for view Assessment / Plan / Recommendation CHL IP CLINICAL IMPRESSIONS 06/24/2020 Clinical Impression Mild oropharyngeal dysphagia without aspiration or laryngeal penetration to vocal cord with any consistency tested.  Of note, pt did NOT cough during the study = which he is clinically conducting after his intake per notes from BSE.  Pt does present with delayed oral transiting, lingual pumping with foods requiring mastication due to discoordination.  Pharyngeal swallow was marginally weak with decreased tongue base retraction which allows minimal retention of solids without explicit awareness.  Secretions also appeared retained in pharynx and mixed with retained barium.  Recommend pt continue regular/thin diet and medications with pudding - whole.  Advise pt start all intake with liquids and drink liquids during meal.  Sister present during the pt's MBS and was informed to findings/recommendations.  Since positioning is an issue for this pt, advise to consider reverse [place] for  optimal safety with intake.  Will follow up briefly for education/po tolerance.  Advised pt's sister to mitigation strategies for this pt as he is unable to follow directions for strengthening exercises, etc. Reviewed MBS findings with pt's sister including pt's appearance of osteophytes along with deconditioning possibly impacting epiglottic deflection, contributing to dysphagia.  SLP Visit Diagnosis Dysphagia, oropharyngeal phase (R13.12) Attention and concentration deficit following -- Frontal lobe and executive function deficit following -- Impact on safety and function Mild aspiration risk   CHL IP TREATMENT RECOMMENDATION 06/24/2020 Treatment Recommendations Therapy as outlined in treatment plan below   Prognosis 06/24/2020 Prognosis for Safe Diet Advancement Fair Barriers to Reach Goals Cognitive deficits Barriers/Prognosis Comment -- CHL IP DIET RECOMMENDATION 06/24/2020 SLP Diet Recommendations Regular solids;Thin liquid Liquid Administration via Cup;Straw Medication Administration Whole meds with puree Compensations  Minimize environmental distractions;Slow rate;Small sips/bites Postural Changes Seated upright at 90 degrees;Remain semi-upright after after feeds/meals (Comment)   CHL IP OTHER RECOMMENDATIONS 06/24/2020 Recommended Consults -- Oral Care Recommendations Oral care QID Other Recommendations --   CHL IP FOLLOW UP RECOMMENDATIONS 06/24/2020 Follow up Recommendations 24 hour supervision/assistance   CHL IP FREQUENCY AND DURATION 06/24/2020 Speech Therapy Frequency (ACUTE ONLY) -- Treatment Duration 1 week;2 weeks      CHL IP ORAL PHASE 06/24/2020 Oral Phase WFL Oral - Pudding Teaspoon -- Oral - Pudding Cup -- Oral - Honey Teaspoon -- Oral - Honey Cup -- Oral - Nectar Teaspoon -- Oral - Nectar Cup -- Oral - Nectar Straw WFL Oral - Thin Teaspoon WFL Oral - Thin Cup -- Oral - Thin Straw WFL Oral - Puree WFL Oral - Mech Soft Premature spillage;Weak lingual manipulation;Lingual pumping;Reduced posterior  propulsion;Delayed oral transit Oral - Regular -- Oral - Multi-Consistency -- Oral - Pill WFL Oral Phase - Comment --  CHL IP PHARYNGEAL PHASE 06/24/2020 Pharyngeal Phase WFL Pharyngeal- Pudding Teaspoon -- Pharyngeal -- Pharyngeal- Pudding Cup -- Pharyngeal -- Pharyngeal- Honey Teaspoon -- Pharyngeal -- Pharyngeal- Honey Cup -- Pharyngeal -- Pharyngeal- Nectar Teaspoon -- Pharyngeal -- Pharyngeal- Nectar Cup -- Pharyngeal -- Pharyngeal- Nectar Straw WFL Pharyngeal Material does not enter airway Pharyngeal- Thin Teaspoon WFL Pharyngeal Material does not enter airway Pharyngeal- Thin Cup -- Pharyngeal -- Pharyngeal- Thin Straw Penetration/Aspiration during swallow Pharyngeal Material enters airway, remains ABOVE vocal cords then ejected out Pharyngeal- Puree WFL Pharyngeal Material does not enter airway Pharyngeal- Mechanical Soft Delayed swallow initiation-vallecula;Pharyngeal residue - valleculae;Reduced epiglottic inversion;Reduced tongue base retraction Pharyngeal Material does not enter airway Pharyngeal- Regular -- Pharyngeal -- Pharyngeal- Multi-consistency -- Pharyngeal -- Pharyngeal- Pill WFL Pharyngeal Material does not enter airway Pharyngeal Comment --  CHL IP CERVICAL ESOPHAGEAL PHASE 06/24/2020 Cervical Esophageal Phase WFL Pudding Teaspoon -- Pudding Cup -- Honey Teaspoon -- Honey Cup -- Nectar Teaspoon -- Nectar Cup -- Nectar Straw -- Thin Teaspoon -- Thin Cup -- Thin Straw -- Puree -- Mechanical Soft -- Regular -- Multi-consistency -- Pill -- Cervical Esophageal Comment WFL Kathleen Lime, MS [facility] SLP Acute Rehab Services Office [phone] Pager [phone] [name] 06/24/2020, 2:04 PM              [facility] PLACEMENT LEFT >5 YRS INC IMG GUIDE  Result Date: 06/25/2020 INDICATION: A 54 year old male with down syndrome and acute hematogenous osteomyelitis. He requires PICC line placement for outpatient IV antibiotic therapy. EXAM: PICC LINE PLACEMENT WITH ULTRASOUND AND FLUOROSCOPIC GUIDANCE  MEDICATIONS: None. ANESTHESIA/SEDATION: 1 mg Versed given for anxiolysis. FLUOROSCOPY TIME:  Fluoroscopy Time: 0 minutes 18 seconds (1 mGy). COMPLICATIONS: None immediate. PROCEDURE: The patient was advised of the possible risks and complications and agreed to undergo the procedure. The patient was then brought to the angiographic suite for the procedure. The right/left arm was prepped with chlorhexidine, draped in the usual sterile fashion using maximum barrier technique (cap and mask, sterile gown, sterile gloves, large sterile sheet, hand hygiene and cutaneous antisepsis) and infiltrated locally with 1% Lidocaine. Ultrasound demonstrated patency of the right basilic vein, and this was documented with an image. Under real-time ultrasound guidance, this vein was accessed with a 21 gauge micropuncture needle and image documentation was performed. A 0.018 wire was introduced in to the vein. Over this, a 5 [place] single lumen power-injectable PICC was advanced to the lower SVC/right atrial junction. Fluoroscopy during the procedure and fluoro spot radiograph confirms appropriate catheter position. The catheter  was flushed and covered with a sterile dressing. Catheter length: 39 cm IMPRESSION: Successful right arm Power PICC line placement with ultrasound and fluoroscopic guidance. The catheter is ready for use. Electronically Signed   By: Jacqulynn Cadet M.D.   On: 06/25/2020 16:07   [place] EKG SITE RITE  Result Date: 07/01/2020 If Site Rite image not attached, placement could not be confirmed due to current cardiac rhythm.   Microbiology: Recent Results (from the past 240 hour(s))  Culture, blood (routine x 2)     Status: None   Collection Time: 06/30/20  6:02 PM   Specimen: BLOOD LEFT HAND  Result Value Ref Range Status   Specimen Description   Final    BLOOD LEFT HAND Performed at [facility], [place] [address] [place], [place] [address]    Special Requests   Final    BOTTLES  DRAWN AEROBIC ONLY Blood Culture results may not be optimal due to an inadequate volume of blood received in culture bottles Performed at [facility] [address], [place], [place] [address]    Culture   Final    NO GROWTH 5 DAYS Performed at [facility], [place] [address], [place], [place] [address]    Report Status 07/05/2020 FINAL  Final  Culture, blood (routine x 2)     Status: None   Collection Time: 06/30/20  6:02 PM   Specimen: BLOOD LEFT FOREARM  Result Value Ref Range Status   Specimen Description   Final    BLOOD LEFT FOREARM Performed at [facility] [address] [place], [place] [address]    Special Requests   Final    BOTTLES DRAWN AEROBIC ONLY Blood Culture adequate volume Performed at [facility] [address] [place], [place] [address]    Culture   Final    NO GROWTH 5 DAYS Performed at [facility], [place] [address], [place], [place] [address]    Report Status 07/05/2020 FINAL  Final     Labs: Basic Metabolic Panel: Recent Labs  Lab 06/30/20 0640 06/30/20 0640 07/01/20 0520 07/01/20 0520 07/02/20 0510 07/03/20 0555 07/04/20 0500 07/05/20 0615 07/06/20 0705  NA 136   < > 135   < > 136 141 139 139 139  K 3.5   < > 3.8   < > 3.4* 4.0 3.9 3.4* 4.1  CL 100   < > 105   < > 103 112* 106 103 104  CO2 30   < > 27   < > _0 GLUCOSE 109*   < > 114*   < > 105* 97 95 106* 125*  BUN 15   < > 16   < > _1 CREATININE 0.55*   < > 0.39*   < > 0.43* 0.41* 0.43* 0.58* 0.44*  CALCIUM 7.7*   < > 7.5*   < > 7.6* 7.0* 7.4* 7.9* 7.8*  MG 2.0  --  2.0  --   --  1.7 1.8  --  2.0  PHOS  --   --   --   --   --  2.9  --   --   --    < > = values in this interval not displayed.   Liver Function Tests: Recent Labs  Lab 06/30/20 0640 07/01/20 0520 07/04/20 0500 07/05/20 0615  AST _2 ALT _3 10  ALKPHOS 52 47 44 47  BILITOT 0.4 0.3 0.2* 0.2*  PROT 5.2* 5.2*  5.1* 5.1*  ALBUMIN 2.0* 2.4* 1.9* 2.1*   No results for input(s): LIPASE, AMYLASE in the last 168 hours. No results for input(s): AMMONIA in the last 168 hours. CBC: Recent Labs  Lab 07/01/20 0520 07/02/20 0510  WBC 3.3* 4.1  HGB 9.9* 10.0*  HCT 32.9* 33.5*  MCV 103.1* 103.1*  PLT 236 253   Cardiac Enzymes: No results for input(s): CKTOTAL, CKMB, CKMBINDEX, TROPONINI in the last 168 hours. BNP: BNP (last 3 results) No results for input(s): BNP in the last 8760 hours.  ProBNP (last 3 results) No results for input(s): PROBNP in the last 8760 hours.  CBG: Recent Labs  Lab 07/05/20 1723  GLUCAP 124*       Signed:  Kayleen Memos, MD [facility] 07/06/2020, 1:48 PM

## 2020-07-06 NOTE — Discharge Instructions (Signed)
Community-Acquired Pneumonia, Adult Pneumonia is an infection of the lungs. It causes swelling in the airways of the lungs. Mucus and fluid may also build up inside the airways. One type of pneumonia can happen while a person is in a hospital. A different type can happen when a person is not in a hospital (community-acquired pneumonia).  What are the causes?  This condition is caused by germs (viruses, bacteria, or fungi). Some types of germs can be passed from one person to another. This can happen when you breathe in droplets from the cough or sneeze of an infected person. What increases the risk? You are more likely to develop this condition if you:  Have a long-term (chronic) disease, such as: ? Chronic obstructive pulmonary disease (COPD). ? Asthma. ? Cystic fibrosis. ? Congestive heart failure. ? Diabetes. ? Kidney disease.  Have HIV.  Have sickle cell disease.  Have had your spleen removed.  Do not take good care of your teeth and mouth (poor dental hygiene).  Have a medical condition that increases the risk of breathing in droplets from your own mouth and nose.  Have a weakened body defense system (immune system).  Are a smoker.  Travel to areas where the germs that cause this illness are common.  Are around certain animals or the places they live. What are the signs or symptoms?  A dry cough.  A wet (productive) cough.  Fever.  Sweating.  Chest pain. This often happens when breathing deeply or coughing.  Fast breathing or trouble breathing.  Shortness of breath.  Shaking chills.  Feeling tired (fatigue).  Muscle aches. How is this treated? Treatment for this condition depends on many things. Most adults can be treated at home. In some cases, treatment must happen in a hospital. Treatment may include:  Medicines given by mouth or through an IV tube.  Being given extra oxygen.  Respiratory therapy. In rare cases, treatment for very bad pneumonia  may include:  Using a machine to help you breathe.  Having a procedure to remove fluid from around your lungs. Follow these instructions at home: Medicines  Take over-the-counter and prescription medicines only as told by your doctor. ? Only take cough medicine if you are losing sleep.  If you were prescribed an antibiotic medicine, take it as told by your doctor. Do not stop taking the antibiotic even if you start to feel better. General instructions   Sleep with your head and neck raised (elevated). You can do this by sleeping in a recliner or by putting a few pillows under your head.  Rest as needed. Get at least 8 hours of sleep each night.  Drink enough water to keep your pee (urine) pale yellow.  Eat a healthy diet that includes plenty of vegetables, fruits, whole grains, low-fat dairy products, and lean protein.  Do not use any products that contain nicotine or tobacco. These include cigarettes, e-cigarettes, and chewing tobacco. If you need help quitting, ask your doctor.  Keep all follow-up visits as told by your doctor. This is important. How is this prevented? A shot (vaccine) can help prevent pneumonia. Shots are often suggested for:  People older than 55 years of age.  People older than 55 years of age who: ? Are having cancer treatment. ? Have long-term (chronic) lung disease. ? Have problems with their body's defense system. You may also prevent pneumonia if you take these actions:  Get the flu (influenza) shot every year.  Go to the dentist as   often as told.  Wash your hands often. If you cannot use soap and water, use hand sanitizer. Contact a doctor if:  You have a fever.  You lose sleep because your cough medicine does not help. Get help right away if:  You are short of breath and it gets worse.  You have more chest pain.  Your sickness gets worse. This is very serious if: ? You are an older adult. ? Your body's defense system is weak.  You  cough up blood. Summary  Pneumonia is an infection of the lungs.  Most adults can be treated at home. Some will need treatment in a hospital.  Drink enough water to keep your pee pale yellow.  Get at least 8 hours of sleep each night. This information is not intended to replace advice given to you by your health care provider. Make sure you discuss any questions you have with your health care provider. Document Revised: 11/28/2018 Document Reviewed: 04/05/2018 Elsevier Patient Education  2020 [employer]   Hypernatremia Hypernatremia is when your blood has too much salt (sodium) in it and not enough water. This is an electrolyte disorder. Electrolytes are minerals found in the body's blood and tissues. Salt is one of these minerals. Electrolyte disorders occur when the level of minerals is either too high or too low in the body. Water and salt must be balanced in the blood. When the level of salt is too high, this is serious and can be an emergency. What are the causes? This condition may be caused by:  Not drinking enough water.  Certain medicines, such as diuretics.  Water loss through exercise or sweating.  Having watery poop (diarrhea).  Vomiting.  Eating too much salt. Other causes include conditions that:  Affect a person's memory.  Cause the kidneys to remove too much pee (urine) from the body. This is often related to diabetes.  Cause the body to make too much of a hormone called cortisol.  Affect the balance of salt and water in the body.  Affect the kidneys. What are the signs or symptoms? Symptoms of this condition may include:  Being more thirsty than usual.  Peeing more than usual.  Muscle twitching or cramps.  Feeling dizzy or light-headed.  Headache.  Feeling tired.  Feeling sick to your stomach (nausea).  Vomiting.  Having watery poop.  Fever.  Weakness.  Getting angry or annoyed easily (irritability). Very bad symptoms of this  condition include:  Confusion.  Passing out.  Seizures.  Coma. How is this treated? This condition may need to be treated right away. In some cases, you may need to stay in the hospital. Treatment may include:  Drinking more water.  Fluids given through an IV tube put into one of your veins.  Changes in the medicines that you are taking.  Keeping you from having a fever.  Diabetes control. Lab tests will be done to make sure treatment is working. Follow these instructions at home:   Drink enough fluid to keep your pee pale yellow.  Take over-the-counter and prescription medicines only as told by your doctor.  Avoid salty processed foods. This includes canned, jarred, frozen, or boxed foods. Some examples include: ? Pickles. ? Frozen dinners. ? Canned soups. ? Potato and corn chips. ? Olives.  Follow any other diet instructions from your doctor.  Always drink fluids after you exercise.  Always drink fluids after you vomit or have watery poop.  Keep all follow-up visits as told  by your doctor. This is important. Contact a doctor if:  You have watery poop.  You vomit.  You have a fever or chills.  You are not able to follow advice from your doctor about drinking fluids. Get help right away if:  You feel light-headed and weak.  You have a fast heartbeat.  You have problems breathing.  You get confused.  You get restless.  You get angry or annoyed easily.  You have a seizure.  You faint.  You have signs of not having enough water in your body (dehydration), such as: ? Dark pee. ? Very little or no pee. ? Cracked lips. ? No tears. ? Dry mouth. ? Sunken eyes. ? Feeling sleepy. Summary  Hypernatremia is when your blood has too much salt in it and not enough water.  You may need to stay in the hospital to be treated.  Treatment may include drinking more water or getting fluids through an IV tube.  Lab tests will be done to make sure  treatment is working.  Drink enough fluid to keep your pee pale yellow. This information is not intended to replace advice given to you by your health care provider. Make sure you discuss any questions you have with your health care provider. Document Revised: 07/26/2018 Document Reviewed: 07/26/2018 Elsevier Patient Education  2020 [employer]  Osteomyelitis, Adult  Bone infections (osteomyelitis) occur when bacteria or other germs get inside a bone. This can happen if you have an infection in another part of your body that spreads through your blood. Germs from your skin or from outside of your body can also cause this type of infection if you have a wound or a broken bone (fracture) that breaks the skin. Bone infections need to be treated quickly to prevent bone damage and to prevent the infection from spreading to other areas of your body. What are the causes? Most bone infections are caused by bacteria. They can also be caused by other germs, such as viruses and funguses. What increases the risk? You are more likely to develop this condition if you:  Recently had surgery, especially bone or joint surgery.  Have a long-term (chronic) disease, such as: ? Diabetes. ? HIV (human immunodeficiency virus). ? Rheumatoid arthritis. ? Sickle cell anemia. ? Kidney disease that requires dialysis.  Are aged 55 years or older.  Have a condition or take medicines that block or weaken your body's defense system (immune system).  Have a condition that reduces your blood flow.  Have an artificial joint.  Have had a joint or bone repaired with plates or screws (surgical hardware).  Use IV drugs.  Have a central line for IV access.  Have had trauma, such as stepping on a nail or a broken bone that came through the skin. What are the signs or symptoms? Symptoms vary depending on the type and location of your infection. Common symptoms of bone infections include:  Fever and  chills.  Skin redness and warmth.  Swelling.  Pain and stiffness.  Drainage of fluid or pus near the infection. How is this diagnosed? This condition may be diagnosed based on:  Your symptoms and medical history.  A physical exam.  Tests, such as: ? A sample of tissue, fluid, or blood taken to be examined under a microscope. ? Pus or discharge swabbed from a wound for testing to identify germs and to determine what type of medicine will kill them (culture and sensitivity). ? Blood tests.  Imaging studies.  These may include: ? X-rays. ? MRI. ? CT scan. ? Bone scan. ? Ultrasound. How is this treated? Treatment for this condition depends on the cause and type of infection. Antibiotic medicines are usually the first treatment for a bone infection. This may be done in a hospital at first. You may have to continue IV antibiotics at home or take antibiotics by mouth for several weeks after that. Other treatments may include surgery to remove:  Dead or dying tissue from a bone.  An infected artificial joint.  Infected plates or screws that were used to repair a broken bone. Follow these instructions at home: Medicines   Take over-the-counter and prescription medicines only as told by your health care provider.  Take your antibiotic medicine as told by your health care provider. Do not stop taking the antibiotic even if you start to feel better.  Follow instructions from your health care provider about how to take IV antibiotics at home. You may need to have a nurse come to your home to give you the IV antibiotics. General instructions   Ask your health care provider if you have any restrictions on your activities.  If directed, put ice on the affected area: ? Put ice in a plastic bag. ? Place a towel between your skin and the bag. ? Leave the ice on for 20 minutes, 2-3 times a day.  Wash your hands often with soap and water. If soap and water are not available, use hand  sanitizer.  Do not use any products that contain nicotine or tobacco, such as cigarettes and e-cigarettes. These can delay bone healing. If you need help quitting, ask your health care provider.  Keep all follow-up visits as told by your health care provider. This is important. Contact a health care provider if:  You develop a fever or chills.  You have redness, warmth, pain, or swelling that returns after treatment. Get help right away if:  You have rapid breathing or you have trouble breathing.  You have chest pain.  You cannot drink fluids or make urine.  The affected area swells, changes color, or turns blue.  You have numbness or severe pain in the affected area. Summary  Bone infections (osteomyelitis) occur when bacteria or other germs get inside a bone.  You may be more likely to get this type of infection if you have a condition, such as diabetes, that lowers your ability to fight infection or increases your chances of getting an infection.  Most bone infections are caused by bacteria. They can also be caused by other germs, such as viruses and funguses.  Treatment for this condition usually starts with taking antibiotics. Further treatment depends on the cause and type of infection. This information is not intended to replace advice given to you by your health care provider. Make sure you discuss any questions you have with your health care provider. Document Revised: 08/24/2017 Document Reviewed: 08/17/2017 Elsevier Patient Education  2020 [employer].

## 2020-07-07 DIAGNOSIS — U071 COVID-19: Secondary | ICD-10-CM | POA: Diagnosis not present

## 2020-07-07 DIAGNOSIS — L89304 Pressure ulcer of unspecified buttock, stage 4: Secondary | ICD-10-CM | POA: Diagnosis not present

## 2020-07-07 DIAGNOSIS — A419 Sepsis, unspecified organism: Secondary | ICD-10-CM | POA: Diagnosis not present

## 2020-07-07 LAB — CBC WITH DIFFERENTIAL/PLATELET
Abs Immature Granulocytes: 0.02 10*3/uL (ref 0.00–0.07)
Basophils Absolute: 0 10*3/uL (ref 0.0–0.1)
Basophils Relative: 0 %
Eosinophils Absolute: 0 10*3/uL (ref 0.0–0.5)
Eosinophils Relative: 0 %
HCT: 29.7 % — ABNORMAL LOW (ref 39.0–52.0)
Hemoglobin: 8.8 g/dL — ABNORMAL LOW (ref 13.0–17.0)
Immature Granulocytes: 1 %
Lymphocytes Relative: 15 %
Lymphs Abs: 0.5 10*3/uL — ABNORMAL LOW (ref 0.7–4.0)
MCH: 30.8 pg (ref 26.0–34.0)
MCHC: 29.6 g/dL — ABNORMAL LOW (ref 30.0–36.0)
MCV: 103.8 fL — ABNORMAL HIGH (ref 80.0–100.0)
Monocytes Absolute: 0.2 10*3/uL (ref 0.1–1.0)
Monocytes Relative: 7 %
Neutro Abs: 2.6 10*3/uL (ref 1.7–7.7)
Neutrophils Relative %: 77 %
Platelets: 236 10*3/uL (ref 150–400)
RBC: 2.86 MIL/uL — ABNORMAL LOW (ref 4.22–5.81)
RDW: 17.5 % — ABNORMAL HIGH (ref 11.5–15.5)
WBC: 3.4 10*3/uL — ABNORMAL LOW (ref 4.0–10.5)
nRBC: 0 % (ref 0.0–0.2)

## 2020-07-07 LAB — COMPREHENSIVE METABOLIC PANEL
ALT: 6 U/L (ref 0–44)
AST: 16 U/L (ref 15–41)
Albumin: 2.1 g/dL — ABNORMAL LOW (ref 3.5–5.0)
Alkaline Phosphatase: 45 U/L (ref 38–126)
Anion gap: 7 (ref 5–15)
BUN: 16 mg/dL (ref 6–20)
CO2: 28 mmol/L (ref 22–32)
Calcium: 7.8 mg/dL — ABNORMAL LOW (ref 8.9–10.3)
Chloride: 104 mmol/L (ref 98–111)
Creatinine, Ser: 0.37 mg/dL — ABNORMAL LOW (ref 0.61–1.24)
GFR, Estimated: >60 mL/min (ref >60)
Glucose, Bld: 95 mg/dL (ref 70–99)
Potassium: 3.5 mmol/L (ref 3.5–5.1)
Sodium: 139 mmol/L (ref 135–145)
Total Bilirubin: 0.3 mg/dL (ref 0.3–1.2)
Total Protein: 5.5 g/dL — ABNORMAL LOW (ref 6.5–8.1)

## 2020-07-07 LAB — FIBRINOGEN: Fibrinogen: 534 mg/dL — ABNORMAL HIGH (ref 210–475)

## 2020-07-07 LAB — D-DIMER, QUANTITATIVE: D-Dimer, Quant: 2.21 ug/mL-FEU — ABNORMAL HIGH (ref 0.00–0.50)

## 2020-07-07 LAB — FERRITIN: Ferritin: 121 ng/mL (ref 24–336)

## 2020-07-07 LAB — C-REACTIVE PROTEIN: CRP: 1.2 mg/dL — ABNORMAL HIGH (ref <1.0)

## 2020-07-07 LAB — LACTATE DEHYDROGENASE: LDH: 138 U/L (ref 98–192)

## 2020-07-07 MED ORDER — METOPROLOL TARTRATE 12.5 MG HALF TABLET
12.5000 mg | ORAL_TABLET | Freq: Two times a day (BID) | ORAL | Status: DC
  Administered 2020-07-08 – 2020-07-19 (×20): 12.5 mg via ORAL
  Filled 2020-07-07 (×22): qty 1

## 2020-07-07 NOTE — Progress Notes (Signed)
Nutrition Follow-up  DOCUMENTATION CODES:   Underweight  INTERVENTION:   -Ensure Enlive po BID, each supplement provides 350 kcal and 20 grams of protein -Prostat BID ordered. Can replace withProsource Plus PO BID, each provides 100 kcals and 15g protein -Juven Fruit PunchBID, each serving provides 95kcal and 2.5g of protein (amino acids glutamine and arginine)   NUTRITION DIAGNOSIS:   Increased nutrient needs related to wound healing as evidenced by estimated needs.  Ongoing.  GOAL:   Patient will meet greater than or equal to 90% of their needs  Progressing.  MONITOR:   PO intake, Supplement acceptance, Labs, Weight trends, I & O's, Skin  ASSESSMENT:   54 y.o. male,  history of seizures, recurrent syncope, hyperlipidemia, iron deficiency anemia, Down syndrome, and more who presents to the ER with chief complaint of fever and cough.  Patient is nonverbal and was not able to provide any history whatsoever, but chart review reveals the patient was experiencing cough and shortness of breath.  Patient with down syndrome, nonverbal.  Patient now COVID-19+ as of 11/15.  Currently consuming 25-75% of meals.  Pt did accept Ensure today and has been taking Prostat and Juven with no issue.  Admission weight: 90 lbs. Last recorded weight 11/10: 100 lbs.  Labs reviewed. Medications: Vitamin C, Vitamin D, Niferex, Florastor, Vitamin B-12, Zinc sulfate  Diet Order:   Diet Order            DIET - DYS 1 Room service appropriate? No; Fluid consistency: Thin  Diet effective now                 EDUCATION NEEDS:   No education needs have been identified at this time  Skin:  Skin Assessment: Skin Integrity Issues: Skin Integrity Issues:: Stage III Stage III: left hip, sacrum -chronic nonhealing per WOC note  Last BM:  11/9 -type 3 & 4  Height:   Ht Readings from Last 1 Encounters:  07/01/20 (P) 5' (1.524 m)    Weight:   Wt Readings from Last 1 Encounters:   07/01/20 (P) 45.4 kg    BMI:  Body mass index is 19.53 kg/m (pended).  Estimated Nutritional Needs:   Kcal:  1650-1850  Protein:  75-90g  Fluid:  1.8L/day  Tilda Franco, MS, RD, LDN Inpatient Clinical Dietitian Contact information available via Amion

## 2020-07-07 NOTE — TOC Progression Note (Signed)
Transition of Care [facility]) - Progression Note    Patient Details  Name: Tracy Todd MRN: [id number] Date of Birth: 08-Feb-1966  [facility] CM/SW Contact  Ida Rogue, [place] Phone Number: 07/07/2020, 8:48 AM  Clinical Narrative:   Glennon Mac #[id number] F, good through 09/01/20.  Have initiated bed search to facilities that are receiving COVID + residents. TOC will continue to follow during the course of hospitalization.     Expected Discharge Plan: Skilled Nursing Facility Barriers to Discharge: SNF Pending bed offer  Expected Discharge Plan and Services Expected Discharge Plan: Skilled Nursing Facility       Living arrangements for the past 2 months: Single Family Home Expected Discharge Date: 07/06/20                                     Social Determinants of Health (SDOH) Interventions    Readmission Risk Interventions No flowsheet data found.

## 2020-07-07 NOTE — Progress Notes (Addendum)
PROGRESS NOTE  Tracy Todd  HKV:[id number] DOB: 10-16-1965 DOA: 06/22/2020 PCP: Eartha Inch, MD   Brief Narrative: Tracy Todd is a 54 y.o. 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 CT scan of the chest abdomen pelvis showed right lower lobe consolidation with mucoid material on the right lower lobe bronchus. Decubitus ulceration, erosive changes of the sacrum. Patient was treated as sepsis, likely multifactorial, and admitted to the hospital.  In the evening of 06/30/20 patient was transferred to stepdown unit due to severe hypotension, received IV fluid boluses.  Due to persistent hypotension he was started on midodrine 10 mg 3 times daily on 07/02/2020, held on 07/04/20 AM.  Seen by ID, Dr. Luciana Axe, who recommended continued ancef and po flagyl, plan for 6 weeks, end date 08/05/20. Hypotension had resolved and he was felt stable for discharge to SNF based on PT recommendations. Screening covid PCR in this setting was positive and confirmed (had been negative at admission). Since there were no respiratory symptoms or hypoxia, after discussing with his sister who is HCPOA, monoclonal antibody was administered 11/15. SNF search for covid bed is ongoing.   Assessment & Plan: Active Problems:   Pressure injury of skin   Sepsis (HCC)  Covid-19 infection: Negative at admission, then positive screening test 11/15. Only direct contact was with medical staff and family visitors. No hypoxia or respiratory symptoms. CXR reveals faint left lower haziness on my personal review without patchy opacities. Lymphopenic with very mild inflammatory marker elevation which is trending down from prior in setting of actively treated infections. - Consistent with true positive  testing as it was repeated and cycle time was 30. Pt was vaccinated w/Moderna 09/02/2019 and 09/30/2019.  - s/p monoclonal antibody 07/06/2020 for incidental covid.  - If patient remains asymptomatic/mildly symptomatic, isolation period would be 10 days from positive test.  - Encourage incentive spirometry as we've been doing. - If becomes hypoxemic/meets criteria for admission to hospital, would start remdesivir and steroids.  - Sister planning to be tested 11/17, currently asymptomatic.  - Continue pharmacologic DVT prophylaxis, d-dimer ~2, while admitted, consider continuing for short time at discharge due to bedbound status.  Severe sepsis due to RLL aspiration pneumonia and chronic sacral and left trochanteric osteomyelitis: POA, resolved.  - Sepsis resolved, s/p IVF and midodrine, received cefepime and vancomycin for several days in addition to otherwise getting ancef, flagyl which will continue.  - Blood cultures 11/9 negative.   Acute hypoxic respiratory failure due to RLL pneumonia, atelectasis:  - Resolved.   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  Dysphagia: Evaluated by SLP w/MBSS, moderate aspiration risk. - Continue dysphagia 1 diet, aspiration precautions, feeding assistance.   Loose stools/hypokalemia from GI losses: Benign abd exam.  - Continue probiotic  Subclinical hypothyroidism in the setting of acute illness: TSH 7.729, free T4 1.17. ?if multivitamin at home has biotin interfering with assay.  - Not currently symptomatically hypo or hyperthyroid. - Repeat TSH, T3, T4 in 6 weeks once complete IV antibiotics.   Hypocalcemia: Resolved with replacement when corrected for hypoalbuminemia.   Hypotension: Resolved  Severe protein calorie malnutrition:  - Supplement  protein as able.    Hyperlipidemia:  - Continue statin. LFTs wnl.  Seizure disorder:  - Continue home keppra and seizure precautions.   Trisomy 21   Macrocytic anemia: Folic acid and B12 wnl. No evidence of bleeding.  - Monitor CBC in AM  Hypernatremia: Resolved with isotonic IVF.  - Encourage free water by mouth.  Hypokalemia: Improved.  - Continue to supplement as needed.  PVCs, NSVT: No sustained VT.  - Start low dose metoprolol (hypotension improved) and monitor closely  - Continue regular Mg and K supplementation.   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 3 x 3 cm opened wound with undermining at 3 cm  (Active)  06/23/20 0025  Location: Hip  Location Orientation: Left  Staging: Stage 4 - Full thickness tissue loss with exposed bone, tendon or muscle.  Wound Description (Comments): 3 x 3 cm opened wound with undermining at 3 cm   Present on Admission: Yes     Pressure Injury 06/23/20 Lower;Mid Stage 4 - Full thickness tissue loss with exposed bone, tendon or muscle. pressure ulcer with undermining (Active)  06/23/20 0025  Location:   Location Orientation: Lower;Mid  Staging: Stage 4 - Full thickness tissue loss with exposed bone, tendon or muscle.  Wound Description (Comments): pressure ulcer with undermining  Present on Admission: Yes  - Optimize wound healing by offloading, optimize nutritional status, treat infections.  DVT prophylaxis: Heparin 5k units q8h Code Status: DNR Family Communication: Sister by phone this morning Disposition Plan:  Status is: Inpatient  Remains inpatient appropriate because:Unsafe d/c plan and Inpatient level of care appropriate due to severity of illness  Dispo:  Patient From: Home  Planned Disposition: Skilled Nursing Facility  Expected discharge date: 07/08/20  Medically stable for discharge: No  Consultants:   ID  IR  Procedures:   PICC line  placement  Antimicrobials:  Vancomycin, cefepime  Ancef, flagyl ongoing   Subjective: Nonverbal, no overnight events reported.  Objective: Vitals:   07/06/20 1958 07/06/20 2346 07/07/20 0351 07/07/20 1426  BP: (!) 87/63 101/62 100/64 107/81  Pulse: 73 88 82 87  Resp: 16 17 18 16   Temp: 98.4 F (36.9 C) 98.8 F (37.1 C) 97.8 F (36.6 C) 98.1 F (36.7 C)  TempSrc:    Oral  SpO2: 98% 94% 95% 98%  Weight:      Height:        Intake/Output Summary (Last 24 hours) at 07/07/2020 1452 Last data filed at 07/07/2020 07/09/2020 Gross per 24 hour  Intake 60 ml  Output 850 ml  Net -790 ml   Filed Weights   06/22/20 1713 07/01/20 0000  Weight: 40.8 kg (P) 45.4 kg    Gen: 54 y.o. male in no distress Pulm: Non-labored breathing room air. Clear to auscultation bilaterally.  CV: Regular rate and rhythm. No murmur, rub, or gallop. No JVD, no pitting pedal edema. GI: Abdomen soft, non-tender, non-distended, with normoactive bowel sounds. No organomegaly or masses felt. Ext: Warm, Contractures x4 extremities noted. Skin: No rashes, lesions or ulcers on visualized skin, back exam not performed. Neuro: Alert, not cooperative with full exam  Psych: Calm, makes some eye contact.  Data Reviewed: I have personally reviewed following labs and imaging studies  CBC: Recent Labs  Lab 07/01/20 0520 07/02/20 0510 07/07/20 0410  WBC 3.3* 4.1 3.4*  NEUTROABS  --   --  2.6  HGB 9.9* 10.0* 8.8*  HCT  32.9* 33.5* 29.7*  MCV 103.1* 103.1* 103.8*  PLT 236 253 236   Basic Metabolic Panel: Recent Labs  Lab 07/01/20 0520 07/02/20 0510 07/03/20 0555 07/04/20 0500 07/05/20 0615 07/06/20 0705 07/07/20 0410  NA 135   < > 141 139 139 139 139  K 3.8   < > 4.0 3.9 3.4* 4.1 3.5  CL 105   < > 112* 106 103 104 104  CO2 27   < > 23 24 28 30 28   GLUCOSE 114*   < > 97 95 106* 125* 95  BUN 16   < > 9 11 14 14 16   CREATININE 0.39*   < > 0.41* 0.43* 0.58* 0.44* 0.37*  CALCIUM 7.5*   < > 7.0* 7.4* 7.9*  7.8* 7.8*  MG 2.0  --  1.7 1.8  --  2.0  --   PHOS  --   --  2.9  --   --   --   --    < > = values in this interval not displayed.   GFR: Estimated Creatinine Clearance: 60.2 mL/min (A) (by C-G formula based on SCr of 0.37 mg/dL (L)). Liver Function Tests: Recent Labs  Lab 07/01/20 0520 07/04/20 0500 07/05/20 0615 07/07/20 0410  AST 28 27 23 16   ALT 7 11 10 6   ALKPHOS 47 44 47 45  BILITOT 0.3 0.2* 0.2* 0.3  PROT 5.2* 5.1* 5.1* 5.5*  ALBUMIN 2.4* 1.9* 2.1* 2.1*   No results for input(s): LIPASE, AMYLASE in the last 168 hours. No results for input(s): AMMONIA in the last 168 hours. Coagulation Profile: No results for input(s): INR, PROTIME in the last 168 hours. Cardiac Enzymes: No results for input(s): CKTOTAL, CKMB, CKMBINDEX, TROPONINI in the last 168 hours. BNP (last 3 results) No results for input(s): PROBNP in the last 8760 hours. HbA1C: No results for input(s): HGBA1C in the last 72 hours. CBG: Recent Labs  Lab 07/05/20 1723  GLUCAP 124*   Lipid Profile: No results for input(s): CHOL, HDL, LDLCALC, TRIG, CHOLHDL, LDLDIRECT in the last 72 hours. Thyroid Function Tests: No results for input(s): TSH, T4TOTAL, FREET4, T3FREE, THYROIDAB in the last 72 hours. Anemia Panel: Recent Labs    07/06/20 1630 07/07/20 0410  FERRITIN 117 121   Urine analysis:    Component Value Date/Time   COLORURINE YELLOW 06/22/2020 2224   APPEARANCEUR CLEAR 06/22/2020 2224   LABSPEC 1.035 (H) 06/22/2020 2224   PHURINE 5.0 06/22/2020 2224   GLUCOSEU NEGATIVE 06/22/2020 2224   HGBUR NEGATIVE 06/22/2020 2224   BILIRUBINUR NEGATIVE 06/22/2020 2224   KETONESUR NEGATIVE 06/22/2020 2224   PROTEINUR NEGATIVE 06/22/2020 2224   NITRITE NEGATIVE 06/22/2020 2224   LEUKOCYTESUR TRACE (A) 06/22/2020 2224   Recent Results (from the past 240 hour(s))  Culture, blood (routine x 2)     Status: None   Collection Time: 06/30/20  6:02 PM   Specimen: BLOOD LEFT HAND  Result Value Ref Range  Status   Specimen Description   Final    BLOOD LEFT HAND Performed at [facility], [address] [place], [place] [address]    Special Requests   Final    BOTTLES DRAWN AEROBIC ONLY Blood Culture results may not be optimal due to an inadequate volume of blood received in culture bottles Performed at [facility], [address] [place], [place] [address]    Culture   Final    NO GROWTH 5 DAYS Performed at [facility], [address] [place], [place] [address]    Report Status 07/05/2020 FINAL  Final  Culture, blood (routine x 2)     Status: None   Collection Time: 06/30/20  6:02 PM   Specimen: BLOOD LEFT FOREARM  Result Value Ref Range Status   Specimen Description   Final    BLOOD LEFT FOREARM Performed at [facility], [address], [place], [place] [address]    Special Requests   Final    BOTTLES DRAWN AEROBIC ONLY Blood Culture adequate volume Performed at [facility], [address] [place], [place] [address]    Culture   Final    NO GROWTH 5 DAYS Performed at [facility], [address] [place], [place] [address]    Report Status 07/05/2020 FINAL  Final  Respiratory Panel by RT PCR (Flu A&B, Covid) - Nasopharyngeal Swab     Status: Abnormal   Collection Time: 07/06/20 11:32 AM   Specimen: Nasopharyngeal Swab  Result Value Ref Range Status   SARS Coronavirus 2 by RT PCR POSITIVE (A) NEGATIVE Final    Comment: RESULT CALLED TO, READ BACK BY AND VERIFIED WITH: GIRLEY,K. RN @1414  07/06/20 BILLINGSLEY,L (NOTE) SARS-CoV-2 target nucleic acids are DETECTED.  SARS-CoV-2 RNA is generally detectable in upper respiratory specimens  during the acute phase of infection. Positive results are indicative of the presence of the identified virus, but do not rule out bacterial infection or co-infection with other pathogens not detected by the test. Clinical correlation with patient  history and other diagnostic information is necessary to determine patient infection status. The expected result is Negative.  Fact Sheet for Patients:  07/08/20  Fact Sheet for Healthcare Providers: https://www.moore.com/  This test is not yet approved or cleared by the https://www.young.biz/ FDA and  has been authorized for detection and/or diagnosis of SARS-CoV-2 by FDA under an Emergency Use Authorization (EUA).  This EUA will remain in effect (meaning this test  can be used) for the duration of  the COVID-19 declaration under Section 564(b)(1) of the Act, 21 U.S.C. section 360bbb-3(b)(1), unless the authorization is terminated or revoked sooner.      Influenza A by PCR NEGATIVE NEGATIVE Final   Influenza B by PCR NEGATIVE NEGATIVE Final    Comment: (NOTE) The Xpert Xpress SARS-CoV-2/FLU/RSV assay is intended as an aid in  the diagnosis of influenza from Nasopharyngeal swab specimens and  should not be used as a sole basis for treatment. Nasal washings and  aspirates are unacceptable for Xpert Xpress SARS-CoV-2/FLU/RSV  testing.  Fact Sheet for Patients: [place]  Fact Sheet for Healthcare Providers: https://www.moore.com/  This test is not yet approved or cleared by the https://www.young.biz/ FDA and  has been authorized for detection and/or diagnosis of SARS-CoV-2 by  FDA under an Emergency Use Authorization (EUA). This EUA will remain  in effect (meaning this test can be used) for the duration of the  Covid-19 declaration under Section 564(b)(1) of the Act, 21  U.S.C. section 360bbb-3(b)(1), unless the authorization is  terminated or revoked. Performed at [facility], [address] [place], [place]   SARS Coronavirus 2 by RT PCR (hospital order, performed in [facility] hospital lab) Nasopharyngeal Nasopharyngeal Swab     Status: Abnormal    Collection Time: 07/06/20  3:20 PM   Specimen: Nasopharyngeal Swab  Result Value Ref Range Status   SARS Coronavirus 2 POSITIVE (A) NEGATIVE Final    Comment: CRITICAL RESULT CALLED  TO, READ BACK BY AND VERIFIED WITH: WILLIAMS,B. RN  07/06/20 BILLINGSLEY,L (NOTE) SARS-CoV-2 target nucleic acids are DETECTED  SARS-CoV-2 RNA is generally detectable in upper respiratory specimens  during the acute phase of infection.  Positive results are indicative  of the presence of the identified virus, but do not rule out bacterial infection or co-infection with other pathogens not detected by the test.  Clinical correlation with patient history and  other diagnostic information is necessary to determine patient infection status.  The expected result is negative.  Fact Sheet for Patients:   BoilerBrush.com.cy   Fact Sheet for Healthcare Providers:   https://pope.com/    This test is not yet approved or cleared by the [place] FDA and  has been authorized for detection and/or diagnosis of SARS-CoV-2 by FDA under an Emergency Use Authorization (EUA).  This EUA will remain in effec t (meaning this test can be used) for the duration of  the COVID-19 declaration under Section 564(b)(1) of the Act, 21 U.S.C. section 360-bbb-3(b)(1), unless the authorization is terminated or revoked sooner.  Performed at [facility], [address] [place], [place] [address]       Radiology Studies: DG CHEST PORT 1 VIEW  Result Date: 07/06/2020 CLINICAL DATA:  54 year old male with down syndrome, positive COVID-19. EXAM: PORTABLE CHEST 1 VIEW COMPARISON:  Portable chest 07/02/2020 and earlier. FINDINGS: Portable AP semi upright view at 1437 hours. Left upper extremity PICC line remains in place, tip at the lower SVC level. Increasing patchy and indistinct opacity in the left mid and lower lung. Allowing for portable technique the right  lung remains clear. Mediastinal contours remain normal. No pneumothorax or pleural effusion. No acute osseous abnormality identified. Visible bowel gas pattern is within normal limits. IMPRESSION: Increasing indistinct opacity in the left lung compatible with COVID-19 pneumonia. Right lung relatively spared. Electronically Signed   By: [place] Fleming M.D.   On: 07/06/2020 14:51    Scheduled Meds: . vitamin C  500 mg Oral Daily  . aspirin  81 mg Oral Daily  . Chlorhexidine Gluconate Cloth  6 each Topical Daily  . cholecalciferol  1,000 Units Oral Daily  . feeding supplement  237 mL Oral BID BM  . feeding supplement (PRO-STAT SUGAR FREE 64)  30 mL Oral BID  . finasteride  5 mg Oral Daily  . heparin  5,000 Units Subcutaneous Q8H  . Ipratropium-Albuterol  1 puff Inhalation Q6H  . iron polysaccharides  150 mg Oral Daily  . levETIRAcetam  500 mg Oral BID  . loratadine  10 mg Oral Daily  . mouth rinse  15 mL Mouth Rinse BID  . metroNIDAZOLE  500 mg Oral Q8H  . nutrition supplement (JUVEN)  1 packet Oral BID BM  . pravastatin  40 mg Oral Daily  . saccharomyces boulardii  250 mg Oral BID  . selenium sulfide   Topical Daily  . sodium chloride flush  10-40 mL Intracatheter Q12H  . vitamin B-12  500 mcg Oral Daily  . zinc sulfate  220 mg Oral Daily   Continuous Infusions: . sodium chloride 500 mL (07/07/20 1222)  .  ceFAZolin (ANCEF) IV 2 g (07/07/20 0556)  . famotidine (PEPCID) IV       LOS: 15 days   Time spent: 35 minutes.  Tyrone Nine, MD [facility] [email or web address] 07/07/2020, 2:52 PM

## 2020-07-08 DIAGNOSIS — E870 Hyperosmolality and hypernatremia: Secondary | ICD-10-CM | POA: Diagnosis not present

## 2020-07-08 DIAGNOSIS — M8600 Acute hematogenous osteomyelitis, unspecified site: Secondary | ICD-10-CM | POA: Diagnosis not present

## 2020-07-08 DIAGNOSIS — U071 COVID-19: Secondary | ICD-10-CM | POA: Diagnosis not present

## 2020-07-08 LAB — COMPREHENSIVE METABOLIC PANEL
ALT: 5 U/L (ref 0–44)
AST: 15 U/L (ref 15–41)
Albumin: 2.1 g/dL — ABNORMAL LOW (ref 3.5–5.0)
Alkaline Phosphatase: 46 U/L (ref 38–126)
Anion gap: 4 — ABNORMAL LOW (ref 5–15)
BUN: 18 mg/dL (ref 6–20)
CO2: 29 mmol/L (ref 22–32)
Calcium: 7.7 mg/dL — ABNORMAL LOW (ref 8.9–10.3)
Chloride: 105 mmol/L (ref 98–111)
Creatinine, Ser: 0.44 mg/dL — ABNORMAL LOW (ref 0.61–1.24)
GFR, Estimated: >60 mL/min (ref >60)
Glucose, Bld: 95 mg/dL (ref 70–99)
Potassium: 4 mmol/L (ref 3.5–5.1)
Sodium: 138 mmol/L (ref 135–145)
Total Bilirubin: 0.3 mg/dL (ref 0.3–1.2)
Total Protein: 5.4 g/dL — ABNORMAL LOW (ref 6.5–8.1)

## 2020-07-08 LAB — CBC WITH DIFFERENTIAL/PLATELET
Abs Immature Granulocytes: 0.03 10*3/uL (ref 0.00–0.07)
Basophils Absolute: 0 10*3/uL (ref 0.0–0.1)
Basophils Relative: 0 %
Eosinophils Absolute: 0 10*3/uL (ref 0.0–0.5)
Eosinophils Relative: 0 %
HCT: 31.4 % — ABNORMAL LOW (ref 39.0–52.0)
Hemoglobin: 9.5 g/dL — ABNORMAL LOW (ref 13.0–17.0)
Immature Granulocytes: 1 %
Lymphocytes Relative: 15 %
Lymphs Abs: 0.6 10*3/uL — ABNORMAL LOW (ref 0.7–4.0)
MCH: 31.6 pg (ref 26.0–34.0)
MCHC: 30.3 g/dL (ref 30.0–36.0)
MCV: 104.3 fL — ABNORMAL HIGH (ref 80.0–100.0)
Monocytes Absolute: 0.3 10*3/uL (ref 0.1–1.0)
Monocytes Relative: 8 %
Neutro Abs: 2.8 10*3/uL (ref 1.7–7.7)
Neutrophils Relative %: 76 %
Platelets: 256 10*3/uL (ref 150–400)
RBC: 3.01 MIL/uL — ABNORMAL LOW (ref 4.22–5.81)
RDW: 17.2 % — ABNORMAL HIGH (ref 11.5–15.5)
WBC: 3.7 10*3/uL — ABNORMAL LOW (ref 4.0–10.5)
nRBC: 0 % (ref 0.0–0.2)

## 2020-07-08 LAB — D-DIMER, QUANTITATIVE: D-Dimer, Quant: 2.28 ug{FEU}/mL — ABNORMAL HIGH (ref 0.00–0.50)

## 2020-07-08 LAB — C-REACTIVE PROTEIN: CRP: 1.1 mg/dL — ABNORMAL HIGH

## 2020-07-08 MED ORDER — LIP MEDEX EX OINT
TOPICAL_OINTMENT | CUTANEOUS | Status: AC
  Filled 2020-07-08: qty 7

## 2020-07-08 NOTE — Progress Notes (Signed)
  Speech Language Pathology Treatment: Dysphagia  Patient Details Name: Tracy Todd MRN: [id number] DOB: 10-26-1965 Today's Date: 07/08/2020 Time: 8299-3716 SLP Time Calculation (min) (ACUTE ONLY): 50 min  Assessment / Plan / Recommendation Clinical Impression  Today pt seen to assure po tolerance given concern for recurrent aspiration in hospital.  Note his lunch tray in room untouched. SLP brought oatmeal, ice cream, Ensure, water and used magic cup from pt's tray for his snack.  Repositioned pt in bed by lowering HOB and sliding him up as well as using reverse trendelenburg.    Pt willing to consume intake - delayed multiple swallows continue with pt noted to have post-swallow cough x2 of approx 15 boluses total.  Pt noted to open his mouth when he is ready for next bolus and SLP created new swallow precaution sign indicating importance to allow pt time to swallow and to follow his direction with po for safest intake.  Pt will remain elevated asp risk due to his dysphagia, reliance on others for feeding and difficulty in positioning.   Recommend to continue current diet to maximize efficiency of swallow for nutrition and airway protection.   Of note, pt did NOT cough during MBS on 06/24/2020 when he had been coughing clinically - therefore can not determine if cough is due to penetration/aspiration and clinical following indicated.    Follow up with SLP at next venue of care is warranted to assure swallow precautions are understood/adhered to by staff.    HPI HPI: 54yo male admitted 06/22/20 with fever and cough with cxr concerning for aspiration pna. PMH: seizures, recurrent syncope, HLD, iron deficiency anemia, Down Syndrome, nonverbal, bedbound last 7-8 months with steady and chronic decline in function.  Pt underwent BSE with recommendation for MBS due to pt's pna and coughing post-swallow of minimal intake he would accept.      SLP Plan  Continue with current plan of care        Recommendations  Diet recommendations: Dysphagia 1 (puree);Thin liquid Liquids provided via: Straw (use tsp if coughing excessively with straw) Medication Administration: Whole meds with liquid (CRUSH IF CAPSULES or LARGE) Supervision:  (total assist) Compensations: Minimize environmental distractions;Slow rate;Small sips/bites Postural Changes and/or Swallow Maneuvers: Seated upright 90 degrees                Oral Care Recommendations: Oral care BID Follow up Recommendations: 24 hour supervision/assistance SLP Visit Diagnosis: Dysphagia, oropharyngeal phase (R13.12) Plan: Continue with current plan of care       GO                [name] 07/08/2020, 2:56 PM [name], MS [facility] SLP Acute Rehab Services Office [phone] Pager [phone]

## 2020-07-08 NOTE — TOC Progression Note (Addendum)
Transition of Care [facility]) - Progression Note    Patient Details  Name: Tracy Todd MRN: [id number] Date of Birth: [birth year]  [facility] CM/SW Contact  Ida Rogue, [place] Phone Number: 07/08/2020, 11:20 AM  Clinical Narrative:   Patient's information was sent to local facilities accepting COVID + patients for rehab.  I have reached out to them by phone.  [facility] declined; [facility] and [facility] are reviewing. TOC will continue to follow during the course of hospitalization.  Addendum:  [facility].  I have left a message for Carollee Leitz, sister, to discuss.  Addendum II: Heard back from sister, who wants to check further into [place] before making a decision.  She is frustrated at only one bed offer, while also stating that patient cannot return to his previous residence at an [facility] facility due to COVID positive status. I let her know that he is medically stable, and she agreed to talk again in AM about plan after they have a chance to gather more information.      Expected Discharge Plan: Skilled Nursing Facility Barriers to Discharge: SNF Pending bed offer  Expected Discharge Plan and Services Expected Discharge Plan: Skilled Nursing Facility       Living arrangements for the past 2 months: Single Family Home Expected Discharge Date: 07/06/20                                     Social Determinants of Health (SDOH) Interventions    Readmission Risk Interventions No flowsheet data found.

## 2020-07-08 NOTE — Progress Notes (Signed)
Central tele has called multiple times to report pt is having freq episodes of runs of Vtach 3-7 beat runs. Pt is asleep or resting in bed - will monitor

## 2020-07-08 NOTE — Progress Notes (Signed)
[facility] PROGRESS NOTE    Progress Note  Tracy Todd  HMC:[id number] DOB: 22-Jul-1966 DOA: 06/22/2020 PCP: Eartha Inch, MD     Brief Narrative:   Tracy Todd is an 54 y.o. 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 on IV antibiotics through PICC line complicated by bedbound status and contractures who presents to the ED on 06/22/2020 for fever and tachycardia and cough due to right lower lobe pneumonia, he was started in treatment for sepsis multifactorial from pneumonia and sacral decubitus ulcer on 06/30/2020 patient was transferred to stepdown due to severe hypotension he did require midodrine 3 times a day for short period of time, ID was consulted and started on Ancef and Flagyl for 6 weeks physical therapy evaluated the patient the recommended skilled nursing facility SARS-CoV-2 PCR was positive which have been negative on admission monoclonal antibodies were given 07/06/2020 as he remained asymptomatic.  Assessment/Plan:   COVID-19 virus infection: Tested positive on 07/06/2020 no hypoxia no fever chest x-ray showed faint left lower lobe haziness patient relates no symptoms. Patient was vaccinated with moderate on 07/02/2020 and 09/30/2019. He status post monoclonal antibody on 07/06/2020. He remained asymptomatic.  Severe sepsis due to right lower lobe aspiration pneumonia and possibly sacral left trochanteric osteomyelitis chronic: Now resolved he is status post IV fluids and midodrine.  He was started on IV vancomycin and cefepime now transition to Ancef and Flagyl.  Blood cultures on 06/30/2020 remain negative.  Acute hypoxic respiratory failure due to right lower lobe pneumonia: Now resolved.  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  Dysphagia: Evaluated by speech  they recommended a dysphagia 1 diet.  Loose stools/hypokalemia: Likely due to diarrhea on probiotics now resolved.  Possible sick euthyroid syndrome: Her TSH was 7.4 free T4 1.1, will need to have free T4 and TSH repeated in 6 weeks once antibiotics are completed.  Hypocalcemia: Replete orally now resolved.  Severe protein caloric malnutrition: Continue supplements.  Hyperlipidemia: Continue statins.  Seizure disorder: Continue Keppra.  /Hyponatremia: They resolved with repletion.     Pressure injury of skin   Sepsis (HCC)  RN Pressure Injury Documentation: Pressure Injury 06/23/20 Hip Left Stage 4 - Full thickness tissue loss with exposed bone, tendon or muscle. 3 x 3 cm opened wound with undermining at 3 cm  (Active)  06/23/20 0025  Location: Hip  Location Orientation: Left  Staging: Stage 4 - Full thickness tissue loss with exposed bone, tendon or muscle.  Wound Description (Comments): 3 x 3 cm opened wound with undermining at 3 cm   Present on Admission: Yes     Pressure Injury 06/23/20 Lower;Mid Stage 3 -  Full thickness tissue loss. Subcutaneous fat may be visible but bone, tendon or muscle are NOT exposed. pressure ulcer with undermining (Active)  06/23/20 0025  Location:   Location Orientation: Lower;Mid  Staging: Stage 3 -  Full thickness tissue loss. Subcutaneous fat may be visible but bone, tendon or muscle are NOT exposed.  Wound Description (Comments): pressure ulcer with undermining  Present on Admission: Yes    DVT prophylaxis: lovenox Family Communication:none Status is: Inpatient  Remains inpatient appropriate because:Hemodynamically unstable and Unsafe d/c plan   Dispo:  Patient From: Home  Planned Disposition: Skilled Nursing Facility  Expected discharge date: 07/10/20  Medically stable for discharge: Patient is medically stable for discharge awaiting  skilled nursing facility placement.        Code Status:     Code Status Orders    (From admission, onward)         Start     Ordered   07/01/20 1036  Do not attempt resuscitation (DNR)  Continuous       Question Answer Comment  In the event of cardiac or respiratory ARREST Do not call a "code blue"   In the event of cardiac or respiratory ARREST Do not perform Intubation, CPR, defibrillation or ACLS   In the event of cardiac or respiratory ARREST Use medication by any route, position, wound care, and other measures to relive pain and suffering. May use oxygen, suction and manual treatment of airway obstruction as needed for comfort.   Comments DNR / DNI      07/01/20 1035        Code Status History    Date Active Date Inactive Code Status Order ID Comments User Context   06/23/2020 0019 07/01/2020 1035 Full Code [id number]  Lilyan Gilford, DO Inpatient   02/05/2020 1723 02/14/2020 0054 Full Code [id number]  Pennie Banter, DO ED   02/05/2020 1721 02/05/2020 1723 DNR [id number]  Pennie Banter, DO ED   12/03/2019 1555 12/07/2019 0337 Full Code [id number]  Jerald Kief, MD Inpatient   11/29/2019 1511 12/03/2019 1554 DNR [id number]  Jonah Blue, MD ED   09/16/2019 1637 09/17/2019 1800 Full Code [id number]  Burna Cash, MD ED   Advance Care Planning Activity    Advance Directive Documentation     Most Recent Value  Type of Advance Directive Healthcare Power of Attorney  Pre-existing out of facility DNR order (yellow form or pink MOST form) --  "MOST" Form in Place? --        IV Access:    Peripheral IV   Procedures and diagnostic studies:   DG CHEST PORT 1 VIEW  Result Date: 07/06/2020 CLINICAL DATA:  54 year old male with down syndrome, positive COVID-19. EXAM: PORTABLE CHEST 1 VIEW COMPARISON:  Portable chest 07/02/2020 and earlier. FINDINGS: Portable AP semi upright view at 1437 hours. Left upper extremity PICC line remains in place, tip at the lower SVC level. Increasing patchy and indistinct opacity in the left mid and lower lung.  Allowing for portable technique the right lung remains clear. Mediastinal contours remain normal. No pneumothorax or pleural effusion. No acute osseous abnormality identified. Visible bowel gas pattern is within normal limits. IMPRESSION: Increasing indistinct opacity in the left lung compatible with COVID-19 pneumonia. Right lung relatively spared. Electronically Signed   By: Odessa Fleming M.D.   On: 07/06/2020 14:51     Medical Consultants:    None.  Anti-Infectives:   Ancef and flagyl  Subjective:    Tracy Todd nonverbal this morning  Objective:    Vitals:   07/07/20 1426 07/07/20 1528 07/07/20 2016 07/08/20 0339  BP: 107/81  106/67 101/70  Pulse: 87 (!) 109 87 71  Resp: Temp: 98.1 F (36.7 C) 98.2 F (36.8 C) 97.7 F (36.5 C) 97.9 F (36.6 C)  TempSrc: Oral Oral Axillary Oral  SpO2: 98%  97% 96%  Weight:      Height:       SpO2: 96 % O2 Flow Rate (L/min): 2 L/min   Intake/Output Summary (Last 24 hours) at 07/08/2020 1247 Last data filed at 07/08/2020 0900 Gross per 24 hour  Intake 416 ml  Output 900 ml  Net -  484 ml   Filed Weights   06/22/20 1713 07/01/20 0000  Weight: 40.8 kg (P) 45.4 kg    Exam: General exam: In no acute distress. Respiratory system: Good air movement and clear to auscultation. Cardiovascular system: S1 & S2 heard, RRR. No JVD. Gastrointestinal system: Abdomen is nondistended, soft and nontender.  Extremities: No pedal edema. Skin: No rashes, lesions or ulcers   Data Reviewed:    Labs: Basic Metabolic Panel: Recent Labs  Lab 07/03/20 0555 07/03/20 0555 07/04/20 0500 07/04/20 0500 07/05/20 0615 07/05/20 0615 07/06/20 0705 07/06/20 0705 07/07/20 0410 07/08/20 0500  NA 141   < > 139  --  139  --  139  --  139 138  K 4.0   < > 3.9   < > 3.4*   < > 4.1   < > 3.5 4.0  CL 112*   < > 106  --  103  --  104  --  104 105  CO2 23   < > 24  --  28  --  30  --  28 29  GLUCOSE 97   < > 95  --  106*  --  125*  --  95 95    BUN 9   < > 11  --  14  --  14  --  16 18  CREATININE 0.41*   < > 0.43*  --  0.58*  --  0.44*  --  0.37* 0.44*  CALCIUM 7.0*   < > 7.4*  --  7.9*  --  7.8*  --  7.8* 7.7*  MG 1.7  --  1.8  --   --   --  2.0  --   --   --   PHOS 2.9  --   --   --   --   --   --   --   --   --    < > = values in this interval not displayed.   GFR Estimated Creatinine Clearance: 60.2 mL/min (A) (by C-G formula based on SCr of 0.44 mg/dL (L)). Liver Function Tests: Recent Labs  Lab 07/04/20 0500 07/05/20 0615 07/07/20 0410 07/08/20 0500  AST 27 23 16 15   ALT 11 10 6 5   ALKPHOS 44 47 45 46  BILITOT 0.2* 0.2* 0.3 0.3  PROT 5.1* 5.1* 5.5* 5.4*  ALBUMIN 1.9* 2.1* 2.1* 2.1*   No results for input(s): LIPASE, AMYLASE in the last 168 hours. No results for input(s): AMMONIA in the last 168 hours. Coagulation profile No results for input(s): INR, PROTIME in the last 168 hours. COVID-19 Labs  Recent Labs    07/06/20 1630 07/07/20 0410 07/08/20 0500  DDIMER 2.03* 2.21* 2.28*  FERRITIN 117 121  --   LDH 145 138  --   CRP  --  1.2* 1.1*    Lab Results  Component Value Date   SARSCOV2NAA POSITIVE (A) 07/06/2020   SARSCOV2NAA POSITIVE (A) 07/06/2020   SARSCOV2NAA NEGATIVE 06/22/2020   SARSCOV2NAA NEGATIVE 02/12/2020    CBC: Recent Labs  Lab 07/02/20 0510 07/07/20 0410 07/08/20 0500  WBC 4.1 3.4* 3.7*  NEUTROABS  --  2.6 2.8  HGB 10.0* 8.8* 9.5*  HCT 33.5* 29.7* 31.4*  MCV 103.1* 103.8* 104.3*  PLT 253 236 256   Cardiac Enzymes: No results for input(s): CKTOTAL, CKMB, CKMBINDEX, TROPONINI in the last 168 hours. BNP (last 3 results) No results for input(s): PROBNP in the last 8760 hours. CBG: Recent Labs  Lab  07/05/20 1723  GLUCAP 124*   D-Dimer: Recent Labs    07/07/20 0410 07/08/20 0500  DDIMER 2.21* 2.28*   Hgb A1c: No results for input(s): HGBA1C in the last 72 hours. Lipid Profile: No results for input(s): CHOL, HDL, LDLCALC, TRIG, CHOLHDL, LDLDIRECT in the last 72  hours. Thyroid function studies: No results for input(s): TSH, T4TOTAL, T3FREE, THYROIDAB in the last 72 hours.  Invalid input(s): FREET3 Anemia work up: Recent Labs    07/06/20 1630 07/07/20 0410  FERRITIN 117 121   Sepsis Labs: Recent Labs  Lab 07/02/20 0510 07/03/20 0555 07/07/20 0410 07/08/20 0500  PROCALCITON 0.35 0.20  --   --   WBC 4.1  --  3.4* 3.7*   Microbiology Recent Results (from the past 240 hour(s))  Culture, blood (routine x 2)     Status: None   Collection Time: 06/30/20  6:02 PM   Specimen: BLOOD LEFT HAND  Result Value Ref Range Status   Specimen Description   Final    BLOOD LEFT HAND Performed at [facility], [address] [place], [place] [address]    Special Requests   Final    BOTTLES DRAWN AEROBIC ONLY Blood Culture results may not be optimal due to an inadequate volume of blood received in culture bottles Performed at [facility], [address] [place], [place] [address]    Culture   Final    NO GROWTH 5 DAYS Performed at [facility], [address] [place], [place] [address]    Report Status 07/05/2020 FINAL  Final  Culture, blood (routine x 2)     Status: None   Collection Time: 06/30/20  6:02 PM   Specimen: BLOOD LEFT FOREARM  Result Value Ref Range Status   Specimen Description   Final    BLOOD LEFT FOREARM Performed at [facility], [address] [place], [place] [address]    Special Requests   Final    BOTTLES DRAWN AEROBIC ONLY Blood Culture adequate volume Performed at [facility], [address] [place], [place] [address]    Culture   Final    NO GROWTH 5 DAYS Performed at [facility], [address] [place], [place] [address]    Report Status 07/05/2020 FINAL  Final  Respiratory Panel by RT PCR (Flu A&B, Covid) - Nasopharyngeal Swab     Status: Abnormal   Collection Time: 07/06/20 11:32 AM   Specimen: Nasopharyngeal Swab    Result Value Ref Range Status   SARS Coronavirus 2 by RT PCR POSITIVE (A) NEGATIVE Final    Comment: RESULT CALLED TO, READ BACK BY AND VERIFIED WITH: GIRLEY,K. RN @1414  07/06/20 BILLINGSLEY,L (NOTE) SARS-CoV-2 target nucleic acids are DETECTED.  SARS-CoV-2 RNA is generally detectable in upper respiratory specimens  during the acute phase of infection. Positive results are indicative of the presence of the identified virus, but do not rule out bacterial infection or co-infection with other pathogens not detected by the test. Clinical correlation with patient history and other diagnostic information is necessary to determine patient infection status. The expected result is Negative.  Fact Sheet for Patients:  https://www.moore.com/https://www.fda.gov/media/142436/download  Fact Sheet for Healthcare Providers: https://www.young.biz/https://www.fda.gov/media/142435/download  This test is not yet approved or cleared by the [place] FDA and  has been authorized for detection and/or diagnosis of SARS-CoV-2 by FDA under an Emergency Use Authorization (EUA).  This EUA will remain in effect (meaning this test  can be  used) for the duration of  the COVID-19 declaration under Section 564(b)(1) of the Act, 21 U.S.C. section 360bbb-3(b)(1), unless the authorization is terminated or revoked sooner.      Influenza A by PCR NEGATIVE NEGATIVE Final   Influenza B by PCR NEGATIVE NEGATIVE Final    Comment: (NOTE) The Xpert Xpress SARS-CoV-2/FLU/RSV assay is intended as an aid in  the diagnosis of influenza from Nasopharyngeal swab specimens and  should not be used as a sole basis for treatment. Nasal washings and  aspirates are unacceptable for Xpert Xpress SARS-CoV-2/FLU/RSV  testing.  Fact Sheet for Patients: https://www.moore.com/  Fact Sheet for Healthcare Providers: https://www.young.biz/  This test is not yet approved or cleared by the [place] FDA and  has been authorized for  detection and/or diagnosis of SARS-CoV-2 by  FDA under an Emergency Use Authorization (EUA). This EUA will remain  in effect (meaning this test can be used) for the duration of the  Covid-19 declaration under Section 564(b)(1) of the Act, 21  U.S.C. section 360bbb-3(b)(1), unless the authorization is  terminated or revoked. Performed at [facility], [address], [place], [place] [address]   SARS Coronavirus 2 by RT PCR (hospital order, performed in [facility] hospital lab) Nasopharyngeal Nasopharyngeal Swab     Status: Abnormal   Collection Time: 07/06/20  3:20 PM   Specimen: Nasopharyngeal Swab  Result Value Ref Range Status   SARS Coronavirus 2 POSITIVE (A) NEGATIVE Final    Comment: CRITICAL RESULT CALLED TO, READ BACK BY AND VERIFIED WITH: WILLIAMS,B. RN  07/06/20 BILLINGSLEY,L (NOTE) SARS-CoV-2 target nucleic acids are DETECTED  SARS-CoV-2 RNA is generally detectable in upper respiratory specimens  during the acute phase of infection.  Positive results are indicative  of the presence of the identified virus, but do not rule out bacterial infection or co-infection with other pathogens not detected by the test.  Clinical correlation with patient history and  other diagnostic information is necessary to determine patient infection status.  The expected result is negative.  Fact Sheet for Patients:   BoilerBrush.com.cy   Fact Sheet for Healthcare Providers:   https://pope.com/    This test is not yet approved or cleared by the [place] FDA and  has been authorized for detection and/or diagnosis of SARS-CoV-2 by FDA under an Emergency Use Authorization (EUA).  This EUA will remain in effec t (meaning this test can be used) for the duration of  the COVID-19 declaration under Section 564(b)(1) of the Act, 21 U.S.C. section 360-bbb-3(b)(1), unless the authorization is terminated or revoked  sooner.  Performed at [facility], [address] [place], [place] [address]      Medications:   . vitamin C  500 mg Oral Daily  . aspirin  81 mg Oral Daily  . Chlorhexidine Gluconate Cloth  6 each Topical Daily  . cholecalciferol  1,000 Units Oral Daily  . feeding supplement  237 mL Oral BID BM  . feeding supplement (PRO-STAT SUGAR FREE 64)  30 mL Oral BID  . finasteride  5 mg Oral Daily  . heparin  5,000 Units Subcutaneous Q8H  . Ipratropium-Albuterol  1 puff Inhalation Q6H  . iron polysaccharides  150 mg Oral Daily  . levETIRAcetam  500 mg Oral BID  . loratadine  10 mg Oral Daily  . mouth rinse  15 mL Mouth Rinse BID  . metoprolol tartrate  12.5 mg Oral BID  . metroNIDAZOLE  500 mg Oral Q8H  .  nutrition supplement (JUVEN)  1 packet Oral BID BM  . pravastatin  40 mg Oral Daily  . saccharomyces boulardii  250 mg Oral BID  . selenium sulfide   Topical Daily  . sodium chloride flush  10-40 mL Intracatheter Q12H  . vitamin B-12  500 mcg Oral Daily  . zinc sulfate  220 mg Oral Daily   Continuous Infusions: . sodium chloride 500 mL (07/07/20 1222)  .  ceFAZolin (ANCEF) IV 2 g (07/07/20 2205)  . famotidine (PEPCID) IV        LOS: 16 days   Marinda Elk  [facility]  07/08/2020, 12:47 PM

## 2020-07-09 DIAGNOSIS — J690 Pneumonitis due to inhalation of food and vomit: Secondary | ICD-10-CM | POA: Diagnosis not present

## 2020-07-09 DIAGNOSIS — U071 COVID-19: Secondary | ICD-10-CM | POA: Diagnosis not present

## 2020-07-09 DIAGNOSIS — M869 Osteomyelitis, unspecified: Secondary | ICD-10-CM | POA: Diagnosis not present

## 2020-07-09 DIAGNOSIS — M8600 Acute hematogenous osteomyelitis, unspecified site: Secondary | ICD-10-CM | POA: Diagnosis not present

## 2020-07-09 LAB — CBC WITH DIFFERENTIAL/PLATELET
Abs Immature Granulocytes: 0.04 10*3/uL (ref 0.00–0.07)
Basophils Absolute: 0 10*3/uL (ref 0.0–0.1)
Basophils Relative: 0 %
Eosinophils Absolute: 0 10*3/uL (ref 0.0–0.5)
Eosinophils Relative: 0 %
HCT: 32 % — ABNORMAL LOW (ref 39.0–52.0)
Hemoglobin: 9.6 g/dL — ABNORMAL LOW (ref 13.0–17.0)
Immature Granulocytes: 1 %
Lymphocytes Relative: 7 %
Lymphs Abs: 0.5 10*3/uL — ABNORMAL LOW (ref 0.7–4.0)
MCH: 31 pg (ref 26.0–34.0)
MCHC: 30 g/dL (ref 30.0–36.0)
MCV: 103.2 fL — ABNORMAL HIGH (ref 80.0–100.0)
Monocytes Absolute: 0.3 10*3/uL (ref 0.1–1.0)
Monocytes Relative: 4 %
Neutro Abs: 6.3 10*3/uL (ref 1.7–7.7)
Neutrophils Relative %: 88 %
Platelets: 263 10*3/uL (ref 150–400)
RBC: 3.1 MIL/uL — ABNORMAL LOW (ref 4.22–5.81)
RDW: 17.7 % — ABNORMAL HIGH (ref 11.5–15.5)
WBC: 7.1 10*3/uL (ref 4.0–10.5)
nRBC: 0 % (ref 0.0–0.2)

## 2020-07-09 LAB — COMPREHENSIVE METABOLIC PANEL
ALT: <5 U/L (ref 0–44)
AST: 13 U/L — ABNORMAL LOW (ref 15–41)
Albumin: 2.2 g/dL — ABNORMAL LOW (ref 3.5–5.0)
Alkaline Phosphatase: 45 U/L (ref 38–126)
Anion gap: 10 (ref 5–15)
BUN: 8 mg/dL (ref 6–20)
CO2: 27 mmol/L (ref 22–32)
Calcium: 7.8 mg/dL — ABNORMAL LOW (ref 8.9–10.3)
Chloride: 101 mmol/L (ref 98–111)
Creatinine, Ser: 0.42 mg/dL — ABNORMAL LOW (ref 0.61–1.24)
GFR, Estimated: >60 mL/min (ref >60)
Glucose, Bld: 101 mg/dL — ABNORMAL HIGH (ref 70–99)
Potassium: 3.4 mmol/L — ABNORMAL LOW (ref 3.5–5.1)
Sodium: 138 mmol/L (ref 135–145)
Total Bilirubin: 0.3 mg/dL (ref 0.3–1.2)
Total Protein: 5.5 g/dL — ABNORMAL LOW (ref 6.5–8.1)

## 2020-07-09 LAB — D-DIMER, QUANTITATIVE: D-Dimer, Quant: 1.82 ug/mL-FEU — ABNORMAL HIGH (ref 0.00–0.50)

## 2020-07-09 LAB — C-REACTIVE PROTEIN: CRP: 2.5 mg/dL — ABNORMAL HIGH (ref <1.0)

## 2020-07-09 NOTE — Care Management Important Message (Signed)
Important Message  Patient Details IM Letter given to the Patient. Name: Tracy Todd MRN: [id number] Date of Birth: Feb 22, 1966   Medicare Important Message Given:  Yes     Caren Macadam 07/09/2020, 10:31 AM

## 2020-07-09 NOTE — Discharge Summary (Addendum)
Physician Discharge Summary  Tracy Todd VCB:[id number] DOB: [birth year] DOA: 06/22/2020  PCP: Tracy Noon, MD  Admit date: 06/22/2020 Discharge date: 07/09/2020  Admitted From: Home Disposition:  SNF  Recommendations for Outpatient Follow-up:  1. Follow up with PCP in 1-2 weeks 2. Please obtain BMP/CBC in one week   Home Health:No Equipment/Devices:None  Discharge Condition:Stable CODE STATUS: DNR Diet recommendation: Dysphagia 1 diet.  Brief/Interim Summary: 54 y.o. 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 on IV antibiotics through PICC line complicated by bedbound status and contractures who presents to the ED on 06/22/2020 for fever and tachycardia and cough due to right lower lobe pneumonia, he was started in treatment for sepsis multifactorial from pneumonia and sacral decubitus ulcer on 06/30/2020 patient was transferred to stepdown due to severe hypotension he did require midodrine 3 times a day for short period of time, ID was consulted and started on Ancef and Flagyl for 6 weeks physical therapy evaluated the patient the recommended skilled nursing facility SARS-CoV-2 PCR was positive which have been negative on admission monoclonal antibodies were given 07/06/2020 as he remained asymptomatic.  Discharge Diagnoses:  Active Problems:   Pressure injury of skin   Sepsis ([place]) COVID-19 viral infection: He tested positive on 07/06/2020 no hypoxia, fever chest x-ray showed faint left lower lobe infiltrate which I have personally reviewed. He was vaccinated he status post monoclonal antibodies in house on 07/07/2019 when he has remained asymptomatic.  Severe sepsis due to right lower lobe aspirate and possibly sacral left trochanteric osteomyelitis which seems to be chronic: His sepsis physiology was treated with IV fluids and IV antibiotics he was started on midodrine. On admission he was started  on IV bank and cefepime infectious disease was consulted blood cultures remain negative till date. They recommended to continue Ancef and IV Flagyl for 6 weeks with an end date of 08/05/2020. PICC line by IR placed on 06/25/2020. PICC line can be discontinued at the time of antibiotic completion.  Acute respiratory failure with hypoxia due to right lower lobe pneumonia: Now resolved.  Dysphagia: Evaluated by speech recommended a dysphagia 1 diet.  Loose stools/hypokalemia: Low due to diarrhea resolved with probiotics.  Possible sick euthyroid syndrome: We will need to have a repeated TSH and free T4 in 6 weeks once he has completed his antibiotic.  Hypocalcemia: Tracy Todd orally now resolved.  Severe protein caloric malnutrition: Continue supplementation.  Hyperlipidemia, Continue statins.  Seizure disorder: Noted no seizure events continue Keppra. Discharge Instructions  Discharge Instructions    [facility] pharmacist to adjust dose for Vancomycin, Aminoglycosides and other anti-infective therapies as requested by physician.   Complete by: As directed    [facility] to provide Cath Flo 14m   Complete by: As directed    Administer for PICC line occlusion and as ordered by physician for other access device issues.   Anaphylaxis Kit: Provided to treat any anaphylactic reaction to the medication being provided to the patient if First Dose or when requested by physician   Complete by: As directed    Epinephrine 117mml vial / amp: Administer 0.109m90m0.109ml17mubcutaneously once for moderate to severe anaphylaxis, nurse to call physician and pharmacy when reaction occurs and call 911 if needed for immediate care   Diphenhydramine 50mg30mIV vial: Administer 25-50mg 30mM PRN for first dose reaction, rash, itching, mild reaction, nurse to call physician and pharmacy when reaction occurs   Sodium Chloride 0.9% NS  555ml IV: Administer if needed for hypovolemic blood  pressure drop or as ordered by physician after call to physician with anaphylactic reaction   Change dressing (specify)   Complete by: As directed    Wound care to left hip and sacral Stage 3 Pressure Injuries:  Cleanse with NS, pat gently dry. Cover defects with size appropriate piece of silver hydrofiber (Aquacel Advantage, Kellie Simmering [phone]). Top with dry gauze and cover with silicone foam dressing. Change Aquacel Advantage daily, ,may reuse silicone foam for up to 3 days.  Change PRN for soiling or rolling of dressing edges.   Change dressing on IV access line weekly and PRN   Complete by: As directed    Diet - low sodium heart healthy   Complete by: As directed    Flush IV access with Sodium Chloride 0.9% and Heparin 10 units/ml or 100 units/ml   Complete by: As directed    Home infusion instructions - [facility]   Complete by: As directed    Instructions: Flush IV access with Sodium Chloride 0.9% and Heparin 10units/ml or 100units/ml   Change dressing on IV access line: Weekly and PRN   Instructions Cath Flo $Remove'2mg'eBmsOeM$ : Administer for PICC Line occlusion and as ordered by physician for other access device   [facility] pharmacist to adjust dose for: Vancomycin, Aminoglycosides and other anti-infective therapies as requested by physician   Increase activity slowly   Complete by: As directed    Method of administration may be changed at the discretion of home infusion pharmacist based upon assessment of the patient and/or caregiver's ability to self-administer the medication ordered   Complete by: As directed      Allergies as of 07/09/2020      Reactions   Penicillins    Unknown Did it involve swelling of the face/tongue/throat, SOB, or low BP? Unk Did it involve sudden or severe rash/hives, skin peeling, or any reaction on the inside of your mouth or nose? Unk Did you need to seek medical attention at a hospital or doctor's office? Unk When did it last  happen?childhood If all above answers are "NO", may proceed with cephalosporin use.      Medication List    STOP taking these medications   bisacodyl 10 MG suppository Commonly known as: DULCOLAX   cefdinir 300 MG capsule Commonly known as: OMNICEF   magnesium hydroxide 400 MG/5ML suspension Commonly known as: MILK OF MAGNESIA   UNABLE TO FIND     TAKE these medications   acetaminophen 500 MG tablet Commonly known as: TYLENOL Take 500 mg by mouth every 6 (six) hours as needed for moderate pain.   Americaine 20 % oral spray Generic drug: benzocaine Apply 1 application topically 3 (three) times daily as needed for throat irritation / pain.   aspirin 81 MG EC tablet Take 1 tablet (81 mg total) by mouth daily.   ceFAZolin  IVPB Commonly known as: ANCEF Inject 2 g into the vein every 8 (eight) hours. Indication:  Sacrococcygeal osteomyelitis First Dose: Yes Last Day of Therapy:  08/06/2020 Labs - Once weekly:  CBC/D and BMP, Labs - Every other week:  ESR and CRP Method of administration: IV Push Method of administration may be changed at the discretion of home infusion pharmacist based upon assessment of the patient and/or caregiver's ability to self-administer the medication ordered.   cetirizine 10 MG tablet Commonly known as: ZYRTEC Take 10 mg by mouth daily as needed for allergies.   Comfort  Shield Adult [employer] Dispense 5 boxes/mo. Use 4-6 times daily for fecal incontinence and diarrhea.   RA Adult Wipes Misc Please dispense 4 boxes/wipes per month.   Underpads Medium Misc Dispense 3 boxes/mo for fecal and urinary incontinence and diarrhea.   diclofenac Sodium 1 % Gel Commonly known as: VOLTAREN Apply 2 g topically 4 (four) times daily. To both knees   Disposable Gloves Misc Use gloves for diaper changes.Dispense 2 boxes   Ensure Plus Liqd Take 237 mLs by mouth 2 (two) times daily between meals.   nutrition supplement (JUVEN) Pack Take 1  packet by mouth 2 (two) times daily between meals. To aid in wound healing.   feeding supplement (PRO-STAT SUGAR FREE 64) Liqd Take 30 mLs by mouth 2 (two) times daily. To aid in wound healing; resident prefers Prostat mixed in water.   FIBER ADULT GUMMIES PO Take 1 tablet by mouth in the morning and at bedtime.   finasteride 5 MG tablet Commonly known as: PROSCAR Take 5 mg by mouth daily.   hydrocortisone 2.5 % cream Apply 1 application topically 2 (two) times daily as needed (skin rash).   iron polysaccharides 150 MG capsule Commonly known as: Nu-Iron Take 1 capsule (150 mg total) by mouth daily.   ketoconazole 2 % shampoo Commonly known as: NIZORAL Apply 1 application topically See admin instructions. Use to wash hair and face, alternate by week.   levETIRAcetam 500 MG tablet Commonly known as: Keppra Take 1 tablet (500 mg total) by mouth 2 (two) times daily.   metroNIDAZOLE 500 MG tablet Commonly known as: FLAGYL Take 1 tablet (500 mg total) by mouth every 8 (eight) hours. What changed: when to take this   Misc. Devices Misc Take by mouth.   Misc. Devices Misc Manual wheelchair. 16 inch wide with elevating leg rests, high reclining back and neck support and 16-inch cushion.   multivitamin with minerals tablet Take 1 tablet by mouth daily.   NON FORMULARY Regular liberalized diet d/t Dx Malnutrition.   pravastatin 40 MG tablet Commonly known as: PRAVACHOL Take 1 tablet (40 mg total) by mouth daily.   RA SALINE ENEMA RE If not relieved by Biscodyl suppository, give disposable Saline Enema rectally X 1 dose/24 hrs as needed (Do not use constipation standing orders for residents with renal failure/CFR less than 30. Contact MD for orders)(Physician Or   saccharomyces boulardii 250 MG capsule Commonly known as: FLORASTOR Take 1 capsule (250 mg total) by mouth 2 (two) times daily.   Santyl ointment Generic drug: collagenase Apply 1 application topically daily.    selenium sulfide 1 % Lotn Commonly known as: SELSUN Apply 1 application topically daily.   vitamin B-12 500 MCG tablet Commonly known as: CYANOCOBALAMIN Take 1 tablet (500 mcg total) by mouth daily.   zinc gluconate 50 MG tablet Take 1 tablet (50 mg total) by mouth daily.            Discharge Care Instructions  (From admission, onward)         Start     Ordered   07/09/20 0000  Change dressing (specify)       Comments: Wound care to left hip and sacral Stage 3 Pressure Injuries:  Cleanse with NS, pat gently dry. Cover defects with size appropriate piece of silver hydrofiber (Aquacel Advantage, Kellie Simmering [phone]). Top with dry gauze and cover with silicone foam dressing. Change Aquacel Advantage daily, ,may reuse silicone foam for up to 3 days.  Change PRN for soiling  or rolling of dressing edges.   07/09/20 0912   07/06/20 0000  Change dressing on IV access line weekly and PRN  (Home infusion instructions - [facility] )        07/06/20 1043          Follow-up Information    Tracy Noon, MD. Call in 1 day(s).   Specialty: Family Medicine Why: Please call for a post hospital follow-up appointment. Contact information: [place] [address] [phone]        Jerline Pain, MD .   Specialty: Cardiology Contact information: [phone] [address] [place] [address] [phone]        Rosiland Oz, MD. Call in 1 day(s).   Specialty: Infectious Diseases Why: Please call for a post hospital follow-up appointment. Contact information: [address] [place] [address] [phone]              Allergies  Allergen Reactions  . Penicillins     Unknown Did it involve swelling of the face/tongue/throat, SOB, or low BP? Unk Did it involve sudden or severe rash/hives, skin peeling, or any reaction on the inside of your mouth or nose? Unk Did you need to seek medical attention at a  hospital or doctor's office? Unk When did it last happen?childhood If all above answers are "NO", may proceed with cephalosporin use.      Consultations:  Infectious disease   Procedures/Studies: CT Chest W Contrast  Result Date: 06/22/2020 CLINICAL DATA:  Tachypnea, sacral decubitus ulcer, fever, sepsis EXAM: CT CHEST, ABDOMEN, AND PELVIS WITH CONTRAST TECHNIQUE: Multidetector CT imaging of the chest, abdomen and pelvis was performed following the standard protocol during bolus administration of intravenous contrast. CONTRAST:  157m OMNIPAQUE IOHEXOL 300 MG/ML  SOLN COMPARISON:  06/22/2020, 09/16/2019, 02/05/2020 FINDINGS: CT CHEST FINDINGS Cardiovascular: The heart and great vessels are unremarkable without pericardial effusion. Normal caliber of the thoracic aorta. Mediastinum/Nodes: No enlarged mediastinal, hilar, or axillary lymph nodes. Thyroid gland, trachea, and esophagus demonstrate no significant findings. Lungs/Pleura: Dependent right lower lobe consolidation could reflect airspace disease or atelectasis. No effusion or pneumothorax. Mucoid material is seen within the right lower lobe bronchus. No effusion or pneumothorax. Musculoskeletal: No acute or destructive bony lesions. Reconstructed images demonstrate no additional findings. CT ABDOMEN PELVIS FINDINGS Hepatobiliary: No focal liver abnormality is seen. No gallstones, gallbladder wall thickening, or biliary dilatation. Pancreas: Unremarkable. No pancreatic ductal dilatation or surrounding inflammatory changes. Spleen: Normal in size without focal abnormality. Adrenals/Urinary Tract: There is a punctate calcification layering dependently within the bladder. No other urinary tract calculi or signs of obstruction. The kidneys enhance normally and symmetrically. The adrenals are unremarkable. Stomach/Bowel: No bowel obstruction or ileus. No bowel wall thickening or inflammatory change. Vascular/Lymphatic: No significant vascular  findings are present. No enlarged abdominal or pelvic lymph nodes. Reproductive: Prostate is unremarkable. Other: No free fluid or free gas. No abdominal wall hernia. Musculoskeletal: Persistent decubitus ulceration overlying the distal sacrum and coccyx. Since the prior exam, erosive changes are seen within the dorsal aspect of the lower sacrum at approximately the S3 and S4 levels. No periosteal reaction. Bilateral L5 pars defects and grade 2 anterolisthesis of L5 on S1 unchanged. Stable chronic compression deformities involving the superior endplates of the L3 and L4 vertebral bodies. Severe spondylosis at L5/S1 unchanged. Reconstructed images demonstrate no additional findings. IMPRESSION: 1. Dependent right lower lobe consolidation with mucoid material in the right lower lobe bronchus.  Findings could reflect mucous plugging, aspiration, or infection. 2. Persistent decubitus ulceration overlying the distal sacrum and coccyx. Since the prior exam, erosive changes within the dorsal aspect of the lower sacrum at approximately the S3 and S4 levels, consistent with chronic osteomyelitis. 3. Punctate nonobstructing calculus within the bladder lumen. Electronically Signed   By: Randa Ngo M.D.   On: 06/22/2020 21:46   CT ABDOMEN PELVIS W CONTRAST  Result Date: 06/22/2020 CLINICAL DATA:  Tachypnea, sacral decubitus ulcer, fever, sepsis EXAM: CT CHEST, ABDOMEN, AND PELVIS WITH CONTRAST TECHNIQUE: Multidetector CT imaging of the chest, abdomen and pelvis was performed following the standard protocol during bolus administration of intravenous contrast. CONTRAST:  115mL OMNIPAQUE IOHEXOL 300 MG/ML  SOLN COMPARISON:  06/22/2020, 09/16/2019, 02/05/2020 FINDINGS: CT CHEST FINDINGS Cardiovascular: The heart and great vessels are unremarkable without pericardial effusion. Normal caliber of the thoracic aorta. Mediastinum/Nodes: No enlarged mediastinal, hilar, or axillary lymph nodes. Thyroid gland, trachea, and esophagus  demonstrate no significant findings. Lungs/Pleura: Dependent right lower lobe consolidation could reflect airspace disease or atelectasis. No effusion or pneumothorax. Mucoid material is seen within the right lower lobe bronchus. No effusion or pneumothorax. Musculoskeletal: No acute or destructive bony lesions. Reconstructed images demonstrate no additional findings. CT ABDOMEN PELVIS FINDINGS Hepatobiliary: No focal liver abnormality is seen. No gallstones, gallbladder wall thickening, or biliary dilatation. Pancreas: Unremarkable. No pancreatic ductal dilatation or surrounding inflammatory changes. Spleen: Normal in size without focal abnormality. Adrenals/Urinary Tract: There is a punctate calcification layering dependently within the bladder. No other urinary tract calculi or signs of obstruction. The kidneys enhance normally and symmetrically. The adrenals are unremarkable. Stomach/Bowel: No bowel obstruction or ileus. No bowel wall thickening or inflammatory change. Vascular/Lymphatic: No significant vascular findings are present. No enlarged abdominal or pelvic lymph nodes. Reproductive: Prostate is unremarkable. Other: No free fluid or free gas. No abdominal wall hernia. Musculoskeletal: Persistent decubitus ulceration overlying the distal sacrum and coccyx. Since the prior exam, erosive changes are seen within the dorsal aspect of the lower sacrum at approximately the S3 and S4 levels. No periosteal reaction. Bilateral L5 pars defects and grade 2 anterolisthesis of L5 on S1 unchanged. Stable chronic compression deformities involving the superior endplates of the L3 and L4 vertebral bodies. Severe spondylosis at L5/S1 unchanged. Reconstructed images demonstrate no additional findings. IMPRESSION: 1. Dependent right lower lobe consolidation with mucoid material in the right lower lobe bronchus. Findings could reflect mucous plugging, aspiration, or infection. 2. Persistent decubitus ulceration overlying the  distal sacrum and coccyx. Since the prior exam, erosive changes within the dorsal aspect of the lower sacrum at approximately the S3 and S4 levels, consistent with chronic osteomyelitis. 3. Punctate nonobstructing calculus within the bladder lumen. Electronically Signed   By: Randa Ngo M.D.   On: 06/22/2020 21:46   DG CHEST PORT 1 VIEW  Result Date: 07/06/2020 CLINICAL DATA:  54 year old male with down syndrome, positive COVID-19. EXAM: PORTABLE CHEST 1 VIEW COMPARISON:  Portable chest 07/02/2020 and earlier. FINDINGS: Portable AP semi upright view at 1437 hours. Left upper extremity PICC line remains in place, tip at the lower SVC level. Increasing patchy and indistinct opacity in the left mid and lower lung. Allowing for portable technique the right lung remains clear. Mediastinal contours remain normal. No pneumothorax or pleural effusion. No acute osseous abnormality identified. Visible bowel gas pattern is within normal limits. IMPRESSION: Increasing indistinct opacity in the left lung compatible with COVID-19 pneumonia. Right lung relatively spared. Electronically Signed   By: Lemmie Evens  [place] Crane M.D.   On: 07/06/2020 14:51   DG CHEST PORT 1 VIEW  Result Date: 07/02/2020 CLINICAL DATA:  Aspiration pneumonia EXAM: PORTABLE CHEST 1 VIEW COMPARISON:  Two days ago FINDINGS: Indistinct opacity at the lung bases. No visible effusion or pneumothorax. Normal heart size. Left PICC with tip at the upper right atrium. IMPRESSION: Stable infiltrates at the lung bases. Electronically Signed   By: Monte Fantasia M.D.   On: 07/02/2020 05:27   DG CHEST PORT 1 VIEW  Result Date: 06/30/2020 CLINICAL DATA:  Seizures, syncope, osteomyelitis EXAM: PORTABLE CHEST 1 VIEW COMPARISON:  06/22/2020 FINDINGS: Single frontal view of the chest demonstrates an unremarkable cardiac silhouette. Left-sided PICC tip projects over the atrial caval junction. Persistent patchy consolidation within the medial right lung base. No effusion or  pneumothorax. No acute bony abnormalities. IMPRESSION: 1. Persistent right lower lobe consolidation, not appreciably changed since prior CT. Favor mucous plugging/atelectasis over aspiration or infection. Electronically Signed   By: Randa Ngo M.D.   On: 06/30/2020 17:00   DG Chest Port 1 View  Result Date: 06/22/2020 CLINICAL DATA:  Sacral decubitus ulcer, sepsis, fever EXAM: PORTABLE CHEST 1 VIEW COMPARISON:  02/14/2020 FINDINGS: Single frontal view of the chest demonstrates a stable cardiac silhouette. Increased density at the medial right lung base could reflect atelectasis/scarring or airspace disease. There is slight elevation of the right hemidiaphragm, new since prior study. No effusion or pneumothorax. No acute bony abnormalities. IMPRESSION: 1. Increased density at the medial right lung base, with elevation of the right hemidiaphragm suggesting volume loss and atelectasis or scarring. Superimposed airspace disease cannot be completely excluded in the clinical scenario. Electronically Signed   By: Randa Ngo M.D.   On: 06/22/2020 17:32   DG Swallowing Func-Speech Pathology  Result Date: 06/24/2020 Objective Swallowing Evaluation: Type of Study: MBS-Modified Barium Swallow Study  Patient Details Name: Tracy Todd MRN: [id number] Date of Birth: [birth year] Today's Date: 06/24/2020 Time: SLP Start Time (ACUTE ONLY): 1305 -SLP Stop Time (ACUTE ONLY): 1335 SLP Time Calculation (min) (ACUTE ONLY): 30 min Past Medical History: Past Medical History: Diagnosis Date . Arthritis  . Down's syndrome  . Headache  . Hyperlipidemia  . Iron deficiency anemia  . Recurrent syncope  . Seizure disorder ([place]) 05/21/2020 . Seizures ([place])  Past Surgical History: No past surgical history on file. HPI: 54yo male admitted 06/22/20 with fever and cough with cxr concerning for aspiration pna. PMH: seizures, recurrent syncope, HLD, iron deficiency anemia, Down Syndrome, nonverbal, bedbound last 7-8 months with steady and  chronic decline in function.  Pt underwent BSE with recommendation for MBS due to pt's pna and coughing post-swallow of minimal intake he would accept.  Subjective: pt awake in chair, required repositioning for view Assessment / Plan / Recommendation CHL IP CLINICAL IMPRESSIONS 06/24/2020 Clinical Impression Mild oropharyngeal dysphagia without aspiration or laryngeal penetration to vocal cord with any consistency tested.  Of note, pt did NOT cough during the study = which he is clinically conducting after his intake per notes from BSE.  Pt does present with delayed oral transiting, lingual pumping with foods requiring mastication due to discoordination.  Pharyngeal swallow was marginally weak with decreased tongue base retraction which allows minimal retention of solids without explicit awareness.  Secretions also appeared retained in pharynx and mixed with retained barium.  Recommend pt continue regular/thin diet and medications with pudding - whole.  Advise pt start all intake with liquids and drink liquids during meal.  Sister present during the pt's  MBS and was informed to findings/recommendations.  Since positioning is an issue for this pt, advise to consider reverse [place] for optimal safety with intake.  Will follow up briefly for education/po tolerance.  Advised pt's sister to mitigation strategies for this pt as he is unable to follow directions for strengthening exercises, etc. Reviewed MBS findings with pt's sister including pt's appearance of osteophytes along with deconditioning possibly impacting epiglottic deflection, contributing to dysphagia.  SLP Visit Diagnosis Dysphagia, oropharyngeal phase (R13.12) Attention and concentration deficit following -- Frontal lobe and executive function deficit following -- Impact on safety and function Mild aspiration risk   CHL IP TREATMENT RECOMMENDATION 06/24/2020 Treatment Recommendations Therapy as outlined in treatment plan below   Prognosis 06/24/2020  Prognosis for Safe Diet Advancement Fair Barriers to Reach Goals Cognitive deficits Barriers/Prognosis Comment -- CHL IP DIET RECOMMENDATION 06/24/2020 SLP Diet Recommendations Regular solids;Thin liquid Liquid Administration via Cup;Straw Medication Administration Whole meds with puree Compensations Minimize environmental distractions;Slow rate;Small sips/bites Postural Changes Seated upright at 90 degrees;Remain semi-upright after after feeds/meals (Comment)   CHL IP OTHER RECOMMENDATIONS 06/24/2020 Recommended Consults -- Oral Care Recommendations Oral care QID Other Recommendations --   CHL IP FOLLOW UP RECOMMENDATIONS 06/24/2020 Follow up Recommendations 24 hour supervision/assistance   CHL IP FREQUENCY AND DURATION 06/24/2020 Speech Therapy Frequency (ACUTE ONLY) -- Treatment Duration 1 week;2 weeks      CHL IP ORAL PHASE 06/24/2020 Oral Phase WFL Oral - Pudding Teaspoon -- Oral - Pudding Cup -- Oral - Honey Teaspoon -- Oral - Honey Cup -- Oral - Nectar Teaspoon -- Oral - Nectar Cup -- Oral - Nectar Straw WFL Oral - Thin Teaspoon WFL Oral - Thin Cup -- Oral - Thin Straw WFL Oral - Puree WFL Oral - Mech Soft Premature spillage;Weak lingual manipulation;Lingual pumping;Reduced posterior propulsion;Delayed oral transit Oral - Regular -- Oral - Multi-Consistency -- Oral - Pill WFL Oral Phase - Comment --  CHL IP PHARYNGEAL PHASE 06/24/2020 Pharyngeal Phase WFL Pharyngeal- Pudding Teaspoon -- Pharyngeal -- Pharyngeal- Pudding Cup -- Pharyngeal -- Pharyngeal- Honey Teaspoon -- Pharyngeal -- Pharyngeal- Honey Cup -- Pharyngeal -- Pharyngeal- Nectar Teaspoon -- Pharyngeal -- Pharyngeal- Nectar Cup -- Pharyngeal -- Pharyngeal- Nectar Straw WFL Pharyngeal Material does not enter airway Pharyngeal- Thin Teaspoon WFL Pharyngeal Material does not enter airway Pharyngeal- Thin Cup -- Pharyngeal -- Pharyngeal- Thin Straw Penetration/Aspiration during swallow Pharyngeal Material enters airway, remains ABOVE vocal cords then ejected  out Pharyngeal- Puree WFL Pharyngeal Material does not enter airway Pharyngeal- Mechanical Soft Delayed swallow initiation-vallecula;Pharyngeal residue - valleculae;Reduced epiglottic inversion;Reduced tongue base retraction Pharyngeal Material does not enter airway Pharyngeal- Regular -- Pharyngeal -- Pharyngeal- Multi-consistency -- Pharyngeal -- Pharyngeal- Pill WFL Pharyngeal Material does not enter airway Pharyngeal Comment --  CHL IP CERVICAL ESOPHAGEAL PHASE 06/24/2020 Cervical Esophageal Phase WFL Pudding Teaspoon -- Pudding Cup -- Honey Teaspoon -- Honey Cup -- Nectar Teaspoon -- Nectar Cup -- Nectar Straw -- Thin Teaspoon -- Thin Cup -- Thin Straw -- Puree -- Mechanical Soft -- Regular -- Multi-consistency -- Pill -- Cervical Esophageal Comment WFL Kathleen Lime, MS [facility] SLP Acute Rehab Services Office [phone] Pager [phone] [name] 06/24/2020, 2:04 PM              [facility] PLACEMENT LEFT >5 YRS INC IMG GUIDE  Result Date: 06/25/2020 INDICATION: A 54 year old male with down syndrome and acute hematogenous osteomyelitis. He requires PICC line placement for outpatient IV antibiotic therapy. EXAM: PICC LINE PLACEMENT WITH ULTRASOUND AND FLUOROSCOPIC GUIDANCE MEDICATIONS: None.  ANESTHESIA/SEDATION: 1 mg Versed given for anxiolysis. FLUOROSCOPY TIME:  Fluoroscopy Time: 0 minutes 18 seconds (1 mGy). COMPLICATIONS: None immediate. PROCEDURE: The patient was advised of the possible risks and complications and agreed to undergo the procedure. The patient was then brought to the angiographic suite for the procedure. The right/left arm was prepped with chlorhexidine, draped in the usual sterile fashion using maximum barrier technique (cap and mask, sterile gown, sterile gloves, large sterile sheet, hand hygiene and cutaneous antisepsis) and infiltrated locally with 1% Lidocaine. Ultrasound demonstrated patency of the right basilic vein, and this was documented with an image. Under real-time ultrasound  guidance, this vein was accessed with a 21 gauge micropuncture needle and image documentation was performed. A 0.018 wire was introduced in to the vein. Over this, a 5 [place] single lumen power-injectable PICC was advanced to the lower SVC/right atrial junction. Fluoroscopy during the procedure and fluoro spot radiograph confirms appropriate catheter position. The catheter was flushed and covered with a sterile dressing. Catheter length: 39 cm IMPRESSION: Successful right arm Power PICC line placement with ultrasound and fluoroscopic guidance. The catheter is ready for use. Electronically Signed   By: Jacqulynn Cadet M.D.   On: 06/25/2020 16:07   [place] EKG SITE RITE  Result Date: 07/01/2020 If Site Rite image not attached, placement could not be confirmed due to current cardiac rhythm.     Subjective: No new complaints.  Discharge Exam: Vitals:   07/08/20 1947 07/09/20 0440  BP: 112/77 101/70  Pulse: 94 66  Resp: 15 20  Temp: 98.6 F (37 C) 98.1 F (36.7 C)  SpO2: 91%    Vitals:   07/08/20 0339 07/08/20 1431 07/08/20 1947 07/09/20 0440  BP: 101/70 97/65 112/77 101/70  Pulse: 71 76 94 66  Resp: 18 18 15 20   Temp: 97.9 F (36.6 C) 97.9 F (36.6 C) 98.6 F (37 C) 98.1 F (36.7 C)  TempSrc: Oral  Axillary   SpO2: 96% 99% 91%   Weight:      Height:        General: Pt is alert, awake, not in acute distress Cardiovascular: RRR, S1/S2 +, no rubs, no gallops Respiratory: CTA bilaterally, no wheezing, no rhonchi Abdominal: Soft, NT, ND, bowel sounds + Extremities: no edema, no cyanosis    The results of significant diagnostics from this hospitalization (including imaging, microbiology, ancillary and laboratory) are listed below for reference.     Microbiology: Recent Results (from the past 240 hour(s))  Culture, blood (routine x 2)     Status: None   Collection Time: 06/30/20  6:02 PM   Specimen: BLOOD LEFT HAND  Result Value Ref Range Status   Specimen Description    Final    BLOOD LEFT HAND Performed at [facility], [place] [address] [place], [place] [address]    Special Requests   Final    BOTTLES DRAWN AEROBIC ONLY Blood Culture results may not be optimal due to an inadequate volume of blood received in culture bottles Performed at [place] [address], [place], [place] [address]    Culture   Final    NO GROWTH 5 DAYS Performed at [facility], [place] [address] [place],  [address]    Report Status 07/05/2020 FINAL  Final  Culture, blood (routine x 2)     Status: None   Collection Time: 06/30/20  6:02 PM   Specimen: BLOOD LEFT FOREARM  Result Value Ref Range Status   Specimen  Description   Final    BLOOD LEFT FOREARM Performed at [place] [address], [place], [place] [address]    Special Requests   Final    BOTTLES DRAWN AEROBIC ONLY Blood Culture adequate volume Performed at [place] [address], [place], [place] [address]    Culture   Final    NO GROWTH 5 DAYS Performed at [facility], [place] [address], [place], [place] [address]    Report Status 07/05/2020 FINAL  Final  Respiratory Panel by RT PCR (Flu A&B, Covid) - Nasopharyngeal Swab     Status: Abnormal   Collection Time: 07/06/20 11:32 AM   Specimen: Nasopharyngeal Swab  Result Value Ref Range Status   SARS Coronavirus 2 by RT PCR POSITIVE (A) NEGATIVE Final    Comment: RESULT CALLED TO, READ BACK BY AND VERIFIED WITH: GIRLEY,K. RN $RemoveBefo'@1414'[id number]$  07/06/20 BILLINGSLEY,L (NOTE) SARS-CoV-2 target nucleic acids are DETECTED.  SARS-CoV-2 RNA is generally detectable in upper respiratory specimens  during the acute phase of infection. Positive results are indicative of the presence of the identified virus, but do not rule out bacterial infection or co-infection with other pathogens not detected by the test. Clinical correlation with patient history and other diagnostic  information is necessary to determine patient infection status. The expected result is Negative.  Fact Sheet for Patients:  PinkCheek.be  Fact Sheet for Healthcare Providers: GravelBags.it  This test is not yet approved or cleared by the [place] FDA and  has been authorized for detection and/or diagnosis of SARS-CoV-2 by FDA under an Emergency Use Authorization (EUA).  This EUA will remain in effect (meaning this test  can be used) for the duration of  the COVID-19 declaration under Section 564(b)(1) of the Act, 21 U.S.C. section 360bbb-3(b)(1), unless the authorization is terminated or revoked sooner.      Influenza A by PCR NEGATIVE NEGATIVE Final   Influenza B by PCR NEGATIVE NEGATIVE Final    Comment: (NOTE) The Xpert Xpress SARS-CoV-2/FLU/RSV assay is intended as an aid in  the diagnosis of influenza from Nasopharyngeal swab specimens and  should not be used as a sole basis for treatment. Nasal washings and  aspirates are unacceptable for Xpert Xpress SARS-CoV-2/FLU/RSV  testing.  Fact Sheet for Patients: PinkCheek.be  Fact Sheet for Healthcare Providers: GravelBags.it  This test is not yet approved or cleared by the [place] FDA and  has been authorized for detection and/or diagnosis of SARS-CoV-2 by  FDA under an Emergency Use Authorization (EUA). This EUA will remain  in effect (meaning this test can be used) for the duration of the  Covid-19 declaration under Section 564(b)(1) of the Act, 21  U.S.C. section 360bbb-3(b)(1), unless the authorization is  terminated or revoked. Performed at [facility], [place] [address] [place], [place] [address]   SARS Coronavirus 2 by RT PCR (hospital order, performed in [facility] hospital lab) Nasopharyngeal Nasopharyngeal Swab     Status: Abnormal   Collection Time: 07/06/20  3:20 PM    Specimen: Nasopharyngeal Swab  Result Value Ref Range Status   SARS Coronavirus 2 POSITIVE (A) NEGATIVE Final    Comment: CRITICAL RESULT CALLED TO, READ BACK BY AND VERIFIED WITH: WILLIAMS,B. RN $RemoveBefore'@1735'[id number]$  07/06/20 BILLINGSLEY,L (NOTE) SARS-CoV-2 target nucleic acids are DETECTED  SARS-CoV-2 RNA is generally detectable in upper respiratory specimens  during the acute phase of infection.  Positive results are indicative  of the presence of the identified virus, but  do not rule out bacterial infection or co-infection with other pathogens not detected by the test.  Clinical correlation with patient history and  other diagnostic information is necessary to determine patient infection status.  The expected result is negative.  Fact Sheet for Patients:   StrictlyIdeas.no   Fact Sheet for Healthcare Providers:   BankingDealers.co.za    This test is not yet approved or cleared by the [place] FDA and  has been authorized for detection and/or diagnosis of SARS-CoV-2 by FDA under an Emergency Use Authorization (EUA).  This EUA will remain in effec t (meaning this test can be used) for the duration of  the COVID-19 declaration under Section 564(b)(1) of the Act, 21 U.S.C. section 360-bbb-3(b)(1), unless the authorization is terminated or revoked sooner.  Performed at [facility], [place] [address], [place], [place] [address]      Labs: BNP (last 3 results) Recent Labs    07/06/20 1630  BNP 774.1*   Basic Metabolic Panel: Recent Labs  Lab 07/03/20 0555 07/03/20 0555 07/04/20 0500 07/04/20 0500 07/05/20 0615 07/06/20 0705 07/07/20 0410 07/08/20 0500 07/09/20 0430  NA 141   < > 139   < > 139 139 139 138 138  K 4.0   < > 3.9   < > 3.4* 4.1 3.5 4.0 3.4*  CL 112*   < > 106   < > 103 104 104 105 101  CO2 23   < > 24   < > 28 30 28 29 27   GLUCOSE 97   < > 95   < > 106* 125* 95 95 101*  BUN 9   < > 11   < > 14 14  16 18 8   CREATININE 0.41*   < > 0.43*   < > 0.58* 0.44* 0.37* 0.44* 0.42*  CALCIUM 7.0*   < > 7.4*   < > 7.9* 7.8* 7.8* 7.7* 7.8*  MG 1.7  --  1.8  --   --  2.0  --   --   --   PHOS 2.9  --   --   --   --   --   --   --   --    < > = values in this interval not displayed.   Liver Function Tests: Recent Labs  Lab 07/04/20 0500 07/05/20 0615 07/07/20 0410 07/08/20 0500 07/09/20 0430  AST 27 23 16 15  13*  ALT 11 10 6 5  <5  ALKPHOS 44 47 45 46 45  BILITOT 0.2* 0.2* 0.3 0.3 0.3  PROT 5.1* 5.1* 5.5* 5.4* 5.5*  ALBUMIN 1.9* 2.1* 2.1* 2.1* 2.2*   No results for input(s): LIPASE, AMYLASE in the last 168 hours. No results for input(s): AMMONIA in the last 168 hours. CBC: Recent Labs  Lab 07/07/20 0410 07/08/20 0500 07/09/20 0430  WBC 3.4* 3.7* 7.1  NEUTROABS 2.6 2.8 6.3  HGB 8.8* 9.5* 9.6*  HCT 29.7* 31.4* 32.0*  MCV 103.8* 104.3* 103.2*  PLT 236 256 263   Cardiac Enzymes: No results for input(s): CKTOTAL, CKMB, CKMBINDEX, TROPONINI in the last 168 hours. BNP: Invalid input(s): POCBNP CBG: Recent Labs  Lab 07/05/20 1723  GLUCAP 124*   D-Dimer Recent Labs    07/08/20 0500 07/09/20 0430  DDIMER 2.28* 1.82*   Hgb A1c No results for input(s): HGBA1C in the last 72 hours. Lipid Profile No results for input(s): CHOL, HDL, LDLCALC, TRIG, CHOLHDL, LDLDIRECT in the last 72 hours. Thyroid function studies No results  for input(s): TSH, T4TOTAL, T3FREE, THYROIDAB in the last 72 hours.  Invalid input(s): FREET3 Anemia work up Recent Labs    07/06/20 1630 07/07/20 0410  FERRITIN 117 121   Urinalysis    Component Value Date/Time   COLORURINE YELLOW 06/22/2020 [place] 06/22/2020 2224   LABSPEC 1.035 (H) 06/22/2020 2224   PHURINE 5.0 06/22/2020 2224   GLUCOSEU NEGATIVE 06/22/2020 2224   HGBUR NEGATIVE 06/22/2020 2224   BILIRUBINUR NEGATIVE 06/22/2020 2224   KETONESUR NEGATIVE 06/22/2020 2224   PROTEINUR NEGATIVE 06/22/2020 2224   NITRITE  NEGATIVE 06/22/2020 2224   LEUKOCYTESUR TRACE (A) 06/22/2020 2224   Sepsis Labs Invalid input(s): PROCALCITONIN,  WBC,  LACTICIDVEN Microbiology Recent Results (from the past 240 hour(s))  Culture, blood (routine x 2)     Status: None   Collection Time: 06/30/20  6:02 PM   Specimen: BLOOD LEFT HAND  Result Value Ref Range Status   Specimen Description   Final    BLOOD LEFT HAND Performed at [facility], [place] [address] [place], [place] [address]    Special Requests   Final    BOTTLES DRAWN AEROBIC ONLY Blood Culture results may not be optimal due to an inadequate volume of blood received in culture bottles Performed at [place] [address] [place], [place] [address]    Culture   Final    NO GROWTH 5 DAYS Performed at [facility], [place] [address] [place], [place] [address]    Report Status 07/05/2020 FINAL  Final  Culture, blood (routine x 2)     Status: None   Collection Time: 06/30/20  6:02 PM   Specimen: BLOOD LEFT FOREARM  Result Value Ref Range Status   Specimen Description   Final    BLOOD LEFT FOREARM Performed at [place] [address] [place], [place] [address]    Special Requests   Final    BOTTLES DRAWN AEROBIC ONLY Blood Culture adequate volume Performed at [place] [address] [place], [place] [address]    Culture   Final    NO GROWTH 5 DAYS Performed at [facility], [place] [address] [place], [place] [address]    Report Status 07/05/2020 FINAL  Final  Respiratory Panel by RT PCR (Flu A&B, Covid) - Nasopharyngeal Swab     Status: Abnormal   Collection Time: 07/06/20 11:32 AM   Specimen: Nasopharyngeal Swab  Result Value Ref Range Status   SARS Coronavirus 2 by RT PCR POSITIVE (A) NEGATIVE Final    Comment: RESULT CALLED TO, READ BACK BY AND VERIFIED WITH: GIRLEY,K. RN @1414  07/06/20 BILLINGSLEY,L (NOTE) SARS-CoV-2 target nucleic acids are  DETECTED.  SARS-CoV-2 RNA is generally detectable in upper respiratory specimens  during the acute phase of infection. Positive results are indicative of the presence of the identified virus, but do not rule out bacterial infection or co-infection with other pathogens not detected by the test. Clinical correlation with patient history and other diagnostic information is necessary to determine patient infection status. The expected result is Negative.  Fact Sheet for Patients:  PinkCheek.be  Fact Sheet for Healthcare Providers: GravelBags.it  This test is not yet approved or cleared by the [place] FDA and  has been authorized for detection and/or diagnosis of SARS-CoV-2 by FDA under an Emergency Use Authorization (EUA).  This EUA will remain in effect (meaning this test  can be used) for the duration of  the COVID-19 declaration under Section 564(b)(1) of the Act, 21 U.S.C. section 360bbb-3(b)(1), unless the authorization is terminated or revoked sooner.      Influenza A by PCR NEGATIVE NEGATIVE Final   Influenza B by PCR NEGATIVE NEGATIVE Final    Comment: (NOTE) The Xpert Xpress SARS-CoV-2/FLU/RSV assay is intended as an aid in  the diagnosis of influenza from Nasopharyngeal swab specimens and  should not be used as a sole basis for treatment. Nasal washings and  aspirates are unacceptable for Xpert Xpress SARS-CoV-2/FLU/RSV  testing.  Fact Sheet for Patients: PinkCheek.be  Fact Sheet for Healthcare Providers: GravelBags.it  This test is not yet approved or cleared by the [place] FDA and  has been authorized for detection and/or diagnosis of SARS-CoV-2 by  FDA under an Emergency Use Authorization (EUA). This EUA will remain  in effect (meaning this test can be used) for the duration of the  Covid-19 declaration under Section 564(b)(1) of the Act,  21  U.S.C. section 360bbb-3(b)(1), unless the authorization is  terminated or revoked. Performed at [facility], [place] [address] [place], [place] [address]   SARS Coronavirus 2 by RT PCR (hospital order, performed in [facility] hospital lab) Nasopharyngeal Nasopharyngeal Swab     Status: Abnormal   Collection Time: 07/06/20  3:20 PM   Specimen: Nasopharyngeal Swab  Result Value Ref Range Status   SARS Coronavirus 2 POSITIVE (A) NEGATIVE Final    Comment: CRITICAL RESULT CALLED TO, READ BACK BY AND VERIFIED WITH: WILLIAMS,B. RN @1735  07/06/20 BILLINGSLEY,L (NOTE) SARS-CoV-2 target nucleic acids are DETECTED  SARS-CoV-2 RNA is generally detectable in upper respiratory specimens  during the acute phase of infection.  Positive results are indicative  of the presence of the identified virus, but do not rule out bacterial infection or co-infection with other pathogens not detected by the test.  Clinical correlation with patient history and  other diagnostic information is necessary to determine patient infection status.  The expected result is negative.  Fact Sheet for Patients:   StrictlyIdeas.no   Fact Sheet for Healthcare Providers:   BankingDealers.co.za    This test is not yet approved or cleared by the [place] FDA and  has been authorized for detection and/or diagnosis of SARS-CoV-2 by FDA under an Emergency Use Authorization (EUA).  This EUA will remain in effec t (meaning this test can be used) for the duration of  the COVID-19 declaration under Section 564(b)(1) of the Act, 21 U.S.C. section 360-bbb-3(b)(1), unless the authorization is terminated or revoked sooner.  Performed at [facility], [place] [address] [place], [place] [address]      Time coordinating discharge: Over 40 minutes  SIGNED:   Charlynne Cousins, MD  [facility] 07/09/2020, 9:13 AM Pager   If  7PM-7AM, please contact night-coverage [email or web address] Password [email or web address]

## 2020-07-09 NOTE — TOC Progression Note (Addendum)
Transition of Care [facility]) - Progression Note    Patient Details  Name: Tracy Todd MRN: [id number] Date of Birth: [birth year]  [facility] CM/SW Contact  Ida Rogue, [place] Phone Number: 07/09/2020, 9:12 AM  Clinical Narrative:   Left message for sister to call back re: placement at [facility]. TOC will continue to follow during the course of hospitalization.  Addendum: [facility] not have isolation bed until tomorrow.  Addendum II: Spoke with sister who would prefer patient go to [facility]. [facility] states they are no longer admitting COVID + residents. I also checked with [facility]; same result. Left message for Lorrie with [facility] system to see if they have anything available.  Called sister to update her.  Also let her know there is a d/c order, but that patient will not be moved today as bed is not available today.  Will continue to update her as I have information.     Expected Discharge Plan: Skilled Nursing Facility Barriers to Discharge: SNF Pending bed offer  Expected Discharge Plan and Services Expected Discharge Plan: Skilled Nursing Facility       Living arrangements for the past 2 months: Single Family Home Expected Discharge Date: 07/06/20                                     Social Determinants of Health (SDOH) Interventions    Readmission Risk Interventions No flowsheet data found.

## 2020-07-10 DIAGNOSIS — U071 COVID-19: Secondary | ICD-10-CM | POA: Diagnosis not present

## 2020-07-10 DIAGNOSIS — M8600 Acute hematogenous osteomyelitis, unspecified site: Secondary | ICD-10-CM | POA: Diagnosis not present

## 2020-07-10 DIAGNOSIS — E870 Hyperosmolality and hypernatremia: Secondary | ICD-10-CM | POA: Diagnosis not present

## 2020-07-10 LAB — COMPREHENSIVE METABOLIC PANEL
ALT: 5 U/L (ref 0–44)
AST: 13 U/L — ABNORMAL LOW (ref 15–41)
Albumin: 2.2 g/dL — ABNORMAL LOW (ref 3.5–5.0)
Alkaline Phosphatase: 47 U/L (ref 38–126)
Anion gap: 8 (ref 5–15)
BUN: 10 mg/dL (ref 6–20)
CO2: 28 mmol/L (ref 22–32)
Calcium: 7.8 mg/dL — ABNORMAL LOW (ref 8.9–10.3)
Chloride: 104 mmol/L (ref 98–111)
Creatinine, Ser: 0.32 mg/dL — ABNORMAL LOW (ref 0.61–1.24)
GFR, Estimated: >60 mL/min (ref >60)
Glucose, Bld: 103 mg/dL — ABNORMAL HIGH (ref 70–99)
Potassium: 3.1 mmol/L — ABNORMAL LOW (ref 3.5–5.1)
Sodium: 140 mmol/L (ref 135–145)
Total Bilirubin: 0.6 mg/dL (ref 0.3–1.2)
Total Protein: 5.4 g/dL — ABNORMAL LOW (ref 6.5–8.1)

## 2020-07-10 LAB — CBC WITH DIFFERENTIAL/PLATELET
Abs Immature Granulocytes: 0.02 10*3/uL (ref 0.00–0.07)
Basophils Absolute: 0 10*3/uL (ref 0.0–0.1)
Basophils Relative: 0 %
Eosinophils Absolute: 0 10*3/uL (ref 0.0–0.5)
Eosinophils Relative: 1 %
HCT: 30.9 % — ABNORMAL LOW (ref 39.0–52.0)
Hemoglobin: 9.2 g/dL — ABNORMAL LOW (ref 13.0–17.0)
Immature Granulocytes: 1 %
Lymphocytes Relative: 16 %
Lymphs Abs: 0.5 10*3/uL — ABNORMAL LOW (ref 0.7–4.0)
MCH: 31 pg (ref 26.0–34.0)
MCHC: 29.8 g/dL — ABNORMAL LOW (ref 30.0–36.0)
MCV: 104 fL — ABNORMAL HIGH (ref 80.0–100.0)
Monocytes Absolute: 0.3 10*3/uL (ref 0.1–1.0)
Monocytes Relative: 9 %
Neutro Abs: 2.4 10*3/uL (ref 1.7–7.7)
Neutrophils Relative %: 73 %
Platelets: 232 10*3/uL (ref 150–400)
RBC: 2.97 MIL/uL — ABNORMAL LOW (ref 4.22–5.81)
RDW: 17.7 % — ABNORMAL HIGH (ref 11.5–15.5)
WBC: 3.3 10*3/uL — ABNORMAL LOW (ref 4.0–10.5)
nRBC: 0 % (ref 0.0–0.2)

## 2020-07-10 LAB — C-REACTIVE PROTEIN: CRP: 4.7 mg/dL — ABNORMAL HIGH (ref <1.0)

## 2020-07-10 LAB — D-DIMER, QUANTITATIVE: D-Dimer, Quant: 2.28 ug/mL-FEU — ABNORMAL HIGH (ref 0.00–0.50)

## 2020-07-10 MED ORDER — POTASSIUM CHLORIDE 20 MEQ PO PACK
40.0000 meq | PACK | Freq: Two times a day (BID) | ORAL | Status: AC
  Administered 2020-07-10 (×2): 40 meq via ORAL
  Filled 2020-07-10 (×2): qty 2

## 2020-07-10 NOTE — Progress Notes (Signed)
[facility] PROGRESS NOTE    Progress Note  Tracy Todd  JIR:[id number] DOB: May 11, 1966 DOA: 06/22/2020 PCP: Eartha Inch, MD     Brief Narrative:   Tracy Todd is an 54 y.o. 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 on IV antibiotics through PICC line complicated by bedbound status and contractures who presents to the ED on 06/22/2020 for fever and tachycardia and cough due to right lower lobe pneumonia, he was started in treatment for sepsis multifactorial from pneumonia and sacral decubitus ulcer on 06/30/2020 patient was transferred to stepdown due to severe hypotension he did require midodrine 3 times a day for short period of time, ID was consulted and started on Ancef and Flagyl for 6 weeks physical therapy evaluated the patient the recommended skilled nursing facility SARS-CoV-2 PCR was positive which have been negative on admission monoclonal antibodies were given 07/06/2020 as he remained asymptomatic.  Assessment/Plan:   COVID-19 virus infection: Tested positive on 07/06/2020 no hypoxia no fever chest x-ray showed faint left lower lobe haziness patient relates no symptoms. He is vaccinated and did receive monoclonal antibodies is currently asymptomatic. His markers have remained basically flat, will stop checking them. He stable to be discharged to skilled nursing facility. He looks good this morning eating and and very joyful mood, he gave me a smile and it seems that he was enjoying his food..  Severe sepsis due to right lower lobe aspiration pneumonia and possibly sacral left trochanteric osteomyelitis chronic: Now resolved he is status post IV fluids and midodrine.  He was started on IV vancomycin and cefepime now transition to Ancef and Flagyl.  Blood cultures continue to remain negative. His last day of antibiotics will be 08/05/2020.  Acute hypoxic respiratory failure due to right  lower lobe pneumonia: Now resolved.  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  Dysphagia: Evaluated by speech they recommended a dysphagia 1 diet.  Loose stools/hypokalemia: Likely due to diarrhea on probiotics now resolved.  Possible sick euthyroid syndrome: Her TSH was 7.4 free T4 1.1, will need to have free T4 and TSH repeated in 6 weeks once antibiotics are completed.  Hypocalcemia: Replete orally now resolved.  Severe protein caloric malnutrition: Continue supplements.  Hyperlipidemia: Continue statins.  Seizure disorder: Continue Keppra.  /Hyponatremia: They resolved with repletion.     Pressure injury of skin   Sepsis (HCC)  RN Pressure Injury Documentation: Pressure Injury 06/23/20 Hip Left Stage 4 - Full thickness tissue loss with exposed bone, tendon or muscle. 3 x 3 cm opened wound with undermining at 3 cm  (Active)  06/23/20 0025  Location: Hip  Location Orientation: Left  Staging: Stage 4 - Full thickness tissue loss with exposed bone, tendon or muscle.  Wound Description (Comments): 3 x 3 cm opened wound with undermining at 3 cm   Present on Admission: Yes     Pressure Injury 06/23/20 Lower;Mid Stage 3 -  Full thickness tissue loss. Subcutaneous fat may be visible but bone, tendon or muscle are NOT exposed. pressure ulcer with undermining (Active)  06/23/20 0025  Location:   Location Orientation: Lower;Mid  Staging: Stage 3 -  Full thickness tissue loss. Subcutaneous fat may be visible but bone, tendon or muscle are NOT exposed.  Wound Description (Comments): pressure ulcer with undermining  Present on Admission: Yes    DVT prophylaxis: lovenox Family  Communication:none Status is: Inpatient  Remains inpatient appropriate because:Hemodynamically unstable and Unsafe d/c plan   Dispo:  Patient From: Home  Planned  Disposition: Skilled Nursing Facility  Expected discharge date: 07/10/20  Medically stable for discharge: Patient is medically stable for discharge awaiting skilled nursing facility placement.        Code Status:     Code Status Orders  (From admission, onward)         Start     Ordered   07/01/20 1036  Do not attempt resuscitation (DNR)  Continuous       Question Answer Comment  In the event of cardiac or respiratory ARREST Do not call a "code blue"   In the event of cardiac or respiratory ARREST Do not perform Intubation, CPR, defibrillation or ACLS   In the event of cardiac or respiratory ARREST Use medication by any route, position, wound care, and other measures to relive pain and suffering. May use oxygen, suction and manual treatment of airway obstruction as needed for comfort.   Comments DNR / DNI      07/01/20 1035        Code Status History    Date Active Date Inactive Code Status Order ID Comments User Context   06/23/2020 0019 07/01/2020 1035 Full Code [id number]  Lilyan Gilford, DO Inpatient   02/05/2020 1723 02/14/2020 0054 Full Code [id number]  Pennie Banter, DO ED   02/05/2020 1721 02/05/2020 1723 DNR [id number]  Pennie Banter, DO ED   12/03/2019 1555 12/07/2019 0337 Full Code [id number]  Jerald Kief, MD Inpatient   11/29/2019 1511 12/03/2019 1554 DNR [id number]  Jonah Blue, MD ED   09/16/2019 1637 09/17/2019 1800 Full Code [id number]  Burna Cash, MD ED   Advance Care Planning Activity    Advance Directive Documentation     Most Recent Value  Type of Advance Directive Healthcare Power of Attorney  Pre-existing out of facility DNR order (yellow form or pink MOST form) --  "MOST" Form in Place? --        IV Access:    Peripheral IV   Procedures and diagnostic studies:   No results found.   Medical Consultants:    None.  Anti-Infectives:   Ancef and flagyl  Subjective:    Tracy Todd was  joyful this morning and  smiling.  Objective:    Vitals:   07/09/20 0440 07/09/20 1603 07/09/20 2049 07/10/20 0414  BP: 101/70 100/66 105/70 96/77  Pulse: 66 77 80 64  Resp: 20  16 16   Temp: 98.1 F (36.7 C) 97.9 F (36.6 C) 98 F (36.7 C) 97.8 F (36.6 C)  TempSrc:  Axillary Axillary Axillary  SpO2:  95% 95% 98%  Weight:      Height:       SpO2: 98 % O2 Flow Rate (L/min): 2 L/min   Intake/Output Summary (Last 24 hours) at 07/10/2020 0859 Last data filed at 07/09/2020 2045 Gross per 24 hour  Intake 240 ml  Output 1275 ml  Net -1035 ml   Filed Weights   06/22/20 1713 07/01/20 0000  Weight: 40.8 kg (P) 45.4 kg    Exam: General exam: In no acute distress. Respiratory system: Good air movement and clear to auscultation. Cardiovascular system: S1 & S2 heard, RRR. No JVD. Gastrointestinal system: Abdomen is nondistended, soft and nontender.  Extremities: No pedal edema. Skin: No rashes, lesions or ulcers   Data Reviewed:    Labs: Basic Metabolic Panel: Recent Labs  Lab 07/04/20 0500 07/05/20 0615 07/06/20 0705 07/06/20 0705 07/07/20 0410 07/07/20 0410 07/08/20 0500 07/08/20 0500 07/09/20 0430 07/10/20 0230  NA 139   < > 139  --  139  --  138  --  138 140  K 3.9   < > 4.1   < > 3.5   < > 4.0   < > 3.4* 3.1*  CL 106   < > 104  --  104  --  105  --  101 104  CO2 24   < > 30  --  28  --  29  --  27 28  GLUCOSE 95   < > 125*  --  95  --  95  --  101* 103*  BUN 11   < > 14  --  16  --  18  --  8 10  CREATININE 0.43*   < > 0.44*  --  0.37*  --  0.44*  --  0.42* 0.32*  CALCIUM 7.4*   < > 7.8*  --  7.8*  --  7.7*  --  7.8* 7.8*  MG 1.8  --  2.0  --   --   --   --   --   --   --    < > = values in this interval not displayed.   GFR Estimated Creatinine Clearance: 60.2 mL/min (A) (by C-G formula based on SCr of 0.32 mg/dL (L)). Liver Function Tests: Recent Labs  Lab 07/05/20 0615 07/07/20 0410 07/08/20 0500 07/09/20 0430 07/10/20 0230  AST 13* 13*  ALT <5 5    ALKPHOS 47 45 46 45 47  BILITOT 0.2* 0.3 0.3 0.3 0.6  PROT 5.1* 5.5* 5.4* 5.5* 5.4*  ALBUMIN 2.1* 2.1* 2.1* 2.2* 2.2*   No results for input(s): LIPASE, AMYLASE in the last 168 hours. No results for input(s): AMMONIA in the last 168 hours. Coagulation profile No results for input(s): INR, PROTIME in the last 168 hours. COVID-19 Labs  Recent Labs    07/08/20 0500 07/09/20 0430 07/10/20 0230  DDIMER 2.28* 1.82* 2.28*  CRP 1.1* 2.5* 4.7*    Lab Results  Component Value Date   SARSCOV2NAA POSITIVE (A) 07/06/2020   SARSCOV2NAA POSITIVE (A) 07/06/2020   SARSCOV2NAA NEGATIVE 06/22/2020   SARSCOV2NAA NEGATIVE 02/12/2020    CBC: Recent Labs  Lab 07/07/20 0410 07/08/20 0500 07/09/20 0430 07/10/20 0230  WBC 3.4* 3.7* 7.1 3.3*  NEUTROABS 2.6 2.8 6.3 2.4  HGB 8.8* 9.5* 9.6* 9.2*  HCT 29.7* 31.4* 32.0* 30.9*  MCV 103.8* 104.3* 103.2* 104.0*  PLT 236 256 263 232   Cardiac Enzymes: No results for input(s): CKTOTAL, CKMB, CKMBINDEX, TROPONINI in the last 168 hours. BNP (last 3 results) No results for input(s): PROBNP in the last 8760 hours. CBG: Recent Labs  Lab 07/05/20 1723  GLUCAP 124*   D-Dimer: Recent Labs    07/09/20 0430 07/10/20 0230  DDIMER 1.82* 2.28*   Hgb A1c: No results for input(s): HGBA1C in the last 72 hours. Lipid Profile: No results for input(s): CHOL, HDL, LDLCALC, TRIG, CHOLHDL, LDLDIRECT in the last 72 hours. Thyroid function studies: No results for input(s): TSH, T4TOTAL, T3FREE, THYROIDAB in the last 72 hours.  Invalid input(s): FREET3 Anemia work up: No results for input(s): VITAMINB12, FOLATE, FERRITIN, TIBC, IRON, RETICCTPCT in the last 72 hours. Sepsis Labs: Recent Labs  Lab 07/07/20 0410 07/08/20 0500 07/09/20 0430 07/10/20 0230  WBC 3.4* 3.7* 7.1 3.3*  Microbiology Recent Results (from the past 240 hour(s))  Culture, blood (routine x 2)     Status: None   Collection Time: 06/30/20  6:02 PM   Specimen: BLOOD LEFT HAND   Result Value Ref Range Status   Specimen Description   Final    BLOOD LEFT HAND Performed at [facility], [address] [place], [place] [address]    Special Requests   Final    BOTTLES DRAWN AEROBIC ONLY Blood Culture results may not be optimal due to an inadequate volume of blood received in culture bottles Performed at [facility], [address] [place], [place] [address]    Culture   Final    NO GROWTH 5 DAYS Performed at [facility], [address] [place], [place] [address]    Report Status 07/05/2020 FINAL  Final  Culture, blood (routine x 2)     Status: None   Collection Time: 06/30/20  6:02 PM   Specimen: BLOOD LEFT FOREARM  Result Value Ref Range Status   Specimen Description   Final    BLOOD LEFT FOREARM Performed at [facility], [address] [place], [place] [address]    Special Requests   Final    BOTTLES DRAWN AEROBIC ONLY Blood Culture adequate volume Performed at [facility], [address] [place], [place] [address]    Culture   Final    NO GROWTH 5 DAYS Performed at [facility], [address] [place], [place] [address]    Report Status 07/05/2020 FINAL  Final  Respiratory Panel by RT PCR (Flu A&B, Covid) - Nasopharyngeal Swab     Status: Abnormal   Collection Time: 07/06/20 11:32 AM   Specimen: Nasopharyngeal Swab  Result Value Ref Range Status   SARS Coronavirus 2 by RT PCR POSITIVE (A) NEGATIVE Final    Comment: RESULT CALLED TO, READ BACK BY AND VERIFIED WITH: GIRLEY,K. RN @1414  07/06/20 BILLINGSLEY,L (NOTE) SARS-CoV-2 target nucleic acids are DETECTED.  SARS-CoV-2 RNA is generally detectable in upper respiratory specimens  during the acute phase of infection. Positive results are indicative of the presence of the identified virus, but do not rule out bacterial infection or co-infection with other pathogens not detected by the test. Clinical  correlation with patient history and other diagnostic information is necessary to determine patient infection status. The expected result is Negative.  Fact Sheet for Patients:  https://www.moore.com/https://www.fda.gov/media/142436/download  Fact Sheet for Healthcare Providers: https://www.young.biz/https://www.fda.gov/media/142435/download  This test is not yet approved or cleared by the [place] FDA and  has been authorized for detection and/or diagnosis of SARS-CoV-2 by FDA under an Emergency Use Authorization (EUA).  This EUA will remain in effect (meaning this test  can be used) for the duration of  the COVID-19 declaration under Section 564(b)(1) of the Act, 21 U.S.C. section 360bbb-3(b)(1), unless the authorization is terminated or revoked sooner.      Influenza A by PCR NEGATIVE NEGATIVE Final   Influenza B by PCR NEGATIVE NEGATIVE Final    Comment: (NOTE) The Xpert Xpress SARS-CoV-2/FLU/RSV assay is intended as an aid in  the diagnosis of influenza from Nasopharyngeal swab specimens and  should not be used as a sole basis for treatment. Nasal washings and  aspirates are unacceptable for Xpert Xpress SARS-CoV-2/FLU/RSV  testing.  Fact Sheet for Patients: https://www.moore.com/https://www.fda.gov/media/142436/download  Fact Sheet for Healthcare Providers: https://www.young.biz/https://www.fda.gov/media/142435/download  This test is not yet approved or cleared by the [place] FDA  and  has been authorized for detection and/or diagnosis of SARS-CoV-2 by  FDA under an Emergency Use Authorization (EUA). This EUA will remain  in effect (meaning this test can be used) for the duration of the  Covid-19 declaration under Section 564(b)(1) of the Act, 21  U.S.C. section 360bbb-3(b)(1), unless the authorization is  terminated or revoked. Performed at [facility], [address] [place], [place] [address]   SARS Coronavirus 2 by RT PCR (hospital order, performed in [facility] hospital lab) Nasopharyngeal Nasopharyngeal Swab      Status: Abnormal   Collection Time: 07/06/20  3:20 PM   Specimen: Nasopharyngeal Swab  Result Value Ref Range Status   SARS Coronavirus 2 POSITIVE (A) NEGATIVE Final    Comment: CRITICAL RESULT CALLED TO, READ BACK BY AND VERIFIED WITH: WILLIAMS,B. RN @1735  07/06/20 BILLINGSLEY,L (NOTE) SARS-CoV-2 target nucleic acids are DETECTED  SARS-CoV-2 RNA is generally detectable in upper respiratory specimens  during the acute phase of infection.  Positive results are indicative  of the presence of the identified virus, but do not rule out bacterial infection or co-infection with other pathogens not detected by the test.  Clinical correlation with patient history and  other diagnostic information is necessary to determine patient infection status.  The expected result is negative.  Fact Sheet for Patients:   07/08/20   Fact Sheet for Healthcare Providers:   BoilerBrush.com.cy    This test is not yet approved or cleared by the https://pope.com/ FDA and  has been authorized for detection and/or diagnosis of SARS-CoV-2 by FDA under an Emergency Use Authorization (EUA).  This EUA will remain in effec t (meaning this test can be used) for the duration of  the COVID-19 declaration under Section 564(b)(1) of the Act, 21 U.S.C. section 360-bbb-3(b)(1), unless the authorization is terminated or revoked sooner.  Performed at [facility], [address], [place], [place]      Medications:   . vitamin C  500 mg Oral Daily  . aspirin  81 mg Oral Daily  . Chlorhexidine Gluconate Cloth  6 each Topical Daily  . cholecalciferol  1,000 Units Oral Daily  . feeding supplement  237 mL Oral BID BM  . feeding supplement (PRO-STAT SUGAR FREE 64)  30 mL Oral BID  . finasteride  5 mg Oral Daily  . heparin  5,000 Units Subcutaneous Q8H  . Ipratropium-Albuterol  1 puff Inhalation Q6H  . iron polysaccharides  150 mg Oral  Daily  . levETIRAcetam  500 mg Oral BID  . loratadine  10 mg Oral Daily  . mouth rinse  15 mL Mouth Rinse BID  . metoprolol tartrate  12.5 mg Oral BID  . metroNIDAZOLE  500 mg Oral Q8H  . nutrition supplement (JUVEN)  1 packet Oral BID BM  . pravastatin  40 mg Oral Daily  . saccharomyces boulardii  250 mg Oral BID  . selenium sulfide   Topical Daily  . sodium chloride flush  10-40 mL Intracatheter Q12H  . vitamin B-12  500 mcg Oral Daily  . zinc sulfate  220 mg Oral Daily   Continuous Infusions: .  ceFAZolin (ANCEF) IV 2 g (07/10/20 0550)      LOS: 18 days   07/12/20  [facility]  07/10/2020, 8:59 AM

## 2020-07-10 NOTE — TOC Progression Note (Addendum)
Transition of Care [facility]) - Progression Note    Patient Details  Name: Micah Barnier MRN: [id number] Date of Birth: 1966-03-23  Transition [facility]) CM/SW Contact  Ida Rogue, [place] Phone Number: 07/10/2020, 11:52 AM  Clinical Narrative:   [facility] rescinded bed offer, stating they were unaware that patient was COVID positive, and they have no isolation beds for two weeks.  Let sister know. MD alerted.  Contacted Star with [facility], who had previously declined patient as he is not a good candidate for rehab.  I explained that he is in need of placement for IV abx and wound care, and that he will return to [facility] upon d/c from their facility.  They are reviewing. TOC will continue to follow during the course of hospitalization.  Addendum:  [facility] has also declined, stating he is not a good candidate for rehab.  Apparently, they get reimbursed for PT/OT sessions when patients are meaningfully participating.  If that is not happening, there is no reimbursement.    I have reached out to coworkers and my supervisor to determine next steps.    Expected Discharge Plan: Skilled Nursing Facility Barriers to Discharge: SNF Pending bed offer  Expected Discharge Plan and Services Expected Discharge Plan: Skilled Nursing Facility       Living arrangements for the past 2 months: Single Family Home Expected Discharge Date: 07/09/20                                     Social Determinants of Health (SDOH) Interventions    Readmission Risk Interventions No flowsheet data found.

## 2020-07-10 NOTE — Progress Notes (Signed)
This RN called patient's sister, Misty Stanley, with an update regarding patient. Sister gave suggestions of ways to comfort patient including TV shows such as sports, bonanza, or western shows. This RN assured her that we would try to help patient feel comforted and safe while in our care.

## 2020-07-11 DIAGNOSIS — E870 Hyperosmolality and hypernatremia: Secondary | ICD-10-CM | POA: Diagnosis not present

## 2020-07-11 DIAGNOSIS — M8600 Acute hematogenous osteomyelitis, unspecified site: Secondary | ICD-10-CM | POA: Diagnosis not present

## 2020-07-11 DIAGNOSIS — U071 COVID-19: Secondary | ICD-10-CM | POA: Diagnosis not present

## 2020-07-11 NOTE — TOC Progression Note (Signed)
Transition of Care [facility]) - Progression Note    Patient Details  Name: Tracy Todd MRN: [id number] Date of Birth: Aug 17, 1966  Transition of Care [facility]) CM/SW Contact  Carmina Miller, LCSWA Phone Number: 07/11/2020, 1:16 PM  Clinical Narrative:    CSW reached out to [facility] to see if there was availability for pt, there was no answer. CSW reached out to [facility], spoke with Okey Regal who requested clinicals to be faxed to [phone]. CSW faxed clinicals. Okey Regal stated due to pt's wound care issues, clinicals will have to be reviewed by DON. CSW will need to reach out to [facility] at [phone] on Monday to check status of admission.    Expected Discharge Plan: Skilled Nursing Facility Barriers to Discharge: SNF Pending bed offer  Expected Discharge Plan and Services Expected Discharge Plan: Skilled Nursing Facility       Living arrangements for the past 2 months: Single Family Home Expected Discharge Date: 07/09/20                                     Social Determinants of Health (SDOH) Interventions    Readmission Risk Interventions No flowsheet data found.

## 2020-07-11 NOTE — Progress Notes (Signed)
[facility] PROGRESS NOTE    Progress Note  Tracy Todd  QBH:[id number] DOB: [birth year] DOA: 06/22/2020 PCP: Eartha Inch, MD     Brief Narrative:   Tracy Todd is an 54 y.o. 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 on IV antibiotics through PICC line complicated by bedbound status and contractures who presents to the ED on 06/22/2020 for fever and tachycardia and cough due to right lower lobe pneumonia, he was started in treatment for sepsis multifactorial from pneumonia and sacral decubitus ulcer on 06/30/2020 patient was transferred to stepdown due to severe hypotension he did require midodrine 3 times a day for short period of time, ID was consulted and started on Ancef and Flagyl for 6 weeks physical therapy evaluated the patient the recommended skilled nursing facility SARS-CoV-2 PCR was positive which have been negative on admission monoclonal antibodies were given 07/06/2020 as he remained asymptomatic.  Assessment/Plan:   COVID-19 virus infection: Tested positive on 07/06/2020 no hypoxia no fever chest x-ray showed faint left lower lobe haziness patient relates no symptoms. He is vaccinated and did receive monoclonal antibodies is currently asymptomatic. His markers have remained basically flat, will stop checking them. He stable to be discharged to skilled nursing facility. Awaiting skilled nursing facility placement.  Severe sepsis due to right lower lobe aspiration pneumonia and possibly sacral left trochanteric osteomyelitis chronic: Now resolved he is status post IV fluids and midodrine.  He was started on IV vancomycin and cefepime now transition to Ancef and Flagyl.  Blood cultures continue to remain negative. His last day of antibiotics will be 08/05/2020.  Acute hypoxic respiratory failure due to right lower lobe pneumonia: Now resolved.  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  Dysphagia: Evaluated by speech they recommended a dysphagia 1 diet.  Loose stools/hypokalemia: Likely due to diarrhea on probiotics now resolved.  Possible sick euthyroid syndrome: Her TSH was 7.4 free T4 1.1, will need to have free T4 and TSH repeated in 6 weeks once antibiotics are completed.  Hypocalcemia: Replete orally now resolved.  Severe protein caloric malnutrition: Continue supplements.  Hyperlipidemia: Continue statins.  Seizure disorder: Continue Keppra.  /Hyponatremia: They resolved with repletion.     Pressure injury of skin   Sepsis (HCC)  RN Pressure Injury Documentation: Pressure Injury 06/23/20 Hip Left Stage 4 - Full thickness tissue loss with exposed bone, tendon or muscle. 3 x 3 cm opened wound with undermining at 3 cm  (Active)  06/23/20 0025  Location: Hip  Location Orientation: Left  Staging: Stage 4 - Full thickness tissue loss with exposed bone, tendon or muscle.  Wound Description (Comments): 3 x 3 cm opened wound with undermining at 3 cm   Present on Admission: Yes     Pressure Injury 06/23/20 Lower;Mid Stage 3 -  Full thickness tissue loss. Subcutaneous fat may be visible but bone, tendon or muscle are NOT exposed. pressure ulcer with undermining (Active)  06/23/20 0025  Location:   Location Orientation: Lower;Mid  Staging: Stage 3 -  Full thickness tissue loss. Subcutaneous fat may be visible but bone, tendon or muscle are NOT exposed.  Wound Description (Comments): pressure ulcer with undermining  Present on Admission: Yes    DVT prophylaxis: lovenox Family Communication:none Status is: Inpatient  Remains inpatient appropriate because:Hemodynamically unstable and Unsafe d/c plan   Dispo:  Patient From:  Home  Planned Disposition: Skilled Nursing Facility  Expected discharge date: 07/10/20  Medically  stable for discharge: Patient is medically stable for discharge awaiting skilled nursing facility placement.        Code Status:     Code Status Orders  (From admission, onward)         Start     Ordered   07/01/20 1036  Do not attempt resuscitation (DNR)  Continuous       Question Answer Comment  In the event of cardiac or respiratory ARREST Do not call a "code blue"   In the event of cardiac or respiratory ARREST Do not perform Intubation, CPR, defibrillation or ACLS   In the event of cardiac or respiratory ARREST Use medication by any route, position, wound care, and other measures to relive pain and suffering. May use oxygen, suction and manual treatment of airway obstruction as needed for comfort.   Comments DNR / DNI      07/01/20 1035        Code Status History    Date Active Date Inactive Code Status Order ID Comments User Context   06/23/2020 0019 07/01/2020 1035 Full Code [id number]  Lilyan Gilford, DO Inpatient   02/05/2020 1723 02/14/2020 0054 Full Code [id number]  Pennie Banter, DO ED   02/05/2020 1721 02/05/2020 1723 DNR [id number]  Pennie Banter, DO ED   12/03/2019 1555 12/07/2019 0337 Full Code [id number]  Jerald Kief, MD Inpatient   11/29/2019 1511 12/03/2019 1554 DNR [id number]  Jonah Blue, MD ED   09/16/2019 1637 09/17/2019 1800 Full Code [id number]  Burna Cash, MD ED   Advance Care Planning Activity    Advance Directive Documentation     Most Recent Value  Type of Advance Directive Healthcare Power of Attorney  Pre-existing out of facility DNR order (yellow form or pink MOST form) --  "MOST" Form in Place? --        IV Access:    Peripheral IV   Procedures and diagnostic studies:   No results found.   Medical Consultants:    None.  Anti-Infectives:   Ancef and flagyl  Subjective:    Tracy Todd was  joyful this morning and smiling.  Objective:    Vitals:   07/10/20 0900 07/10/20 1605 07/10/20 2152  07/11/20 0548  BP: 99/69 (!) 95/51 (!) 93/55 (!) 87/46  Pulse: 63 75 74 (!) 56  Resp:  18 16 16   Temp:  98.4 F (36.9 C) 97.9 F (36.6 C) (!) 97.4 F (36.3 C)  TempSrc:   Axillary Axillary  SpO2:  (!) 86% 98% 92%  Weight:      Height:       SpO2: 92 % O2 Flow Rate (L/min): 2 L/min   Intake/Output Summary (Last 24 hours) at 07/11/2020 0701 Last data filed at 07/11/2020 0400 Gross per 24 hour  Intake 600 ml  Output 1425 ml  Net -825 ml   Filed Weights   06/22/20 1713 07/01/20 0000  Weight: 40.8 kg (P) 45.4 kg    Exam: General exam: In no acute distress. Respiratory system: Good air movement and clear to auscultation. Cardiovascular system: S1 & S2 heard, RRR. No JVD. Gastrointestinal system: Abdomen is nondistended, soft and nontender.  Extremities: No pedal edema. Skin: No rashes, lesions or ulcers   Data Reviewed:    Labs: Basic Metabolic Panel: Recent Labs  Lab 07/06/20 0705 07/06/20 0705 07/07/20 0410 07/07/20 0410 07/08/20 0500 07/08/20 0500 07/09/20 0430 07/10/20 0230  NA 139  --  139  --  138  --  138 140  K 4.1   < > 3.5   < > 4.0   < > 3.4* 3.1*  CL 104  --  104  --  105  --  101 104  CO2 30  --  28  --  29  --  27 28  GLUCOSE 125*  --  95  --  95  --  101* 103*  BUN 14  --  16  --  18  --  8 10  CREATININE 0.44*  --  0.37*  --  0.44*  --  0.42* 0.32*  CALCIUM 7.8*  --  7.8*  --  7.7*  --  7.8* 7.8*  MG 2.0  --   --   --   --   --   --   --    < > = values in this interval not displayed.   GFR Estimated Creatinine Clearance: 60.2 mL/min (A) (by C-G formula based on SCr of 0.32 mg/dL (L)). Liver Function Tests: Recent Labs  Lab 07/05/20 0615 07/07/20 0410 07/08/20 0500 07/09/20 0430 07/10/20 0230  AST 23 16 15  13* 13*  ALT 10 6 5  <5 5  ALKPHOS 47 45 46 45 47  BILITOT 0.2* 0.3 0.3 0.3 0.6  PROT 5.1* 5.5* 5.4* 5.5* 5.4*  ALBUMIN 2.1* 2.1* 2.1* 2.2* 2.2*   No results for input(s): LIPASE, AMYLASE in the last 168 hours. No results  for input(s): AMMONIA in the last 168 hours. Coagulation profile No results for input(s): INR, PROTIME in the last 168 hours. COVID-19 Labs  Recent Labs    07/09/20 0430 07/10/20 0230  DDIMER 1.82* 2.28*  CRP 2.5* 4.7*    Lab Results  Component Value Date   SARSCOV2NAA POSITIVE (A) 07/06/2020   SARSCOV2NAA POSITIVE (A) 07/06/2020   SARSCOV2NAA NEGATIVE 06/22/2020   SARSCOV2NAA NEGATIVE 02/12/2020    CBC: Recent Labs  Lab 07/07/20 0410 07/08/20 0500 07/09/20 0430 07/10/20 0230  WBC 3.4* 3.7* 7.1 3.3*  NEUTROABS 2.6 2.8 6.3 2.4  HGB 8.8* 9.5* 9.6* 9.2*  HCT 29.7* 31.4* 32.0* 30.9*  MCV 103.8* 104.3* 103.2* 104.0*  PLT 236 256 263 232   Cardiac Enzymes: No results for input(s): CKTOTAL, CKMB, CKMBINDEX, TROPONINI in the last 168 hours. BNP (last 3 results) No results for input(s): PROBNP in the last 8760 hours. CBG: Recent Labs  Lab 07/05/20 1723  GLUCAP 124*   D-Dimer: Recent Labs    07/09/20 0430 07/10/20 0230  DDIMER 1.82* 2.28*   Hgb A1c: No results for input(s): HGBA1C in the last 72 hours. Lipid Profile: No results for input(s): CHOL, HDL, LDLCALC, TRIG, CHOLHDL, LDLDIRECT in the last 72 hours. Thyroid function studies: No results for input(s): TSH, T4TOTAL, T3FREE, THYROIDAB in the last 72 hours.  Invalid input(s): FREET3 Anemia work up: No results for input(s): VITAMINB12, FOLATE, FERRITIN, TIBC, IRON, RETICCTPCT in the last 72 hours. Sepsis Labs: Recent Labs  Lab 07/07/20 0410 07/08/20 0500 07/09/20 0430 07/10/20 0230  WBC 3.4* 3.7* 7.1 3.3*   Microbiology Recent Results (from the past 240 hour(s))  Respiratory Panel by RT PCR (Flu A&B, Covid) - Nasopharyngeal Swab     Status: Abnormal   Collection Time: 07/06/20 11:32 AM   Specimen: Nasopharyngeal Swab  Result Value Ref Range Status   SARS Coronavirus 2 by RT PCR POSITIVE (A) NEGATIVE Final    Comment: RESULT CALLED TO, READ BACK BY AND VERIFIED WITH:  GIRLEY,K. RN @1414  07/06/20  BILLINGSLEY,L (NOTE) SARS-CoV-2 target nucleic acids are DETECTED.  SARS-CoV-2 RNA is generally detectable in upper respiratory specimens  during the acute phase of infection. Positive results are indicative of the presence of the identified virus, but do not rule out bacterial infection or co-infection with other pathogens not detected by the test. Clinical correlation with patient history and other diagnostic information is necessary to determine patient infection status. The expected result is Negative.  Fact Sheet for Patients:  https://www.moore.com/https://www.fda.gov/media/142436/download  Fact Sheet for Healthcare Providers: https://www.young.biz/https://www.fda.gov/media/142435/download  This test is not yet approved or cleared by the [place] FDA and  has been authorized for detection and/or diagnosis of SARS-CoV-2 by FDA under an Emergency Use Authorization (EUA).  This EUA will remain in effect (meaning this test  can be used) for the duration of  the COVID-19 declaration under Section 564(b)(1) of the Act, 21 U.S.C. section 360bbb-3(b)(1), unless the authorization is terminated or revoked sooner.      Influenza A by PCR NEGATIVE NEGATIVE Final   Influenza B by PCR NEGATIVE NEGATIVE Final    Comment: (NOTE) The Xpert Xpress SARS-CoV-2/FLU/RSV assay is intended as an aid in  the diagnosis of influenza from Nasopharyngeal swab specimens and  should not be used as a sole basis for treatment. Nasal washings and  aspirates are unacceptable for Xpert Xpress SARS-CoV-2/FLU/RSV  testing.  Fact Sheet for Patients: https://www.moore.com/https://www.fda.gov/media/142436/download  Fact Sheet for Healthcare Providers: https://www.young.biz/https://www.fda.gov/media/142435/download  This test is not yet approved or cleared by the [place] FDA and  has been authorized for detection and/or diagnosis of SARS-CoV-2 by  FDA under an Emergency Use Authorization (EUA). This EUA will remain  in effect (meaning this test can be used) for the duration of the    Covid-19 declaration under Section 564(b)(1) of the Act, 21  U.S.C. section 360bbb-3(b)(1), unless the authorization is  terminated or revoked. Performed at [facility], [address] [place], [place] [address]   SARS Coronavirus 2 by RT PCR (hospital order, performed in [facility] hospital lab) Nasopharyngeal Nasopharyngeal Swab     Status: Abnormal   Collection Time: 07/06/20  3:20 PM   Specimen: Nasopharyngeal Swab  Result Value Ref Range Status   SARS Coronavirus 2 POSITIVE (A) NEGATIVE Final    Comment: CRITICAL RESULT CALLED TO, READ BACK BY AND VERIFIED WITH: WILLIAMS,B. RN @1735  07/06/20 BILLINGSLEY,L (NOTE) SARS-CoV-2 target nucleic acids are DETECTED  SARS-CoV-2 RNA is generally detectable in upper respiratory specimens  during the acute phase of infection.  Positive results are indicative  of the presence of the identified virus, but do not rule out bacterial infection or co-infection with other pathogens not detected by the test.  Clinical correlation with patient history and  other diagnostic information is necessary to determine patient infection status.  The expected result is negative.  Fact Sheet for Patients:   BoilerBrush.com.cyhttps://www.fda.gov/media/136312/download   Fact Sheet for Healthcare Providers:   https://pope.com/https://www.fda.gov/media/136313/download    This test is not yet approved or cleared by the [place] FDA and  has been authorized for detection and/or diagnosis of SARS-CoV-2 by FDA under an Emergency Use Authorization (EUA).  This EUA will remain in effec t (meaning this test can be used) for the duration of  the COVID-19 declaration under Section 564(b)(1) of the Act, 21 U.S.C. section 360-bbb-3(b)(1), unless the authorization is terminated or revoked sooner.  Performed at [facility], [address] [place], [place] [address]  Medications:   . vitamin C  500 mg Oral Daily  . aspirin  81 mg Oral Daily  .  Chlorhexidine Gluconate Cloth  6 each Topical Daily  . cholecalciferol  1,000 Units Oral Daily  . feeding supplement  237 mL Oral BID BM  . feeding supplement (PRO-STAT SUGAR FREE 64)  30 mL Oral BID  . finasteride  5 mg Oral Daily  . heparin  5,000 Units Subcutaneous Q8H  . Ipratropium-Albuterol  1 puff Inhalation Q6H  . iron polysaccharides  150 mg Oral Daily  . levETIRAcetam  500 mg Oral BID  . loratadine  10 mg Oral Daily  . mouth rinse  15 mL Mouth Rinse BID  . metoprolol tartrate  12.5 mg Oral BID  . metroNIDAZOLE  500 mg Oral Q8H  . nutrition supplement (JUVEN)  1 packet Oral BID BM  . pravastatin  40 mg Oral Daily  . saccharomyces boulardii  250 mg Oral BID  . selenium sulfide   Topical Daily  . sodium chloride flush  10-40 mL Intracatheter Q12H  . vitamin B-12  500 mcg Oral Daily  . zinc sulfate  220 mg Oral Daily   Continuous Infusions: .  ceFAZolin (ANCEF) IV 2 g (07/11/20 0526)      LOS: 19 days   Marinda Elk  [facility]  07/11/2020, 7:01 AM

## 2020-07-12 DIAGNOSIS — L89304 Pressure ulcer of unspecified buttock, stage 4: Secondary | ICD-10-CM | POA: Diagnosis not present

## 2020-07-12 DIAGNOSIS — A419 Sepsis, unspecified organism: Secondary | ICD-10-CM | POA: Diagnosis not present

## 2020-07-12 MED ORDER — KETOROLAC TROMETHAMINE 15 MG/ML IJ SOLN
15.0000 mg | Freq: Three times a day (TID) | INTRAMUSCULAR | Status: AC | PRN
  Administered 2020-07-17 (×2): 15 mg via INTRAVENOUS
  Filled 2020-07-12 (×2): qty 1

## 2020-07-12 NOTE — Progress Notes (Signed)
Spoke to Tracy Todd and Tracy Todd, the nursing supervisors on duty regarding visitors for [place]. Both nursing supervisors agreed that due to patient's mental capacity and safety issues, he is to be granted one visitor during his stay. Will contact infection prevention at [phone] per Laurie's request regarding the precautions needed by visitor.

## 2020-07-12 NOTE — Progress Notes (Signed)
Spoke to infection prevention regarding precautions needed for family member to visit patient. Cherrie Steagle recommending gown, surgical mask, eye protection at this time.

## 2020-07-12 NOTE — Progress Notes (Signed)
PROGRESS NOTE  Tracy AmbleKeith Todd  ZOX:[id number] DOB: 08-23-1965 DOA: 06/22/2020 PCP: Eartha InchBadger, Michael C, MD   Brief Narrative: Tracy Todd is a 54 y.o. 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 CT scan of the chest abdomen pelvis showed right lower lobe consolidation with mucoid material on the right lower lobe bronchus. Decubitus ulceration, erosive changes of the sacrum. Patient was treated as sepsis, likely multifactorial, and admitted to the hospital.  In the evening of 06/30/20 patient was transferred to stepdown unit due to severe hypotension, received IV fluid boluses.  Due to persistent hypotension he was started on midodrine 10 mg 3 times daily on 07/02/2020, held on 07/04/20 AM.  Seen by ID, Dr. Luciana Axeomer, who recommended continued ancef and po flagyl, plan for 6 weeks, end date 08/05/20. Hypotension had resolved and he was felt stable for discharge to SNF based on PT recommendations. Screening covid PCR in this setting was positive and confirmed (had been negative at admission). Since there were no respiratory symptoms or hypoxia, after discussing with his sister who is HCPOA, monoclonal antibody was administered 11/15. Fortunately the patient has remained asymptomatic. Discharge to SNF is planned 07/13/2020 pending bed availability.  Assessment & Plan: Active Problems:   Pressure injury of skin   Sepsis (HCC)  Covid-19 infection: Negative at admission, then positive screening test 11/15.  - Consistent with true positive testing as it was repeated and cycle time was 30. Pt was vaccinated w/Moderna 09/02/2019 and 09/30/2019.  - s/p monoclonal antibody 07/06/2020 for incidental covid.  - If patient remains asymptomatic/mildly symptomatic, isolation period would  be 10 days from positive test.   Severe sepsis due to RLL aspiration pneumonia and chronic sacral and left trochanteric osteomyelitis: POA, resolved.  - Sepsis resolved, s/p IVF and midodrine, received cefepime and vancomycin for several days in addition to otherwise getting ancef, flagyl which will continue.  - Blood cultures 11/9 negative.   Acute hypoxic respiratory failure due to RLL pneumonia, atelectasis:  - Resolved.   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  Dysphagia: Evaluated by SLP w/MBSS, moderate aspiration risk. - Continue dysphagia 1 diet, aspiration precautions, feeding assistance.   Loose stools/hypokalemia from GI losses: Benign abd exam.  - Continue probiotic  Subclinical hypothyroidism in the setting of acute illness: TSH 7.729, free T4 1.17. ?if multivitamin at home has biotin interfering with assay.  - Not currently symptomatically hypo or hyperthyroid. - Repeat TSH, T3, T4 in 6 weeks once complete IV antibiotics.   Hypocalcemia: Resolved with replacement when corrected for hypoalbuminemia.   Hypotension: Resolved  Severe protein calorie malnutrition:  - Supplement protein as able.   Hyperlipidemia:  - Continue statin. LFTs wnl.  Seizure disorder:  - Continue home keppra and seizure precautions.   Trisomy 21   Macrocytic anemia: Folic acid and B12 wnl. No evidence of bleeding.  - Monitor CBC in AM  Hypernatremia: Resolved with isotonic IVF.  - Encourage free water by mouth.  Hypokalemia: Improved.  - Continue to supplement as needed.  PVCs, NSVT: No sustained VT.  - Started low dose metoprolol (hypotension improved) and monitor closely  - Continue regular Mg and  K supplementation.   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 3 x 3 cm opened wound with undermining at 3 cm  (Active)  06/23/20 0025  Location: Hip  Location Orientation: Left  Staging: Stage 4 - Full thickness tissue loss with exposed bone, tendon or muscle.  Wound Description (Comments): 3 x 3 cm opened wound with undermining at 3 cm   Present on Admission: Yes     Pressure Injury 06/23/20 Lower;Mid Stage 4 - Full thickness tissue loss with exposed bone, tendon or muscle. pressure ulcer with undermining (Active)  06/23/20 0025  Location:   Location Orientation: Lower;Mid  Staging: Stage 4 - Full thickness tissue loss with exposed bone, tendon or muscle.  Wound Description (Comments): pressure ulcer with undermining  Present on Admission: Yes  - Optimize wound healing by offloading, optimize nutritional status, treat infections.  DVT prophylaxis: Heparin 5k units q8h Code Status: DNR Family Communication: None at bedside, will call with any changes in status or 07/13/2020. Disposition Plan:  Status is: Inpatient  Remains inpatient appropriate because:Unsafe d/c plan (no SNF bed available)  Dispo:  Patient From: Home  Planned Disposition: Skilled Nursing Facility  Expected discharge date: 07/10/20  Medically stable for discharge: Yes  Consultants:   ID  IR  Procedures:   PICC line placement  Antimicrobials:  Vancomycin, cefepime  Ancef, flagyl ongoing   Subjective: No overnight events, pt nonverbal.  Objective: Vitals:   07/11/20 2036 07/12/20 0427 07/12/20 1100 07/12/20 1328  BP: 93/75 103/86 104/82 111/68  Pulse: 72 81 78 77  Resp: 16 15    Temp: (!) 97.4 F (36.3 C) (!) 97.4 F (36.3 C)  (!) 97.3 F (36.3 C)  TempSrc: Axillary   Axillary  SpO2: 100% 99%  99%  Weight:      Height:        Intake/Output Summary (Last 24 hours) at 07/12/2020 1446 Last data filed at 07/11/2020 2244 Gross per 24 hour  Intake 238 ml  Output 1300 ml  Net -1062 ml   Filed  Weights   06/22/20 1713 07/01/20 0000  Weight: 40.8 kg (P) 45.4 kg   Gen: 54 y.o. male in no distress Pulm: Nonlabored breathing room air. Clear. CV: Regular rate and rhythm. No murmur, rub, or gallop. No JVD, no dependent edema. GI: Abdomen soft, non-tender, non-distended, with normoactive bowel sounds.  Ext: Warm, well-perfused Skin: No new rashes, lesions or ulcers on visualized skin. Neuro: Alert and calm, follows some commands. Spastic contractures noted.  Psych: UTD, makes good eye contact, waves upon my entering room.   Data Reviewed: I have personally reviewed following labs and imaging studies  CBC: Recent Labs  Lab 07/07/20 0410 07/08/20 0500 07/09/20 0430 07/10/20 0230  WBC 3.4* 3.7* 7.1 3.3*  NEUTROABS 2.6 2.8 6.3 2.4  HGB 8.8* 9.5* 9.6* 9.2*  HCT 29.7* 31.4* 32.0* 30.9*  MCV 103.8* 104.3* 103.2* 104.0*  PLT 236 256 263 232   Basic Metabolic Panel: Recent Labs  Lab 07/06/20 0705 07/07/20 0410 07/08/20 0500 07/09/20 0430 07/10/20 0230  NA 139 139 138 138 140  K 4.1 3.5 4.0 3.4* 3.1*  CL 104 104 105 101 104  CO2 30 28 29 27 28   GLUCOSE 125* 95 95 101* 103*  BUN 14 16 18 8 10   CREATININE 0.44* 0.37* 0.44* 0.42* 0.32*  CALCIUM 7.8* 7.8* 7.7* 7.8* 7.8*  MG 2.0  --   --   --   --  GFR: Estimated Creatinine Clearance: 60.2 mL/min (A) (by C-G formula based on SCr of 0.32 mg/dL (L)). Liver Function Tests: Recent Labs  Lab 07/07/20 0410 07/08/20 0500 07/09/20 0430 07/10/20 0230  AST 16 15 13* 13*  ALT 6 5 <5 5  ALKPHOS 45 46 45 47  BILITOT 0.3 0.3 0.3 0.6  PROT 5.5* 5.4* 5.5* 5.4*  ALBUMIN 2.1* 2.1* 2.2* 2.2*   No results for input(s): LIPASE, AMYLASE in the last 168 hours. No results for input(s): AMMONIA in the last 168 hours. Coagulation Profile: No results for input(s): INR, PROTIME in the last 168 hours. Cardiac Enzymes: No results for input(s): CKTOTAL, CKMB, CKMBINDEX, TROPONINI in the last 168 hours. BNP (last 3 results) No results for  input(s): PROBNP in the last 8760 hours. HbA1C: No results for input(s): HGBA1C in the last 72 hours. CBG: Recent Labs  Lab 07/05/20 1723  GLUCAP 124*   Lipid Profile: No results for input(s): CHOL, HDL, LDLCALC, TRIG, CHOLHDL, LDLDIRECT in the last 72 hours. Thyroid Function Tests: No results for input(s): TSH, T4TOTAL, FREET4, T3FREE, THYROIDAB in the last 72 hours. Anemia Panel: No results for input(s): VITAMINB12, FOLATE, FERRITIN, TIBC, IRON, RETICCTPCT in the last 72 hours. Urine analysis:    Component Value Date/Time   COLORURINE YELLOW 06/22/2020 2224   APPEARANCEUR CLEAR 06/22/2020 2224   LABSPEC 1.035 (H) 06/22/2020 2224   PHURINE 5.0 06/22/2020 2224   GLUCOSEU NEGATIVE 06/22/2020 2224   HGBUR NEGATIVE 06/22/2020 2224   BILIRUBINUR NEGATIVE 06/22/2020 2224   KETONESUR NEGATIVE 06/22/2020 2224   PROTEINUR NEGATIVE 06/22/2020 2224   NITRITE NEGATIVE 06/22/2020 2224   LEUKOCYTESUR TRACE (A) 06/22/2020 2224   Recent Results (from the past 240 hour(s))  Respiratory Panel by RT PCR (Flu A&B, Covid) - Nasopharyngeal Swab     Status: Abnormal   Collection Time: 07/06/20 11:32 AM   Specimen: Nasopharyngeal Swab  Result Value Ref Range Status   SARS Coronavirus 2 by RT PCR POSITIVE (A) NEGATIVE Final    Comment: RESULT CALLED TO, READ BACK BY AND VERIFIED WITH: GIRLEY,K. RN @1414  07/06/20 BILLINGSLEY,L (NOTE) SARS-CoV-2 target nucleic acids are DETECTED.  SARS-CoV-2 RNA is generally detectable in upper respiratory specimens  during the acute phase of infection. Positive results are indicative of the presence of the identified virus, but do not rule out bacterial infection or co-infection with other pathogens not detected by the test. Clinical correlation with patient history and other diagnostic information is necessary to determine patient infection status. The expected result is Negative.  Fact Sheet for Patients:  07/08/20  Fact  Sheet for Healthcare Providers: https://www.moore.com/  This test is not yet approved or cleared by the https://www.young.biz/ FDA and  has been authorized for detection and/or diagnosis of SARS-CoV-2 by FDA under an Emergency Use Authorization (EUA).  This EUA will remain in effect (meaning this test  can be used) for the duration of  the COVID-19 declaration under Section 564(b)(1) of the Act, 21 U.S.C. section 360bbb-3(b)(1), unless the authorization is terminated or revoked sooner.      Influenza A by PCR NEGATIVE NEGATIVE Final   Influenza B by PCR NEGATIVE NEGATIVE Final    Comment: (NOTE) The Xpert Xpress SARS-CoV-2/FLU/RSV assay is intended as an aid in  the diagnosis of influenza from Nasopharyngeal swab specimens and  should not be used as a sole basis for treatment. Nasal washings and  aspirates are unacceptable for Xpert Xpress SARS-CoV-2/FLU/RSV  testing.  Fact Sheet for Patients: [place]  Fact Sheet  for Healthcare Providers: https://www.young.biz/  This test is not yet approved or cleared by the [place] and  has been authorized for detection and/or diagnosis of SARS-CoV-2 by  FDA under an Emergency Use Authorization (EUA). This EUA will remain  in effect (meaning this test can be used) for the duration of the  Covid-19 declaration under Section 564(b)(1) of the Act, 21  U.S.C. section 360bbb-3(b)(1), unless the authorization is  terminated or revoked. Performed at [facility], [address], [place], [place] [address]   SARS Coronavirus 2 by RT PCR (hospital order, performed in [facility] hospital lab) Nasopharyngeal Nasopharyngeal Swab     Status: Abnormal   Collection Time: 07/06/20  3:20 PM   Specimen: Nasopharyngeal Swab  Result Value Ref Range Status   SARS Coronavirus 2 POSITIVE (A) NEGATIVE Final    Comment: CRITICAL RESULT CALLED TO, READ BACK BY AND VERIFIED  WITH: WILLIAMS,B. RN @1735  07/06/20 BILLINGSLEY,L (NOTE) SARS-CoV-2 target nucleic acids are DETECTED  SARS-CoV-2 RNA is generally detectable in upper respiratory specimens  during the acute phase of infection.  Positive results are indicative  of the presence of the identified virus, but do not rule out bacterial infection or co-infection with other pathogens not detected by the test.  Clinical correlation with patient history and  other diagnostic information is necessary to determine patient infection status.  The expected result is negative.  Fact Sheet for Patients:   07/08/20   Fact Sheet for Healthcare Providers:   BoilerBrush.com.cy    This test is not yet approved or cleared by the https://pope.com/ FDA and  has been authorized for detection and/or diagnosis of SARS-CoV-2 by FDA under an Emergency Use Authorization (EUA).  This EUA will remain in effec t (meaning this test can be used) for the duration of  the COVID-19 declaration under Section 564(b)(1) of the Act, 21 U.S.C. section 360-bbb-3(b)(1), unless the authorization is terminated or revoked sooner.  Performed at [facility], [address], [place], [place]       Radiology Studies: No results found.  Scheduled Meds: . vitamin C  500 mg Oral Daily  . aspirin  81 mg Oral Daily  . Chlorhexidine Gluconate Cloth  6 each Topical Daily  . cholecalciferol  1,000 Units Oral Daily  . feeding supplement  237 mL Oral BID BM  . feeding supplement (PRO-STAT SUGAR FREE 64)  30 mL Oral BID  . finasteride  5 mg Oral Daily  . heparin  5,000 Units Subcutaneous Q8H  . Ipratropium-Albuterol  1 puff Inhalation Q6H  . iron polysaccharides  150 mg Oral Daily  . levETIRAcetam  500 mg Oral BID  . loratadine  10 mg Oral Daily  . mouth rinse  15 mL Mouth Rinse BID  . metoprolol tartrate  12.5 mg Oral BID  . metroNIDAZOLE  500 mg Oral Q8H  .  nutrition supplement (JUVEN)  1 packet Oral BID BM  . pravastatin  40 mg Oral Daily  . saccharomyces boulardii  250 mg Oral BID  . selenium sulfide   Topical Daily  . sodium chloride flush  10-40 mL Intracatheter Q12H  . vitamin B-12  500 mcg Oral Daily  . zinc sulfate  220 mg Oral Daily   Continuous Infusions: .  ceFAZolin (ANCEF) IV 2 g (07/12/20 0600)     LOS: 20 days   Time spent: 25 minutes.  07/14/20, MD [facility] [email or web address] 07/12/2020, 2:46 PM

## 2020-07-13 ENCOUNTER — Ambulatory Visit: Payer: Medicare Other | Admitting: Registered"

## 2020-07-13 DIAGNOSIS — A419 Sepsis, unspecified organism: Secondary | ICD-10-CM | POA: Diagnosis not present

## 2020-07-13 NOTE — Progress Notes (Addendum)
PROGRESS NOTE  Doyl Bitting  BSJ:[id number] DOB: 06/16/1966 DOA: 06/22/2020 PCP: Eartha Inch, MD   Brief Narrative: Tracy Todd is a 54 y.o. 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 CT scan of the chest abdomen pelvis showed right lower lobe consolidation with mucoid material on the right lower lobe bronchus. Decubitus ulceration, erosive changes of the sacrum. Patient was treated as sepsis, likely multifactorial, and admitted to the hospital.  In the evening of 06/30/20 patient was transferred to stepdown unit due to severe hypotension, received IV fluid boluses.  Due to persistent hypotension he was started on midodrine 10 mg 3 times daily on 07/02/2020, held on 07/04/20 AM.  Seen by ID, Dr. Luciana Axe, who recommended continued ancef and po flagyl, plan for 6 weeks, end date 08/05/20. Hypotension had resolved and he was felt stable for discharge to SNF based on PT recommendations. Screening covid PCR in this setting was positive and confirmed (had been negative at admission). Since there were no respiratory symptoms or hypoxia, after discussing with his sister who is HCPOA, monoclonal antibody was administered 11/15. Fortunately the patient has remained asymptomatic. The patient is medically stable for discharge to SNF pending covid bed availability which is being sought.  Assessment & Plan: Active Problems:   Pressure injury of skin   Sepsis (HCC)  Covid-19 infection: Negative at admission, then positive screening test 11/15.  - Consistent with true positive testing as it was repeated and cycle time was 30. Pt was vaccinated w/Moderna 09/02/2019 and 09/30/2019.  - s/p monoclonal antibody 07/06/2020 for incidental covid.  - If patient remains  asymptomatic/mildly symptomatic, isolation period would be 10 days from positive test (final day of isolation would be 11/25).   Severe sepsis due to RLL aspiration pneumonia and chronic sacral and left trochanteric osteomyelitis: POA, resolved.  - Sepsis resolved, s/p IVF and midodrine, received cefepime and vancomycin for several days in addition to otherwise getting ancef, flagyl which will continue.  - Blood cultures 11/9 negative.   Acute hypoxic respiratory failure due to RLL pneumonia, atelectasis:  - Resolved.   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 - Continue prn dressing changes and offloading. Can trial toradol prn prior to dressing changes for pain control. Would avoid tramadol in pt w/hx seizures.  Dysphagia: Evaluated by SLP w/MBSS, moderate aspiration risk. - Continue dysphagia 1 diet, aspiration precautions, feeding assistance.   Loose stools/hypokalemia from GI losses: Benign abd exam.  - Continue probiotic  Subclinical hypothyroidism in the setting of acute illness: TSH 7.729, free T4 1.17. ?if multivitamin at home has biotin interfering with assay.  - Not currently symptomatically hypo or hyperthyroid. - Repeat TSH, T3, T4 in 6 weeks once complete IV antibiotics.   Hypocalcemia: Resolved with replacement when corrected for hypoalbuminemia.   Hypotension: Resolved  Severe protein calorie malnutrition:  - Supplement protein as able.   Hyperlipidemia:  - Continue statin. LFTs wnl.  Seizure disorder:  - Continue home keppra and seizure precautions.   Trisomy 21:  - Strongly encourage modification of typical visitation restrictions for this patient.  Macrocytic anemia: Folic acid and B12 wnl. No evidence of bleeding.  -  Monitor CBC in AM  Hypernatremia: Resolved with isotonic IVF.    - Encourage free water by mouth.  Hypokalemia: Improved.  - Continue to supplement as needed.  PVCs, NSVT: No sustained VT.  - Started low dose metoprolol (hypotension improved) and monitor closely  - Continue regular Mg and K supplementation.   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 3 x 3 cm opened wound with undermining at 3 cm  (Active)  06/23/20 0025  Location: Hip  Location Orientation: Left  Staging: Stage 4 - Full thickness tissue loss with exposed bone, tendon or muscle.  Wound Description (Comments): 3 x 3 cm opened wound with undermining at 3 cm   Present on Admission: Yes     Pressure Injury 06/23/20 Lower;Mid Stage 4 - Full thickness tissue loss with exposed bone, tendon or muscle. pressure ulcer with undermining (Active)  06/23/20 0025  Location:   Location Orientation: Lower;Mid  Staging: Stage 4 - Full thickness tissue loss with exposed bone, tendon or muscle.  Wound Description (Comments): pressure ulcer with undermining  Present on Admission: Yes  - Optimize wound healing by offloading, optimize nutritional status, treat infections.  DVT prophylaxis: Heparin 5k units q8h Code Status: DNR Family Communication: None at bedside, will call with any changes in status  Disposition Plan:  Status is: Inpatient  Remains inpatient appropriate because:Unsafe d/c plan (no SNF bed available)  Dispo:  Patient From: Home  Planned Disposition: Skilled Nursing Facility  Expected discharge date: 07/14/20  Medically stable for discharge: Yes  Consultants:   ID  IR  Procedures:   PICC line placement  Antimicrobials:  Vancomycin, cefepime  Ancef, flagyl ongoing   Subjective: No overnight events reported, VSS. Pt nonverbal, resting quietly.   Objective: Vitals:   07/12/20 1100 07/12/20 1328 07/12/20 2128 07/13/20 0406  BP: 104/82 111/68 100/63 (!) 93/51  Pulse: 78 77  96 97  Resp:   16 20  Temp:  (!) 97.3 F (36.3 C) 98.5 F (36.9 C) 98.4 F (36.9 C)  TempSrc:  Axillary Oral Oral  SpO2:  99%  (!) 88%  Weight:      Height:        Intake/Output Summary (Last 24 hours) at 07/13/2020 1106 Last data filed at 07/13/2020 0900 Gross per 24 hour  Intake --  Output 250 ml  Net -250 ml   Filed Weights   06/22/20 1713 07/01/20 0000  Weight: 40.8 kg (P) 45.4 kg   Gen: 54 y.o. male in no distress Pulm: Nonlabored breathing room air. Clear.  Data Reviewed: I have personally reviewed following labs and imaging studies  CBC: Recent Labs  Lab 07/07/20 0410 07/08/20 0500 07/09/20 0430 07/10/20 0230  WBC 3.4* 3.7* 7.1 3.3*  NEUTROABS 2.6 2.8 6.3 2.4  HGB 8.8* 9.5* 9.6* 9.2*  HCT 29.7* 31.4* 32.0* 30.9*  MCV 103.8* 104.3* 103.2* 104.0*  PLT 236 256 263 232   Basic Metabolic Panel: Recent Labs  Lab 07/07/20 0410 07/08/20 0500 07/09/20 0430 07/10/20 0230  NA 139 138 138 140  K 3.5 4.0 3.4* 3.1*  CL 104 105 101 104  CO2 28 29 27 28   GLUCOSE 95 95 101* 103*  BUN 16 18 8 10   CREATININE 0.37* 0.44* 0.42* 0.32*  CALCIUM 7.8* 7.7* 7.8* 7.8*   GFR: Estimated Creatinine Clearance: 60.2 mL/min (A) (by C-G formula based on SCr of 0.32 mg/dL (L)). Liver Function Tests: Recent Labs  Lab 07/07/20 0410 07/08/20 0500 07/09/20 0430 07/10/20 0230  AST 16 15 13* 13*  ALT 6 5 <5 5  ALKPHOS 45 46 45 47  BILITOT 0.3 0.3 0.3 0.6  PROT 5.5* 5.4* 5.5* 5.4*  ALBUMIN 2.1* 2.1* 2.2* 2.2*   No results for input(s): LIPASE, AMYLASE in the last 168 hours. No results for input(s): AMMONIA in the last 168 hours. Coagulation Profile: No results for input(s): INR, PROTIME in the last 168 hours. Cardiac Enzymes: No results for input(s): CKTOTAL, CKMB, CKMBINDEX, TROPONINI in the last 168 hours. BNP (last 3 results) No results for input(s): PROBNP in the last 8760 hours. HbA1C: No results for input(s): HGBA1C in the last 72 hours. CBG: No results for  input(s): GLUCAP in the last 168 hours. Lipid Profile: No results for input(s): CHOL, HDL, LDLCALC, TRIG, CHOLHDL, LDLDIRECT in the last 72 hours. Thyroid Function Tests: No results for input(s): TSH, T4TOTAL, FREET4, T3FREE, THYROIDAB in the last 72 hours. Anemia Panel: No results for input(s): VITAMINB12, FOLATE, FERRITIN, TIBC, IRON, RETICCTPCT in the last 72 hours. Urine analysis:    Component Value Date/Time   COLORURINE YELLOW 06/22/2020 2224   APPEARANCEUR CLEAR 06/22/2020 2224   LABSPEC 1.035 (H) 06/22/2020 2224   PHURINE 5.0 06/22/2020 2224   GLUCOSEU NEGATIVE 06/22/2020 2224   HGBUR NEGATIVE 06/22/2020 2224   BILIRUBINUR NEGATIVE 06/22/2020 2224   KETONESUR NEGATIVE 06/22/2020 2224   PROTEINUR NEGATIVE 06/22/2020 2224   NITRITE NEGATIVE 06/22/2020 2224   LEUKOCYTESUR TRACE (A) 06/22/2020 2224   Recent Results (from the past 240 hour(s))  Respiratory Panel by RT PCR (Flu A&B, Covid) - Nasopharyngeal Swab     Status: Abnormal   Collection Time: 07/06/20 11:32 AM   Specimen: Nasopharyngeal Swab  Result Value Ref Range Status   SARS Coronavirus 2 by RT PCR POSITIVE (A) NEGATIVE Final    Comment: RESULT CALLED TO, READ BACK BY AND VERIFIED WITH: GIRLEY,K. RN @1414  07/06/20 BILLINGSLEY,L (NOTE) SARS-CoV-2 target nucleic acids are DETECTED.  SARS-CoV-2 RNA is generally detectable in upper respiratory specimens  during the acute phase of infection. Positive results are indicative of the presence of the identified virus, but do not rule out bacterial infection or co-infection with other pathogens not detected by the test. Clinical correlation with patient history and other diagnostic information is necessary to determine patient infection status. The expected result is Negative.  Fact Sheet for Patients:  07/08/20  Fact Sheet for Healthcare Providers: https://www.moore.com/  This test is not yet approved or cleared by  the https://www.young.biz/ FDA and  has been authorized for detection and/or diagnosis of SARS-CoV-2 by FDA under an Emergency Use Authorization (EUA).  This EUA will remain in effect (meaning this test  can be used) for the duration of  the COVID-19 declaration under Section 564(b)(1) of the Act, 21 U.S.C. section 360bbb-3(b)(1), unless the authorization is terminated or revoked sooner.      Influenza A by PCR NEGATIVE NEGATIVE Final   Influenza B by PCR NEGATIVE NEGATIVE Final    Comment: (NOTE) The Xpert Xpress SARS-CoV-2/FLU/RSV assay is intended as an aid in  the diagnosis of influenza from Nasopharyngeal swab specimens and  should not be used as a sole basis for treatment. Nasal washings and  aspirates are unacceptable for Xpert Xpress SARS-CoV-2/FLU/RSV  testing.  Fact Sheet for Patients: [place]  Fact Sheet for Healthcare Providers: https://www.moore.com/  This test is not yet approved or cleared by the https://www.young.biz/ and  has been authorized for  detection and/or diagnosis of SARS-CoV-2 by  FDA under an Emergency Use Authorization (EUA). This EUA will remain  in effect (meaning this test can be used) for the duration of the  Covid-19 declaration under Section 564(b)(1) of the Act, 21  U.S.C. section 360bbb-3(b)(1), unless the authorization is  terminated or revoked. Performed at [facility], [address] [place], [place] [address]   SARS Coronavirus 2 by RT PCR (hospital order, performed in [facility] hospital lab) Nasopharyngeal Nasopharyngeal Swab     Status: Abnormal   Collection Time: 07/06/20  3:20 PM   Specimen: Nasopharyngeal Swab  Result Value Ref Range Status   SARS Coronavirus 2 POSITIVE (A) NEGATIVE Final    Comment: CRITICAL RESULT CALLED TO, READ BACK BY AND VERIFIED WITH: WILLIAMS,B. RN @1735  07/06/20 BILLINGSLEY,L (NOTE) SARS-CoV-2 target nucleic acids are DETECTED  SARS-CoV-2  RNA is generally detectable in upper respiratory specimens  during the acute phase of infection.  Positive results are indicative  of the presence of the identified virus, but do not rule out bacterial infection or co-infection with other pathogens not detected by the test.  Clinical correlation with patient history and  other diagnostic information is necessary to determine patient infection status.  The expected result is negative.  Fact Sheet for Patients:   07/08/20   Fact Sheet for Healthcare Providers:   BoilerBrush.com.cy    This test is not yet approved or cleared by the https://pope.com/ FDA and  has been authorized for detection and/or diagnosis of SARS-CoV-2 by FDA under an Emergency Use Authorization (EUA).  This EUA will remain in effec t (meaning this test can be used) for the duration of  the COVID-19 declaration under Section 564(b)(1) of the Act, 21 U.S.C. section 360-bbb-3(b)(1), unless the authorization is terminated or revoked sooner.  Performed at [facility], [address] [place], [place]       Radiology Studies: No results found.  Scheduled Meds: . vitamin C  500 mg Oral Daily  . aspirin  81 mg Oral Daily  . Chlorhexidine Gluconate Cloth  6 each Topical Daily  . cholecalciferol  1,000 Units Oral Daily  . feeding supplement  237 mL Oral BID BM  . feeding supplement (PRO-STAT SUGAR FREE 64)  30 mL Oral BID  . finasteride  5 mg Oral Daily  . heparin  5,000 Units Subcutaneous Q8H  . Ipratropium-Albuterol  1 puff Inhalation Q6H  . iron polysaccharides  150 mg Oral Daily  . levETIRAcetam  500 mg Oral BID  . loratadine  10 mg Oral Daily  . mouth rinse  15 mL Mouth Rinse BID  . metoprolol tartrate  12.5 mg Oral BID  . metroNIDAZOLE  500 mg Oral Q8H  . nutrition supplement (JUVEN)  1 packet Oral BID BM  . pravastatin  40 mg Oral Daily  . saccharomyces boulardii  250 mg  Oral BID  . selenium sulfide   Topical Daily  . sodium chloride flush  10-40 mL Intracatheter Q12H  . vitamin B-12  500 mcg Oral Daily  . zinc sulfate  220 mg Oral Daily   Continuous Infusions: .  ceFAZolin (ANCEF) IV 2 g (07/13/20 0646)     LOS: 21 days   Time spent: 15 minutes.  07/15/20, MD [facility] [email or web address] 07/13/2020, 11:06 AM

## 2020-07-14 DIAGNOSIS — A419 Sepsis, unspecified organism: Secondary | ICD-10-CM | POA: Diagnosis not present

## 2020-07-14 NOTE — TOC Progression Note (Addendum)
Transition of Care [facility]) - Progression Note    Patient Details  Name: Tracy Todd MRN: [id number] Date of Birth: 09-09-1965  Transition of Care [facility]) CM/SW Contact  Tracy Todd, [place] Phone Number: 07/14/2020, 3:48 PM  Clinical Narrative:  Updated Tracy Todd with information about being off isolation restrictions as of Friday, and ability to move from IV abx to oral.  While happy to hear the news, she also informed me that Tracy Todd had been staying with a couple in which the husband just died this week from COVID.  So now plans are up in the air.  I called Care Manager Tracy Todd, [phone] to let her know what is going on and request help with getting him out of the hospital.  She will investigate and we will talk tomorrow. She is working on Friday.  She gave me a number for Tracy Todd who is the agency [facility]. [phone]. TOC will continue to follow during the course of hospitalization.  Addendum 11/24 1:30 PM Spoke with Tracy Todd who confirms ability to move patient on Friday.  However, she defers specifics to Tracy Todd, stating that Tracy Todd must be consulted for approval. I left message for Tracy Todd to call me back to confirm address and approval by Tracy Todd.        Expected Discharge Plan: Skilled Nursing Facility Barriers to Discharge: SNF Pending bed offer  Expected Discharge Plan and Services Expected Discharge Plan: Skilled Nursing Facility       Living arrangements for the past 2 months: Single Family Home Expected Discharge Date: 07/13/20                                     Social Determinants of Health (SDOH) Interventions    Readmission Risk Interventions No flowsheet data found.

## 2020-07-14 NOTE — Progress Notes (Signed)
Patient may have a support person/visitor  "Visitor Guideline, Cognitivie Needs, In-patient".

## 2020-07-14 NOTE — Progress Notes (Addendum)
Speech Language Pathology Discharge Patient Details Name: Tracy Todd MRN: [id number] DOB: [birth year] Today's Date: 07/14/2020 Time:  -     Patient discharged from SLP services secondary to goals met and no further SLP needs identified. Recommend brief SLP follow up at next venue of care for dysphagia management and continue puree/thin diet.   Please see latest therapy progress note for current level of functioning and progress toward goals.    Progress and discharge plan discussed with patient and/or caregiver: Patient unable to participate in discharge planning and no caregivers available  GO    Kathleen Lime, MS [place] Office [phone] Pager [phone]   [name] 07/14/2020, 7:05 PM

## 2020-07-14 NOTE — Progress Notes (Signed)
PROGRESS NOTE  Tracy Todd  OFB:[id number] DOB: November 18, 1965 DOA: 06/22/2020 PCP: Eartha Inch, MD   Brief Narrative: Tracy Todd is a 54 y.o. 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 CT scan of the chest abdomen pelvis showed right lower lobe consolidation with mucoid material on the right lower lobe bronchus. Decubitus ulceration, erosive changes of the sacrum. Patient was treated as sepsis, likely multifactorial, and admitted to the hospital.  In the evening of 06/30/20 patient was transferred to stepdown unit due to severe hypotension, received IV fluid boluses.  Due to persistent hypotension he was started on midodrine 10 mg 3 times daily on 07/02/2020, held on 07/04/20 AM.  Seen by ID, Dr. Luciana Axe, who recommended continued ancef and po flagyl, plan for 6 weeks, end date 08/05/20. Hypotension had resolved and he was felt stable for discharge to SNF based on PT recommendations. Screening covid PCR in this setting was positive and confirmed (had been negative at admission). Since there were no respiratory symptoms or hypoxia, after discussing with his sister who is HCPOA, monoclonal antibody was administered 11/15. Fortunately the patient has remained asymptomatic. The patient is medically stable for discharge to SNF pending covid bed availability which is being sought.  Assessment & Plan: Active Problems:   Pressure injury of skin   Sepsis (HCC)  Covid-19 infection: Negative at admission, then positive screening test 11/15.  - Consistent with true positive testing as it was repeated and cycle time was 30. Pt was vaccinated w/Moderna 09/02/2019 and 09/30/2019.  - s/p monoclonal antibody 07/06/2020 for incidental covid.  - If patient remains  asymptomatic/mildly symptomatic, isolation period would be 10 days from positive test (final day of isolation would be 11/25).  - Discussed this case with IP this morning who confirms this patient's circumstances warrant amendment to visitation restrictions. She will put a note in the chart to the effect that the patient may have a visitor who will require PPE. I discussed with charge RN who will contact family.  Severe sepsis due to RLL aspiration pneumonia and chronic sacral and left trochanteric osteomyelitis: POA, resolved.  - Sepsis resolved, s/p IVF and midodrine, received cefepime and vancomycin for several days in addition to otherwise getting ancef, flagyl which will continue.  - Blood cultures 11/9 negative.   Acute hypoxic respiratory failure due to RLL pneumonia, atelectasis:  - Resolved.   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 - Continue prn dressing changes and offloading. Can trial toradol prn prior to dressing changes for pain control. Would avoid tramadol in pt w/hx seizures.  Dysphagia: Evaluated by SLP w/MBSS, moderate aspiration risk. - Continue dysphagia 1 diet, aspiration precautions, feeding assistance.   Loose stools/hypokalemia from GI losses: Benign abd exam.  - Continue probiotic  Subclinical hypothyroidism in the setting of acute illness: TSH 7.729, free T4 1.17. ?if multivitamin at home has biotin interfering with assay.  - Not currently symptomatically hypo or hyperthyroid. - Repeat TSH, T3, T4 in 6 weeks once complete IV antibiotics.   Hypocalcemia: Resolved with replacement when corrected for hypoalbuminemia.   Hypotension: Resolved  Severe protein calorie malnutrition:  - Supplement protein as able.  Hyperlipidemia:  - Continue statin. LFTs wnl.  Seizure disorder:  -  Continue home keppra and seizure precautions.   Trisomy 21:  - Strongly encourage modification of typical visitation restrictions for this patient.  Macrocytic anemia: Folic acid and B12 wnl. No evidence of bleeding.  - Monitor CBC in AM  Hypernatremia: Resolved with isotonic IVF.  - Encourage free water by mouth.  Hypokalemia: Improved.  - Continue to supplement as needed. - Will recheck BMP in AM.  PVCs, NSVT: No sustained VT.  - Started low dose metoprolol (hypotension improved) and monitor closely  - Continue regular Mg and K supplementation.   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 3 x 3 cm opened wound with undermining at 3 cm  (Active)  06/23/20 0025  Location: Hip  Location Orientation: Left  Staging: Stage 4 - Full thickness tissue loss with exposed bone, tendon or muscle.  Wound Description (Comments): 3 x 3 cm opened wound with undermining at 3 cm   Present on Admission: Yes     Pressure Injury 06/23/20 Lower;Mid Stage 4 - Full thickness tissue loss with exposed bone, tendon or muscle. pressure ulcer with undermining (Active)  06/23/20 0025  Location:   Location Orientation: Lower;Mid  Staging: Stage 4 - Full thickness tissue loss with exposed bone, tendon or muscle.  Wound Description (Comments): pressure ulcer with undermining  Present on Admission: Yes  - Optimize wound healing by offloading, optimize nutritional status, treat infections.  DVT prophylaxis: Heparin 5k units q8h Code Status: DNR Family Communication: None at bedside, spoke with sister by phone 11/22. Disposition Plan:  Status is: Inpatient  Remains inpatient appropriate because:Unsafe d/c plan (no SNF bed available)  Dispo:  Patient From: Home  Planned Disposition: Skilled Nursing Facility  Expected discharge date: 07/14/20  Medically stable for discharge: Yes  Consultants:    ID  IR  Procedures:   PICC line placement  Antimicrobials:  Vancomycin, cefepime  Ancef, flagyl ongoing   Subjective: No new events overnight. No bleeding reported, no fevers.  Objective: Vitals:   07/13/20 1402 07/13/20 1900 07/14/20 0022 07/14/20 0453  BP:  (!) 86/58 94/74 101/68  Pulse: 82 97 97 79  Resp:  15 15 16   Temp:  97.8 F (36.6 C) 98.1 F (36.7 C) 98.9 F (37.2 C)  TempSrc:  Axillary Axillary Axillary  SpO2: (!) 88% 90% (!) 89% 93%  Weight:      Height:        Intake/Output Summary (Last 24 hours) at 07/14/2020 0836 Last data filed at 07/14/2020 0602 Gross per 24 hour  Intake 445 ml  Output 750 ml  Net -305 ml   Filed Weights   06/22/20 1713 07/01/20 0000  Weight: 40.8 kg (P) 45.4 kg   Gen: 54 y.o. male in no distress Pulm: Nonlabored breathing room air. Clear. CV: Regular rate and rhythm. No murmur, rub, or gallop. No JVD, no dependent edema. GI: Abdomen soft, non-tender, non-distended, with normoactive bowel sounds.  Ext: Warm, spastic contractures stable, decreased muscle bulk Skin: No new lesions on visualized skin. Due to patient discomfort, did not roll to examine wounds today. Neuro: Alert and interactive though nonverbal. Psych: Calm  CBC: Recent Labs  Lab 07/08/20 0500 07/09/20 0430 07/10/20 0230  WBC 3.7* 7.1 3.3*  NEUTROABS 2.8 6.3 2.4  HGB 9.5* 9.6* 9.2*  HCT 31.4* 32.0* 30.9*  MCV 104.3* 103.2* 104.0*  PLT 256 263 232  Basic Metabolic Panel: Recent Labs  Lab 07/08/20 0500 07/09/20 0430 07/10/20 0230  NA 138 138 140  K 4.0 3.4* 3.1*  CL 105 101 104  CO2 29 27 28   GLUCOSE 95 101* 103*  BUN 18 8 10   CREATININE 0.44* 0.42* 0.32*  CALCIUM 7.7* 7.8* 7.8*    LOS: 22 days   Time spent: 15 minutes.  , MD [facility] [email or web address] 07/14/2020, 8:36 AM

## 2020-07-15 ENCOUNTER — Ambulatory Visit: Payer: Medicare Other | Admitting: Infectious Diseases

## 2020-07-15 DIAGNOSIS — A419 Sepsis, unspecified organism: Secondary | ICD-10-CM | POA: Diagnosis not present

## 2020-07-15 DIAGNOSIS — M869 Osteomyelitis, unspecified: Secondary | ICD-10-CM | POA: Diagnosis not present

## 2020-07-15 DIAGNOSIS — J189 Pneumonia, unspecified organism: Secondary | ICD-10-CM | POA: Diagnosis not present

## 2020-07-15 LAB — BASIC METABOLIC PANEL
Anion gap: 8 (ref 5–15)
BUN: 16 mg/dL (ref 6–20)
CO2: 28 mmol/L (ref 22–32)
Calcium: 8 mg/dL — ABNORMAL LOW (ref 8.9–10.3)
Chloride: 104 mmol/L (ref 98–111)
Creatinine, Ser: 0.46 mg/dL — ABNORMAL LOW (ref 0.61–1.24)
GFR, Estimated: >60 mL/min (ref >60)
Glucose, Bld: 101 mg/dL — ABNORMAL HIGH (ref 70–99)
Potassium: 3.3 mmol/L — ABNORMAL LOW (ref 3.5–5.1)
Sodium: 140 mmol/L (ref 135–145)

## 2020-07-15 LAB — CBC
HCT: 31.6 % — ABNORMAL LOW (ref 39.0–52.0)
Hemoglobin: 9.4 g/dL — ABNORMAL LOW (ref 13.0–17.0)
MCH: 31.2 pg (ref 26.0–34.0)
MCHC: 29.7 g/dL — ABNORMAL LOW (ref 30.0–36.0)
MCV: 105 fL — ABNORMAL HIGH (ref 80.0–100.0)
Platelets: 230 10*3/uL (ref 150–400)
RBC: 3.01 MIL/uL — ABNORMAL LOW (ref 4.22–5.81)
RDW: 18.3 % — ABNORMAL HIGH (ref 11.5–15.5)
WBC: 3.5 10*3/uL — ABNORMAL LOW (ref 4.0–10.5)
nRBC: 0 % (ref 0.0–0.2)

## 2020-07-15 LAB — GLUCOSE, CAPILLARY: Glucose-Capillary: 82 mg/dL (ref 70–99)

## 2020-07-15 MED ORDER — POTASSIUM CHLORIDE CRYS ER 20 MEQ PO TBCR
40.0000 meq | EXTENDED_RELEASE_TABLET | Freq: Once | ORAL | Status: DC
  Filled 2020-07-15: qty 2

## 2020-07-15 NOTE — Progress Notes (Signed)
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  CT scan of the chest, abdomen and pelvis showed right lower lobe consolidation with mucoid material on the right lower lobe bronchus.  Imaging also showed decubitus ulceration and erosive changes to the sacrum.  Patient had an event on 11/now with hypotension requiring transfer to stepdown.  He was seen by ID Dr. Luciana Axe, who recommended intravenous antibiotics for osteomyelitis.  Subjective / 24h Interval events: Nonverbal, no apparent distress  Assessment & Plan:  Principal Problem Covid-19 Viral Illness -He was negative on admission and then positive on screening on 11/15.  Consistent with true positive test test was repeated and cycle time was 30.  Patient is status post more than the vaccination February 2021.  Given incidental COVID-19 finding, he received monoclonal antibodies on 11/15. -He has remained asymptomatic, afebrile, will discontinue isolation from 10 days from positive test, last day to keep on contact is 11/25, will take of precautions on Friday 11/26  COVID-19 Labs  No results for input(s): DDIMER, FERRITIN, LDH, CRP in the last 72 hours.  Lab Results  Component Value Date   SARSCOV2NAA POSITIVE (A) 07/06/2020   SARSCOV2NAA POSITIVE (A) 07/06/2020   SARSCOV2NAA NEGATIVE 06/22/2020   SARSCOV2NAA NEGATIVE 02/12/2020   Active Problems Severe sepsis due to right lower lobe aspiration pneumonia and chronic sacral and left trochanteric osteomyelitis, POA -Sepsis physiology resolved, status post IV fluids and midodrine, currently on antibiotics with  metronidazole as well as Ancef for 6 weeks with end date 08/05/2020.  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   Hyperlipidemia -Continue statin. LFTs wnl.  Seizure disorder -Continue home keppra and seizure precautions.   Trisomy 21  -noted  Macrocytic anemia: Folic acid and B12 wnl. No evidence of bleeding.  - Monitor CBC in AM  Hypernatremia  -Resolved with isotonic IVF.  -Encourage free water by mouth.  Hypokalemia: Improved.  -Continue to supplement as needed. -Will recheck BMP in AM.  PVCs, NSVT: No sustained VT.  -Started low dose metoprolol (hypotension improved) and monitor closely  -Continue regular Mg and K supplementation.    Scheduled Meds: . vitamin C  500 mg Oral Daily  . aspirin  81 mg Oral Daily  . Chlorhexidine Gluconate Cloth  6 each Topical Daily  . cholecalciferol  1,000 Units Oral Daily  . feeding supplement  237 mL Oral BID BM  . feeding supplement (PRO-STAT SUGAR FREE 64)  30 mL Oral BID  . finasteride  5 mg Oral Daily  . heparin  5,000 Units Subcutaneous Q8H  . Ipratropium-Albuterol  1 puff Inhalation Q6H  . iron polysaccharides  150 mg Oral Daily  . levETIRAcetam  500 mg Oral BID  . loratadine  10 mg Oral Daily  . mouth rinse  15 mL Mouth Rinse BID  . metoprolol tartrate  12.5 mg Oral BID  . metroNIDAZOLE  500 mg  Oral Q8H  . nutrition supplement (JUVEN)  1 packet Oral BID BM  . potassium chloride  40 mEq Oral Once  . pravastatin  40 mg Oral Daily  . saccharomyces boulardii  250 mg Oral BID  . selenium sulfide   Topical Daily  . sodium chloride flush  10-40 mL Intracatheter Q12H    . vitamin B-12  500 mcg Oral Daily  . zinc sulfate  220 mg Oral Daily   Continuous Infusions: .  ceFAZolin (ANCEF) IV 2 g (07/15/20 0520)   PRN Meds:.acetaminophen **OR** acetaminophen, bisacodyl, heparin lock flush, ketorolac, midodrine, ondansetron **OR** ondansetron (ZOFRAN) IV, sodium chloride flush, sodium phosphate  DVT prophylaxis: heparin Code Status: DNR Family Communication: no family at bedside   Diet Orders (From admission, onward)    Start     Ordered   07/09/20 0000  Diet - low sodium heart healthy        07/09/20 0912   07/01/20 1348  DIET - DYS 1 Room service appropriate? No; Fluid consistency: Thin  Diet effective now       Comments: Extra gravy/sauces  Question Answer Comment  Room service appropriate? No   Fluid consistency: Thin      07/01/20 1348          Status is: Inpatient  Remains inpatient appropriate because:Unsafe d/c plan   Dispo:  Patient From: Home  Planned Disposition: Skilled Nursing Facility  Expected discharge date: 07/17/20  Medically stable for discharge: YES  Consultants:  ID  Procedures:  None   Microbiology: None   Antibacterials: Cefazolin / Metronidazole    Objective: Vitals:   07/14/20 0453 07/14/20 1308 07/14/20 2029 07/15/20 0641  BP: 101/68 (!) 149/114 (!) 87/70 102/82  Pulse: 79 97 90 72  Resp: 16 16 16 16   Temp: 98.9 F (37.2 C) 97.6 F (36.4 C) 98.9 F (37.2 C) 97.9 F (36.6 C)  TempSrc: Axillary Oral  Oral  SpO2: 93% 96% 96% 99%  Weight:      Height:        Intake/Output Summary (Last 24 hours) at 07/15/2020 1404 Last data filed at 07/15/2020 0859 Gross per 24 hour  Intake 421 ml  Output 850 ml  Net -429 ml   Filed Weights   06/22/20 1713 07/01/20 0000  Weight: 40.8 kg (P) 45.4 kg    Examination:  Constitutional: NAD Eyes: no scleral icterus ENMT: Mucous membranes are moist.  Neck: normal, supple Respiratory: clear to auscultation bilaterally, no wheezing, no crackles.   Cardiovascular: Regular rate and rhythm, no murmurs / rubs / gallops. No LE edema.  Abdomen: non distended, no tenderness. Bowel sounds positive.  Musculoskeletal: no clubbing / cyanosis.  Skin: no rashes Neurologic: Does not follow commands   Data Reviewed: I have independently reviewed following labs and imaging studies   CBC: Recent Labs  Lab 07/09/20 0430 07/10/20 0230 07/15/20 0427  WBC 7.1 3.3* 3.5*  NEUTROABS 6.3 2.4  --   HGB 9.6* 9.2* 9.4*  HCT 32.0* 30.9* 31.6*  MCV 103.2* 104.0* 105.0*  PLT 263 232 230   Basic Metabolic Panel: Recent Labs  Lab 07/09/20 0430 07/10/20 0230 07/15/20 0427  NA 138 140 140  K 3.4* 3.1* 3.3*  CL 101 104 104  CO2 27 28 28   GLUCOSE 101* 103* 101*  BUN 8 10 16   CREATININE 0.42* 0.32* 0.46*  CALCIUM 7.8* 7.8* 8.0*   GFR: Estimated Creatinine Clearance: 60.2 mL/min (A) (by C-G formula based on SCr of 0.46 mg/dL (L)).  Liver Function Tests: Recent Labs  Lab 07/09/20 0430 07/10/20 0230  AST 13* 13*  ALT <5 5  ALKPHOS 45 47  BILITOT 0.3 0.6  PROT 5.5* 5.4*  ALBUMIN 2.2* 2.2*   No results for input(s): LIPASE, AMYLASE in the last 168 hours. No results for input(s): AMMONIA in the last 168 hours. Coagulation Profile: No results for input(s): INR, PROTIME in the last 168 hours. Cardiac Enzymes: No results for input(s): CKTOTAL, CKMB, CKMBINDEX, TROPONINI in the last 168 hours. BNP (last 3 results) No results for input(s): PROBNP in the last 8760 hours. HbA1C: No results for input(s): HGBA1C in the last 72 hours. CBG: No results for input(s): GLUCAP in the last 168 hours. Lipid Profile: No results for input(s): CHOL, HDL, LDLCALC, TRIG, CHOLHDL, LDLDIRECT in the last 72 hours. Thyroid Function Tests: No results for input(s): TSH, T4TOTAL, FREET4, T3FREE, THYROIDAB in the last 72 hours. Anemia Panel: No results for input(s): VITAMINB12, FOLATE, FERRITIN, TIBC, IRON, RETICCTPCT in the last 72 hours. Urine analysis:     Component Value Date/Time   COLORURINE YELLOW 06/22/2020 2224   APPEARANCEUR CLEAR 06/22/2020 2224   LABSPEC 1.035 (H) 06/22/2020 2224   PHURINE 5.0 06/22/2020 2224   GLUCOSEU NEGATIVE 06/22/2020 2224   HGBUR NEGATIVE 06/22/2020 2224   BILIRUBINUR NEGATIVE 06/22/2020 2224   KETONESUR NEGATIVE 06/22/2020 2224   PROTEINUR NEGATIVE 06/22/2020 2224   NITRITE NEGATIVE 06/22/2020 2224   LEUKOCYTESUR TRACE (A) 06/22/2020 2224   Sepsis Labs: Invalid input(s): PROCALCITONIN, LACTICIDVEN  Recent Results (from the past 240 hour(s))  Respiratory Panel by RT PCR (Flu A&B, Covid) - Nasopharyngeal Swab     Status: Abnormal   Collection Time: 07/06/20 11:32 AM   Specimen: Nasopharyngeal Swab  Result Value Ref Range Status   SARS Coronavirus 2 by RT PCR POSITIVE (A) NEGATIVE Final    Comment: RESULT CALLED TO, READ BACK BY AND VERIFIED WITH: GIRLEY,K. RN @1414  07/06/20 BILLINGSLEY,L (NOTE) SARS-CoV-2 target nucleic acids are DETECTED.  SARS-CoV-2 RNA is generally detectable in upper respiratory specimens  during the acute phase of infection. Positive results are indicative of the presence of the identified virus, but do not rule out bacterial infection or co-infection with other pathogens not detected by the test. Clinical correlation with patient history and other diagnostic information is necessary to determine patient infection status. The expected result is Negative.  Fact Sheet for Patients:  07/08/20  Fact Sheet for Healthcare Providers: https://www.moore.com/  This test is not yet approved or cleared by the https://www.young.biz/ FDA and  has been authorized for detection and/or diagnosis of SARS-CoV-2 by FDA under an Emergency Use Authorization (EUA).  This EUA will remain in effect (meaning this test  can be used) for the duration of  the COVID-19 declaration under Section 564(b)(1) of the Act, 21 U.S.C. section 360bbb-3(b)(1), unless  the authorization is terminated or revoked sooner.      Influenza A by PCR NEGATIVE NEGATIVE Final   Influenza B by PCR NEGATIVE NEGATIVE Final    Comment: (NOTE) The Xpert Xpress SARS-CoV-2/FLU/RSV assay is intended as an aid in  the diagnosis of influenza from Nasopharyngeal swab specimens and  should not be used as a sole basis for treatment. Nasal washings and  aspirates are unacceptable for Xpert Xpress SARS-CoV-2/FLU/RSV  testing.  Fact Sheet for Patients: [place]  Fact Sheet for Healthcare Providers: https://www.moore.com/  This test is not yet approved or cleared by the https://www.young.biz/ FDA and  has been authorized for detection and/or diagnosis  of SARS-CoV-2 by  FDA under an Emergency Use Authorization (EUA). This EUA will remain  in effect (meaning this test can be used) for the duration of the  Covid-19 declaration under Section 564(b)(1) of the Act, 21  U.S.C. section 360bbb-3(b)(1), unless the authorization is  terminated or revoked. Performed at [facility], [address] [place], [place] [address]   SARS Coronavirus 2 by RT PCR (hospital order, performed in [facility] hospital lab) Nasopharyngeal Nasopharyngeal Swab     Status: Abnormal   Collection Time: 07/06/20  3:20 PM   Specimen: Nasopharyngeal Swab  Result Value Ref Range Status   SARS Coronavirus 2 POSITIVE (A) NEGATIVE Final    Comment: CRITICAL RESULT CALLED TO, READ BACK BY AND VERIFIED WITH: WILLIAMS,B. RN @1735  07/06/20 BILLINGSLEY,L (NOTE) SARS-CoV-2 target nucleic acids are DETECTED  SARS-CoV-2 RNA is generally detectable in upper respiratory specimens  during the acute phase of infection.  Positive results are indicative  of the presence of the identified virus, but do not rule out bacterial infection or co-infection with other pathogens not detected by the test.  Clinical correlation with patient history and  other  diagnostic information is necessary to determine patient infection status.  The expected result is negative.  Fact Sheet for Patients:   BoilerBrush.com.cyhttps://www.fda.gov/media/136312/download   Fact Sheet for Healthcare Providers:   https://pope.com/https://www.fda.gov/media/136313/download    This test is not yet approved or cleared by the [place] FDA and  has been authorized for detection and/or diagnosis of SARS-CoV-2 by FDA under an Emergency Use Authorization (EUA).  This EUA will remain in effec t (meaning this test can be used) for the duration of  the COVID-19 declaration under Section 564(b)(1) of the Act, 21 U.S.C. section 360-bbb-3(b)(1), unless the authorization is terminated or revoked sooner.  Performed at [facility], [address] [place], [place] [address]       Radiology Studies: No results found.   Pamella Pertostin Rejina Odle, MD, PhD [facility]  Between 7 am - 7 pm I am available, please contact me via Amion or Securechat  Between 7 pm - 7 am I am not available, please contact night coverage MD/APP via Amion

## 2020-07-15 NOTE — Progress Notes (Signed)
Nutrition Follow-up  RD working remotely.  DOCUMENTATION CODES:   Underweight  INTERVENTION:   - Please obtain updated weight  - Ensure Enlive po BID, each supplement provides 350 kcal and 20 grams of protein  - Pro-Stat po BID ordered. Can substitute with ProSource Plus po BID, each provides 100 kcals and 15 grams protein  - Juven Fruit PunchBID, each serving provides 95 kcal and 2.5 grams of protein (also provides amino acids glutamine and arginine to aid in wound healing)  NUTRITION DIAGNOSIS:   Increased nutrient needs related to wound healing as evidenced by estimated needs.  Ongoing  GOAL:   Patient will meet greater than or equal to 90% of their needs  Progressing  MONITOR:   PO intake, Supplement acceptance, Labs, Weight trends, I & O's, Skin  REASON FOR ASSESSMENT:   Malnutrition Screening Tool (wounds)    ASSESSMENT:   54 y.o. male,  history of seizures, recurrent syncope, hyperlipidemia, iron deficiency anemia, Down syndrome, and more who presents to the ER with chief complaint of fever and cough.  Patient is nonverbal and was not able to provide any history whatsoever, but chart review reveals the patient was experiencing cough and shortness of breath.  Patient with down syndrome, nonverbal per notes. RD did not attempt to contact pt via phone call to room.  Patient no COVID-19 positive as of 11/15. Per MD note, pt will be taken off precautions on 11/26. Pt is stable for d/c to SNF.  Pt accepting >90% of Ensure Enlive, Pro-stat, and Juven supplements per [facility] documentation. Will continue with current regimen.  No weights available since 07/01/20.  Meal Completion: 10-35%  Medications reviewed and include: vitamin C, cholecalciferol, Ensure Enlive BID, Pro-Stat BID, niferex, Juven BID, klor-con 40 mEq once, florastor, vitamin B-12, zinc sulfate, IV abx  Labs reviewed: potassium 3.3, hemoglobin 9.4  Diet Order:   Diet Order            Diet - low  sodium heart healthy           DIET - DYS 1 Room service appropriate? No; Fluid consistency: Thin  Diet effective now                 EDUCATION NEEDS:   No education needs have been identified at this time  Skin:  Skin Assessment: Skin Integrity Issues: Stage III: left hip, sacrum -chronic nonhealing per WOC note  Last BM:  07/15/20  Height:   Ht Readings from Last 1 Encounters:  07/01/20 (P) 5' (1.524 m)    Weight:   Wt Readings from Last 1 Encounters:  07/01/20 (P) 45.4 kg    BMI:  Body mass index is 19.53 kg/m (pended).  Estimated Nutritional Needs:   Kcal:  1650-1850  Protein:  75-90g  Fluid:  1.8L/day    Mertie Clause, MS, RD, LDN Inpatient Clinical Dietitian Please see AMiON for contact information.

## 2020-07-15 NOTE — TOC Progression Note (Addendum)
Transition of Care [facility]) - Progression Note    Patient Details  Name: Tracy Todd MRN: [id number] Date of Birth: 1966-03-26  [facility] CM/SW Contact  Tracy Todd, [place] Phone Number: 07/15/2020, 2:48 PM  Clinical Narrative:   Spoke with Tracy Todd who is the agency QP. [phone]. She confirms that they will have a bed on Sunday for patient. Address for [facility] is [address], [place] [id number].  Ms Tracy Todd is the [facility] home who will be caring for Mr Leinberger.  Her number is [phone].  Call her to let her know when [facility] is on the way. TOC will continue to follow during the course of hospitalization.  Addendum:  Received call from [place] at [facility].  They will continue to follow patient at new address for [facility] RN, PT.  MD messaged for orders.     Expected Discharge Plan: Group Home Barriers to Discharge: Barriers Resolved  Expected Discharge Plan and Services Expected Discharge Plan: Group Home       Living arrangements for the past 2 months: Single Family Home Expected Discharge Date: 07/13/20                                     Social Determinants of Health (SDOH) Interventions    Readmission Risk Interventions No flowsheet data found.

## 2020-07-15 NOTE — Progress Notes (Deleted)
Patient has been discharge instructions. Patient has verbalized understanding.   Telemetry has been discontinued.  DME at bedside and will be taken home with the the patient when significant other picks him up in private vehicle.  Will continue to monitor.

## 2020-07-16 DIAGNOSIS — M8600 Acute hematogenous osteomyelitis, unspecified site: Secondary | ICD-10-CM | POA: Diagnosis not present

## 2020-07-16 LAB — BASIC METABOLIC PANEL
Anion gap: 8 (ref 5–15)
BUN: 16 mg/dL (ref 6–20)
CO2: 27 mmol/L (ref 22–32)
Calcium: 8 mg/dL — ABNORMAL LOW (ref 8.9–10.3)
Chloride: 106 mmol/L (ref 98–111)
Creatinine, Ser: 0.42 mg/dL — ABNORMAL LOW (ref 0.61–1.24)
GFR, Estimated: >60 mL/min (ref >60)
Glucose, Bld: 99 mg/dL (ref 70–99)
Potassium: 3.2 mmol/L — ABNORMAL LOW (ref 3.5–5.1)
Sodium: 141 mmol/L (ref 135–145)

## 2020-07-16 LAB — GLUCOSE, CAPILLARY
Glucose-Capillary: 125 mg/dL — ABNORMAL HIGH (ref 70–99)
Glucose-Capillary: 92 mg/dL (ref 70–99)

## 2020-07-16 LAB — MAGNESIUM: Magnesium: 1.9 mg/dL (ref 1.7–2.4)

## 2020-07-16 MED ORDER — POTASSIUM CHLORIDE 20 MEQ/15ML (10%) PO SOLN
40.0000 meq | Freq: Once | ORAL | Status: AC
  Administered 2020-07-16: 40 meq via ORAL
  Filled 2020-07-16: qty 30

## 2020-07-16 MED ORDER — IPRATROPIUM-ALBUTEROL 20-100 MCG/ACT IN AERS
1.0000 | INHALATION_SPRAY | Freq: Two times a day (BID) | RESPIRATORY_TRACT | Status: DC
  Administered 2020-07-17 – 2020-07-20 (×6): 1 via RESPIRATORY_TRACT

## 2020-07-16 NOTE — Progress Notes (Signed)
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  CT scan of the chest, abdomen and pelvis showed right lower lobe consolidation with mucoid material on the right lower lobe bronchus.  Imaging also showed decubitus ulceration and erosive changes to the sacrum.  Patient had an event on 11/now with hypotension requiring transfer to stepdown.  He was seen by ID Dr. Luciana Axe, who recommended intravenous antibiotics for osteomyelitis.  Subjective / 24h Interval events: Nonverbal, no apparent distress  Assessment & Plan:  Principal Problem Covid-19 Viral Illness -He was negative on admission and then positive on screening on 11/15.  Consistent with true positive test test was repeated and cycle time was 30.  Patient is status post more than the vaccination February 2021.  Given incidental COVID-19 finding, he received monoclonal antibodies on 11/15. -He has remained asymptomatic, afebrile, will discontinue isolation from 10 days from positive test, last day to keep on contact is 11/25, will take of precautions on Friday 11/26  COVID-19 Labs  No results for input(s): DDIMER, FERRITIN, LDH, CRP in the last 72 hours.  Lab Results  Component Value Date   SARSCOV2NAA POSITIVE (A) 07/06/2020   SARSCOV2NAA POSITIVE (A) 07/06/2020   SARSCOV2NAA NEGATIVE 06/22/2020   SARSCOV2NAA NEGATIVE 02/12/2020   Active Problems Severe sepsis due to right lower lobe aspiration pneumonia and chronic sacral and left trochanteric osteomyelitis, POA -Sepsis physiology resolved, status post IV fluids and midodrine, currently on antibiotics with  metronidazole as well as Ancef for 6 weeks with end date 08/05/2020.  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   Hyperlipidemia -Continue statin. LFTs wnl.  Seizure disorder -Continue home keppra and seizure precautions.   Trisomy 21  -noted  Macrocytic anemia: Folic acid and B12 wnl. No evidence of bleeding.  - Monitor CBC in AM  Hypernatremia  -Resolved with isotonic IVF.  -Encourage free water by mouth.  Hypokalemia: Improved.  -Continue to supplement as needed. -Will recheck BMP in AM.  PVCs, NSVT: No sustained VT.  -Started low dose metoprolol (hypotension improved) and monitor closely  -Continue regular Mg and K supplementation.    Scheduled Meds:  vitamin C  500 mg Oral Daily   aspirin  81 mg Oral Daily   Chlorhexidine Gluconate Cloth  6 each Topical Daily   cholecalciferol  1,000 Units Oral Daily   feeding supplement  237 mL Oral BID BM   feeding supplement (PRO-STAT SUGAR FREE 64)  30 mL Oral BID   finasteride  5 mg Oral Daily   heparin  5,000 Units Subcutaneous Q8H   Ipratropium-Albuterol  1 puff Inhalation Q6H   iron polysaccharides  150 mg Oral Daily   levETIRAcetam  500 mg Oral BID   loratadine  10 mg Oral Daily   mouth rinse  15 mL Mouth Rinse BID   metoprolol tartrate  12.5 mg Oral BID   metroNIDAZOLE  500 mg  Oral Q8H   nutrition supplement (JUVEN)  1 packet Oral BID BM   potassium chloride  40 mEq Oral Once   potassium chloride  40 mEq Oral Once   pravastatin  40 mg Oral Daily   saccharomyces boulardii  250 mg Oral BID   selenium sulfide   Topical Daily   sodium  chloride flush  10-40 mL Intracatheter Q12H   vitamin B-12  500 mcg Oral Daily   zinc sulfate  220 mg Oral Daily   Continuous Infusions:   ceFAZolin (ANCEF) IV 2 g (07/16/20 0634)   PRN Meds:.acetaminophen **OR** acetaminophen, bisacodyl, heparin lock flush, ketorolac, midodrine, ondansetron **OR** ondansetron (ZOFRAN) IV, sodium chloride flush, sodium phosphate  DVT prophylaxis: heparin Code Status: DNR Family Communication: no family at bedside   Diet Orders (From admission, onward)    Start     Ordered   07/09/20 0000  Diet - low sodium heart healthy        07/09/20 0912   07/01/20 1348  DIET - DYS 1 Room service appropriate? No; Fluid consistency: Thin  Diet effective now       Comments: Extra gravy/sauces  Question Answer Comment  Room service appropriate? No   Fluid consistency: Thin      07/01/20 1348          Status is: Inpatient  Remains inpatient appropriate because:Unsafe d/c plan   Dispo:  Patient From: Home  Planned Disposition: Skilled Nursing Facility  Expected discharge date: 07/17/20  Medically stable for discharge: YES  Consultants:  ID  Procedures:  None   Microbiology: None   Antibacterials: Cefazolin / Metronidazole    Objective: Vitals:   07/15/20 0641 07/15/20 1421 07/15/20 2220 07/16/20 0656  BP: 102/82 105/61 102/87 107/73  Pulse: 72 74 82 69  Resp: 16 16 16 20   Temp: 97.9 F (36.6 C) 98.6 F (37 C) 97.6 F (36.4 C) 98.1 F (36.7 C)  TempSrc: Oral Oral Oral   SpO2: 99% 94% 94% 95%  Weight:      Height:        Intake/Output Summary (Last 24 hours) at 07/16/2020 1125 Last data filed at 07/15/2020 1857 Gross per 24 hour  Intake 236 ml  Output --  Net 236 ml   Filed Weights   06/22/20 1713 07/01/20 0000  Weight: 40.8 kg (P) 45.4 kg    Examination:  Constitutional: NAD Respiratory: Clear bilaterally Cardiovascular: Regular rate and rhythm, no edema   Data Reviewed: I have independently reviewed following  labs and imaging studies   CBC: Recent Labs  Lab 07/10/20 0230 07/15/20 0427  WBC 3.3* 3.5*  NEUTROABS 2.4  --   HGB 9.2* 9.4*  HCT 30.9* 31.6*  MCV 104.0* 105.0*  PLT 232 230   Basic Metabolic Panel: Recent Labs  Lab 07/10/20 0230 07/15/20 0427 07/16/20 0445  NA 140 140 141  K 3.1* 3.3* 3.2*  CL 104 104 106  CO2 28 28 27   GLUCOSE 103* 101* 99  BUN 10 16 16   CREATININE 0.32* 0.46* 0.42*  CALCIUM 7.8* 8.0* 8.0*  MG  --   --  1.9   GFR: Estimated Creatinine Clearance: 60.2 mL/min (A) (by C-G formula based on SCr of 0.42 mg/dL (L)). Liver Function Tests: Recent Labs  Lab 07/10/20 0230  AST 13*  ALT 5  ALKPHOS 47  BILITOT 0.6  PROT 5.4*  ALBUMIN 2.2*   No results for input(s): LIPASE, AMYLASE in the last 168 hours. No results for input(s): AMMONIA  in the last 168 hours. Coagulation Profile: No results for input(s): INR, PROTIME in the last 168 hours. Cardiac Enzymes: No results for input(s): CKTOTAL, CKMB, CKMBINDEX, TROPONINI in the last 168 hours. BNP (last 3 results) No results for input(s): PROBNP in the last 8760 hours. HbA1C: No results for input(s): HGBA1C in the last 72 hours. CBG: Recent Labs  Lab 07/15/20 2155  GLUCAP 82   Lipid Profile: No results for input(s): CHOL, HDL, LDLCALC, TRIG, CHOLHDL, LDLDIRECT in the last 72 hours. Thyroid Function Tests: No results for input(s): TSH, T4TOTAL, FREET4, T3FREE, THYROIDAB in the last 72 hours. Anemia Panel: No results for input(s): VITAMINB12, FOLATE, FERRITIN, TIBC, IRON, RETICCTPCT in the last 72 hours. Urine analysis:    Component Value Date/Time   COLORURINE YELLOW 06/22/2020 2224   APPEARANCEUR CLEAR 06/22/2020 2224   LABSPEC 1.035 (H) 06/22/2020 2224   PHURINE 5.0 06/22/2020 2224   GLUCOSEU NEGATIVE 06/22/2020 2224   HGBUR NEGATIVE 06/22/2020 2224   BILIRUBINUR NEGATIVE 06/22/2020 2224   KETONESUR NEGATIVE 06/22/2020 2224   PROTEINUR NEGATIVE 06/22/2020 2224   NITRITE NEGATIVE  06/22/2020 2224   LEUKOCYTESUR TRACE (A) 06/22/2020 2224   Sepsis Labs: Invalid input(s): PROCALCITONIN, LACTICIDVEN  Recent Results (from the past 240 hour(s))  Respiratory Panel by RT PCR (Flu A&B, Covid) - Nasopharyngeal Swab     Status: Abnormal   Collection Time: 07/06/20 11:32 AM   Specimen: Nasopharyngeal Swab  Result Value Ref Range Status   SARS Coronavirus 2 by RT PCR POSITIVE (A) NEGATIVE Final    Comment: RESULT CALLED TO, READ BACK BY AND VERIFIED WITH: GIRLEY,K. RN @1414  07/06/20 BILLINGSLEY,L (NOTE) SARS-CoV-2 target nucleic acids are DETECTED.  SARS-CoV-2 RNA is generally detectable in upper respiratory specimens  during the acute phase of infection. Positive results are indicative of the presence of the identified virus, but do not rule out bacterial infection or co-infection with other pathogens not detected by the test. Clinical correlation with patient history and other diagnostic information is necessary to determine patient infection status. The expected result is Negative.  Fact Sheet for Patients:  https://www.moore.com/https://www.fda.gov/media/142436/download  Fact Sheet for Healthcare Providers: https://www.young.biz/https://www.fda.gov/media/142435/download  This test is not yet approved or cleared by the [place] FDA and  has been authorized for detection and/or diagnosis of SARS-CoV-2 by FDA under an Emergency Use Authorization (EUA).  This EUA will remain in effect (meaning this test  can be used) for the duration of  the COVID-19 declaration under Section 564(b)(1) of the Act, 21 U.S.C. section 360bbb-3(b)(1), unless the authorization is terminated or revoked sooner.      Influenza A by PCR NEGATIVE NEGATIVE Final   Influenza B by PCR NEGATIVE NEGATIVE Final    Comment: (NOTE) The Xpert Xpress SARS-CoV-2/FLU/RSV assay is intended as an aid in  the diagnosis of influenza from Nasopharyngeal swab specimens and  should not be used as a sole basis for treatment. Nasal washings and    aspirates are unacceptable for Xpert Xpress SARS-CoV-2/FLU/RSV  testing.  Fact Sheet for Patients: https://www.moore.com/https://www.fda.gov/media/142436/download  Fact Sheet for Healthcare Providers: https://www.young.biz/https://www.fda.gov/media/142435/download  This test is not yet approved or cleared by the [place] FDA and  has been authorized for detection and/or diagnosis of SARS-CoV-2 by  FDA under an Emergency Use Authorization (EUA). This EUA will remain  in effect (meaning this test can be used) for the duration of the  Covid-19 declaration under Section 564(b)(1) of the Act, 21  U.S.C. section 360bbb-3(b)(1), unless the authorization is  terminated or revoked.  Performed at [facility], [address], [place], [place] [address]   SARS Coronavirus 2 by RT PCR (hospital order, performed in [facility] hospital lab) Nasopharyngeal Nasopharyngeal Swab     Status: Abnormal   Collection Time: 07/06/20  3:20 PM   Specimen: Nasopharyngeal Swab  Result Value Ref Range Status   SARS Coronavirus 2 POSITIVE (A) NEGATIVE Final    Comment: CRITICAL RESULT CALLED TO, READ BACK BY AND VERIFIED WITH: WILLIAMS,B. RN @1735  07/06/20 BILLINGSLEY,L (NOTE) SARS-CoV-2 target nucleic acids are DETECTED  SARS-CoV-2 RNA is generally detectable in upper respiratory specimens  during the acute phase of infection.  Positive results are indicative  of the presence of the identified virus, but do not rule out bacterial infection or co-infection with other pathogens not detected by the test.  Clinical correlation with patient history and  other diagnostic information is necessary to determine patient infection status.  The expected result is negative.  Fact Sheet for Patients:   07/08/20   Fact Sheet for Healthcare Providers:   BoilerBrush.com.cy    This test is not yet approved or cleared by the https://pope.com/ FDA and  has been authorized for detection  and/or diagnosis of SARS-CoV-2 by FDA under an Emergency Use Authorization (EUA).  This EUA will remain in effec t (meaning this test can be used) for the duration of  the COVID-19 declaration under Section 564(b)(1) of the Act, 21 U.S.C. section 360-bbb-3(b)(1), unless the authorization is terminated or revoked sooner.  Performed at [facility], [address] [place], [place]       Radiology Studies: No results found.   [id number], MD, PhD [facility]  Between 7 am - 7 pm I am available, please contact me via Amion or Securechat  Between 7 pm - 7 am I am not available, please contact night coverage MD/APP via Amion

## 2020-07-17 DIAGNOSIS — M8600 Acute hematogenous osteomyelitis, unspecified site: Secondary | ICD-10-CM | POA: Diagnosis not present

## 2020-07-17 LAB — BASIC METABOLIC PANEL
Anion gap: 7 (ref 5–15)
BUN: 16 mg/dL (ref 6–20)
CO2: 28 mmol/L (ref 22–32)
Calcium: 8 mg/dL — ABNORMAL LOW (ref 8.9–10.3)
Chloride: 105 mmol/L (ref 98–111)
Creatinine, Ser: 0.48 mg/dL — ABNORMAL LOW (ref 0.61–1.24)
GFR, Estimated: >60 mL/min (ref >60)
Glucose, Bld: 96 mg/dL (ref 70–99)
Potassium: 3.4 mmol/L — ABNORMAL LOW (ref 3.5–5.1)
Sodium: 140 mmol/L (ref 135–145)

## 2020-07-17 MED ORDER — POTASSIUM CHLORIDE 20 MEQ/15ML (10%) PO SOLN
40.0000 meq | Freq: Once | ORAL | Status: AC
  Administered 2020-07-17: 40 meq via ORAL
  Filled 2020-07-17: qty 30

## 2020-07-17 NOTE — Plan of Care (Signed)
  Problem: Education: Goal: Knowledge of General Education information will improve Description: Including pain rating scale, medication(s)/side effects and non-pharmacologic comfort measures Outcome: Progressing   Problem: Health Behavior/Discharge Planning: Goal: Ability to manage health-related needs will improve Outcome: Progressing   Problem: Clinical Measurements: Goal: Ability to maintain clinical measurements within normal limits will improve Outcome: Progressing Goal: Will remain free from infection Outcome: Progressing Goal: Diagnostic test results will improve Outcome: Progressing Goal: Respiratory complications will improve Outcome: Progressing Goal: Cardiovascular complication will be avoided Outcome: Progressing   Problem: Activity: Goal: Risk for activity intolerance will decrease Outcome: Progressing   Problem: Nutrition: Goal: Adequate nutrition will be maintained Outcome: Progressing   Problem: Coping: Goal: Level of anxiety will decrease Outcome: Progressing   Problem: Elimination: Goal: Will not experience complications related to bowel motility Outcome: Progressing   Problem: Pain Managment: Goal: General experience of comfort will improve Outcome: Progressing   Problem: Safety: Goal: Ability to remain free from injury will improve Outcome: Progressing   Problem: Skin Integrity: Goal: Risk for impaired skin integrity will decrease Outcome: Progressing   Problem: Activity: Goal: Ability to tolerate increased activity will improve Outcome: Progressing   Problem: Respiratory: Goal: Ability to maintain a clear airway will improve Outcome: Progressing   Problem: Education: Goal: Knowledge of risk factors and measures for prevention of condition will improve Outcome: Progressing   Problem: Coping: Goal: Psychosocial and spiritual needs will be supported Outcome: Progressing   Problem: Respiratory: Goal: Will maintain a patent  airway Outcome: Progressing Goal: Complications related to the disease process, condition or treatment will be avoided or minimized Outcome: Progressing   

## 2020-07-17 NOTE — Progress Notes (Signed)
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  CT scan of the chest, abdomen and pelvis showed right lower lobe consolidation with mucoid material on the right lower lobe bronchus.  Imaging also showed decubitus ulceration and erosive changes to the sacrum.  Patient had an event on 11/now with hypotension requiring transfer to stepdown.  He was seen by ID Dr. Luciana Axe, who recommended intravenous antibiotics for osteomyelitis.  Subjective / 24h Interval events: Nonverbal, no distress  Assessment & Plan:  Principal Problem Covid-19 Viral Illness -He was negative on admission and then positive on screening on 11/15.  Consistent with true positive test test was repeated and cycle time was 30.  Patient is status post 54 more than the vaccination February 2021.  Given incidental COVID-19 finding, he received monoclonal antibodies on 11/15. -He has remained asymptomatic, afebrile, discontinue isolation today since 10 days have passed since his positive test and he was asymptomatic  COVID-19 Labs  No results for input(s): DDIMER, FERRITIN, LDH, CRP in the last 72 hours.  Lab Results  Component Value Date   SARSCOV2NAA POSITIVE (A) 07/06/2020   SARSCOV2NAA POSITIVE (A) 07/06/2020   SARSCOV2NAA NEGATIVE 06/22/2020   SARSCOV2NAA NEGATIVE 02/12/2020   Active Problems Severe sepsis due to right lower lobe aspiration pneumonia and chronic sacral and left trochanteric osteomyelitis, POA -Sepsis physiology resolved, status post IV fluids and midodrine, currently on antibiotics with metronidazole as well as Ancef for 6 weeks with end date  08/05/2020.  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   Hyperlipidemia -Continue statin. LFTs wnl.  Seizure disorder -Continue home keppra and seizure precautions.   Trisomy 21  -noted  Macrocytic anemia: Folic acid and B12 wnl. No evidence of bleeding.  - Monitor CBC in AM  Hypernatremia  -Resolved with isotonic IVF.  -Encourage free water by mouth.  Hypokalemia: Improved.  -Continue to supplement as needed. -Will recheck BMP in AM.  PVCs, NSVT: No sustained VT.  -Started low dose metoprolol (hypotension improved) and monitor closely  -Continue regular Mg and K supplementation.    Scheduled Meds: . vitamin C  500 mg Oral Daily  . aspirin  81 mg Oral Daily  . Chlorhexidine Gluconate Cloth  6 each Topical Daily  . cholecalciferol  1,000 Units Oral Daily  . feeding supplement  237 mL Oral BID BM  . feeding supplement (PRO-STAT SUGAR FREE 64)  30 mL Oral BID  . finasteride  5 mg Oral Daily  . heparin  5,000 Units Subcutaneous Q8H  . Ipratropium-Albuterol  1 puff Inhalation BID  . iron polysaccharides  150 mg Oral Daily  . levETIRAcetam  500 mg Oral BID  . loratadine  10 mg Oral Daily  . mouth rinse  15 mL Mouth Rinse BID  . metoprolol tartrate  12.5 mg Oral BID  . metroNIDAZOLE  500 mg Oral Q8H  . nutrition supplement (JUVEN)  1  packet Oral BID BM  . potassium chloride  40 mEq Oral Once  . pravastatin  40 mg Oral Daily  . saccharomyces boulardii  250 mg Oral BID  . selenium sulfide   Topical Daily  . sodium chloride flush  10-40 mL Intracatheter Q12H  . vitamin B-12  500 mcg Oral Daily  . zinc sulfate  220  mg Oral Daily   Continuous Infusions: .  ceFAZolin (ANCEF) IV 2 g (07/17/20 0518)   PRN Meds:.acetaminophen **OR** acetaminophen, bisacodyl, heparin lock flush, ketorolac, midodrine, ondansetron **OR** ondansetron (ZOFRAN) IV, sodium chloride flush, sodium phosphate  DVT prophylaxis: heparin Code Status: DNR Family Communication: no family at bedside   Diet Orders (From admission, onward)    Start     Ordered   07/09/20 0000  Diet - low sodium heart healthy        07/09/20 0912   07/01/20 1348  DIET - DYS 1 Room service appropriate? No; Fluid consistency: Thin  Diet effective now       Comments: Extra gravy/sauces  Question Answer Comment  Room service appropriate? No   Fluid consistency: Thin      07/01/20 1348          Status is: Inpatient  Remains inpatient appropriate because:Unsafe d/c plan   Dispo:  Patient From: Home  Planned Disposition: Skilled Nursing Facility  Expected discharge date: 07/17/20  Medically stable for discharge: YES  Consultants:  ID  Procedures:  None   Microbiology: None   Antibacterials: Cefazolin / Metronidazole    Objective: Vitals:   07/16/20 0656 07/16/20 1400 07/16/20 2051 07/17/20 0526  BP: 107/73 110/85 104/77 114/73  Pulse: 69 85 77 76  Resp: 20  20 20   Temp: 98.1 F (36.7 C) 97.9 F (36.6 C) 98 F (36.7 C) 98.3 F (36.8 C)  TempSrc:  Axillary Axillary Axillary  SpO2: 95% 94% 94% 93%  Weight:      Height:        Intake/Output Summary (Last 24 hours) at 07/17/2020 1009 Last data filed at 07/17/2020 0911 Gross per 24 hour  Intake 150 ml  Output 1550 ml  Net -1400 ml   Filed Weights   06/22/20 1713 07/01/20 0000  Weight: 40.8 kg (P) 45.4 kg    Examination:  Constitutional: No distress   Data Reviewed: I have independently reviewed following labs and imaging studies   CBC: Recent Labs  Lab 07/15/20 0427  WBC 3.5*  HGB 9.4*  HCT 31.6*  MCV 105.0*  PLT 230   Basic Metabolic Panel: Recent  Labs  Lab 07/15/20 0427 07/16/20 0445 07/17/20 0331  NA 140 141 140  K 3.3* 3.2* 3.4*  CL 104 106 105  CO2 28 27 28   GLUCOSE 101* 99 96  BUN 16 16 16   CREATININE 0.46* 0.42* 0.48*  CALCIUM 8.0* 8.0* 8.0*  MG  --  1.9  --    GFR: Estimated Creatinine Clearance: 60.2 mL/min (A) (by C-G formula based on SCr of 0.48 mg/dL (L)). Liver Function Tests: No results for input(s): AST, ALT, ALKPHOS, BILITOT, PROT, ALBUMIN in the last 168 hours. No results for input(s): LIPASE, AMYLASE in the last 168 hours. No results for input(s): AMMONIA in the last 168 hours. Coagulation Profile: No results for input(s): INR, PROTIME in the last 168 hours. Cardiac Enzymes: No results for input(s): CKTOTAL, CKMB, CKMBINDEX, TROPONINI in the last 168 hours. BNP (last 3 results) No results for input(s): PROBNP in the last 8760 hours. HbA1C: No results  for input(s): HGBA1C in the last 72 hours. CBG: Recent Labs  Lab 07/15/20 2155 07/16/20 1246 07/16/20 1638  GLUCAP 82 125* 92   Lipid Profile: No results for input(s): CHOL, HDL, LDLCALC, TRIG, CHOLHDL, LDLDIRECT in the last 72 hours. Thyroid Function Tests: No results for input(s): TSH, T4TOTAL, FREET4, T3FREE, THYROIDAB in the last 72 hours. Anemia Panel: No results for input(s): VITAMINB12, FOLATE, FERRITIN, TIBC, IRON, RETICCTPCT in the last 72 hours. Urine analysis:    Component Value Date/Time   COLORURINE YELLOW 06/22/2020 2224   APPEARANCEUR CLEAR 06/22/2020 2224   LABSPEC 1.035 (H) 06/22/2020 2224   PHURINE 5.0 06/22/2020 2224   GLUCOSEU NEGATIVE 06/22/2020 2224   HGBUR NEGATIVE 06/22/2020 2224   BILIRUBINUR NEGATIVE 06/22/2020 2224   KETONESUR NEGATIVE 06/22/2020 2224   PROTEINUR NEGATIVE 06/22/2020 2224   NITRITE NEGATIVE 06/22/2020 2224   LEUKOCYTESUR TRACE (A) 06/22/2020 2224   Sepsis Labs: Invalid input(s): PROCALCITONIN, LACTICIDVEN  No results found for this or any previous visit (from the past 240 hour(s)).     Radiology Studies: No results found.   Pamella Pert, MD, PhD [facility]  Between 7 am - 7 pm I am available, please contact me via Amion or Securechat  Between 7 pm - 7 am I am not available, please contact night coverage MD/APP via Amion

## 2020-07-18 DIAGNOSIS — M8600 Acute hematogenous osteomyelitis, unspecified site: Secondary | ICD-10-CM | POA: Diagnosis not present

## 2020-07-18 LAB — URINALYSIS, ROUTINE W REFLEX MICROSCOPIC
Bacteria, UA: NONE SEEN
Bilirubin Urine: NEGATIVE
Glucose, UA: NEGATIVE mg/dL
Hgb urine dipstick: NEGATIVE
Ketones, ur: 5 mg/dL — AB
Nitrite: NEGATIVE
Protein, ur: NEGATIVE mg/dL
Specific Gravity, Urine: 1.015 (ref 1.005–1.030)
WBC, UA: >50 WBC/hpf — ABNORMAL HIGH (ref 0–5)
pH: 5 (ref 5.0–8.0)

## 2020-07-18 NOTE — Plan of Care (Signed)
  Problem: Education: Goal: Knowledge of General Education information will improve Description: Including pain rating scale, medication(s)/side effects and non-pharmacologic comfort measures Outcome: Progressing   Problem: Health Behavior/Discharge Planning: Goal: Ability to manage health-related needs will improve Outcome: Progressing   Problem: Clinical Measurements: Goal: Ability to maintain clinical measurements within normal limits will improve Outcome: Progressing Goal: Will remain free from infection Outcome: Progressing Goal: Diagnostic test results will improve Outcome: Progressing Goal: Respiratory complications will improve Outcome: Progressing Goal: Cardiovascular complication will be avoided Outcome: Progressing   Problem: Activity: Goal: Risk for activity intolerance will decrease Outcome: Progressing   Problem: Nutrition: Goal: Adequate nutrition will be maintained Outcome: Progressing   Problem: Coping: Goal: Level of anxiety will decrease Outcome: Progressing   Problem: Elimination: Goal: Will not experience complications related to bowel motility Outcome: Progressing   Problem: Pain Managment: Goal: General experience of comfort will improve Outcome: Progressing   Problem: Safety: Goal: Ability to remain free from injury will improve Outcome: Progressing   Problem: Skin Integrity: Goal: Risk for impaired skin integrity will decrease Outcome: Progressing   Problem: Activity: Goal: Ability to tolerate increased activity will improve Outcome: Progressing   Problem: Respiratory: Goal: Ability to maintain a clear airway will improve Outcome: Progressing   Problem: Education: Goal: Knowledge of risk factors and measures for prevention of condition will improve Outcome: Progressing   Problem: Coping: Goal: Psychosocial and spiritual needs will be supported Outcome: Progressing   Problem: Respiratory: Goal: Will maintain a patent  airway Outcome: Progressing Goal: Complications related to the disease process, condition or treatment will be avoided or minimized Outcome: Progressing

## 2020-07-18 NOTE — Progress Notes (Signed)
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  CT scan of the chest, abdomen and pelvis showed right lower lobe consolidation with mucoid material on the right lower lobe bronchus.  Imaging also showed decubitus ulceration and erosive changes to the sacrum.  Patient had an event on 11/now with hypotension requiring transfer to stepdown.  He was seen by ID Dr. Luciana Axe, who recommended intravenous antibiotics for osteomyelitis.  Subjective / 24h Interval events: Alert, looks at me but is nonverbal.  Assessment & Plan:  Principal Problem Covid-19 Viral Illness -He was negative on admission and then positive on screening on 11/15.  Consistent with true positive test test was repeated and cycle time was 30.  Patient is status post more than the vaccination February 2021.  Given incidental COVID-19 finding, he received monoclonal antibodies on 11/15. -Remains asymptomatic, afebrile from Covid standpoint and isolation was discontinued 11/26  COVID-19 Labs  No results for input(s): DDIMER, FERRITIN, LDH, CRP in the last 72 hours.  Lab Results  Component Value Date   SARSCOV2NAA POSITIVE (A) 07/06/2020   SARSCOV2NAA POSITIVE (A) 07/06/2020   SARSCOV2NAA NEGATIVE 06/22/2020   SARSCOV2NAA NEGATIVE 02/12/2020   Active Problems Severe sepsis due to right lower lobe aspiration pneumonia and chronic sacral and left trochanteric osteomyelitis, POA -Sepsis physiology resolved, status post IV fluids and midodrine, currently on antibiotics with metronidazole as well as Ancef for 6 weeks with end date 08/05/2020 per infectious disease  recommendations.  Will need discharge to place where he could get antibiotics via PICC line  Acute hypoxic respiratory failure due to right lower lobe pneumonia -Resolved, on room air  Chronic pressure ulcer/sacral and left trochanteric chronic osteomyelitis -Continue Ancef and oral metronidazole with end date 12/15  Dysphagia -Continue dysphagia 1 diet  Subclinical hypothyroidism -TSH 7.7, repeat TSH in 3 weeks  Severe protein calorie malnutrition -Supplement protein as able.   Hyperlipidemia -Continue statin. LFTs wnl.  Seizure disorder -Continue home keppra and seizure precautions.   Trisomy 21  -noted  Macrocytic anemia: Folic acid and B12 wnl. No evidence of bleeding.  - Monitor CBC in AM  Hypernatremia  -Resolved with isotonic IVF.  -Encourage free water by mouth.  Hypokalemia: Improved.  -Continue to supplement as needed. -Will recheck BMP in AM.  PVCs, NSVT: No sustained VT.  -Started low dose metoprolol (hypotension improved) and monitor closely  -Continue regular Mg and K supplementation.    Scheduled Meds: . vitamin C  500 mg Oral Daily  . aspirin  81 mg Oral Daily  . Chlorhexidine Gluconate Cloth  6 each Topical Daily  . cholecalciferol  1,000 Units Oral Daily  . feeding supplement  237 mL Oral BID BM  . feeding supplement (PRO-STAT SUGAR FREE 64)  30 mL Oral BID  . finasteride  5 mg Oral Daily  . heparin  5,000 Units Subcutaneous Q8H  . Ipratropium-Albuterol  1 puff Inhalation BID  . iron polysaccharides  150 mg Oral Daily  . levETIRAcetam  500 mg Oral BID  . loratadine  10 mg Oral Daily  . mouth rinse  15 mL Mouth Rinse BID  . metoprolol tartrate  12.5 mg Oral BID  . metroNIDAZOLE  500 mg Oral Q8H  . nutrition supplement (JUVEN)  1 packet Oral BID BM  . potassium chloride  40 mEq Oral Once  . pravastatin  40 mg Oral Daily  . saccharomyces boulardii  250 mg Oral BID  . selenium sulfide   Topical Daily  . sodium chloride flush  10-40  mL Intracatheter Q12H  . vitamin B-12  500 mcg Oral Daily  . zinc sulfate  220 mg Oral Daily   Continuous Infusions: .  ceFAZolin (ANCEF) IV 2 g (07/18/20 0503)   PRN Meds:.acetaminophen **OR** acetaminophen, bisacodyl, heparin lock flush, midodrine, ondansetron **OR** ondansetron (ZOFRAN) IV, sodium chloride flush, sodium phosphate  DVT prophylaxis: heparin Code Status: DNR Family Communication: no family at bedside   Diet Orders (From admission, onward)    Start     Ordered   07/09/20 0000  Diet - low sodium heart healthy        07/09/20 0912   07/01/20 1348  DIET - DYS 1 Room service appropriate? No; Fluid consistency: Thin  Diet effective now       Comments: Extra gravy/sauces  Question Answer Comment  Room service appropriate? No   Fluid consistency: Thin      07/01/20 1348          Status is: Inpatient  Remains inpatient appropriate because:Unsafe d/c plan   Dispo:  Patient From: Home  Planned Disposition: Skilled Nursing Facility  Expected discharge date: 07/17/20  Medically stable for discharge: YES  Consultants:  ID  Procedures:  None   Microbiology: None   Antibacterials: Cefazolin / Metronidazole    Objective: Vitals:   07/17/20 0526 07/17/20 1422 07/17/20 2042 07/18/20 0418  BP: 114/73 119/79 109/73 96/72  Pulse: 76 79 60 (!) 59  Resp: 20 (!) 22 16 16   Temp: 98.3 F (36.8 C) 98.1 F (36.7 C) (!) 97.5 F (36.4 C) 97.9 F (36.6 C)  TempSrc: Axillary Oral Axillary Axillary  SpO2: 93% 98% 100% 98%  Weight:      Height:        Intake/Output Summary (Last 24 hours) at 07/18/2020 1148 Last data filed at 07/18/2020 0420 Gross per 24 hour  Intake 600 ml  Output 100 ml  Net 500 ml   Filed Weights   06/22/20 1713 07/01/20 0000  Weight: 40.8 kg (P) 45.4 kg    Examination:  Constitutional: No distress Pulmonary: Clear bilaterally CV: Regular, no edema   Data Reviewed: I have independently reviewed following labs and imaging  studies   CBC: Recent Labs  Lab 07/15/20 0427  WBC 3.5*  HGB 9.4*  HCT 31.6*  MCV 105.0*  PLT 230   Basic Metabolic Panel: Recent Labs  Lab 07/15/20 0427 07/16/20 0445 07/17/20 0331  NA 140 141 140  K 3.3* 3.2* 3.4*  CL 104 106 105  CO2 28 27 28   GLUCOSE 101* 99 96  BUN 16 16 16   CREATININE 0.46* 0.42* 0.48*  CALCIUM 8.0* 8.0* 8.0*  MG  --  1.9  --    GFR: Estimated Creatinine Clearance: 60.2 mL/min (A) (by C-G formula based on SCr of 0.48 mg/dL (L)). Liver Function Tests: No results for input(s): AST, ALT, ALKPHOS, BILITOT, PROT, ALBUMIN in the last 168 hours. No results for input(s): LIPASE, AMYLASE in the last 168 hours. No results for input(s): AMMONIA in the last 168 hours. Coagulation Profile: No results for input(s): INR, PROTIME in the last 168 hours. Cardiac Enzymes: No results for input(s): CKTOTAL, CKMB, CKMBINDEX, TROPONINI in the  last 168 hours. BNP (last 3 results) No results for input(s): PROBNP in the last 8760 hours. HbA1C: No results for input(s): HGBA1C in the last 72 hours. CBG: Recent Labs  Lab 07/15/20 2155 07/16/20 1246 07/16/20 1638  GLUCAP 82 125* 92   Lipid Profile: No results for input(s): CHOL, HDL, LDLCALC, TRIG, CHOLHDL, LDLDIRECT in the last 72 hours. Thyroid Function Tests: No results for input(s): TSH, T4TOTAL, FREET4, T3FREE, THYROIDAB in the last 72 hours. Anemia Panel: No results for input(s): VITAMINB12, FOLATE, FERRITIN, TIBC, IRON, RETICCTPCT in the last 72 hours. Urine analysis:    Component Value Date/Time   COLORURINE YELLOW 06/22/2020 2224   APPEARANCEUR CLEAR 06/22/2020 2224   LABSPEC 1.035 (H) 06/22/2020 2224   PHURINE 5.0 06/22/2020 2224   GLUCOSEU NEGATIVE 06/22/2020 2224   HGBUR NEGATIVE 06/22/2020 2224   BILIRUBINUR NEGATIVE 06/22/2020 2224   KETONESUR NEGATIVE 06/22/2020 2224   PROTEINUR NEGATIVE 06/22/2020 2224   NITRITE NEGATIVE 06/22/2020 2224   LEUKOCYTESUR TRACE (A) 06/22/2020 2224   Sepsis  Labs: Invalid input(s): PROCALCITONIN, LACTICIDVEN  No results found for this or any previous visit (from the past 240 hour(s)).    Radiology Studies: No results found.   Pamella Pert, MD, PhD [facility]  Between 7 am - 7 pm I am available, please contact me via Amion or Securechat  Between 7 pm - 7 am I am not available, please contact night coverage MD/APP via Amion

## 2020-07-19 DIAGNOSIS — M8600 Acute hematogenous osteomyelitis, unspecified site: Secondary | ICD-10-CM | POA: Diagnosis not present

## 2020-07-19 LAB — BASIC METABOLIC PANEL
Anion gap: 7 (ref 5–15)
BUN: 13 mg/dL (ref 6–20)
CO2: 30 mmol/L (ref 22–32)
Calcium: 8.1 mg/dL — ABNORMAL LOW (ref 8.9–10.3)
Chloride: 104 mmol/L (ref 98–111)
Creatinine, Ser: 0.54 mg/dL — ABNORMAL LOW (ref 0.61–1.24)
GFR, Estimated: >60 mL/min (ref >60)
Glucose, Bld: 90 mg/dL (ref 70–99)
Potassium: 3.8 mmol/L (ref 3.5–5.1)
Sodium: 141 mmol/L (ref 135–145)

## 2020-07-19 LAB — CBC
HCT: 33.3 % — ABNORMAL LOW (ref 39.0–52.0)
Hemoglobin: 9.9 g/dL — ABNORMAL LOW (ref 13.0–17.0)
MCH: 31.2 pg (ref 26.0–34.0)
MCHC: 29.7 g/dL — ABNORMAL LOW (ref 30.0–36.0)
MCV: 105 fL — ABNORMAL HIGH (ref 80.0–100.0)
Platelets: 295 10*3/uL (ref 150–400)
RBC: 3.17 MIL/uL — ABNORMAL LOW (ref 4.22–5.81)
RDW: 18.4 % — ABNORMAL HIGH (ref 11.5–15.5)
WBC: 4.5 10*3/uL (ref 4.0–10.5)
nRBC: 0 % (ref 0.0–0.2)

## 2020-07-19 NOTE — TOC Progression Note (Signed)
Transition of Care [facility]) - Progression Note    Patient Details  Name: Tracy Todd MRN: [id number] Date of Birth: Dec 25, 1965  Transition of Care [facility]) CM/SW Contact  Marina Goodell Phone Number: [phone] 07/19/2020, 1:08 PM  Clinical Narrative:     CSW spoke with patient's sister Jonerik Sliker [phone] for update on patient disposition.  Ms. Warnell stated she was told the patient will not be going to and [facility] due to his PICC line.  CSW confirmed this was the information I has as well.  Ms. Jue stated they are currently looking for a SNF placement. CSW looked at the current SNF facility bed offers and updated Ms. Ellers.  Ms.Ellers stated  She wa aware of the current bed offers, but did not want the patient to go to any of those facilities.  CSW verbalized understanding but also let Ms. Ellers know there would have to be a decision made on a facility once the patient was ready for discharge and the current facilities which have accepted the patient, may be the on,y ones that will accept him.  Ms. Cutbirth stated she understood that and if needed she would appeal the discharge.  CSW verbalized understanding and told Ms. Ellers she would be given the contact information for an appeal if it was necessary.  Ms. Klaus verbalized understanding.   Expected Discharge Plan: Group Home Barriers to Discharge: Barriers Resolved  Expected Discharge Plan and Services Expected Discharge Plan: Group Home       Living arrangements for the past 2 months: Single Family Home Expected Discharge Date: 07/13/20                                     Social Determinants of Health (SDOH) Interventions    Readmission Risk Interventions No flowsheet data found.

## 2020-07-19 NOTE — Progress Notes (Signed)
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  CT scan of the chest, abdomen and pelvis showed right lower lobe consolidation with mucoid material on the right lower lobe bronchus.  Imaging also showed decubitus ulceration and erosive changes to the sacrum.  Patient had an event on 11/now with hypotension requiring transfer to stepdown.  He was seen by ID Dr. Luciana Axe, who recommended intravenous antibiotics for osteomyelitis.  Subjective / 24h Interval events: Alert, nonverbal  Assessment & Plan:  Principal Problem Covid-19 Viral Illness -He was negative on admission and then positive on screening on 11/15.  Consistent with true positive test test was repeated and cycle time was 30.  Patient is status post more than the vaccination February 2021.  Given incidental COVID-19 finding, he received monoclonal antibodies on 11/15. -Remains asymptomatic, afebrile from Covid standpoint and isolation was discontinued 11/26  COVID-19 Labs  No results for input(s): DDIMER, FERRITIN, LDH, CRP in the last 72 hours.  Lab Results  Component Value Date   SARSCOV2NAA POSITIVE (A) 07/06/2020   SARSCOV2NAA POSITIVE (A) 07/06/2020   SARSCOV2NAA NEGATIVE 06/22/2020   SARSCOV2NAA NEGATIVE 02/12/2020   Active Problems Severe sepsis due to right lower lobe aspiration pneumonia and chronic sacral and left trochanteric osteomyelitis, POA -Sepsis physiology resolved, status post IV fluids and midodrine, currently on antibiotics with metronidazole as well as Ancef for 6 weeks with end date 08/05/2020 per infectious disease recommendations.  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   Hyperlipidemia -Continue statin. LFTs wnl.  Seizure disorder -Continue home keppra and seizure precautions.   Trisomy 21  -noted  Macrocytic anemia: Folic acid and B12 wnl. No evidence of bleeding.  - Monitor CBC in AM  Hypernatremia  -Resolved with isotonic IVF.  -Encourage free water by mouth.  Hypokalemia: Improved.  -Continue to supplement as needed. -Will recheck BMP in AM.  PVCs, NSVT: No sustained VT.  -Started low dose metoprolol (hypotension improved) and monitor closely  -Continue regular Mg and K supplementation.    Scheduled Meds: . vitamin C  500 mg Oral Daily  . aspirin  81 mg Oral Daily  . Chlorhexidine Gluconate Cloth  6 each Topical Daily  . cholecalciferol  1,000 Units Oral Daily  . feeding supplement  237 mL Oral BID BM  . feeding supplement (PRO-STAT SUGAR FREE 64)  30 mL Oral BID  . finasteride  5 mg Oral Daily  . heparin  5,000 Units Subcutaneous Q8H  . Ipratropium-Albuterol  1 puff Inhalation BID  . iron polysaccharides  150 mg Oral Daily  . levETIRAcetam  500 mg Oral BID  . mouth rinse  15 mL Mouth Rinse BID  . metoprolol tartrate  12.5 mg Oral BID  . metroNIDAZOLE  500 mg Oral Q8H  . nutrition supplement (JUVEN)  1 packet  Oral BID BM  . potassium chloride  40 mEq Oral Once  . pravastatin  40 mg Oral Daily  . saccharomyces boulardii  250 mg Oral BID  . selenium sulfide   Topical Daily  . sodium chloride flush  10-40 mL Intracatheter Q12H  . vitamin B-12  500 mcg Oral  Daily  . zinc sulfate  220 mg Oral Daily   Continuous Infusions: .  ceFAZolin (ANCEF) IV 2 g (07/19/20 0515)   PRN Meds:.acetaminophen **OR** acetaminophen, bisacodyl, heparin lock flush, midodrine, ondansetron **OR** ondansetron (ZOFRAN) IV, sodium chloride flush, sodium phosphate  DVT prophylaxis: heparin Code Status: DNR Family Communication: sister at bedside 11/27  Diet Orders (From admission, onward)    Start     Ordered   07/09/20 0000  Diet - low sodium heart healthy        07/09/20 0912   07/01/20 1348  DIET - DYS 1 Room service appropriate? No; Fluid consistency: Thin  Diet effective now       Comments: Extra gravy/sauces  Question Answer Comment  Room service appropriate? No   Fluid consistency: Thin      07/01/20 1348          Status is: Inpatient  Remains inpatient appropriate because:Unsafe d/c plan   Dispo:  Patient From: Home  Planned Disposition: Skilled Nursing Facility  Expected discharge date: 07/17/20  Medically stable for discharge: YES  Consultants:  ID  Procedures:  None   Microbiology: None   Antibacterials: Cefazolin / Metronidazole    Objective: Vitals:   07/18/20 0418 07/18/20 1427 07/18/20 2028 07/19/20 0436  BP: 96/72 108/79 106/76 104/72  Pulse: (!) 59 67 90 77  Resp: 16 16 16 16   Temp: 97.9 F (36.6 C) 97.9 F (36.6 C) 97.9 F (36.6 C) 97.9 F (36.6 C)  TempSrc: Axillary Oral Axillary Axillary  SpO2: 98% 99% 96% 97%  Weight:      Height:        Intake/Output Summary (Last 24 hours) at 07/19/2020 1249 Last data filed at 07/19/2020 0837 Gross per 24 hour  Intake 238 ml  Output 475 ml  Net -237 ml   Filed Weights   06/22/20 1713 07/01/20 0000  Weight: 40.8 kg (P) 45.4 kg    Examination:  Constitutional: NAD Pulmonary: CTA biL CV: RRR   Data Reviewed: I have independently reviewed following labs and imaging studies   CBC: Recent Labs  Lab 07/15/20 0427 07/19/20 0415  WBC 3.5* 4.5  HGB 9.4* 9.9*    HCT 31.6* 33.3*  MCV 105.0* 105.0*  PLT 230 295   Basic Metabolic Panel: Recent Labs  Lab 07/15/20 0427 07/16/20 0445 07/17/20 0331 07/19/20 0415  NA 140 141 140 141  K 3.3* 3.2* 3.4* 3.8  CL 104 106 105 104  CO2 28 27 28 30   GLUCOSE 101* 99 96 90  BUN 16 16 16 13   CREATININE 0.46* 0.42* 0.48* 0.54*  CALCIUM 8.0* 8.0* 8.0* 8.1*  MG  --  1.9  --   --    GFR: Estimated Creatinine Clearance: 60.2 mL/min (A) (by C-G formula based on SCr of 0.54 mg/dL (L)). Liver Function Tests: No results for input(s): AST, ALT, ALKPHOS, BILITOT, PROT, ALBUMIN in the last 168 hours. No results for input(s): LIPASE, AMYLASE in the last 168 hours. No results for input(s): AMMONIA in the last 168 hours. Coagulation Profile: No results for input(s): INR, PROTIME in the last 168 hours. Cardiac Enzymes: No results for input(s): CKTOTAL, CKMB, CKMBINDEX,  TROPONINI in the last 168 hours. BNP (last 3 results) No results for input(s): PROBNP in the last 8760 hours. HbA1C: No results for input(s): HGBA1C in the last 72 hours. CBG: Recent Labs  Lab 07/15/20 2155 07/16/20 1246 07/16/20 1638  GLUCAP 82 125* 92   Lipid Profile: No results for input(s): CHOL, HDL, LDLCALC, TRIG, CHOLHDL, LDLDIRECT in the last 72 hours. Thyroid Function Tests: No results for input(s): TSH, T4TOTAL, FREET4, T3FREE, THYROIDAB in the last 72 hours. Anemia Panel: No results for input(s): VITAMINB12, FOLATE, FERRITIN, TIBC, IRON, RETICCTPCT in the last 72 hours. Urine analysis:    Component Value Date/Time   COLORURINE YELLOW 07/18/2020 2300   APPEARANCEUR CLEAR 07/18/2020 2300   LABSPEC 1.015 07/18/2020 2300   PHURINE 5.0 07/18/2020 2300   GLUCOSEU NEGATIVE 07/18/2020 2300   HGBUR NEGATIVE 07/18/2020 2300   BILIRUBINUR NEGATIVE 07/18/2020 2300   KETONESUR 5 (A) 07/18/2020 2300   PROTEINUR NEGATIVE 07/18/2020 2300   NITRITE NEGATIVE 07/18/2020 2300   LEUKOCYTESUR LARGE (A) 07/18/2020 2300   Sepsis  Labs: Invalid input(s): PROCALCITONIN, LACTICIDVEN  No results found for this or any previous visit (from the past 240 hour(s)).    Radiology Studies: No results found.   Pamella Pert, MD, PhD [facility]  Between 7 am - 7 pm I am available, please contact me via Amion or Securechat  Between 7 pm - 7 am I am not available, please contact night coverage MD/APP via Amion

## 2020-07-20 DIAGNOSIS — U071 COVID-19: Secondary | ICD-10-CM | POA: Diagnosis not present

## 2020-07-20 DIAGNOSIS — J690 Pneumonitis due to inhalation of food and vomit: Secondary | ICD-10-CM | POA: Diagnosis not present

## 2020-07-20 DIAGNOSIS — M8600 Acute hematogenous osteomyelitis, unspecified site: Secondary | ICD-10-CM | POA: Diagnosis not present

## 2020-07-20 MED ORDER — CEFAZOLIN IV (FOR PTA / DISCHARGE USE ONLY): 2.0000 g | Freq: Three times a day (TID) | INTRAVENOUS | 0 refills | Status: AC

## 2020-07-20 MED ORDER — METRONIDAZOLE 500 MG PO TABS: 500.0000 mg | ORAL_TABLET | Freq: Three times a day (TID) | ORAL | 0 refills | Status: AC

## 2020-07-20 MED ORDER — KETOROLAC TROMETHAMINE 15 MG/ML IJ SOLN
15.0000 mg | Freq: Three times a day (TID) | INTRAMUSCULAR | Status: DC | PRN
  Administered 2020-07-20: 15 mg via INTRAVENOUS
  Filled 2020-07-20: qty 1

## 2020-07-20 MED ORDER — METOPROLOL TARTRATE 25 MG PO TABS: 12.5000 mg | ORAL_TABLET | Freq: Two times a day (BID) | ORAL | Status: AC

## 2020-07-20 NOTE — Progress Notes (Signed)
Report called to [facility] from [facility] at [phone].

## 2020-07-20 NOTE — [place] FL2 (Signed)
[place] MEDICAID FL2 LEVEL OF CARE SCREENING TOOL     IDENTIFICATION  Patient Name: Tracy Todd Birthdate: 08/28/1965 Sex: male Admission Date (Current Location): 06/22/2020  [facility] and [place] Number:  Producer, television/film/video and Address:  [facility],  [address], [place] [address]      Provider Number: [id number]  Attending Physician Name and Address:  Leatha Gilding, MD  Relative Name and Phone Number:  Quenton Recendez [phone]    Current Level of Care: Hospital Recommended Level of Care: Skilled Nursing Facility Prior Approval Number:    Date Approved/Denied:   PASRR Number: [id number]  Discharge Plan: SNF [facility])    Current Diagnoses: Patient Active Problem List   Diagnosis Date Noted  . Sepsis (HCC) 06/22/2020  . Seizure disorder (HCC) 05/21/2020  . Pressure injury of skin 02/06/2020  . Wound infection 02/05/2020  . Iron deficiency anemia 09/17/2019  . Elevated troponin 09/17/2019  . Syncope 09/16/2019  . Down syndrome 12/27/2012  . Arthritis 10/25/2011  . Hyperlipidemia 10/25/2011    Orientation RESPIRATION BLADDER Height & Weight     Self, Situation  Normal Incontinent Weight: (P) 45.4 kg Height:  (P) 5' (152.4 cm)  BEHAVIORAL SYMPTOMS/MOOD NEUROLOGICAL BOWEL NUTRITION STATUS   (none)   Incontinent Diet (see d/c summary)  AMBULATORY STATUS COMMUNICATION OF NEEDS Skin   Extensive Assist Verbally Other (Comment) (Pressure injuries; see d/c summary)                       Personal Care Assistance Level of Assistance  Total care Bathing Assistance: Maximum assistance Feeding assistance: Limited assistance Dressing Assistance: Maximum assistance     Functional Limitations Info  Sight Sight Info: Adequate Hearing Info: Adequate Speech Info: Impaired    SPECIAL CARE FACTORS FREQUENCY   (IV THERAPY)     PT Frequency: None OT Frequency: None            Contractures Contractures Info: Present    Additional  Factors Info  Code Status, Allergies Code Status Info: DNR Allergies Info: Penicillins           Current Medications (07/20/2020):  This is the current hospital active medication list Current Facility-Administered Medications  Medication Dose Route Frequency Provider Last Rate Last Admin  . acetaminophen (TYLENOL) tablet 650 mg  650 mg Oral Q6H PRN Zierle-Ghosh, Asia B, DO   650 mg at 07/18/20 1843   Or  . acetaminophen (TYLENOL) suppository 650 mg  650 mg Rectal Q6H PRN Zierle-Ghosh, Asia B, DO   650 mg at 06/30/20 2218  . ascorbic acid (VITAMIN C) tablet 500 mg  500 mg Oral Daily Dow Adolph N, DO   500 mg at 07/20/20 1041  . aspirin chewable tablet 81 mg  81 mg Oral Daily Len Childs T, RPH   81 mg at 07/20/20 1042  . bisacodyl (DULCOLAX) suppository 10 mg  10 mg Rectal Daily PRN Zierle-Ghosh, Asia B, DO      . ceFAZolin (ANCEF) IVPB 2g/100 mL premix  2 g Intravenous Q8H Gardiner Barefoot, MD 200 mL/hr at 07/20/20 0538 2 g at 07/20/20 0538  . Chlorhexidine Gluconate Cloth 2 % PADS 6 each  6 each Topical Daily Jerald Kief, MD   6 each at 07/20/20 1042  . cholecalciferol (VITAMIN D3) tablet 1,000 Units  1,000 Units Oral Daily Dow Adolph N, DO   1,000 Units at 07/20/20 1041  . feeding supplement (ENSURE ENLIVE / ENSURE PLUS) liquid  237 mL  237 mL Oral BID BM Zierle-Ghosh, Asia B, DO   237 mL at 07/20/20 1042  . feeding supplement (PRO-STAT SUGAR FREE 64) liquid 30 mL  30 mL Oral BID Zierle-Ghosh, Asia B, DO   30 mL at 07/20/20 1042  . finasteride (PROSCAR) tablet 5 mg  5 mg Oral Daily Zierle-Ghosh, Asia B, DO   5 mg at 07/20/20 1041  . heparin injection 5,000 Units  5,000 Units Subcutaneous Q8H Zierle-Ghosh, Asia B, DO   5,000 Units at 07/20/20 0535  . heparin lock flush 100 unit/mL  250 Units Intracatheter Prior to discharge Jerald Kief, MD      . Ipratropium-Albuterol (COMBIVENT) respimat 1 puff  1 puff Inhalation BID Leatha Gilding, MD   1 puff at 07/20/20 0815  . iron  polysaccharides (NIFEREX) capsule 150 mg  150 mg Oral Daily Zierle-Ghosh, Asia B, DO   150 mg at 07/20/20 1041  . ketorolac (TORADOL) 15 MG/ML injection 15 mg  15 mg Intravenous Q8H PRN Leatha Gilding, MD   15 mg at 07/20/20 1247  . levETIRAcetam (KEPPRA) 100 MG/ML solution 500 mg  500 mg Oral BID Dow Adolph N, DO   500 mg at 07/20/20 1042  . MEDLINE mouth rinse  15 mL Mouth Rinse BID Dow Adolph N, DO   15 mL at 07/20/20 1042  . metoprolol tartrate (LOPRESSOR) tablet 12.5 mg  12.5 mg Oral BID Tyrone Nine, MD   12.5 mg at 07/19/20 2124  . metroNIDAZOLE (FLAGYL) tablet 500 mg  500 mg Oral Q8H Gardiner Barefoot, MD   500 mg at 07/20/20 0536  . midodrine (PROAMATINE) tablet 10 mg  10 mg Oral TID WC PRN Dow Adolph N, DO   10 mg at 07/15/20 1137  . nutrition supplement (JUVEN) (JUVEN) powder packet 1 packet  1 packet Oral BID BM Zierle-Ghosh, Asia B, DO   1 packet at 07/20/20 1042  . ondansetron (ZOFRAN) tablet 4 mg  4 mg Oral Q6H PRN Zierle-Ghosh, Asia B, DO       Or  . ondansetron (ZOFRAN) injection 4 mg  4 mg Intravenous Q6H PRN Zierle-Ghosh, Asia B, DO      . potassium chloride SA (KLOR-CON) CR tablet 40 mEq  40 mEq Oral Once Leatha Gilding, MD      . pravastatin (PRAVACHOL) tablet 40 mg  40 mg Oral Daily Zierle-Ghosh, Asia B, DO   40 mg at 07/20/20 1041  . saccharomyces boulardii (FLORASTOR) capsule 250 mg  250 mg Oral BID Dow Adolph N, DO   250 mg at 07/20/20 1041  . selenium sulfide (SELSUN) 2.5 % shampoo   Topical Daily Jerald Kief, MD   Given at 07/20/20 1043  . sodium chloride flush (NS) 0.9 % injection 10-40 mL  10-40 mL Intracatheter Q12H Hall, Carole N, DO   10 mL at 07/20/20 1043  . sodium chloride flush (NS) 0.9 % injection 10-40 mL  10-40 mL Intracatheter PRN Dow Adolph N, DO   10 mL at 07/15/20 1140  . sodium phosphate (FLEET) 7-19 GM/118ML enema 1 enema  1 enema Rectal Once PRN Zierle-Ghosh, Asia B, DO      . vitamin B-12 (CYANOCOBALAMIN) tablet 500 mcg  500 mcg Oral  Daily Zierle-Ghosh, Asia B, DO   500 mcg at 07/20/20 1041  . zinc sulfate capsule 220 mg  220 mg Oral Daily Zierle-Ghosh, Asia B, DO   220 mg at 07/20/20 1041  Discharge Medications: Please see discharge summary for a list of discharge medications.  Relevant Imaging Results:  Relevant Lab Results:   Additional Information ssn:[phone]  Baldo Daub [place], [place]

## 2020-07-20 NOTE — TOC Transition Note (Signed)
Transition of Care [facility]) - CM/SW Discharge Note   Patient Details  Name: Blythe Hartshorn MRN: [id number] Date of Birth: 05-Dec-1965  [facility]) CM/SW Contact:  Ida Rogue, LCSW Phone Number: 07/20/2020, 1:26 PM   Clinical Narrative:   Patient to transition to [facility] today.  Sister alerted. PTAR arranged.  Nursing, please call report to [phone], room [phone].  TOC sign off.    Final next level of care: Skilled Nursing Facility Barriers to Discharge: Barriers Resolved   Patient Goals and CMS Choice Patient states their goals for this hospitalization and ongoing recovery are:: Home with 7/24 hrs care      Discharge Placement                       Discharge Plan and Services                                     Social Determinants of Health (SDOH) Interventions     Readmission Risk Interventions No flowsheet data found.

## 2020-07-20 NOTE — TOC Progression Note (Signed)
Transition of Care [facility]) - Progression Note    Patient Details  Name: Tracy Todd MRN: [id number] Date of Birth: [birth year]  Transition of Care [facility]) CM/SW Contact  Ida Rogue, [place] Phone Number: 07/20/2020, 10:37 AM  Clinical Narrative:  Spoke with Tracy Todd about dispositional plan.  I agreed to make a last ditch effort today to locate SNF locally that is willing to take Tracy Todd before sending him to a facility outside of [place]. TOC will continue to follow during the course of hospitalization.      Expected Discharge Plan: Group Home Barriers to Discharge: Barriers Resolved  Expected Discharge Plan and Services Expected Discharge Plan: Group Home       Living arrangements for the past 2 months: Single Family Home Expected Discharge Date: 07/20/20                                     Social Determinants of Health (SDOH) Interventions    Readmission Risk Interventions No flowsheet data found.

## 2020-07-20 NOTE — Discharge Summary (Signed)
Physician Discharge Summary  Devynn Scheff IRS:[id number] DOB: 21-Sep-1965 DOA: 06/22/2020  PCP: Chesley Noon, MD  Admit date: 06/22/2020 Discharge date: 07/20/2020  Admitted From: home Disposition:  SNF  Recommendations for Outpatient Follow-up:  1. Follow up with PCP in 1-2 weeks 2. Follow up with ID in 2-3 weeks  Home Health: none Equipment/Devices: none  Discharge Condition: stable CODE STATUS: DNR Diet recommendation: as tolerated   HPI: Per admitting MD, Asah Lamay  is a 54 y.o. male,  history of seizures, recurrent syncope, hyperlipidemia, iron deficiency anemia, Down syndrome, and more who presents to the ER with chief complaint of fever and cough.  Patient is nonverbal and was not able to provide any history whatsoever, but chart review reveals the patient was experiencing cough and shortness of breath.  Sister was at bedside for the ED provider and she is the primary caretaker as well as the power of attorney.  She reports that the home health aide head notified her of the fever at home, patient was tachycardic, patient seemed to be breathing faster than normal, and coughing.  There was concern for infection so they advised bring him into the ER.  Further chart review reveals that patient has been bedbound for the last 7 to 8 months with a steady and chronic decline in function.  He is on treatment with 2 antibiotics for his chronic osteomyelitis at home.  He follows with infectious disease.  Home medications show that cefdinir and Flagyl are likely these 2 antibiotics.  Sister had noted that patient is supposed to have a PICC line placement for IV antibiotics.  Hospital Course / Discharge diagnoses: Principal Problem Covid-19 Viral Illness -He was negative on admission and then positive on screening on 11/15.  Consistent with true positive test test was repeated and cycle time was 30.  Patient is status post Covid vaccination February 2021.  Given incidental COVID-19 finding,  he received monoclonal antibodies on 11/15. He remained asymptomatic, afebrile from Covid standpoint and isolation was discontinued 11/26  Active Problems Severe sepsis due to right lower lobe aspiration pneumonia and chronic sacral and left trochanteric osteomyelitis, POA -Sepsis physiology resolved, status post IV fluids and midodrine, currently on antibiotics with metronidazole as well as Ancef for 6 weeks with end date 08/05/2020 per infectious disease recommendations.  Will need discharge to place where he could get antibiotics via PICC line. Patient has SNF bed offers, will dc.  Alternatively, case was discussed again with Dr. Drucilla Schmidt and could finish 2 more weeks of Augmentin but family wishes for patient to remain on IV antibiotics Acute hypoxic respiratory failure due to right lower lobe pneumonia -Resolved, on room air Dysphagia -Continue dysphagia 1 diet Subclinical hypothyroidism -TSH 7.7, repeat TSH in 3 weeks Severe protein calorie malnutrition -Supplement protein as able.  Hyperlipidemia -Continue statin. LFTs wnl. Seizure disorder -Continue home keppra and seizure precautions.  Trisomy 21  -noted Macrocytic anemia: Folic acid and K09 wnl. No evidence of bleeding.  Hypernatremia  -Resolved with isotonic IVF.  Hypokalemia: Improved.  PVCs, NSVT: No sustained VT -Started low dose metoprolol  Discharge Instructions  Discharge Instructions    [facility] pharmacist to adjust dose for Vancomycin, Aminoglycosides and other anti-infective therapies as requested by physician.   Complete by: As directed    [facility] to provide Cath Flo 33m   Complete by: As directed    Administer for PICC line occlusion and as ordered by physician for other access device issues.  Anaphylaxis Kit: Provided to treat any anaphylactic reaction to the medication being provided to the patient if First Dose or when requested by physician   Complete by: As directed    Epinephrine  31m/ml vial / amp: Administer 0.356m(0.36m56msubcutaneously once for moderate to severe anaphylaxis, nurse to call physician and pharmacy when reaction occurs and call 911 if needed for immediate care   Diphenhydramine 27m436m IV vial: Administer 25-27mg27mIM PRN for first dose reaction, rash, itching, mild reaction, nurse to call physician and pharmacy when reaction occurs   Sodium Chloride 0.9% NS 500ml 51mAdminister if needed for hypovolemic blood pressure drop or as ordered by physician after call to physician with anaphylactic reaction   Change dressing (specify)   Complete by: As directed    Wound care to left hip and sacral Stage 3 Pressure Injuries:  Cleanse with NS, pat gently dry. Cover defects with size appropriate piece of silver hydrofiber (Aquacel Advantage, LawsonKellie [phone] with dry gauze and cover with silicone foam dressing. Change Aquacel Advantage daily, ,may reuse silicone foam for up to 3 days.  Change PRN for soiling or rolling of dressing edges.   Change dressing on IV access line weekly and PRN   Complete by: As directed    Diet - low sodium heart healthy   Complete by: As directed    Flush IV access with Sodium Chloride 0.9% and Heparin 10 units/ml or 100 units/ml   Complete by: As directed    Home infusion instructions - [facility]   Complete by: As directed    Instructions: Flush IV access with Sodium Chloride 0.9% and Heparin 10units/ml or 100units/ml   Change dressing on IV access line: Weekly and PRN   Instructions Cath Flo 2mg: A74mnister for PICC Line occlusion and as ordered by physician for other access device   [facility] pharmacist to adjust dose for: Vancomycin, Aminoglycosides and other anti-infective therapies as requested by physician   Increase activity slowly   Complete by: As directed    Method of administration may be changed at the discretion of home infusion pharmacist based upon assessment of the patient and/or  caregiver's ability to self-administer the medication ordered   Complete by: As directed      Allergies as of 07/20/2020      Reactions   Penicillins    Unknown Did it involve swelling of the face/tongue/throat, SOB, or low BP? Unk Did it involve sudden or severe rash/hives, skin peeling, or any reaction on the inside of your mouth or nose? Unk Did you need to seek medical attention at a hospital or doctor's office? Unk When did it last happen?childhood If all above answers are "NO", may proceed with cephalosporin use.      Medication List    STOP taking these medications   bisacodyl 10 MG suppository Commonly known as: DULCOLAX   cefdinir 300 MG capsule Commonly known as: OMNICEF   magnesium hydroxide 400 MG/5ML suspension Commonly known as: MILK OF MAGNESIA   UNABLE TO FIND     TAKE these medications   acetaminophen 500 MG tablet Commonly known as: TYLENOL Take 500 mg by mouth every 6 (six) hours as needed for moderate pain.   Americaine 20 % oral spray Generic drug: benzocaine Apply 1 application topically 3 (three) times daily as needed for throat irritation / pain.   aspirin 81 MG EC tablet Take 1 tablet (81 mg total) by mouth daily.   ceFAZolin  IVPB Commonly  known as: ANCEF Inject 2 g into the vein every 8 (eight) hours for 21 days. Indication:  Sacrococcygeal osteomyelitis First Dose: Yes Last Day of Therapy:  08/06/2020 Labs - Once weekly:  CBC/D and BMP, Labs - Every other week:  ESR and CRP Method of administration: IV Push Method of administration may be changed at the discretion of home infusion pharmacist based upon assessment of the patient and/or caregiver's ability to self-administer the medication ordered.   cetirizine 10 MG tablet Commonly known as: ZYRTEC Take 10 mg by mouth daily as needed for allergies.   Comfort Shield Adult Diapers Misc Dispense 5 boxes/mo. Use 4-6 times daily for fecal incontinence and diarrhea.   RA Adult  Wipes Misc Please dispense 4 boxes/wipes per month.   Underpads Medium Misc Dispense 3 boxes/mo for fecal and urinary incontinence and diarrhea.   diclofenac Sodium 1 % Gel Commonly known as: VOLTAREN Apply 2 g topically 4 (four) times daily. To both knees   Disposable Gloves Misc Use gloves for diaper changes.Dispense 2 boxes   Ensure Plus Liqd Take 237 mLs by mouth 2 (two) times daily between meals.   nutrition supplement (JUVEN) Pack Take 1 packet by mouth 2 (two) times daily between meals. To aid in wound healing.   feeding supplement (PRO-STAT SUGAR FREE 64) Liqd Take 30 mLs by mouth 2 (two) times daily. To aid in wound healing; resident prefers Prostat mixed in water.   FIBER ADULT GUMMIES PO Take 1 tablet by mouth in the morning and at bedtime.   finasteride 5 MG tablet Commonly known as: PROSCAR Take 5 mg by mouth daily.   hydrocortisone 2.5 % cream Apply 1 application topically 2 (two) times daily as needed (skin rash).   iron polysaccharides 150 MG capsule Commonly known as: Nu-Iron Take 1 capsule (150 mg total) by mouth daily.   ketoconazole 2 % shampoo Commonly known as: NIZORAL Apply 1 application topically See admin instructions. Use to wash hair and face, alternate by week.   levETIRAcetam 500 MG tablet Commonly known as: Keppra Take 1 tablet (500 mg total) by mouth 2 (two) times daily.   metoprolol tartrate 25 MG tablet Commonly known as: LOPRESSOR Take 0.5 tablets (12.5 mg total) by mouth 2 (two) times daily.   metroNIDAZOLE 500 MG tablet Commonly known as: FLAGYL Take 1 tablet (500 mg total) by mouth every 8 (eight) hours for 67 doses. Last date of therapy: 08/06/2020 What changed:   when to take this  additional instructions   Misc. Devices Misc Take by mouth.   Misc. Devices Misc Manual wheelchair. 16 inch wide with elevating leg rests, high reclining back and neck support and 16-inch cushion.   multivitamin with minerals tablet Take  1 tablet by mouth daily.   NON FORMULARY Regular liberalized diet d/t Dx Malnutrition.   pravastatin 40 MG tablet Commonly known as: PRAVACHOL Take 1 tablet (40 mg total) by mouth daily.   RA SALINE ENEMA RE If not relieved by Biscodyl suppository, give disposable Saline Enema rectally X 1 dose/24 hrs as needed (Do not use constipation standing orders for residents with renal failure/CFR less than 30. Contact MD for orders)(Physician Or   saccharomyces boulardii 250 MG capsule Commonly known as: FLORASTOR Take 1 capsule (250 mg total) by mouth 2 (two) times daily.   Santyl ointment Generic drug: collagenase Apply 1 application topically daily.   selenium sulfide 1 % Lotn Commonly known as: SELSUN Apply 1 application topically daily.   vitamin B-12 500  MCG tablet Commonly known as: CYANOCOBALAMIN Take 1 tablet (500 mcg total) by mouth daily.   zinc gluconate 50 MG tablet Take 1 tablet (50 mg total) by mouth daily.            Discharge Care Instructions  (From admission, onward)         Start     Ordered   07/09/20 0000  Change dressing (specify)       Comments: Wound care to left hip and sacral Stage 3 Pressure Injuries:  Cleanse with NS, pat gently dry. Cover defects with size appropriate piece of silver hydrofiber (Aquacel Advantage, Kellie Simmering [phone]). Top with dry gauze and cover with silicone foam dressing. Change Aquacel Advantage daily, ,may reuse silicone foam for up to 3 days.  Change PRN for soiling or rolling of dressing edges.   07/09/20 0912   07/06/20 0000  Change dressing on IV access line weekly and PRN  (Home infusion instructions - [facility] )        07/06/20 1043          Follow-up Information    Chesley Noon, MD. Call in 1 day(s).   Specialty: Family Medicine Why: Please call for a post hospital follow-up appointment. Contact information: [place] [address] [phone]        Jerline Pain, MD .     Specialty: Cardiology Contact information: [phone] [address] [place] [address] [phone]        Rosiland Oz, MD. Call in 1 day(s).   Specialty: Infectious Diseases Why: Please call for a post hospital follow-up appointment. Contact information: [place] [address] [phone]               Consultations:  ID  Procedures/Studies:  CT Chest W Contrast  Result Date: 06/22/2020 CLINICAL DATA:  Tachypnea, sacral decubitus ulcer, fever, sepsis EXAM: CT CHEST, ABDOMEN, AND PELVIS WITH CONTRAST TECHNIQUE: Multidetector CT imaging of the chest, abdomen and pelvis was performed following the standard protocol during bolus administration of intravenous contrast. CONTRAST:  152mL OMNIPAQUE IOHEXOL 300 MG/ML  SOLN COMPARISON:  06/22/2020, 09/16/2019, 02/05/2020 FINDINGS: CT CHEST FINDINGS Cardiovascular: The heart and great vessels are unremarkable without pericardial effusion. Normal caliber of the thoracic aorta. Mediastinum/Nodes: No enlarged mediastinal, hilar, or axillary lymph nodes. Thyroid gland, trachea, and esophagus demonstrate no significant findings. Lungs/Pleura: Dependent right lower lobe consolidation could reflect airspace disease or atelectasis. No effusion or pneumothorax. Mucoid material is seen within the right lower lobe bronchus. No effusion or pneumothorax. Musculoskeletal: No acute or destructive bony lesions. Reconstructed images demonstrate no additional findings. CT ABDOMEN PELVIS FINDINGS Hepatobiliary: No focal liver abnormality is seen. No gallstones, gallbladder wall thickening, or biliary dilatation. Pancreas: Unremarkable. No pancreatic ductal dilatation or surrounding inflammatory changes. Spleen: Normal in size without focal abnormality. Adrenals/Urinary Tract: There is a punctate calcification layering dependently within the bladder. No other urinary tract calculi or signs of obstruction. The kidneys  enhance normally and symmetrically. The adrenals are unremarkable. Stomach/Bowel: No bowel obstruction or ileus. No bowel wall thickening or inflammatory change. Vascular/Lymphatic: No significant vascular findings are present. No enlarged abdominal or pelvic lymph nodes. Reproductive: Prostate is unremarkable. Other: No free fluid or free gas. No abdominal wall hernia. Musculoskeletal: Persistent decubitus ulceration overlying the distal sacrum and coccyx. Since the prior exam, erosive changes are seen within the dorsal aspect of the lower sacrum at approximately the S3 and S4 levels.  No periosteal reaction. Bilateral L5 pars defects and grade 2 anterolisthesis of L5 on S1 unchanged. Stable chronic compression deformities involving the superior endplates of the L3 and L4 vertebral bodies. Severe spondylosis at L5/S1 unchanged. Reconstructed images demonstrate no additional findings. IMPRESSION: 1. Dependent right lower lobe consolidation with mucoid material in the right lower lobe bronchus. Findings could reflect mucous plugging, aspiration, or infection. 2. Persistent decubitus ulceration overlying the distal sacrum and coccyx. Since the prior exam, erosive changes within the dorsal aspect of the lower sacrum at approximately the S3 and S4 levels, consistent with chronic osteomyelitis. 3. Punctate nonobstructing calculus within the bladder lumen. Electronically Signed   By: Randa Ngo M.D.   On: 06/22/2020 21:46   CT ABDOMEN PELVIS W CONTRAST  Result Date: 06/22/2020 CLINICAL DATA:  Tachypnea, sacral decubitus ulcer, fever, sepsis EXAM: CT CHEST, ABDOMEN, AND PELVIS WITH CONTRAST TECHNIQUE: Multidetector CT imaging of the chest, abdomen and pelvis was performed following the standard protocol during bolus administration of intravenous contrast. CONTRAST:  164mL OMNIPAQUE IOHEXOL 300 MG/ML  SOLN COMPARISON:  06/22/2020, 09/16/2019, 02/05/2020 FINDINGS: CT CHEST FINDINGS Cardiovascular: The heart and great  vessels are unremarkable without pericardial effusion. Normal caliber of the thoracic aorta. Mediastinum/Nodes: No enlarged mediastinal, hilar, or axillary lymph nodes. Thyroid gland, trachea, and esophagus demonstrate no significant findings. Lungs/Pleura: Dependent right lower lobe consolidation could reflect airspace disease or atelectasis. No effusion or pneumothorax. Mucoid material is seen within the right lower lobe bronchus. No effusion or pneumothorax. Musculoskeletal: No acute or destructive bony lesions. Reconstructed images demonstrate no additional findings. CT ABDOMEN PELVIS FINDINGS Hepatobiliary: No focal liver abnormality is seen. No gallstones, gallbladder wall thickening, or biliary dilatation. Pancreas: Unremarkable. No pancreatic ductal dilatation or surrounding inflammatory changes. Spleen: Normal in size without focal abnormality. Adrenals/Urinary Tract: There is a punctate calcification layering dependently within the bladder. No other urinary tract calculi or signs of obstruction. The kidneys enhance normally and symmetrically. The adrenals are unremarkable. Stomach/Bowel: No bowel obstruction or ileus. No bowel wall thickening or inflammatory change. Vascular/Lymphatic: No significant vascular findings are present. No enlarged abdominal or pelvic lymph nodes. Reproductive: Prostate is unremarkable. Other: No free fluid or free gas. No abdominal wall hernia. Musculoskeletal: Persistent decubitus ulceration overlying the distal sacrum and coccyx. Since the prior exam, erosive changes are seen within the dorsal aspect of the lower sacrum at approximately the S3 and S4 levels. No periosteal reaction. Bilateral L5 pars defects and grade 2 anterolisthesis of L5 on S1 unchanged. Stable chronic compression deformities involving the superior endplates of the L3 and L4 vertebral bodies. Severe spondylosis at L5/S1 unchanged. Reconstructed images demonstrate no additional findings. IMPRESSION: 1.  Dependent right lower lobe consolidation with mucoid material in the right lower lobe bronchus. Findings could reflect mucous plugging, aspiration, or infection. 2. Persistent decubitus ulceration overlying the distal sacrum and coccyx. Since the prior exam, erosive changes within the dorsal aspect of the lower sacrum at approximately the S3 and S4 levels, consistent with chronic osteomyelitis. 3. Punctate nonobstructing calculus within the bladder lumen. Electronically Signed   By: Randa Ngo M.D.   On: 06/22/2020 21:46   DG CHEST PORT 1 VIEW  Result Date: 07/06/2020 CLINICAL DATA:  54 year old male with down syndrome, positive COVID-19. EXAM: PORTABLE CHEST 1 VIEW COMPARISON:  Portable chest 07/02/2020 and earlier. FINDINGS: Portable AP semi upright view at 1437 hours. Left upper extremity PICC line remains in place, tip at the lower SVC level. Increasing patchy and indistinct opacity in the left  mid and lower lung. Allowing for portable technique the right lung remains clear. Mediastinal contours remain normal. No pneumothorax or pleural effusion. No acute osseous abnormality identified. Visible bowel gas pattern is within normal limits. IMPRESSION: Increasing indistinct opacity in the left lung compatible with COVID-19 pneumonia. Right lung relatively spared. Electronically Signed   By: Genevie Ann M.D.   On: 07/06/2020 14:51   DG CHEST PORT 1 VIEW  Result Date: 07/02/2020 CLINICAL DATA:  Aspiration pneumonia EXAM: PORTABLE CHEST 1 VIEW COMPARISON:  Two days ago FINDINGS: Indistinct opacity at the lung bases. No visible effusion or pneumothorax. Normal heart size. Left PICC with tip at the upper right atrium. IMPRESSION: Stable infiltrates at the lung bases. Electronically Signed   By: Monte Fantasia M.D.   On: 07/02/2020 05:27   DG CHEST PORT 1 VIEW  Result Date: 06/30/2020 CLINICAL DATA:  Seizures, syncope, osteomyelitis EXAM: PORTABLE CHEST 1 VIEW COMPARISON:  06/22/2020 FINDINGS: Single  frontal view of the chest demonstrates an unremarkable cardiac silhouette. Left-sided PICC tip projects over the atrial caval junction. Persistent patchy consolidation within the medial right lung base. No effusion or pneumothorax. No acute bony abnormalities. IMPRESSION: 1. Persistent right lower lobe consolidation, not appreciably changed since prior CT. Favor mucous plugging/atelectasis over aspiration or infection. Electronically Signed   By: Randa Ngo M.D.   On: 06/30/2020 17:00   DG Chest Port 1 View  Result Date: 06/22/2020 CLINICAL DATA:  Sacral decubitus ulcer, sepsis, fever EXAM: PORTABLE CHEST 1 VIEW COMPARISON:  02/14/2020 FINDINGS: Single frontal view of the chest demonstrates a stable cardiac silhouette. Increased density at the medial right lung base could reflect atelectasis/scarring or airspace disease. There is slight elevation of the right hemidiaphragm, new since prior study. No effusion or pneumothorax. No acute bony abnormalities. IMPRESSION: 1. Increased density at the medial right lung base, with elevation of the right hemidiaphragm suggesting volume loss and atelectasis or scarring. Superimposed airspace disease cannot be completely excluded in the clinical scenario. Electronically Signed   By: Randa Ngo M.D.   On: 06/22/2020 17:32   DG Swallowing Func-Speech Pathology  Result Date: 06/24/2020 Objective Swallowing Evaluation: Type of Study: MBS-Modified Barium Swallow Study  Patient Details Name: Niccolas Loeper MRN: [id number] Date of Birth: 04-07-1966 Today's Date: 06/24/2020 Time: SLP Start Time (ACUTE ONLY): 1305 -SLP Stop Time (ACUTE ONLY): 1335 SLP Time Calculation (min) (ACUTE ONLY): 30 min Past Medical History: Past Medical History: Diagnosis Date . Arthritis  . Down's syndrome  . Headache  . Hyperlipidemia  . Iron deficiency anemia  . Recurrent syncope  . Seizure disorder ([place]) 05/21/2020 . Seizures ([place])  Past Surgical History: No past surgical history on file. HPI: 54yo  male admitted 06/22/20 with fever and cough with cxr concerning for aspiration pna. PMH: seizures, recurrent syncope, HLD, iron deficiency anemia, Down Syndrome, nonverbal, bedbound last 7-8 months with steady and chronic decline in function.  Pt underwent BSE with recommendation for MBS due to pt's pna and coughing post-swallow of minimal intake he would accept.  Subjective: pt awake in chair, required repositioning for view Assessment / Plan / Recommendation CHL IP CLINICAL IMPRESSIONS 06/24/2020 Clinical Impression Mild oropharyngeal dysphagia without aspiration or laryngeal penetration to vocal cord with any consistency tested.  Of note, pt did NOT cough during the study = which he is clinically conducting after his intake per notes from BSE.  Pt does present with delayed oral transiting, lingual pumping with foods requiring mastication due to discoordination.  Pharyngeal swallow was marginally  weak with decreased tongue base retraction which allows minimal retention of solids without explicit awareness.  Secretions also appeared retained in pharynx and mixed with retained barium.  Recommend pt continue regular/thin diet and medications with pudding - whole.  Advise pt start all intake with liquids and drink liquids during meal.  Sister present during the pt's MBS and was informed to findings/recommendations.  Since positioning is an issue for this pt, advise to consider reverse [place] for optimal safety with intake.  Will follow up briefly for education/po tolerance.  Advised pt's sister to mitigation strategies for this pt as he is unable to follow directions for strengthening exercises, etc. Reviewed MBS findings with pt's sister including pt's appearance of osteophytes along with deconditioning possibly impacting epiglottic deflection, contributing to dysphagia.  SLP Visit Diagnosis Dysphagia, oropharyngeal phase (R13.12) Attention and concentration deficit following -- Frontal lobe and executive  function deficit following -- Impact on safety and function Mild aspiration risk   CHL IP TREATMENT RECOMMENDATION 06/24/2020 Treatment Recommendations Therapy as outlined in treatment plan below   Prognosis 06/24/2020 Prognosis for Safe Diet Advancement Fair Barriers to Reach Goals Cognitive deficits Barriers/Prognosis Comment -- CHL IP DIET RECOMMENDATION 06/24/2020 SLP Diet Recommendations Regular solids;Thin liquid Liquid Administration via Cup;Straw Medication Administration Whole meds with puree Compensations Minimize environmental distractions;Slow rate;Small sips/bites Postural Changes Seated upright at 90 degrees;Remain semi-upright after after feeds/meals (Comment)   CHL IP OTHER RECOMMENDATIONS 06/24/2020 Recommended Consults -- Oral Care Recommendations Oral care QID Other Recommendations --   CHL IP FOLLOW UP RECOMMENDATIONS 06/24/2020 Follow up Recommendations 24 hour supervision/assistance   [facility] IP FREQUENCY AND DURATION 06/24/2020 Speech Therapy Frequency (ACUTE ONLY) -- Treatment Duration 1 week;2 weeks      [facility] IP ORAL PHASE 06/24/2020 Oral Phase WFL Oral - Pudding Teaspoon -- Oral - Pudding Cup -- Oral - Honey Teaspoon -- Oral - Honey Cup -- Oral - Nectar Teaspoon -- Oral - Nectar Cup -- Oral - Nectar Straw WFL Oral - Thin Teaspoon WFL Oral - Thin Cup -- Oral - Thin Straw WFL Oral - Puree WFL Oral - Mech Soft Premature spillage;Weak lingual manipulation;Lingual pumping;Reduced posterior propulsion;Delayed oral transit Oral - Regular -- Oral - Multi-Consistency -- Oral - Pill WFL Oral Phase - Comment --  [facility] IP PHARYNGEAL PHASE 06/24/2020 Pharyngeal Phase WFL Pharyngeal- Pudding Teaspoon -- Pharyngeal -- Pharyngeal- Pudding Cup -- Pharyngeal -- Pharyngeal- Honey Teaspoon -- Pharyngeal -- Pharyngeal- Honey Cup -- Pharyngeal -- Pharyngeal- Nectar Teaspoon -- Pharyngeal -- Pharyngeal- Nectar Cup -- Pharyngeal -- Pharyngeal- Nectar Straw WFL Pharyngeal Material does not enter airway Pharyngeal- Thin Teaspoon  WFL Pharyngeal Material does not enter airway Pharyngeal- Thin Cup -- Pharyngeal -- Pharyngeal- Thin Straw Penetration/Aspiration during swallow Pharyngeal Material enters airway, remains ABOVE vocal cords then ejected out Pharyngeal- Puree WFL Pharyngeal Material does not enter airway Pharyngeal- Mechanical Soft Delayed swallow initiation-vallecula;Pharyngeal residue - valleculae;Reduced epiglottic inversion;Reduced tongue base retraction Pharyngeal Material does not enter airway Pharyngeal- Regular -- Pharyngeal -- Pharyngeal- Multi-consistency -- Pharyngeal -- Pharyngeal- Pill WFL Pharyngeal Material does not enter airway Pharyngeal Comment --  CHL IP CERVICAL ESOPHAGEAL PHASE 06/24/2020 Cervical Esophageal Phase WFL Pudding Teaspoon -- Pudding Cup -- Honey Teaspoon -- Honey Cup -- Nectar Teaspoon -- Nectar Cup -- Nectar Straw -- Thin Teaspoon -- Thin Cup -- Thin Straw -- Puree -- Mechanical Soft -- Regular -- Multi-consistency -- Pill -- Cervical Esophageal Comment WFL Kathleen Lime, MS [facility] SLP Acute Rehab Services Office [phone] Pager [phone] [name] 06/24/2020, 2:04  PM              IRPICC PLACEMENT LEFT >5 YRS INC IMG GUIDE  Result Date: 06/25/2020 INDICATION: A 54 year old male with down syndrome and acute hematogenous osteomyelitis. He requires PICC line placement for outpatient IV antibiotic therapy. EXAM: PICC LINE PLACEMENT WITH ULTRASOUND AND FLUOROSCOPIC GUIDANCE MEDICATIONS: None. ANESTHESIA/SEDATION: 1 mg Versed given for anxiolysis. FLUOROSCOPY TIME:  Fluoroscopy Time: 0 minutes 18 seconds (1 mGy). COMPLICATIONS: None immediate. PROCEDURE: The patient was advised of the possible risks and complications and agreed to undergo the procedure. The patient was then brought to the angiographic suite for the procedure. The right/left arm was prepped with chlorhexidine, draped in the usual sterile fashion using maximum barrier technique (cap and mask, sterile gown, sterile gloves, large  sterile sheet, hand hygiene and cutaneous antisepsis) and infiltrated locally with 1% Lidocaine. Ultrasound demonstrated patency of the right basilic vein, and this was documented with an image. Under real-time ultrasound guidance, this vein was accessed with a 21 gauge micropuncture needle and image documentation was performed. A 0.018 wire was introduced in to the vein. Over this, a 5 [place] single lumen power-injectable PICC was advanced to the lower SVC/right atrial junction. Fluoroscopy during the procedure and fluoro spot radiograph confirms appropriate catheter position. The catheter was flushed and covered with a sterile dressing. Catheter length: 39 cm IMPRESSION: Successful right arm Power PICC line placement with ultrasound and fluoroscopic guidance. The catheter is ready for use. Electronically Signed   By: Jacqulynn Cadet M.D.   On: 06/25/2020 16:07   [place] EKG SITE RITE  Result Date: 07/01/2020 If Site Rite image not attached, placement could not be confirmed due to current cardiac rhythm.   Subjective: - no chest pain, shortness of breath, no abdominal pain, nausea or vomiting.   Discharge Exam: BP 98/63   Pulse 70   Temp 97.6 F (36.4 C)   Resp 16   Ht (P) 5' (1.524 m)   Wt (P) 45.4 kg   SpO2 93%   BMI (P) 19.53 kg/m   General: Pt is alert, awake, not in acute distress Cardiovascular: RRR, S1/S2 +, no rubs, no gallops Respiratory: CTA bilaterally, no wheezing, no rhonchi Abdominal: Soft, NT, ND, bowel sounds + Extremities: no edema, no cyanosis   The results of significant diagnostics from this hospitalization (including imaging, microbiology, ancillary and laboratory) are listed below for reference.     Microbiology: No results found for this or any previous visit (from the past 240 hour(s)).   Labs: Basic Metabolic Panel: Recent Labs  Lab 07/15/20 0427 07/16/20 0445 07/17/20 0331 07/19/20 0415  NA 140 141 140 141  K 3.3* 3.2* 3.4* 3.8  CL 104 106 105 104   CO2 28 27 28 30   GLUCOSE 101* 99 96 90  BUN 16 16 16 13   CREATININE 0.46* 0.42* 0.48* 0.54*  CALCIUM 8.0* 8.0* 8.0* 8.1*  MG  --  1.9  --   --    Liver Function Tests: No results for input(s): AST, ALT, ALKPHOS, BILITOT, PROT, ALBUMIN in the last 168 hours. CBC: Recent Labs  Lab 07/15/20 0427 07/19/20 0415  WBC 3.5* 4.5  HGB 9.4* 9.9*  HCT 31.6* 33.3*  MCV 105.0* 105.0*  PLT 230 295   CBG: Recent Labs  Lab 07/15/20 2155 07/16/20 1246 07/16/20 1638  GLUCAP 82 125* 92   Hgb A1c No results for input(s): HGBA1C in the last 72 hours. Lipid Profile No results for input(s): CHOL, HDL, LDLCALC, TRIG,  CHOLHDL, LDLDIRECT in the last 72 hours. Thyroid function studies No results for input(s): TSH, T4TOTAL, T3FREE, THYROIDAB in the last 72 hours.  Invalid input(s): FREET3 Urinalysis    Component Value Date/Time   COLORURINE YELLOW 07/18/2020 2300   APPEARANCEUR CLEAR 07/18/2020 2300   LABSPEC 1.015 07/18/2020 2300   PHURINE 5.0 07/18/2020 2300   GLUCOSEU NEGATIVE 07/18/2020 2300   HGBUR NEGATIVE 07/18/2020 2300   BILIRUBINUR NEGATIVE 07/18/2020 2300   KETONESUR 5 (A) 07/18/2020 2300   PROTEINUR NEGATIVE 07/18/2020 2300   NITRITE NEGATIVE 07/18/2020 2300   LEUKOCYTESUR LARGE (A) 07/18/2020 2300    FURTHER DISCHARGE INSTRUCTIONS:   Get Medicines reviewed and adjusted: Please take all your medications with you for your next visit with your Primary MD   Laboratory/radiological data: Please request your Primary MD to go over all hospital tests and procedure/radiological results at the follow up, please ask your Primary MD to get all Hospital records sent to his/her office.   In some cases, they will be blood work, cultures and biopsy results pending at the time of your discharge. Please request that your primary care M.D. goes through all the records of your hospital data and follows up on these results.   Also Note the following: If you experience worsening of your  admission symptoms, develop shortness of breath, life threatening emergency, suicidal or homicidal thoughts you must seek medical attention immediately by calling 911 or calling your MD immediately  if symptoms less severe.   You must read complete instructions/literature along with all the possible adverse reactions/side effects for all the Medicines you take and that have been prescribed to you. Take any new Medicines after you have completely understood and accpet all the possible adverse reactions/side effects.    Do not drive when taking Pain medications or sleeping medications (Benzodaizepines)   Do not take more than prescribed Pain, Sleep and Anxiety Medications. It is not advisable to combine anxiety,sleep and pain medications without talking with your primary care practitioner   Special Instructions: If you have smoked or chewed Tobacco  in the last 2 yrs please stop smoking, stop any regular Alcohol  and or any Recreational drug use.   Wear Seat belts while driving.   Please note: You were cared for by a hospitalist during your hospital stay. Once you are discharged, your primary care physician will handle any further medical issues. Please note that NO REFILLS for any discharge medications will be authorized once you are discharged, as it is imperative that you return to your primary care physician (or establish a relationship with a primary care physician if you do not have one) for your post hospital discharge needs so that they can reassess your need for medications and monitor your lab values.  Time coordinating discharge: 25 minutes  SIGNED:  Marzetta Board, MD, PhD 07/20/2020, 10:24 AM

## 2020-07-21 ENCOUNTER — Non-Acute Institutional Stay (SKILLED_NURSING_FACILITY): Payer: Medicare Other | Admitting: Internal Medicine

## 2020-07-21 ENCOUNTER — Encounter: Payer: Self-pay | Admitting: Internal Medicine

## 2020-07-21 DIAGNOSIS — R6520 Severe sepsis without septic shock: Secondary | ICD-10-CM

## 2020-07-21 DIAGNOSIS — E038 Other specified hypothyroidism: Secondary | ICD-10-CM

## 2020-07-21 DIAGNOSIS — Z66 Do not resuscitate: Secondary | ICD-10-CM

## 2020-07-21 DIAGNOSIS — D649 Anemia, unspecified: Secondary | ICD-10-CM

## 2020-07-21 DIAGNOSIS — L89304 Pressure ulcer of unspecified buttock, stage 4: Secondary | ICD-10-CM | POA: Diagnosis not present

## 2020-07-21 DIAGNOSIS — M170 Bilateral primary osteoarthritis of knee: Secondary | ICD-10-CM

## 2020-07-21 DIAGNOSIS — M4628 Osteomyelitis of vertebra, sacral and sacrococcygeal region: Secondary | ICD-10-CM | POA: Diagnosis not present

## 2020-07-21 DIAGNOSIS — E781 Pure hyperglyceridemia: Secondary | ICD-10-CM

## 2020-07-21 DIAGNOSIS — Q909 Down syndrome, unspecified: Secondary | ICD-10-CM

## 2020-07-21 DIAGNOSIS — G40909 Epilepsy, unspecified, not intractable, without status epilepticus: Secondary | ICD-10-CM

## 2020-07-21 DIAGNOSIS — J181 Lobar pneumonia, unspecified organism: Secondary | ICD-10-CM

## 2020-07-21 DIAGNOSIS — L89223 Pressure ulcer of left hip, stage 3: Secondary | ICD-10-CM

## 2020-07-21 DIAGNOSIS — A419 Sepsis, unspecified organism: Secondary | ICD-10-CM | POA: Diagnosis not present

## 2020-07-21 DIAGNOSIS — Z8616 Personal history of COVID-19: Secondary | ICD-10-CM | POA: Insufficient documentation

## 2020-07-21 NOTE — Progress Notes (Signed)
Provider:  Rexene Edison. Mariea Clonts, D.O., C.M.D. Location:  [place] Room Number: [place]:  SNF ([phone])  PCP: Chesley Noon, MD Patient Care Team: Chesley Noon, MD as PCP - General (Family Medicine) Jerline Pain, MD as PCP - Cardiology (Cardiology) Vicenta Aly, FNP as Nurse Practitioner (Nurse Practitioner)  Extended Emergency Contact Information Primary Emergency Contact: Bronson, Bressman Mobile Phone: [phone] Relation: Sister Secondary Emergency Contact: Lamount Cranker [place] [employer] Phone: [phone] Relation: Sister Father: Armbrust,William Address: [address] [place]          [address]          [place], [place] [address] [place] Phone: [phone] Mobile Phone: [phone]  Code Status: DNR Goals of Care: Advanced Directive information Advanced Directives 07/24/2020  Does Patient Have a Medical Advance Directive? Yes  Type of Advance Directive Out of facility DNR (pink MOST or yellow form)  Does patient want to make changes to medical advance directive? No - Patient declined  Copy of [place] in Chart? No - copy requested  Would patient like information on creating a medical advance directive? -  Pre-existing out of facility DNR order (yellow form or pink MOST form) Pink Most/Yellow Form available - Physician notified to receive inpatient order   Chief Complaint  Patient presents with  . New Admit To SNF    S/P long hospitalization at [facility] including covid infection here for skilled care     HPI: Patient is a 54 y.o. male seen today for admission to [facility] s/p prolonged hospitalization from November 1 to November 29 at [facility].  He comes to [place] for IV antibiotics through December 16.  He is noted to have contractures and require skilled care.[name] has a past medical history significant for seizure disorder, recurrent syncope,  hyperlipidemia, iron deficiency anemia, and Down syndrome among others.    He was sent to the emergency department on November 1 with fever, cough, and shortness of breath.  He is nonverbal and unable to provide history; however, his sister was at bedside and serves as his primary caretaker and healthcare power of attorney.  His home health aide had notified her of his fever, tachycardia and coughing.  He had been bedbound for the past 7 to 8 months with steady chronic decline in function.  He was already on treatment with 2 antibiotics via PICC line at home for chronic osteomyelitis (cefdinir and Flagyl).  In the emergency department his temp was 99.9 with a T-max of 102, respirations of 34 and oscillating blood pressure from hypotensive to extremely hypertensive.  Sats declined to 89% and he was placed on 2 L via nasal cannula.  He did not have leukocytosis or anemia.  Mild transaminitis.  Lactic acid was 1.5 and 1.9.  CT of the chest, abdomen and pelvis revealed a right lower lobe consolidation with mucoid material in the right lower lobe bronchus.  It also showed persistent pressure injury overlying the distal sacrum and coccyx.  He had erosive changes newly notable in the dorsal aspect of the lower sacrum.  He had a small nonobstructing calculus within the bladder.  He was started on vancomycin and cefepime in the emergency department and admitted for further management of severe sepsis secondary to pneumonia.  Sepsis physiology resolved after IV fluids and midodrine.  He is still completing his course of antibiotics with metronidazole and Ancef  via PICC for a total of 6 weeks to end on December 16 per ID.  He could alternatively have been placed on 2 weeks of Augmentin; however, his family wished for him to remain on the IV antibiotics.  He is to have weekly CBC with differential and BMP and every other week ESR and CRPs sent to infectious disease.  His acute respiratory failure also resolved.  He was  satting normally on room air.  Was continued on his home statin therapy for hyperlipidemia and home seizure therapy with Keppra.  Once had dysphagia none, still is on dysphagia 1 diet.    He was noted to have subclinical hypothyroidism with a TSH of 7.7 and repeat TSH was recommended in 3 weeks.  He had macrocytic anemia without evidence of bleeding and normal folic acid and Y19 levels.  He had hypernatremia which resolved with isotonic IV fluids and hypokalemia which improved.  He was noted to have PVCs and nonsustained VT and started on a low dose of metoprolol.  He was initially negative for Covid but tested + November 15.  He was given monoclonal antibodies that day.  He remained asymptomatic and afebrile in terms of Covid.  Isolation was discontinued November 26.  Discharge summary mentions both a wound to his left hip and a stage III sacral pressure injury.  He had his Covid vaccines on with [facility] January and February of this year.  Should he discharged home from [place] he will need to follow-up with Dr. Anastasia Pall family medicine, Dr. Candee Furbish from cardiology.  He is to have hospital follow-up with Dr. Rosiland Oz from infectious disease.  I met with his sister, Lattie Haw, during my visit.  She was concerned about his overall prognosis.  At this point, she is in favor of completing his antibiotic course and seeing how that goes; however, she does not want this to turn into something where he is suffering and not getting better.  I assured her that we can adjust his treatment plan at any point if this should happen and support him in the best way possible regardless.  Past Medical History:  Diagnosis Date  . Arthritis   . Down's syndrome   . Headache   . Hyperlipidemia   . Iron deficiency anemia   . Recurrent syncope   . Seizure disorder ([place]) 05/21/2020  . Seizures ([place])    History reviewed. No pertinent surgical history.  Social History   Socioeconomic  History  . Marital status: Single    Spouse name: Not on file  . Number of children: 0  . Years of education: Not on file  . Highest education level: Not on file  Occupational History  . Not on file  Tobacco Use  . Smoking status: Never Smoker  . Smokeless tobacco: Never Used  Vaping Use  . Vaping Use: Never used  Substance and Sexual Activity  . Alcohol use: Never  . Drug use: Never  . Sexual activity: Not on file  Other Topics Concern  . Not on file  Social History Narrative   05/21/20 lives in caregiver's home   Social Determinants of Health   Financial Resource Strain:   . Difficulty of Paying Living Expenses: Not on file  Food Insecurity:   . Worried About Charity fundraiser in the Last Year: Not on file  . Ran Out of Food in the Last Year: Not on file  Transportation Needs:   . Lack of Transportation (Medical): Not on  file  . Lack of Transportation (Non-Medical): Not on file  Physical Activity:   . Days of Exercise per Week: Not on file  . Minutes of Exercise per Session: Not on file  Stress:   . Feeling of Stress : Not on file  Social Connections:   . Frequency of Communication with Friends and Family: Not on file  . Frequency of Social Gatherings with Friends and Family: Not on file  . Attends Religious Services: Not on file  . Active Member of Clubs or Organizations: Not on file  . Attends Archivist Meetings: Not on file  . Marital Status: Not on file    reports that he has never smoked. He has never used smokeless tobacco. He reports that he does not drink alcohol and does not use drugs.  Functional Status Survey:    History reviewed. No pertinent family history.  Health Maintenance  Topic Date Due  . Hepatitis C Screening  Never done  . URINE MICROALBUMIN  Never done  . TETANUS/TDAP  Never done  . COLONOSCOPY  Never done  . INFLUENZA VACCINE  03/22/2020  . COVID-19 Vaccine  Completed  . HIV Screening  Completed    Allergies    Allergen Reactions  . Penicillins     Unknown Did it involve swelling of the face/tongue/throat, SOB, or low BP? Unk Did it involve sudden or severe rash/hives, skin peeling, or any reaction on the inside of your mouth or nose? Unk Did you need to seek medical attention at a hospital or doctor's office? Unk When did it last happen?childhood If all above answers are "NO", may proceed with cephalosporin use.      No facility-administered encounter medications on file as of 07/21/2020.   Outpatient Encounter Medications as of 07/21/2020  Medication Sig  . acetaminophen (TYLENOL) 500 MG tablet Take 500 mg by mouth every 6 (six) hours as needed for moderate pain.   . Amino Acids-Protein Hydrolys (FEEDING SUPPLEMENT, PRO-STAT SUGAR FREE 64,) LIQD Take 30 mLs by mouth 2 (two) times daily. To aid in wound healing; resident prefers Prostat mixed in water.  Marland Kitchen aspirin 81 MG EC tablet Take 1 tablet (81 mg total) by mouth daily.  Marland Kitchen ceFAZolin (ANCEF) IVPB Inject 2 g into the vein every 8 (eight) hours for 21 days. Indication:  Sacrococcygeal osteomyelitis First Dose: Yes Last Day of Therapy:  08/06/2020 Labs - Once weekly:  CBC/D and BMP, Labs - Every other week:  ESR and CRP Method of administration: IV Push Method of administration may be changed at the discretion of home infusion pharmacist based upon assessment of the patient and/or caregiver's ability to self-administer the medication ordered.  . cetirizine (ZYRTEC) 10 MG tablet Take 10 mg by mouth daily as needed for allergies.   Marland Kitchen diclofenac Sodium (VOLTAREN) 1 % GEL Apply 2 g topically 4 (four) times daily. To both knees  . Disposable Gloves MISC Use gloves for diaper changes.Dispense 2 boxes  . Ensure Plus (ENSURE PLUS) LIQD Take 237 mLs by mouth 3 (three) times daily between meals.   . finasteride (PROSCAR) 5 MG tablet Take 5 mg by mouth daily.  . hydrocortisone 2.5 % cream Apply 1 application topically 2 (two) times daily as  needed (skin rash).   . Incontinence Supply Disposable (COMFORT SHIELD ADULT DIAPERS) MISC Dispense 5 boxes/mo. Use 4-6 times daily for fecal incontinence and diarrhea.  . Incontinence Supply Disposable (RA ADULT WIPES) MISC Please dispense 4 boxes/wipes per month.  . Incontinence  Supply Disposable (UNDERPADS MEDIUM) MISC Dispense 3 boxes/mo for fecal and urinary incontinence and diarrhea.  . iron polysaccharides (NU-IRON) 150 MG capsule Take 1 capsule (150 mg total) by mouth daily.  Marland Kitchen ketoconazole (NIZORAL) 2 % shampoo Apply 1 application topically See admin instructions. Use to wash hair and face, alternate by week. (Patient not taking: Reported on 08/05/2020)  . levETIRAcetam (KEPPRA) 500 MG tablet Take 1 tablet (500 mg total) by mouth 2 (two) times daily.  . metoprolol tartrate (LOPRESSOR) 25 MG tablet Take 0.5 tablets (12.5 mg total) by mouth 2 (two) times daily.  . metroNIDAZOLE (FLAGYL) 500 MG tablet Take 1 tablet (500 mg total) by mouth every 8 (eight) hours for 67 doses. Last date of therapy: 08/06/2020  . Misc. Devices MISC Manual wheelchair. 16 inch wide with elevating leg rests, high reclining back and neck support and 16-inch cushion.  . Multiple Vitamins-Minerals (MULTIVITAMIN WITH MINERALS) tablet Take 1 tablet by mouth daily.  . NON FORMULARY Regular liberalized diet d/t Dx Malnutrition.   . nutrition supplement, JUVEN, (JUVEN) PACK Take 1 packet by mouth 2 (two) times daily between meals. To aid in wound healing.  . saccharomyces boulardii (FLORASTOR) 250 MG capsule Take 1 capsule (250 mg total) by mouth 2 (two) times daily.  . Sodium Phosphates (RA SALINE ENEMA RE) Place 1 application rectally daily as needed (constipation). If not relieved by Biscodyl suppository, give disposable Saline Enema rectally X 1 dose/24 hrs as needed (Do not use constipation standing orders for residents with renal failure/CFR less than 30. Contact MD for orders)(Physician Or  . vitamin B-12  (CYANOCOBALAMIN) 500 MCG tablet Take 1 tablet (500 mcg total) by mouth daily.  Marland Kitchen zinc gluconate 50 MG tablet Take 1 tablet (50 mg total) by mouth daily.  . [DISCONTINUED] benzocaine (AMERICAINE) 20 % oral spray Apply 1 application topically 3 (three) times daily as needed for throat irritation / pain.   . [DISCONTINUED] FIBER ADULT GUMMIES PO Take 1 tablet by mouth in the morning and at bedtime.   . [DISCONTINUED] Misc. Devices MISC Take by mouth.   . [DISCONTINUED] pravastatin (PRAVACHOL) 40 MG tablet Take 1 tablet (40 mg total) by mouth daily.  . [DISCONTINUED] SANTYL ointment Apply 1 application topically daily.   . [DISCONTINUED] selenium sulfide (SELSUN) 1 % LOTN Apply 1 application topically daily.    Review of Systems  Constitutional: Positive for malaise/fatigue and weight loss. Negative for chills and fever.       Per nursing and sister  HENT: Negative for congestion and sore throat.   Eyes: Negative for blurred vision.  Respiratory: Negative for cough and shortness of breath.   Cardiovascular: Negative for chest pain, palpitations and leg swelling.  Gastrointestinal: Negative for abdominal pain, blood in stool, constipation and melena.  Genitourinary: Negative for dysuria.  Musculoskeletal: Negative for falls and joint pain.  Neurological: Positive for weakness. Negative for dizziness and loss of consciousness.  Psychiatric/Behavioral: Positive for memory loss. Negative for depression. The patient is not nervous/anxious and does not have insomnia.     Vitals:   07/21/20 1513  BP: 99/69  Pulse: 82  Temp: 98 F (36.7 C)  Weight: 90 lb (40.8 kg)  Height: 5' 1"  (1.549 m)   Body mass index is 17.01 kg/m. Physical Exam Vitals reviewed.  Constitutional:      General: He is not in acute distress.    Appearance: He is ill-appearing.     Comments: Has lost considerable weight; Down's facies  HENT:  Head: Normocephalic and atraumatic.     Right Ear: External ear normal.      Left Ear: External ear normal.     Nose: Nose normal.     Mouth/Throat:     Pharynx: Oropharynx is clear.  Eyes:     Conjunctiva/sclera: Conjunctivae normal.     Pupils: Pupils are equal, round, and reactive to light.  Cardiovascular:     Rate and Rhythm: Normal rate and regular rhythm.  Pulmonary:     Effort: Pulmonary effort is normal.     Breath sounds: Normal breath sounds. No wheezing, rhonchi or rales.  Abdominal:     General: Bowel sounds are normal. There is no distension.     Palpations: Abdomen is soft.     Tenderness: There is no abdominal tenderness. There is no guarding or rebound.  Musculoskeletal:     Cervical back: Neck supple.     Right lower leg: No edema.     Left lower leg: No edema.     Comments: Contractures of extremities  Lymphadenopathy:     Cervical: No cervical adenopathy.  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 Moderate amount of serous exudate noted. No indications of infection; no erythema, warmth or induration noted. No odor noted. No indications of pain with wound care. Periwound intact. No periwound maceration noted. 25% slough tissue/75%granulation tissue to wound bed. Pressure Ulcer//Left Hip//.Marland KitchenMarland KitchenSTAGE 3 PRESURE INJURY TO LEFT HIP: measures 2.0 x 2.0 x 0.5cm; 3.5cm undermining from 5o'clock to 11o'clock. Small amount of serous exudate noted. Pink indurated tissue noted to periwound. No periwound maceration noted. No indications of infection noted; no erythema, warmth or induration noted. No indications of pain with wound care.   Neurological:     Mental Status: He is alert. Mental status is at baseline.     Motor: Weakness present.     Gait: Gait abnormal.  Psychiatric:        Mood and Affect: Mood normal.     Comments: Lattie Haw helped encourage him to permit exam     Labs reviewed: Basic Metabolic Panel: Recent Labs    07/03/20 0555  07/04/20 0500 07/06/20 0705 07/07/20 0410 07/16/20 0445 07/17/20 0331 08/10/2020 1000 08/01/2020 1400 07/26/20 0450  NA 141   < > 139   < > 141   < > 149* 148* 151*  K 4.0   < > 4.1   < > 3.2*   < > 3.6 3.5 3.5  CL 112*   < > 104   < > 106   < > 112* 112* 113*  CO2 23   < > 30   < > 27   < > 28 30 29   GLUCOSE 97   < > 125*   < > 99   < > 128* 107* 99  BUN 9   < > 14   < > 16   < > 47* 35* 22*  CREATININE 0.41*   < > 0.44*   < > 0.42*   < > 0.79 0.67 0.56*  CALCIUM 7.0*   < > 7.8*   < > 8.0*   < > 8.2* 8.1* 8.0*  MG 1.7   < > 2.0  --  1.9  --   --   --  1.9  PHOS 2.9  --   --   --   --   --   --   --  2.7   < > = values in this interval not displayed.   Liver Function Tests: Recent Labs    07/10/20 0230 07/24/2020 1000 07/26/20 0450  AST 13* 32 31  ALT 5 5 5   ALKPHOS 47 40 36*  BILITOT 0.6 0.2* 0.7  PROT 5.4* 5.7* 5.1*  ALBUMIN 2.2* 2.3* 1.9*   No results for input(s): LIPASE, AMYLASE in the last 8760 hours. No results for input(s): AMMONIA in the last 8760 hours. CBC: Recent Labs    07/29/2020 1000 07/31/2020 1400 07/26/20 0450  WBC 0.8* 0.9* 1.3*  NEUTROABS 0.5* 0.5* 0.7*  HGB 11.1* 10.9* 10.0*  HCT 37.7* 37.0* 35.4*  MCV 110.9* 110.4* 113.1*  PLT 327 288 222   Cardiac Enzymes: No results for input(s): CKTOTAL, CKMB, CKMBINDEX, TROPONINI in the last 8760 hours. BNP: Invalid input(s): POCBNP Lab Results  Component Value Date   HGBA1C 5.3 12/02/2019   Lab Results  Component Value Date   TSH 0.876 07/26/2020   Lab Results  Component Value Date   HQRFXJOI32 549 07/03/2020   Lab Results  Component Value Date   FOLATE 12.1 07/03/2020   Lab Results  Component Value Date   FERRITIN 121 07/07/2020    Imaging and Procedures obtained prior to SNF admission: CT Chest W Contrast  Result Date: 06/22/2020 CLINICAL DATA:  Tachypnea, sacral decubitus ulcer, fever, sepsis EXAM: CT CHEST, ABDOMEN, AND PELVIS WITH CONTRAST TECHNIQUE: Multidetector CT imaging of the  chest, abdomen and pelvis was performed following the standard protocol during bolus administration of intravenous contrast. CONTRAST:  151m OMNIPAQUE IOHEXOL 300 MG/ML  SOLN COMPARISON:  06/22/2020, 09/16/2019, 02/05/2020 FINDINGS: CT CHEST FINDINGS Cardiovascular: The heart and great vessels are unremarkable without pericardial effusion. Normal caliber of the thoracic aorta. Mediastinum/Nodes: No enlarged mediastinal, hilar, or axillary lymph nodes. Thyroid gland, trachea, and esophagus demonstrate no significant findings. Lungs/Pleura: Dependent right lower lobe consolidation could reflect airspace disease or atelectasis. No effusion or pneumothorax. Mucoid material is seen within the right lower lobe bronchus. No effusion or pneumothorax. Musculoskeletal: No acute or destructive bony lesions. Reconstructed images demonstrate no additional findings. CT ABDOMEN PELVIS FINDINGS Hepatobiliary: No focal liver abnormality is seen. No gallstones, gallbladder wall thickening, or biliary dilatation. Pancreas: Unremarkable. No pancreatic ductal dilatation or surrounding inflammatory changes. Spleen: Normal in size without focal abnormality. Adrenals/Urinary Tract: There is a punctate calcification layering dependently within the bladder. No other urinary tract calculi or signs of obstruction. The kidneys enhance normally and symmetrically. The adrenals are unremarkable. Stomach/Bowel: No bowel obstruction or ileus. No bowel wall thickening or inflammatory change. Vascular/Lymphatic: No significant vascular findings are present. No enlarged abdominal or pelvic lymph nodes. Reproductive: Prostate is unremarkable. Other: No free fluid or free gas. No abdominal wall hernia. Musculoskeletal: Persistent decubitus ulceration overlying the distal sacrum and coccyx. Since the prior exam, erosive changes are seen within the dorsal aspect of the lower sacrum at approximately the S3 and S4 levels. No periosteal reaction. Bilateral  L5 pars defects and grade 2 anterolisthesis of L5 on S1 unchanged. Stable chronic compression deformities involving the superior endplates of the L3 and L4 vertebral bodies. Severe spondylosis at L5/S1 unchanged. Reconstructed images demonstrate no additional findings. IMPRESSION: 1. Dependent right lower lobe consolidation with mucoid material in the right lower lobe bronchus. Findings could reflect mucous plugging, aspiration, or infection. 2. Persistent decubitus ulceration overlying the distal sacrum and coccyx. Since the prior exam, erosive changes within the dorsal aspect of the lower sacrum at approximately the S3 and  S4 levels, consistent with chronic osteomyelitis. 3. Punctate nonobstructing calculus within the bladder lumen. Electronically Signed   By: Randa Ngo M.D.   On: 06/22/2020 21:46   CT ABDOMEN PELVIS W CONTRAST  Result Date: 06/22/2020 CLINICAL DATA:  Tachypnea, sacral decubitus ulcer, fever, sepsis EXAM: CT CHEST, ABDOMEN, AND PELVIS WITH CONTRAST TECHNIQUE: Multidetector CT imaging of the chest, abdomen and pelvis was performed following the standard protocol during bolus administration of intravenous contrast. CONTRAST:  128m OMNIPAQUE IOHEXOL 300 MG/ML  SOLN COMPARISON:  06/22/2020, 09/16/2019, 02/05/2020 FINDINGS: CT CHEST FINDINGS Cardiovascular: The heart and great vessels are unremarkable without pericardial effusion. Normal caliber of the thoracic aorta. Mediastinum/Nodes: No enlarged mediastinal, hilar, or axillary lymph nodes. Thyroid gland, trachea, and esophagus demonstrate no significant findings. Lungs/Pleura: Dependent right lower lobe consolidation could reflect airspace disease or atelectasis. No effusion or pneumothorax. Mucoid material is seen within the right lower lobe bronchus. No effusion or pneumothorax. Musculoskeletal: No acute or destructive bony lesions. Reconstructed images demonstrate no additional findings. CT ABDOMEN PELVIS FINDINGS Hepatobiliary: No  focal liver abnormality is seen. No gallstones, gallbladder wall thickening, or biliary dilatation. Pancreas: Unremarkable. No pancreatic ductal dilatation or surrounding inflammatory changes. Spleen: Normal in size without focal abnormality. Adrenals/Urinary Tract: There is a punctate calcification layering dependently within the bladder. No other urinary tract calculi or signs of obstruction. The kidneys enhance normally and symmetrically. The adrenals are unremarkable. Stomach/Bowel: No bowel obstruction or ileus. No bowel wall thickening or inflammatory change. Vascular/Lymphatic: No significant vascular findings are present. No enlarged abdominal or pelvic lymph nodes. Reproductive: Prostate is unremarkable. Other: No free fluid or free gas. No abdominal wall hernia. Musculoskeletal: Persistent decubitus ulceration overlying the distal sacrum and coccyx. Since the prior exam, erosive changes are seen within the dorsal aspect of the lower sacrum at approximately the S3 and S4 levels. No periosteal reaction. Bilateral L5 pars defects and grade 2 anterolisthesis of L5 on S1 unchanged. Stable chronic compression deformities involving the superior endplates of the L3 and L4 vertebral bodies. Severe spondylosis at L5/S1 unchanged. Reconstructed images demonstrate no additional findings. IMPRESSION: 1. Dependent right lower lobe consolidation with mucoid material in the right lower lobe bronchus. Findings could reflect mucous plugging, aspiration, or infection. 2. Persistent decubitus ulceration overlying the distal sacrum and coccyx. Since the prior exam, erosive changes within the dorsal aspect of the lower sacrum at approximately the S3 and S4 levels, consistent with chronic osteomyelitis. 3. Punctate nonobstructing calculus within the bladder lumen. Electronically Signed   By: MRanda NgoM.D.   On: 06/22/2020 21:46   DG Chest Port 1 View  Result Date: 06/22/2020 CLINICAL DATA:  Sacral decubitus ulcer,  sepsis, fever EXAM: PORTABLE CHEST 1 VIEW COMPARISON:  02/14/2020 FINDINGS: Single frontal view of the chest demonstrates a stable cardiac silhouette. Increased density at the medial right lung base could reflect atelectasis/scarring or airspace disease. There is slight elevation of the right hemidiaphragm, new since prior study. No effusion or pneumothorax. No acute bony abnormalities. IMPRESSION: 1. Increased density at the medial right lung base, with elevation of the right hemidiaphragm suggesting volume loss and atelectasis or scarring. Superimposed airspace disease cannot be completely excluded in the clinical scenario. Electronically Signed   By: MRanda NgoM.D.   On: 06/22/2020 17:32    Assessment/Plan 1. Severe [place] -sepsis pathophysiology resolved -multifactorial with pneumonia and osteomyelitis -completing abx course with ID f/u  2. Lobar pneumonia [place] -completing course of abx  3. Pressure injury of buttock, stage  4, unspecified laterality ([place]) -cont current orders as follows:  cleanse with NS, pat dry, apply santyl, apply calcium alginate and cover with foam dressing QD. Pressure Guard mattress in place, settings to be at "3" at all times. Order in place for prevalon boots to bilateral feet qd, while in bed, may remove for AM/PM care. Orders in place for Juven, magic cup, Ensure, Multivitamin and Prostat will aide in wound healing.  4. Sacral osteomyelitis (HCC) -complete course of ancef and flagyl with regular checks of cbc, bmp and esr/crp per ID -f/u with them as planned -cont wound care as in pressure injury documentation -d/w his sister and she does not want him to be continued to be treated aggressively if his course of abx does not appear successful and there are signs that he is suffering  5. Pure hypertriglyceridemia -he has been on pravachol--d/c due to end stage disease  6. Seizure disorder ([place]) -continue keppra for prevention  7. Down  syndrome -with dementia -he is of advanced age with this disorder -goal is to treat with osteo and if not getting better, we can transition to comfort care with additional conversations with his sister  62. Primary osteoarthritis of both knees -continue on voltaren gel, can also use tylenol for pain if needed  9. Normocytic anemia -is on iron supplementation, encourage intake with small frequent snacks and supplements  10. Personal history of COVID-19 -had since he was here last time  79. Subclinical hypothyroidism -f/u lab to see if treatment needed, but likely TSH elevated in context of acute illness  12. DNR (do not resuscitate) -confirmed   17.  Stage 3 pressure injury left hip -cont current orders as follows:  cleanse with NS, pat dry, apply silver alginate and cover with foam dressing QD. Pressure Guard mattress in place, settings to be at "3" at all times. Order in place for prevalon boots to bilateral feet qd, while in bed, may remove for AM/PM care. Orders in place for Juven, magic cup, Ensure, Multivitamin and Prostat will aide in wound healing.   Family/ staff Communication: d/w sister and guardian, Lattie Haw, who was present for visit  Labs/tests ordered:  Tsh, cbc, bmp, esr, crp as per d/c summary  Kanai Berrios L. Shawnise Peterkin, D.O. [facility] [address],  [address] Cell Phone (Mon-Fri 8am-5pm):  [phone] On Call:  [phone] & follow prompts after 5pm & weekends Office Phone:  [phone] Office Fax:  [phone]

## 2020-07-25 ENCOUNTER — Other Ambulatory Visit: Payer: Self-pay

## 2020-07-25 ENCOUNTER — Inpatient Hospital Stay (HOSPITAL_COMMUNITY): Payer: Medicare Other

## 2020-07-25 ENCOUNTER — Emergency Department (HOSPITAL_COMMUNITY): Payer: Medicare Other

## 2020-07-25 ENCOUNTER — Encounter (HOSPITAL_COMMUNITY): Payer: Self-pay

## 2020-07-25 ENCOUNTER — Inpatient Hospital Stay (HOSPITAL_COMMUNITY)
Admission: EM | Admit: 2020-07-25 | Discharge: 2020-08-22 | DRG: 871 | Disposition: E | Payer: Medicare Other | Source: Skilled Nursing Facility | Attending: Internal Medicine | Admitting: Internal Medicine

## 2020-07-25 DIAGNOSIS — M4628 Osteomyelitis of vertebra, sacral and sacrococcygeal region: Secondary | ICD-10-CM | POA: Diagnosis present

## 2020-07-25 DIAGNOSIS — R64 Cachexia: Secondary | ICD-10-CM | POA: Diagnosis present

## 2020-07-25 DIAGNOSIS — Z7982 Long term (current) use of aspirin: Secondary | ICD-10-CM | POA: Diagnosis not present

## 2020-07-25 DIAGNOSIS — E038 Other specified hypothyroidism: Secondary | ICD-10-CM | POA: Diagnosis present

## 2020-07-25 DIAGNOSIS — E870 Hyperosmolality and hypernatremia: Secondary | ICD-10-CM | POA: Diagnosis present

## 2020-07-25 DIAGNOSIS — J9601 Acute respiratory failure with hypoxia: Secondary | ICD-10-CM | POA: Diagnosis present

## 2020-07-25 DIAGNOSIS — Z7401 Bed confinement status: Secondary | ICD-10-CM

## 2020-07-25 DIAGNOSIS — J189 Pneumonia, unspecified organism: Secondary | ICD-10-CM

## 2020-07-25 DIAGNOSIS — G40909 Epilepsy, unspecified, not intractable, without status epilepticus: Secondary | ICD-10-CM

## 2020-07-25 DIAGNOSIS — U099 Post covid-19 condition, unspecified: Secondary | ICD-10-CM | POA: Diagnosis present

## 2020-07-25 DIAGNOSIS — L89893 Pressure ulcer of other site, stage 3: Secondary | ICD-10-CM | POA: Diagnosis present

## 2020-07-25 DIAGNOSIS — Q909 Down syndrome, unspecified: Secondary | ICD-10-CM | POA: Diagnosis not present

## 2020-07-25 DIAGNOSIS — J690 Pneumonitis due to inhalation of food and vomit: Secondary | ICD-10-CM | POA: Diagnosis present

## 2020-07-25 DIAGNOSIS — R6520 Severe sepsis without septic shock: Secondary | ICD-10-CM | POA: Diagnosis present

## 2020-07-25 DIAGNOSIS — M1990 Unspecified osteoarthritis, unspecified site: Secondary | ICD-10-CM | POA: Diagnosis present

## 2020-07-25 DIAGNOSIS — Z515 Encounter for palliative care: Secondary | ICD-10-CM | POA: Diagnosis not present

## 2020-07-25 DIAGNOSIS — Z880 Allergy status to penicillin: Secondary | ICD-10-CM

## 2020-07-25 DIAGNOSIS — R519 Headache, unspecified: Secondary | ICD-10-CM | POA: Diagnosis present

## 2020-07-25 DIAGNOSIS — U071 COVID-19: Secondary | ICD-10-CM | POA: Diagnosis not present

## 2020-07-25 DIAGNOSIS — R1310 Dysphagia, unspecified: Secondary | ICD-10-CM | POA: Diagnosis present

## 2020-07-25 DIAGNOSIS — R159 Full incontinence of feces: Secondary | ICD-10-CM | POA: Diagnosis present

## 2020-07-25 DIAGNOSIS — Z66 Do not resuscitate: Secondary | ICD-10-CM | POA: Diagnosis present

## 2020-07-25 DIAGNOSIS — J80 Acute respiratory distress syndrome: Secondary | ICD-10-CM | POA: Diagnosis present

## 2020-07-25 DIAGNOSIS — L8990 Pressure ulcer of unspecified site, unspecified stage: Secondary | ICD-10-CM | POA: Diagnosis present

## 2020-07-25 DIAGNOSIS — Z681 Body mass index (BMI) 19 or less, adult: Secondary | ICD-10-CM

## 2020-07-25 DIAGNOSIS — E43 Unspecified severe protein-calorie malnutrition: Secondary | ICD-10-CM | POA: Diagnosis present

## 2020-07-25 DIAGNOSIS — E785 Hyperlipidemia, unspecified: Secondary | ICD-10-CM | POA: Diagnosis present

## 2020-07-25 DIAGNOSIS — T17908A Unspecified foreign body in respiratory tract, part unspecified causing other injury, initial encounter: Secondary | ICD-10-CM | POA: Diagnosis present

## 2020-07-25 DIAGNOSIS — R000 Tachycardia, unspecified: Secondary | ICD-10-CM | POA: Diagnosis present

## 2020-07-25 DIAGNOSIS — A419 Sepsis, unspecified organism: Secondary | ICD-10-CM | POA: Diagnosis present

## 2020-07-25 DIAGNOSIS — L89304 Pressure ulcer of unspecified buttock, stage 4: Secondary | ICD-10-CM | POA: Diagnosis present

## 2020-07-25 DIAGNOSIS — R55 Syncope and collapse: Secondary | ICD-10-CM | POA: Diagnosis present

## 2020-07-25 DIAGNOSIS — Z8616 Personal history of COVID-19: Secondary | ICD-10-CM | POA: Diagnosis present

## 2020-07-25 DIAGNOSIS — D509 Iron deficiency anemia, unspecified: Secondary | ICD-10-CM | POA: Diagnosis present

## 2020-07-25 DIAGNOSIS — Z79899 Other long term (current) drug therapy: Secondary | ICD-10-CM

## 2020-07-25 DIAGNOSIS — L89224 Pressure ulcer of left hip, stage 4: Secondary | ICD-10-CM | POA: Diagnosis present

## 2020-07-25 DIAGNOSIS — R0902 Hypoxemia: Secondary | ICD-10-CM

## 2020-07-25 DIAGNOSIS — D709 Neutropenia, unspecified: Secondary | ICD-10-CM | POA: Diagnosis present

## 2020-07-25 LAB — PROTIME-INR
INR: 1.7 — ABNORMAL HIGH (ref 0.8–1.2)
Prothrombin Time: 19.6 seconds — ABNORMAL HIGH (ref 11.4–15.2)

## 2020-07-25 LAB — CBC WITH DIFFERENTIAL/PLATELET
Abs Immature Granulocytes: 0.01 10*3/uL (ref 0.00–0.07)
Abs Immature Granulocytes: 0.01 10*3/uL (ref 0.00–0.07)
Basophils Absolute: 0 10*3/uL (ref 0.0–0.1)
Basophils Absolute: 0 10*3/uL (ref 0.0–0.1)
Basophils Relative: 1 %
Basophils Relative: 2 %
Eosinophils Absolute: 0 10*3/uL (ref 0.0–0.5)
Eosinophils Absolute: 0 10*3/uL (ref 0.0–0.5)
Eosinophils Relative: 0 %
Eosinophils Relative: 0 %
HCT: 37.7 % — ABNORMAL LOW (ref 39.0–52.0)
HCT: 37 % — ABNORMAL LOW (ref 39.0–52.0)
Hemoglobin: 11.1 g/dL — ABNORMAL LOW (ref 13.0–17.0)
Hemoglobin: 10.9 g/dL — ABNORMAL LOW (ref 13.0–17.0)
Immature Granulocytes: 1 %
Immature Granulocytes: 1 %
Lymphocytes Relative: 16 %
Lymphocytes Relative: 20 %
Lymphs Abs: 0.1 10*3/uL — ABNORMAL LOW (ref 0.7–4.0)
Lymphs Abs: 0.2 10*3/uL — ABNORMAL LOW (ref 0.7–4.0)
MCH: 32.6 pg (ref 26.0–34.0)
MCH: 32.5 pg (ref 26.0–34.0)
MCHC: 29.4 g/dL — ABNORMAL LOW (ref 30.0–36.0)
MCHC: 29.5 g/dL — ABNORMAL LOW (ref 30.0–36.0)
MCV: 110.9 fL — ABNORMAL HIGH (ref 80.0–100.0)
MCV: 110.4 fL — ABNORMAL HIGH (ref 80.0–100.0)
Monocytes Absolute: 0.2 10*3/uL (ref 0.1–1.0)
Monocytes Absolute: 0.2 10*3/uL (ref 0.1–1.0)
Monocytes Relative: 22 %
Monocytes Relative: 26 %
Neutro Abs: 0.5 10*3/uL — ABNORMAL LOW (ref 1.7–7.7)
Neutro Abs: 0.5 10*3/uL — ABNORMAL LOW (ref 1.7–7.7)
Neutrophils Relative %: 60 %
Neutrophils Relative %: 51 %
Platelets: 327 10*3/uL (ref 150–400)
Platelets: 288 10*3/uL (ref 150–400)
RBC: 3.4 MIL/uL — ABNORMAL LOW (ref 4.22–5.81)
RBC: 3.35 MIL/uL — ABNORMAL LOW (ref 4.22–5.81)
RDW: 19.8 % — ABNORMAL HIGH (ref 11.5–15.5)
RDW: 19.1 % — ABNORMAL HIGH (ref 11.5–15.5)
WBC: 0.8 10*3/uL — CL (ref 4.0–10.5)
WBC: 0.9 10*3/uL — CL (ref 4.0–10.5)
nRBC: 0 % (ref 0.0–0.2)
nRBC: 0 % (ref 0.0–0.2)

## 2020-07-25 LAB — COMPREHENSIVE METABOLIC PANEL
ALT: 5 U/L (ref 0–44)
AST: 32 U/L (ref 15–41)
Albumin: 2.3 g/dL — ABNORMAL LOW (ref 3.5–5.0)
Alkaline Phosphatase: 40 U/L (ref 38–126)
Anion gap: 9 (ref 5–15)
BUN: 47 mg/dL — ABNORMAL HIGH (ref 6–20)
CO2: 28 mmol/L (ref 22–32)
Calcium: 8.2 mg/dL — ABNORMAL LOW (ref 8.9–10.3)
Chloride: 112 mmol/L — ABNORMAL HIGH (ref 98–111)
Creatinine, Ser: 0.79 mg/dL (ref 0.61–1.24)
GFR, Estimated: >60 mL/min (ref >60)
Glucose, Bld: 128 mg/dL — ABNORMAL HIGH (ref 70–99)
Potassium: 3.6 mmol/L (ref 3.5–5.1)
Sodium: 149 mmol/L — ABNORMAL HIGH (ref 135–145)
Total Bilirubin: 0.2 mg/dL — ABNORMAL LOW (ref 0.3–1.2)
Total Protein: 5.7 g/dL — ABNORMAL LOW (ref 6.5–8.1)

## 2020-07-25 LAB — BASIC METABOLIC PANEL
Anion gap: 6 (ref 5–15)
BUN: 35 mg/dL — ABNORMAL HIGH (ref 6–20)
CO2: 30 mmol/L (ref 22–32)
Calcium: 8.1 mg/dL — ABNORMAL LOW (ref 8.9–10.3)
Chloride: 112 mmol/L — ABNORMAL HIGH (ref 98–111)
Creatinine, Ser: 0.67 mg/dL (ref 0.61–1.24)
GFR, Estimated: >60 mL/min (ref >60)
Glucose, Bld: 107 mg/dL — ABNORMAL HIGH (ref 70–99)
Potassium: 3.5 mmol/L (ref 3.5–5.1)
Sodium: 148 mmol/L — ABNORMAL HIGH (ref 135–145)

## 2020-07-25 LAB — APTT: aPTT: 34 seconds (ref 24–36)

## 2020-07-25 LAB — LACTIC ACID, PLASMA
Lactic Acid, Venous: 2.9 mmol/L (ref 0.5–1.9)
Lactic Acid, Venous: 2.7 mmol/L (ref 0.5–1.9)

## 2020-07-25 MED ORDER — LACTATED RINGERS IV BOLUS
500.0000 mL | Freq: Once | INTRAVENOUS | Status: AC
  Administered 2020-07-25: 500 mL via INTRAVENOUS

## 2020-07-25 MED ORDER — ACETAMINOPHEN 325 MG PO TABS: 650.0000 mg | ORAL_TABLET | Freq: Four times a day (QID) | ORAL | Status: DC | PRN

## 2020-07-25 MED ORDER — ACETAMINOPHEN 650 MG RE SUPP: 650.0000 mg | Freq: Four times a day (QID) | RECTAL | Status: DC | PRN

## 2020-07-25 MED ORDER — METRONIDAZOLE 500 MG PO TABS
500.0000 mg | ORAL_TABLET | Freq: Three times a day (TID) | ORAL | Status: DC
  Filled 2020-07-25: qty 1

## 2020-07-25 MED ORDER — VANCOMYCIN HCL IN DEXTROSE 1-5 GM/200ML-% IV SOLN
1000.0000 mg | Freq: Once | INTRAVENOUS | Status: AC
  Administered 2020-07-25: 1000 mg via INTRAVENOUS
  Filled 2020-07-25: qty 200

## 2020-07-25 MED ORDER — SODIUM CHLORIDE 0.9 % IV SOLN: 2.0000 g | Freq: Once | INTRAVENOUS | Status: DC

## 2020-07-25 MED ORDER — IPRATROPIUM-ALBUTEROL 20-100 MCG/ACT IN AERS
2.0000 | INHALATION_SPRAY | Freq: Four times a day (QID) | RESPIRATORY_TRACT | Status: DC | PRN
  Filled 2020-07-25: qty 4

## 2020-07-25 MED ORDER — SODIUM CHLORIDE 0.9 % IV SOLN
2.0000 g | Freq: Once | INTRAVENOUS | Status: AC
  Administered 2020-07-25: 2 g via INTRAVENOUS
  Filled 2020-07-25: qty 2

## 2020-07-25 MED ORDER — LACTATED RINGERS IV SOLN: INTRAVENOUS | Status: DC

## 2020-07-25 MED ORDER — LACTATED RINGERS IV BOLUS (SEPSIS)
2000.0000 mL | Freq: Once | INTRAVENOUS | Status: AC
  Administered 2020-07-25: 2000 mL via INTRAVENOUS

## 2020-07-25 MED ORDER — ACETAMINOPHEN 650 MG RE SUPP
650.0000 mg | Freq: Four times a day (QID) | RECTAL | Status: DC | PRN
  Administered 2020-07-25: 650 mg via RECTAL
  Filled 2020-07-25: qty 1

## 2020-07-25 MED ORDER — SODIUM CHLORIDE 0.9 % IV SOLN
2.0000 g | Freq: Three times a day (TID) | INTRAVENOUS | Status: DC
  Administered 2020-07-25 – 2020-07-26 (×2): 2 g via INTRAVENOUS
  Filled 2020-07-25 (×3): qty 2

## 2020-07-25 MED ORDER — VANCOMYCIN HCL IN DEXTROSE 1-5 GM/200ML-% IV SOLN
1000.0000 mg | INTRAVENOUS | Status: DC
  Administered 2020-07-26: 1000 mg via INTRAVENOUS
  Filled 2020-07-25: qty 200

## 2020-07-25 NOTE — Progress Notes (Signed)
Pharmacy Antibiotic Note  Tracy Todd is a 54 y.o. male admitted on 08/06/2020 with sepsis, multifocal pneumonia. Patient currently being treated for sacrococcygeal osteomyelitis for 6 weeks through 08/06/2020. Pharmacy has been consulted for Vancomycin and Cefepime dosing.  Plan: Vancomycin 1g IV q24h Vancomycin trough level at steady state, as indicated Cefepime 2g IV q8h Metronidazole 500mg  PO q8h through 08/06/2020 Monitor renal function, cultures, clinical course     Temp (24hrs), Avg:102.5 F (39.2 C), Min:102.5 F (39.2 C), Max:102.5 F (39.2 C)  Recent Labs  Lab 07/19/20 0415 07/23/2020 1000  WBC 4.5 0.8*  CREATININE 0.54* 0.79  LATICACIDVEN  --  2.9*    Estimated Creatinine Clearance: 60.2 mL/min (by C-G formula based on SCr of 0.79 mg/dL).    Allergies  Allergen Reactions  . Penicillins     Unknown Did it involve swelling of the face/tongue/throat, SOB, or low BP? Unk Did it involve sudden or severe rash/hives, skin peeling, or any reaction on the inside of your mouth or nose? Unk Did you need to seek medical attention at a hospital or doctor's office? Unk When did it last happen?childhood If all above answers are "NO", may proceed with cephalosporin use.      Antimicrobials this admission: 12/4 Vancomycin >> 12/4 Cefepime >> PTA Metronidazole resumed >> (12/16)  Dose adjustments this admission: --  Microbiology results: 12/4 BCx: pending 12/4 UCx:   12/4 MRSA PCR:  Thank you for allowing pharmacy to be a part of this patient's care.   14/4, PharmD, BCPS Clinical Pharmacist  08/20/2020 1:23 PM

## 2020-07-25 NOTE — Progress Notes (Signed)
Civil engineer, contracting [facility])  Referral received for hospice services for [name].   Hospice would not be able to get services started until Sunday.  ACC will follow up with hospital and family tomorrow to see how we can support him d/c back to [facility] with hospice support.  Wallis Bamberg RN, BSN, CCRN [facility] Liaison

## 2020-07-25 NOTE — ED Triage Notes (Signed)
EMS reports from [facility]. Pt has been on IV ABX x 5 days for PNA. Staff called out for SOB, fever.   102/66 110 RR 40 SpO2 84 RA Non rebreather @ 15ltrs SpO2 96 Temp 102.4  IV L upper arm NS enroute  650 Tyleno; supp.

## 2020-07-25 NOTE — H&P (Signed)
History and Physical    Tracy Todd XBJ:[id number] DOB: 03-10-1966 DOA: 08/14/2020  PCP: Chesley Noon, MD  Patient coming from: [facility] Chief Complaint: SOB, fever, aspiration  HPI: Tracy Todd is a 54 y.o. male with a pertinent history of   Tracy Todd  is a 54 y.o. male,  history of seizures, recurrent syncope, hyperlipidemia, iron deficiency anemia, Down syndrome, seizure disorder on Keppra who presents to the ED with shortness of breath, fever, progressive lethargy at home.  At baseline he is fairly alert but nonverbal but the family can understand him.    Patient was recently hospitalized for acute hypoxic respiratory failure d/t RLL pneumoni "Severe sepsis due to right lower lobe aspiration pneumonia and chronic sacral and left trochanteric osteomyelitis, POA" and found to be **incidentally COVID positive on 11/15 and received monoclonal antibodies on 11/15 and was thought to be asymptomatic**and was discharged with oral metronidazole and Ancef for 6 weeks with end date of 08/05/2020.  Previous to this hospitalization, he was noted to be bedbound for 7 to 8 months and was probably on cefdinir and Flagyl patient is mostly nonverbal but the sister states that staff is stating he is not eating well and we agreed that Peg tube would not prevent aspiration and would like to pursue hospice.   Sister was at bedside for the ED provider and she is the primary caretaker as well as the power of attorney. Patient lives at [place]  In the emergency department he had softer blood pressures of 30Q to 90 systolic, -NA 657, Q4.6, CL 112, SCR 0.79, BUN 47, glucose 128, albumin 2.3, calcium 8.2 -WBC 0.8, Hgb 11.1, MCV 110.9 B12 and folate were said to be okay in the past EKG showed tachycardia to 103 with nonspecific ST changes Chest x-ray showed bilateral hazy opacities consistent with multifocal pneumonia  Review of Systems: As per HPI otherwise 10 point review of systems negative.  Other  pertinents as below: not able to perform Ros because of mental status and patient is mostly nonverbal General -  HEENT -  Cardio -  Resp -  GI -  GU -  MSK -  Skin -  Neuro -  Psych -    Past Medical History:  Diagnosis Date  . Arthritis   . Down's syndrome   . Headache   . Hyperlipidemia   . Iron deficiency anemia   . Recurrent syncope   . Seizure disorder ([place]) 05/21/2020  . Seizures ([place])     History reviewed. No pertinent surgical history.   reports that he has never smoked. He has never used smokeless tobacco. He reports that he does not drink alcohol and does not use drugs.  Allergies  Allergen Reactions  . Penicillins     Unknown Did it involve swelling of the face/tongue/throat, SOB, or low BP? Unk Did it involve sudden or severe rash/hives, skin peeling, or any reaction on the inside of your mouth or nose? Unk Did you need to seek medical attention at a hospital or doctor's office? Unk When did it last happen?childhood If all above answers are "NO", may proceed with cephalosporin use.      History reviewed. No pertinent family history.   Prior to Admission medications   Medication Sig Start Date End Date Taking? Authorizing Provider  acetaminophen (TYLENOL) 500 MG tablet Take 500 mg by mouth every 6 (six) hours as needed for moderate pain.  04/14/20 04/14/21 Yes [provider]  Amino Acids-Protein Hydrolys (FEEDING  SUPPLEMENT, PRO-STAT SUGAR FREE 64,) LIQD Take 30 mLs by mouth 2 (two) times daily. To aid in wound healing; resident prefers Prostat mixed in water. 02/14/20  Yes [provider]  aspirin 81 MG EC tablet Take 1 tablet (81 mg total) by mouth daily. 05/21/20  Yes Kathrynn Ducking, MD  benzocaine (HURRICAINE) 20 % oral spray Use as directed 1 application in the mouth or throat 3 (three) times daily as needed for throat irritation / pain.   Yes [provider]  bisacodyl (DULCOLAX) 10 MG suppository Place 10 mg rectally  daily as needed for moderate constipation (if not relieved by milk of mag).   Yes [provider]  ceFAZolin (ANCEF) IVPB Inject 2 g into the vein every 8 (eight) hours for 21 days. Indication:  Sacrococcygeal osteomyelitis First Dose: Yes Last Day of Therapy:  08/06/2020 Labs - Once weekly:  CBC/D and BMP, Labs - Every other week:  ESR and CRP Method of administration: IV Push Method of administration may be changed at the discretion of home infusion pharmacist based upon assessment of the patient and/or caregiver's ability to self-administer the medication ordered. 07/20/20 08/10/20 Yes Gherghe, Vella Redhead, MD  cetirizine (ZYRTEC) 10 MG tablet Take 10 mg by mouth daily as needed for allergies.    Yes [provider]  diclofenac Sodium (VOLTAREN) 1 % GEL Apply 2 g topically 4 (four) times daily. To both knees 03/03/20  Yes Ngetich, Dinah C, NP  Ensure Plus (ENSURE PLUS) LIQD Take 237 mLs by mouth 3 (three) times daily between meals.    Yes [provider]  finasteride (PROSCAR) 5 MG tablet Take 5 mg by mouth daily. 04/30/20  Yes [provider]  hydrocortisone 2.5 % cream Apply 1 application topically 2 (two) times daily as needed (skin rash).  04/14/20 04/14/21 Yes [provider]  iron polysaccharides (NU-IRON) 150 MG capsule Take 1 capsule (150 mg total) by mouth daily. 03/03/20  Yes Ngetich, Dinah C, NP  levETIRAcetam (KEPPRA) 500 MG tablet Take 1 tablet (500 mg total) by mouth 2 (two) times daily. 03/03/20  Yes Ngetich, Dinah C, NP  magnesium hydroxide (MILK OF MAGNESIA) 400 MG/5ML suspension Take 30 mLs by mouth daily as needed for mild constipation (if no bm in 3 days).   Yes [provider]  metoprolol tartrate (LOPRESSOR) 25 MG tablet Take 0.5 tablets (12.5 mg total) by mouth 2 (two) times daily. 07/20/20  Yes Gherghe, Vella Redhead, MD  metroNIDAZOLE (FLAGYL) 500 MG tablet Take 1 tablet (500 mg total) by mouth every 8 (eight) hours for 67 doses.  Last date of therapy: 08/06/2020 07/20/20 08/12/20 Yes Caren Griffins, MD  Multiple Vitamins-Minerals (MULTIVITAMIN WITH MINERALS) tablet Take 1 tablet by mouth daily.   Yes [provider]  nutrition supplement, JUVEN, (JUVEN) PACK Take 1 packet by mouth 2 (two) times daily between meals. To aid in wound healing. 02/14/20  Yes [provider]  PRESCRIPTION MEDICATION Take 1 Bottle by mouth with breakfast, with lunch, and with evening meal. Magic cup   Yes [provider]  saccharomyces boulardii (FLORASTOR) 250 MG capsule Take 1 capsule (250 mg total) by mouth 2 (two) times daily. 07/06/20 09/04/20 Yes Kayleen Memos, DO  Sodium Phosphates (RA SALINE ENEMA RE) Place 1 application rectally daily as needed (constipation). If not relieved by Biscodyl suppository, give disposable Saline Enema rectally X 1 dose/24 hrs as needed (Do not use constipation standing orders for residents with renal failure/CFR less than  76. Contact MD for orders)(Physician Or 02/13/20  Yes [provider]  vitamin B-12 (CYANOCOBALAMIN) 500 MCG tablet Take 1 tablet (500 mcg total) by mouth daily. 03/03/20  Yes Ngetich, Dinah C, NP  zinc gluconate 50 MG tablet Take 1 tablet (50 mg total) by mouth daily. 03/03/20  Yes Ngetich, Dinah C, NP  Disposable Gloves MISC Use gloves for diaper changes.Dispense 2 boxes 03/19/20   [provider]  Incontinence Supply Disposable (COMFORT SHIELD ADULT DIAPERS) MISC Dispense 5 boxes/mo. Use 4-6 times daily for fecal incontinence and diarrhea. 03/19/20   [provider]  Incontinence Supply Disposable (RA ADULT WIPES) MISC Please dispense 4 boxes/wipes per month. 03/19/20   [provider]  Incontinence Supply Disposable (UNDERPADS MEDIUM) MISC Dispense 3 boxes/mo for fecal and urinary incontinence and diarrhea. 03/19/20   [provider]  ketoconazole (NIZORAL) 2 % shampoo Apply 1 application topically See admin instructions. Use to  wash hair and face, alternate by week. Patient not taking: Reported on 08/19/2020 03/03/20   Ngetich, Nelda Bucks, NP  Misc. Devices MISC Manual wheelchair. 16 inch wide with elevating leg rests, high reclining back and neck support and 16-inch cushion. 03/18/20   [provider]  NON FORMULARY Regular liberalized diet d/t Dx Malnutrition.  02/14/20   [provider]    Physical Exam: Vitals:   08/17/2020 1630 07/31/2020 1730 07/23/2020 1800 08/10/2020 1830  BP: 97/66 100/63 90/71 91/68   Pulse: (!) 106 (!) 112 (!) 112 (!) 111  Resp: (!) 37 (!) 30 (!) 32 (!) 37  Temp:      TempSrc:      SpO2: 95% 95% 96% 95%    Constitutional: cachectic, thin, very weak appearing, localizes his eyes to me, not really able to talk, very dry mouth, toxic appearing Eyes: pupils equal and reactive to light, anicteric, without injection ENMT: MMM, throat without exudates or erythema Neck: normal, supple, no masses, no thyromegaly noted Respiratory: limited breath sounds on R, tachypneic. Cardiovascular: rrr w/o mrg, warm extremities Abdomen: a little tender in the abdomen, he would grimace, but somewhat soft Musculoskeletal: not moving LE's Skin: no rashes, lesions, ulcers. No induration, stage 4 ulcer on left hip and sacral area, sister states they were actually improving. Neurologic: CN 2-12 grossly intact  Psychiatric: AO appearing, mentation appropriate  Labs on Admission: I have personally reviewed following labs and imaging studies  CBC: Recent Labs  Lab 07/19/20 0415 08/20/2020 1000 08/18/2020 1400  WBC 4.5 0.8* 0.9*  NEUTROABS  --  0.5* 0.5*  HGB 9.9* 11.1* 10.9*  HCT 33.3* 37.7* 37.0*  MCV 105.0* 110.9* 110.4*  PLT 295 327 583   Basic Metabolic Panel: Recent Labs  Lab 07/19/20 0415 08/06/2020 1000 07/23/2020 1400  NA 141 149* 148*  K 3.8 3.6 3.5  CL 104 112* 112*  CO2 30 28 30   GLUCOSE 90 128* 107*  BUN 13 47* 35*  CREATININE 0.54* 0.79 0.67  CALCIUM 8.1* 8.2* 8.1*    GFR: Estimated Creatinine Clearance: 60.2 mL/min (by C-G formula based on SCr of 0.67 mg/dL). Liver Function Tests: Recent Labs  Lab 07/29/2020 1000  AST 32  ALT 5  ALKPHOS 40  BILITOT 0.2*  PROT 5.7*  ALBUMIN 2.3*   No results for input(s): LIPASE, AMYLASE in the last 168 hours. No results for input(s): AMMONIA in the last 168 hours. Coagulation Profile: Recent Labs  Lab 08/21/2020 1000  INR 1.7*   Cardiac Enzymes: No results for input(s): CKTOTAL, CKMB, CKMBINDEX, TROPONINI in  the last 168 hours. BNP (last 3 results) No results for input(s): PROBNP in the last 8760 hours. HbA1C: No results for input(s): HGBA1C in the last 72 hours. CBG: No results for input(s): GLUCAP in the last 168 hours. Lipid Profile: No results for input(s): CHOL, HDL, LDLCALC, TRIG, CHOLHDL, LDLDIRECT in the last 72 hours. Thyroid Function Tests: No results for input(s): TSH, T4TOTAL, FREET4, T3FREE, THYROIDAB in the last 72 hours. Anemia Panel: No results for input(s): VITAMINB12, FOLATE, FERRITIN, TIBC, IRON, RETICCTPCT in the last 72 hours. Urine analysis:    Component Value Date/Time   COLORURINE YELLOW 07/18/2020 2300   APPEARANCEUR CLEAR 07/18/2020 2300   LABSPEC 1.015 07/18/2020 2300   PHURINE 5.0 07/18/2020 2300   GLUCOSEU NEGATIVE 07/18/2020 2300   HGBUR NEGATIVE 07/18/2020 2300   BILIRUBINUR NEGATIVE 07/18/2020 2300   KETONESUR 5 (A) 07/18/2020 2300   PROTEINUR NEGATIVE 07/18/2020 2300   NITRITE NEGATIVE 07/18/2020 2300   LEUKOCYTESUR LARGE (A) 07/18/2020 2300    Radiological Exams on Admission: DG Chest Port 1 View  Result Date: 08/14/2020 CLINICAL DATA:  Admitted for multifocal pneumonia/sepsis. Assess for possible mucous plugging. EXAM: PORTABLE CHEST 1 VIEW COMPARISON:  08/19/2020 at 10:41 a.m. FINDINGS: Bilateral perihilar and lower lung zone opacities are without change from the earlier exam. There may be volume loss on the right, this assessment somewhat limited by  patient rotation to the right. No convincing pleural effusion and no pneumothorax. Left PICC is stable. IMPRESSION: 1. No change from the earlier study. 2. Perihilar and lower lung zone opacities consistent with multifocal pneumonia. Possible volume loss on the right, which suggests a component of atelectasis. Electronically Signed   By: Lajean Manes M.D.   On: 08/07/2020 14:04   DG Chest Port 1 View  Result Date: 08/05/2020 CLINICAL DATA:  [facility] B[facility]. Pt has been on IV ABX x 5 days for PNA. Staff called out for SOB and fever. EXAM: PORTABLE CHEST 1 VIEW COMPARISON:  07/06/2020 FINDINGS: Hazy perihilar and lower lung zone airspace opacities are noted, findings on the left similar to the previous chest radiograph, findings on the right are new. No evidence of pulmonary edema. No convincing pleural effusion and no pneumothorax. Cardiac silhouette is normal in size. No mediastinal or hilar masses. Left PICC is stable, tip in the lower superior vena cava. IMPRESSION: 1. Bilateral hazy perihilar and lower lung airspace opacities consistent with multifocal pneumonia. Electronically Signed   By: Lajean Manes M.D.   On: 07/24/2020 10:51    EKG: Independently reviewed. Tachycardic without significant ischemic changes  Assessment/Plan Active Problems:   Down syndrome   Pressure injury of skin   Seizure disorder (HCC)   Sacral osteomyelitis (HCC)   Pressure injury of buttock, stage 4 (HCC)   Personal history of COVID-19   Subclinical hypothyroidism   Severe sepsis (HCC)   Acute respiratory distress syndrome (ARDS) due to COVID-19 virus (HCC)  Hypoxic Respitaroy failure, new, POA, thought to be from aspiration pneumonia as it is said he really can't tolerate eating orals any more.  Quesiton a component of the previous covid even though he had monoclonal antibodies and was mildly positive on 11/15.  Will treat for HAP for now --pulmonary toilet, aggressive, appreciate  pulmonology's consult, follow up recommendations --oxygen, RT consult --goals of care as below, Lattie Haw, the HCPOA is willing to pursue hospice.  Hopefully hospice talks to them tomorrow.  Severe sepsis with lactic acid elevation of 2.9 -Neutropenia of  500 which seems like it is new and question dilution, repeat CBC -trend lactic acids -isotonic fluids for hypernatremia --repeat lactic acid was similar so bolused again 57m --escalated to cefepimine, vancomycin and oral flagyl from home ancef and flagyl for wounds --patient seemed like he was sattin gbetter on initial evaluation then he started desattin gagain, he has limited breath sounds on the R side and with secretions despite not wantin gintubation so I consulted pulmonary for their help.  I tried to consult hospice but they wouldn't be able to get things set up until Sunday per note. --Wanted to stabilize patient and perhaps do hospice in place and probably set this up tomorrow. --consider deescalation tomorrow to previous OM regimen.  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  Dysphagia -Continue dysphagia 1 diet or as goals of care describes  Subclinical hypothyroidism -TSH 7.7, repeat TSH in 3 weeks   Severe protein calorie malnutrition -Supplement protein as able.   Hypernatremia  -ctm -Encourage free water by mouth.   Patient and/or Family completely agreed with the plan, expressed understanding and I answered all questions.  DVT prophylaxis: Lovenox SQ Code Status: DNR Family Communication: LElaina Pattee  Disposition Plan: hospice, but they apparently can't be ready until Sunday, the plan [facility], but maybe has to do hospice in place? Consults called: pulmonology  Dr. QKizzie IdeAdmission status:  Inpatient to stepdown unit because of his tenuous status to focus on suctioining  A total of 90 minutes utilized during this admission.  [facility]  If 7PM-7AM, please contact night-coverage [email or web address] Password [email or web address]  08/19/2020, 8:50 PM

## 2020-07-25 NOTE — Progress Notes (Signed)
Pharmacy Note   A consult was received from an ED physician for vancomycin aztreonam  per pharmacy dosing.   Per consult, " Pharmacist to investigate beta-lactam allergy. If history of intolerance, mild allergy, or documented history of use of cephalosporins, pharmacy can adjust aztreonam to cefepime."   - Pt on cefazolin PTA. Therefore will change aztreonam to cefepime    The patient's profile has been reviewed for ht/wt/allergies/indication/available labs.    A one time order has been placed for vancomycin 1000 mg IV x1 and cefepime 2 gr IV x1 .    Further antibiotics/pharmacy consults should be ordered by admitting physician if indicated.                       Thank you,  Adalberto Cole, PharmD, BCPS 2020/08/18 10:15 AM

## 2020-07-25 NOTE — ED Provider Notes (Signed)
[facility] DEPT Provider Note   CSN: [id number] Arrival date & time: 08/16/2020  0946     History Chief Complaint  Patient presents with  . Shortness of Breath  . Fever    Tracy Todd is a 54 y.o. male.  Patient brought in for shortness of breath confusion and fever.  Patient has recently left the hospital with sepsis from pneumonia and osteomyelitis.  The history is provided by a relative. No language interpreter was used.  Shortness of Breath Severity:  Moderate Onset quality:  Sudden Timing:  Constant Progression:  Worsening Chronicity:  Recurrent Context: not activity   Relieved by:  Nothing Worsened by:  Nothing Ineffective treatments:  None tried Associated symptoms: fever   Associated symptoms: no abdominal pain, no chest pain, no cough, no headaches and no rash   Fever Associated symptoms: no chest pain, no congestion, no cough, no diarrhea, no headaches and no rash        Past Medical History:  Diagnosis Date  . Arthritis   . Down's syndrome   . Headache   . Hyperlipidemia   . Iron deficiency anemia   . Recurrent syncope   . Seizure disorder ([place]) 05/21/2020  . Seizures [facility])     Patient Active Problem List   Diagnosis Date Noted  . Severe sepsis ([place]) 08/06/2020  . Normocytic anemia 07/21/2020  . Primary osteoarthritis of both knees 07/21/2020  . Pure hypertriglyceridemia 07/21/2020  . 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 Lobar pneumonia ([place]) 07/21/2020  . Personal history of COVID-19 07/21/2020  . Subclinical hypothyroidism 07/21/2020  . Sepsis ([place]) 06/22/2020  . Seizure disorder ([place]) 05/21/2020  . Pressure injury of skin 02/06/2020  . Wound infection 02/05/2020  . Iron deficiency anemia 09/17/2019  . Elevated troponin 09/17/2019  . Syncope 09/16/2019  . Down syndrome 12/27/2012  . Arthritis 10/25/2011  . Hyperlipidemia 10/25/2011    History  reviewed. No pertinent surgical history.     History reviewed. No pertinent family history.  Social History   Tobacco Use  . Smoking status: Never Smoker  . Smokeless tobacco: Never Used  Vaping Use  . Vaping Use: Never used  Substance Use Topics  . Alcohol use: Never  . Drug use: Never    Home Medications Prior to Admission medications   Medication Sig Start Date End Date Taking? Authorizing Provider  acetaminophen (TYLENOL) 500 MG tablet Take 500 mg by mouth every 6 (six) hours as needed for moderate pain.  04/14/20 04/14/21 Yes [provider]  Amino Acids-Protein Hydrolys (FEEDING SUPPLEMENT, PRO-STAT SUGAR FREE 64,) LIQD Take 30 mLs by mouth 2 (two) times daily. To aid in wound healing; resident prefers Prostat mixed in water. 02/14/20  Yes [provider]  aspirin 81 MG EC tablet Take 1 tablet (81 mg total) by mouth daily. 05/21/20  Yes Kathrynn Ducking, MD  benzocaine (HURRICAINE) 20 % oral spray Use as directed 1 application in the mouth or throat 3 (three) times daily as needed for throat irritation / pain.   Yes [provider]  bisacodyl (DULCOLAX) 10 MG suppository Place 10 mg rectally daily as needed for moderate constipation (if not relieved by milk of mag).   Yes [provider]  ceFAZolin (ANCEF) IVPB Inject 2 g into the vein every 8 (eight) hours for 21 days. Indication:  Sacrococcygeal osteomyelitis First Dose: Yes Last Day of Therapy:  08/06/2020 Labs - Once weekly:  CBC/D and BMP, Labs - Every other week:  ESR and CRP Method of administration: IV Push Method of administration may be changed at the discretion of home infusion pharmacist based upon assessment of the patient and/or caregiver's ability to self-administer the medication ordered. 07/20/20 08/10/20 Yes Gherghe, Vella Redhead, MD  cetirizine (ZYRTEC) 10 MG tablet Take 10 mg by mouth daily as needed for allergies.    Yes [provider]  diclofenac Sodium (VOLTAREN) 1  % GEL Apply 2 g topically 4 (four) times daily. To both knees 03/03/20  Yes Ngetich, Dinah C, NP  Ensure Plus (ENSURE PLUS) LIQD Take 237 mLs by mouth 3 (three) times daily between meals.    Yes [provider]  finasteride (PROSCAR) 5 MG tablet Take 5 mg by mouth daily. 04/30/20  Yes [provider]  hydrocortisone 2.5 % cream Apply 1 application topically 2 (two) times daily as needed (skin rash).  04/14/20 04/14/21 Yes [provider]  iron polysaccharides (NU-IRON) 150 MG capsule Take 1 capsule (150 mg total) by mouth daily. 03/03/20  Yes Ngetich, Dinah C, NP  levETIRAcetam (KEPPRA) 500 MG tablet Take 1 tablet (500 mg total) by mouth 2 (two) times daily. 03/03/20  Yes Ngetich, Dinah C, NP  magnesium hydroxide (MILK OF MAGNESIA) 400 MG/5ML suspension Take 30 mLs by mouth daily as needed for mild constipation (if no bm in 3 days).   Yes [provider]  metoprolol tartrate (LOPRESSOR) 25 MG tablet Take 0.5 tablets (12.5 mg total) by mouth 2 (two) times daily. 07/20/20  Yes Gherghe, Vella Redhead, MD  metroNIDAZOLE (FLAGYL) 500 MG tablet Take 1 tablet (500 mg total) by mouth every 8 (eight) hours for 67 doses. Last date of therapy: 08/06/2020 07/20/20 08/12/20 Yes Caren Griffins, MD  Multiple Vitamins-Minerals (MULTIVITAMIN WITH MINERALS) tablet Take 1 tablet by mouth daily.   Yes [provider]  nutrition supplement, JUVEN, (JUVEN) PACK Take 1 packet by mouth 2 (two) times daily between meals. To aid in wound healing. 02/14/20  Yes [provider]  PRESCRIPTION MEDICATION Take 1 Bottle by mouth with breakfast, with lunch, and with evening meal. Magic cup   Yes [provider]  saccharomyces boulardii (FLORASTOR) 250 MG capsule Take 1 capsule (250 mg total) by mouth 2 (two) times daily. 07/06/20 09/04/20 Yes Kayleen Memos, DO  Sodium Phosphates (RA SALINE ENEMA RE) Place 1 application rectally daily as needed (constipation). If not relieved by  Biscodyl suppository, give disposable Saline Enema rectally X 1 dose/24 hrs as needed (Do not use constipation standing orders for residents with renal failure/CFR less than 30. Contact MD for orders)(Physician Or 02/13/20  Yes [provider]  vitamin B-12 (CYANOCOBALAMIN) 500 MCG tablet Take 1 tablet (500 mcg total) by mouth daily. 03/03/20  Yes Ngetich, Dinah C, NP  zinc gluconate 50 MG tablet Take 1 tablet (50 mg total) by mouth daily. 03/03/20  Yes Ngetich, Dinah C, NP  Disposable Gloves MISC Use gloves for diaper changes.Dispense 2 boxes 03/19/20   [provider]  Incontinence Supply Disposable (COMFORT SHIELD ADULT DIAPERS) MISC Dispense 5 boxes/mo. Use 4-6 times daily for fecal incontinence and diarrhea. 03/19/20   [provider]  Incontinence Supply Disposable (RA ADULT WIPES) MISC Please dispense 4 boxes/wipes per month. 03/19/20   [provider]  Incontinence Supply Disposable (UNDERPADS MEDIUM) MISC Dispense 3 boxes/mo for fecal and urinary incontinence and diarrhea. 03/19/20   [provider]  ketoconazole (NIZORAL) 2 % shampoo Apply 1  application topically See admin instructions. Use to wash hair and face, alternate by week. Patient not taking: Reported on 08/02/2020 03/03/20   Ngetich, Nelda Bucks, NP  Misc. Devices MISC Manual wheelchair. 16 inch wide with elevating leg rests, high reclining back and neck support and 16-inch cushion. 03/18/20   [provider]  NON FORMULARY Regular liberalized diet d/t Dx Malnutrition.  02/14/20   [provider]    Allergies    Penicillins  Review of Systems   Review of Systems  Constitutional: Positive for fever. Negative for appetite change and fatigue.  HENT: Negative for congestion, ear discharge and sinus pressure.   Eyes: Negative for discharge.  Respiratory: Positive for shortness of breath. Negative for cough.   Cardiovascular: Negative for chest pain.  Gastrointestinal: Negative for  abdominal pain and diarrhea.  Genitourinary: Negative for frequency and hematuria.  Musculoskeletal: Negative for back pain.  Skin: Negative for rash.  Neurological: Negative for seizures and headaches.  Psychiatric/Behavioral: Negative for hallucinations.    Physical Exam Updated Vital Signs BP 105/79   Pulse 92   Temp (!) 102.5 F (39.2 C) (Rectal)   Resp (!) 28   SpO2 100%   Physical Exam Vitals and nursing note reviewed.  Constitutional:      Appearance: He is well-developed.     Comments: Lethargic  HENT:     Head: Normocephalic.     Nose: Nose normal.  Eyes:     General: No scleral icterus.    Conjunctiva/sclera: Conjunctivae normal.  Neck:     Thyroid: No thyromegaly.  Cardiovascular:     Rate and Rhythm: Tachycardia present.     Heart sounds: No murmur heard.  No friction rub. No gallop.   Pulmonary:     Breath sounds: No stridor. Rales present. No wheezing.     Comments: Tachypnea Chest:     Chest wall: No tenderness.  Abdominal:     General: There is no distension.     Tenderness: There is no abdominal tenderness. There is no rebound.  Musculoskeletal:        General: Normal range of motion.     Cervical back: Neck supple.  Lymphadenopathy:     Cervical: No cervical adenopathy.  Skin:    Findings: No erythema or rash.  Neurological:     Motor: No abnormal muscle tone.     Coordination: Coordination normal.     Comments: Patient is awake but confused     ED Results / Procedures / Treatments   Labs (all labs ordered are listed, but only abnormal results are displayed) Labs Reviewed  LACTIC ACID, PLASMA - Abnormal; Notable for the following components:      Result Value   Lactic Acid, Venous 2.9 (*)    All other components within normal limits  COMPREHENSIVE METABOLIC PANEL - Abnormal; Notable for the following components:   Sodium 149 (*)    Chloride 112 (*)    Glucose, Bld 128 (*)    BUN 47 (*)    Calcium 8.2 (*)    Total Protein 5.7 (*)     Albumin 2.3 (*)    Total Bilirubin 0.2 (*)    All other components within normal limits  CBC WITH DIFFERENTIAL/PLATELET - Abnormal; Notable for the following components:   WBC 0.8 (*)    RBC 3.40 (*)    Hemoglobin 11.1 (*)    HCT 37.7 (*)    MCV 110.9 (*)    MCHC 29.4 (*)  RDW 19.8 (*)    Neutro Abs 0.5 (*)    Lymphs Abs 0.1 (*)    All other components within normal limits  PROTIME-INR - Abnormal; Notable for the following components:   Prothrombin Time 19.6 (*)    INR 1.7 (*)    All other components within normal limits  CULTURE, BLOOD (ROUTINE X 2)  CULTURE, BLOOD (ROUTINE X 2)  URINE CULTURE  APTT  LACTIC ACID, PLASMA  URINALYSIS, ROUTINE W REFLEX MICROSCOPIC  PATHOLOGIST SMEAR REVIEW  BASIC METABOLIC PANEL  CBC WITH DIFFERENTIAL/PLATELET    EKG None  Radiology DG Chest Port 1 View  Result Date: 08/19/2020 CLINICAL DATA:  Pt BIB EMS from [facility]. Pt has been on IV ABX x 5 days for PNA. Staff called out for SOB and fever. EXAM: PORTABLE CHEST 1 VIEW COMPARISON:  07/06/2020 FINDINGS: Hazy perihilar and lower lung zone airspace opacities are noted, findings on the left similar to the previous chest radiograph, findings on the right are new. No evidence of pulmonary edema. No convincing pleural effusion and no pneumothorax. Cardiac silhouette is normal in size. No mediastinal or hilar masses. Left PICC is stable, tip in the lower superior vena cava. IMPRESSION: 1. Bilateral hazy perihilar and lower lung airspace opacities consistent with multifocal pneumonia. Electronically Signed   By: Lajean Manes M.D.   On: 08/01/2020 10:51    Procedures Procedures (including critical care time)  Medications Ordered in ED Medications  lactated ringers infusion ( Intravenous Not Given 07/23/2020 1252)  acetaminophen (TYLENOL) tablet 650 mg (has no administration in time range)  Ipratropium-Albuterol (COMBIVENT) respimat 2 puff (has no administration in time range)    lactated ringers bolus 2,000 mL (0 mLs Intravenous Stopped 07/30/2020 1251)  vancomycin (VANCOCIN) IVPB 1000 mg/200 mL premix (0 mg Intravenous Stopped 08/11/2020 1251)  ceFEPIme (MAXIPIME) 2 g in sodium chloride 0.9 % 100 mL IVPB (0 g Intravenous Stopped 08/10/2020 1056)    ED Course  I have reviewed the triage vital signs and the nursing notes.  Pertinent labs & imaging results that were available during my care of the patient were reviewed by me and considered in my medical decision making (see chart for details). CRITICAL CARE Performed by: Milton Ferguson Total critical care time: 45 minutes Critical care time was exclusive of separately billable procedures and treating other patients. Critical care was necessary to treat or prevent imminent or life-threatening deterioration. Critical care was time spent personally by me on the following activities: development of treatment plan with patient and/or surrogate as well as nursing, discussions with consultants, evaluation of patient's response to treatment, examination of patient, obtaining history from patient or surrogate, ordering and performing treatments and interventions, ordering and review of laboratory studies, ordering and review of radiographic studies, pulse oximetry and re-evaluation of patient's condition.    MDM Rules/Calculators/A&P                         Patient with multifocal pneumonia and sepsis.  He is a DNR.  Internal medicine has evaluated the patient and spoke with the sister and a want hospice care.  He will be admitted for the sepsis and will get hospice involved  Final Clinical Impression(s) / ED Diagnoses Final diagnoses:  Healthcare-associated pneumonia    Rx / DC Orders ED Discharge Orders    None       Milton Ferguson, MD 07/24/2020 1307

## 2020-07-25 NOTE — Progress Notes (Signed)
Elink monitoring for sepsis protocol 

## 2020-07-25 NOTE — Consult Note (Signed)
NAME:  Tracy Todd, MRN:  [id number], DOB:  Feb 18, 1966, LOS: 0 ADMISSION DATE:  07/24/2020, CONSULTATION DATE:  12/4 REFERRING MD:  Francesco Sor, CHIEF COMPLAINT:  Dyspnea     History of present illness   This is a 54 year old male who was admitted to this facility in early November in the setting of a sacral decubitus ulcer infection causing osteomyelitis as well as pneumonia.  He was seen by infectious diseases, treated by the hospitalist service and IV antibiotics were used as a primary form of therapy.  As a part of screening for him to go to a nursing home he was found to be positive for COVID.  This was felt to be an incidental finding so he was treated with a monoclonal antibody at that time.  He was discharged to [facility] and then returned today after 4 days of increasing lethargy, poor p.o. intake, and shortness of breath.  His sister provides the history.  He has Down syndrome at baseline, has lived in a group home for the last 30 years.  He recently transitioned to a private residence home living environment which is where he was exposed to Covid.  The caregiver coming into the home had COVID and unfortunately the gentleman who owns the home where Tracy Todd lived died of Covid 2 weeks ago.  Because of the death in this home the family did not wish for the family to have [employer] come back until early January.  For that reason he went to [facility] for care.  Here in our emergency room he was requiring 6 L of oxygen, he is quite short of breath, he is leukopenic and has a fever.  He has not walked in nearly a year, he has been bedbound for 7 to 8 months.  Past Medical History  Seizure disorder Down syndrome Iron deficiency anemia Hyperlipidemia Osteomyelitis Recurrent aspiration  Significant Hospital Events     Consults:  Pulmonary  Procedures:    Significant Diagnostic Tests:    Micro Data:  11/15 sars cov 2 positive  Antimicrobials:     Interim  history/subjective:    Objective   Blood pressure 111/64, pulse (!) 107, temperature (!) 102.5 F (39.2 C), temperature source Rectal, resp. rate (!) 32, SpO2 93 %.        Intake/Output Summary (Last 24 hours) at 08/16/2020 1619 Last data filed at 08/18/2020 1251 Gross per 24 hour  Intake 2300 ml  Output --  Net 2300 ml   There were no vitals filed for this visit.  Examination: General:  Tachypnea in bed HENT: NCAT OP clear PULM: Crackles bases B, normal effort CV: RRR, no mgr GI: BS+, soft, nontender MSK: normal bulk and tone Neuro: awake, non-verbal   Resolved Hospital Problem list     Assessment & Plan:  Acute hypoxemic respiratory failure due to aspiration pneumonia versus possibly COVID-19.  Given the clinical scenario I think either are possible though it is difficult to know for certain.  Regardless the underlying cause over the last 7 to 8 months Tracy Todd's condition has been declining and it is certain that his time with [place] is short.  His family is interested predominantly in focusing on comfort rather than aggressive care which I think is entirely appropriate. Would first try to place him back at [facility] with hospice care If we are unable to do that this evening then would admit to this facility with full comfort measures with the intention to discharge him to inpatient  hospice as soon as possible He is near day 21 after his initial test, would make efforts to liberalize visitation for him considering his Down syndrome and the fact that he is at end-of-life DNR code status OK to give antibiotics here or at [facility] as written per prior hospital plan for osteomyelitis Would use morphine as needed for relief of dyspnea  Discussed with [facility] and House Coverage  Best practice (evaluated daily)   As above  Labs   CBC: Recent Labs  Lab 07/19/20 0415 07/29/2020 1000 07/30/2020 1400  WBC 4.5 0.8* 0.9*  NEUTROABS  --  0.5* 0.5*  HGB 9.9* 11.1* 10.9*  HCT 33.3* 37.7*  37.0*  MCV 105.0* 110.9* 110.4*  PLT 295 327 124    Basic Metabolic Panel: Recent Labs  Lab 07/19/20 0415 07/28/2020 1000 08/21/2020 1400  NA 141 149* 148*  K 3.8 3.6 3.5  CL 104 112* 112*  CO2 _0 GLUCOSE 90 128* 107*  BUN 13 47* 35*  CREATININE 0.54* 0.79 0.67  CALCIUM 8.1* 8.2* 8.1*   GFR: Estimated Creatinine Clearance: 60.2 mL/min (by C-G formula based on SCr of 0.67 mg/dL). Recent Labs  Lab 07/19/20 0415 08/11/2020 1000 07/23/2020 1400 07/28/2020 1406  WBC 4.5 0.8* 0.9*  --   LATICACIDVEN  --  2.9*  --  2.7*    Liver Function Tests: Recent Labs  Lab 08/07/2020 1000  AST 32  ALT 5  ALKPHOS 40  BILITOT 0.2*  PROT 5.7*  ALBUMIN 2.3*   No results for input(s): LIPASE, AMYLASE in the last 168 hours. No results for input(s): AMMONIA in the last 168 hours.  ABG    Component Value Date/Time   TCO2 31 10/12/2019 1104     Coagulation Profile: Recent Labs  Lab 08/11/2020 1000  INR 1.7*    Cardiac Enzymes: No results for input(s): CKTOTAL, CKMB, CKMBINDEX, TROPONINI in the last 168 hours.  HbA1C: Hgb A1c MFr Bld  Date/Time Value Ref Range Status  12/02/2019 10:06 AM 5.3 4.8 - 5.6 % Final    Comment:    (NOTE)         Prediabetes: 5.7 - 6.4         Diabetes: >6.4         Glycemic control for adults with diabetes: <7.0     CBG: No results for input(s): GLUCAP in the last 168 hours.  Review of Systems:   Cannot obtain  Past Medical History  He,  has a past medical history of Arthritis, Down's syndrome, Headache, Hyperlipidemia, Iron deficiency anemia, Recurrent syncope, Seizure disorder ([place]) (05/21/2020), and Seizures ([place]).   Surgical History   History reviewed. No pertinent surgical history.   Social History   reports that he has never smoked. He has never used smokeless tobacco. He reports that he does not drink alcohol and does not use drugs.   Family History   His family history is not on file.   Allergies Allergies  Allergen Reactions   . Penicillins     Unknown Did it involve swelling of the face/tongue/throat, SOB, or low BP? Unk Did it involve sudden or severe rash/hives, skin peeling, or any reaction on the inside of your mouth or nose? Unk Did you need to seek medical attention at a hospital or doctor's office? Unk When did it last happen?childhood If all above answers are "NO", may proceed with cephalosporin use.       Home Medications  Prior to Admission medications  Medication Sig Start Date End Date Taking? Authorizing Provider  acetaminophen (TYLENOL) 500 MG tablet Take 500 mg by mouth every 6 (six) hours as needed for moderate pain.  04/14/20 04/14/21 Yes [provider]  Amino Acids-Protein Hydrolys (FEEDING SUPPLEMENT, PRO-STAT SUGAR FREE 64,) LIQD Take 30 mLs by mouth 2 (two) times daily. To aid in wound healing; resident prefers Prostat mixed in water. 02/14/20  Yes [provider]  aspirin 81 MG EC tablet Take 1 tablet (81 mg total) by mouth daily. 05/21/20  Yes Kathrynn Ducking, MD  benzocaine (HURRICAINE) 20 % oral spray Use as directed 1 application in the mouth or throat 3 (three) times daily as needed for throat irritation / pain.   Yes [provider]  bisacodyl (DULCOLAX) 10 MG suppository Place 10 mg rectally daily as needed for moderate constipation (if not relieved by milk of mag).   Yes [provider]  ceFAZolin (ANCEF) IVPB Inject 2 g into the vein every 8 (eight) hours for 21 days. Indication:  Sacrococcygeal osteomyelitis First Dose: Yes Last Day of Therapy:  08/06/2020 Labs - Once weekly:  CBC/D and BMP, Labs - Every other week:  ESR and CRP Method of administration: IV Push Method of administration may be changed at the discretion of home infusion pharmacist based upon assessment of the patient and/or caregiver's ability to self-administer the medication ordered. 07/20/20 08/10/20 Yes Gherghe, Vella Redhead, MD  cetirizine (ZYRTEC) 10 MG tablet Take 10  mg by mouth daily as needed for allergies.    Yes [provider]  diclofenac Sodium (VOLTAREN) 1 % GEL Apply 2 g topically 4 (four) times daily. To both knees 03/03/20  Yes Ngetich, Dinah C, NP  Ensure Plus (ENSURE PLUS) LIQD Take 237 mLs by mouth 3 (three) times daily between meals.    Yes [provider]  finasteride (PROSCAR) 5 MG tablet Take 5 mg by mouth daily. 04/30/20  Yes [provider]  hydrocortisone 2.5 % cream Apply 1 application topically 2 (two) times daily as needed (skin rash).  04/14/20 04/14/21 Yes [provider]  iron polysaccharides (NU-IRON) 150 MG capsule Take 1 capsule (150 mg total) by mouth daily. 03/03/20  Yes Ngetich, Dinah C, NP  levETIRAcetam (KEPPRA) 500 MG tablet Take 1 tablet (500 mg total) by mouth 2 (two) times daily. 03/03/20  Yes Ngetich, Dinah C, NP  magnesium hydroxide (MILK OF MAGNESIA) 400 MG/5ML suspension Take 30 mLs by mouth daily as needed for mild constipation (if no bm in 3 days).   Yes [provider]  metoprolol tartrate (LOPRESSOR) 25 MG tablet Take 0.5 tablets (12.5 mg total) by mouth 2 (two) times daily. 07/20/20  Yes Gherghe, Vella Redhead, MD  metroNIDAZOLE (FLAGYL) 500 MG tablet Take 1 tablet (500 mg total) by mouth every 8 (eight) hours for 67 doses. Last date of therapy: 08/06/2020 07/20/20 08/12/20 Yes Caren Griffins, MD  Multiple Vitamins-Minerals (MULTIVITAMIN WITH MINERALS) tablet Take 1 tablet by mouth daily.   Yes [provider]  nutrition supplement, JUVEN, (JUVEN) PACK Take 1 packet by mouth 2 (two) times daily between meals. To aid in wound healing. 02/14/20  Yes [provider]  PRESCRIPTION MEDICATION Take 1 Bottle by mouth with breakfast, with lunch, and with evening meal. Magic cup   Yes [provider]  saccharomyces boulardii (FLORASTOR) 250 MG capsule Take 1 capsule (250 mg total) by mouth 2 (two) times daily. 07/06/20 09/04/20 Yes Hall, Lorenda Cahill, DO  Sodium  Phosphates (RA  SALINE ENEMA RE) Place 1 application rectally daily as needed (constipation). If not relieved by Biscodyl suppository, give disposable Saline Enema rectally X 1 dose/24 hrs as needed (Do not use constipation standing orders for residents with renal failure/CFR less than 30. Contact MD for orders)(Physician Or 02/13/20  Yes [provider]  vitamin B-12 (CYANOCOBALAMIN) 500 MCG tablet Take 1 tablet (500 mcg total) by mouth daily. 03/03/20  Yes Ngetich, Dinah C, NP  zinc gluconate 50 MG tablet Take 1 tablet (50 mg total) by mouth daily. 03/03/20  Yes Ngetich, Dinah C, NP  Disposable Gloves MISC Use gloves for diaper changes.Dispense 2 boxes 03/19/20   [provider]  Incontinence Supply Disposable (COMFORT SHIELD ADULT DIAPERS) MISC Dispense 5 boxes/mo. Use 4-6 times daily for fecal incontinence and diarrhea. 03/19/20   [provider]  Incontinence Supply Disposable (RA ADULT WIPES) MISC Please dispense 4 boxes/wipes per month. 03/19/20   [provider]  Incontinence Supply Disposable (UNDERPADS MEDIUM) MISC Dispense 3 boxes/mo for fecal and urinary incontinence and diarrhea. 03/19/20   [provider]  ketoconazole (NIZORAL) 2 % shampoo Apply 1 application topically See admin instructions. Use to wash hair and face, alternate by week. Patient not taking: Reported on 08/18/2020 03/03/20   Ngetich, Nelda Bucks, NP  Misc. Devices MISC Manual wheelchair. 16 inch wide with elevating leg rests, high reclining back and neck support and 16-inch cushion. 03/18/20   [provider]  NON FORMULARY Regular liberalized diet d/t Dx Malnutrition.  02/14/20   [provider]     Critical care time: 40 minutes discussing end of life and severe respiratory failure management     Roselie Awkward, MD House PCCM Pager: [phone] Cell: [phone] If no response, call [phone]

## 2020-07-25 NOTE — Progress Notes (Signed)
RT entered to Pt's room to do an ABG and after speaking to the DO it was decided that the Pt didn't need the ABG and the family agreed. The family is looking into hospice. RT will continue to monitor.

## 2020-07-26 ENCOUNTER — Other Ambulatory Visit: Payer: Self-pay

## 2020-07-26 DIAGNOSIS — R6520 Severe sepsis without septic shock: Secondary | ICD-10-CM

## 2020-07-26 DIAGNOSIS — G40909 Epilepsy, unspecified, not intractable, without status epilepticus: Secondary | ICD-10-CM

## 2020-07-26 DIAGNOSIS — J690 Pneumonitis due to inhalation of food and vomit: Secondary | ICD-10-CM

## 2020-07-26 DIAGNOSIS — A419 Sepsis, unspecified organism: Principal | ICD-10-CM

## 2020-07-26 DIAGNOSIS — J189 Pneumonia, unspecified organism: Secondary | ICD-10-CM

## 2020-07-26 DIAGNOSIS — J9601 Acute respiratory failure with hypoxia: Secondary | ICD-10-CM | POA: Diagnosis not present

## 2020-07-26 LAB — COMPREHENSIVE METABOLIC PANEL
ALT: 5 U/L (ref 0–44)
AST: 31 U/L (ref 15–41)
Albumin: 1.9 g/dL — ABNORMAL LOW (ref 3.5–5.0)
Alkaline Phosphatase: 36 U/L — ABNORMAL LOW (ref 38–126)
Anion gap: 9 (ref 5–15)
BUN: 22 mg/dL — ABNORMAL HIGH (ref 6–20)
CO2: 29 mmol/L (ref 22–32)
Calcium: 8 mg/dL — ABNORMAL LOW (ref 8.9–10.3)
Chloride: 113 mmol/L — ABNORMAL HIGH (ref 98–111)
Creatinine, Ser: 0.56 mg/dL — ABNORMAL LOW (ref 0.61–1.24)
GFR, Estimated: >60 mL/min (ref >60)
Glucose, Bld: 99 mg/dL (ref 70–99)
Potassium: 3.5 mmol/L (ref 3.5–5.1)
Sodium: 151 mmol/L — ABNORMAL HIGH (ref 135–145)
Total Bilirubin: 0.7 mg/dL (ref 0.3–1.2)
Total Protein: 5.1 g/dL — ABNORMAL LOW (ref 6.5–8.1)

## 2020-07-26 LAB — URINALYSIS, ROUTINE W REFLEX MICROSCOPIC
Bacteria, UA: NONE SEEN
Bilirubin Urine: NEGATIVE
Glucose, UA: NEGATIVE mg/dL
Ketones, ur: 5 mg/dL — AB
Nitrite: NEGATIVE
Protein, ur: 30 mg/dL — AB
Specific Gravity, Urine: 1.028 (ref 1.005–1.030)
WBC, UA: >50 WBC/hpf — ABNORMAL HIGH (ref 0–5)
pH: 5 (ref 5.0–8.0)

## 2020-07-26 LAB — CBC WITH DIFFERENTIAL/PLATELET
Abs Immature Granulocytes: 0.01 10*3/uL (ref 0.00–0.07)
Basophils Absolute: 0 10*3/uL (ref 0.0–0.1)
Basophils Relative: 2 %
Eosinophils Absolute: 0 10*3/uL (ref 0.0–0.5)
Eosinophils Relative: 0 %
HCT: 35.4 % — ABNORMAL LOW (ref 39.0–52.0)
Hemoglobin: 10 g/dL — ABNORMAL LOW (ref 13.0–17.0)
Immature Granulocytes: 1 %
Lymphocytes Relative: 22 %
Lymphs Abs: 0.3 10*3/uL — ABNORMAL LOW (ref 0.7–4.0)
MCH: 31.9 pg (ref 26.0–34.0)
MCHC: 28.2 g/dL — ABNORMAL LOW (ref 30.0–36.0)
MCV: 113.1 fL — ABNORMAL HIGH (ref 80.0–100.0)
Monocytes Absolute: 0.3 10*3/uL (ref 0.1–1.0)
Monocytes Relative: 22 %
Neutro Abs: 0.7 10*3/uL — ABNORMAL LOW (ref 1.7–7.7)
Neutrophils Relative %: 53 %
Platelets: 222 10*3/uL (ref 150–400)
RBC: 3.13 MIL/uL — ABNORMAL LOW (ref 4.22–5.81)
RDW: 19.1 % — ABNORMAL HIGH (ref 11.5–15.5)
WBC: 1.3 10*3/uL — CL (ref 4.0–10.5)
nRBC: 0 % (ref 0.0–0.2)

## 2020-07-26 LAB — TSH: TSH: 0.876 u[IU]/mL (ref 0.350–4.500)

## 2020-07-26 LAB — EXPECTORATED SPUTUM ASSESSMENT W GRAM STAIN, RFLX TO RESP C

## 2020-07-26 LAB — INFLUENZA PANEL BY PCR (TYPE A & B)
Influenza A By PCR: NEGATIVE
Influenza B By PCR: NEGATIVE

## 2020-07-26 LAB — MRSA PCR SCREENING: MRSA by PCR: NEGATIVE

## 2020-07-26 LAB — PHOSPHORUS: Phosphorus: 2.7 mg/dL (ref 2.5–4.6)

## 2020-07-26 LAB — MAGNESIUM: Magnesium: 1.9 mg/dL (ref 1.7–2.4)

## 2020-07-26 MED ORDER — LORAZEPAM 2 MG/ML IJ SOLN: 1.0000 mg | INTRAMUSCULAR | Status: DC | PRN

## 2020-07-26 MED ORDER — SODIUM CHLORIDE 0.9% FLUSH: 10.0000 mL | Freq: Two times a day (BID) | INTRAVENOUS | Status: DC

## 2020-07-26 MED ORDER — DICLOFENAC SODIUM 1 % EX GEL
2.0000 g | Freq: Two times a day (BID) | CUTANEOUS | Status: DC
  Administered 2020-07-26 (×2): 2 g via TOPICAL
  Filled 2020-07-26: qty 100

## 2020-07-26 MED ORDER — LEVETIRACETAM IN NACL 500 MG/100ML IV SOLN
500.0000 mg | Freq: Two times a day (BID) | INTRAVENOUS | Status: DC
  Administered 2020-07-26 (×2): 500 mg via INTRAVENOUS
  Filled 2020-07-26 (×3): qty 100

## 2020-07-26 MED ORDER — HALOPERIDOL LACTATE 5 MG/ML IJ SOLN: 0.5000 mg | INTRAMUSCULAR | Status: DC | PRN

## 2020-07-26 MED ORDER — METRONIDAZOLE IN NACL 5-0.79 MG/ML-% IV SOLN
500.0000 mg | Freq: Once | INTRAVENOUS | Status: AC
  Administered 2020-07-26: 500 mg via INTRAVENOUS
  Filled 2020-07-26: qty 100

## 2020-07-26 MED ORDER — METOPROLOL TARTRATE 12.5 MG HALF TABLET
12.5000 mg | ORAL_TABLET | Freq: Two times a day (BID) | ORAL | Status: DC
  Filled 2020-07-26: qty 1

## 2020-07-26 MED ORDER — MORPHINE SULFATE (PF) 4 MG/ML IV SOLN
4.0000 mg | Freq: Once | INTRAVENOUS | Status: AC
  Administered 2020-07-26: 4 mg via INTRAVENOUS

## 2020-07-26 MED ORDER — HALOPERIDOL 0.5 MG PO TABS
0.5000 mg | ORAL_TABLET | ORAL | Status: DC | PRN
  Filled 2020-07-26: qty 1

## 2020-07-26 MED ORDER — FINASTERIDE 5 MG PO TABS
5.0000 mg | ORAL_TABLET | Freq: Every day | ORAL | Status: DC
  Filled 2020-07-26: qty 1

## 2020-07-26 MED ORDER — LEVETIRACETAM 500 MG PO TABS
500.0000 mg | ORAL_TABLET | Freq: Two times a day (BID) | ORAL | Status: DC
  Filled 2020-07-26: qty 1

## 2020-07-26 MED ORDER — HALOPERIDOL LACTATE 2 MG/ML PO CONC: 0.5000 mg | ORAL | Status: DC | PRN

## 2020-07-26 MED ORDER — VITAMIN B-12 1000 MCG PO TABS
500.0000 ug | ORAL_TABLET | Freq: Every day | ORAL | Status: DC
  Filled 2020-07-26: qty 1

## 2020-07-26 MED ORDER — CHLORHEXIDINE GLUCONATE CLOTH 2 % EX PADS
6.0000 | MEDICATED_PAD | Freq: Every day | CUTANEOUS | Status: DC
  Administered 2020-07-26: 6 via TOPICAL

## 2020-07-26 MED ORDER — LACTATED RINGERS IV SOLN: INTRAVENOUS | Status: AC

## 2020-07-26 MED ORDER — TEMAZEPAM 7.5 MG PO CAPS: 7.5000 mg | ORAL_CAPSULE | Freq: Every evening | ORAL | Status: DC | PRN

## 2020-07-26 MED ORDER — MORPHINE 100MG IN NS 100ML (1MG/ML) PREMIX INFUSION
1.0000 mg/h | INTRAVENOUS | Status: DC
  Administered 2020-07-26: 1 mg/h via INTRAVENOUS
  Filled 2020-07-26 (×2): qty 100

## 2020-07-26 MED ORDER — SODIUM CHLORIDE 0.9% FLUSH: 10.0000 mL | INTRAVENOUS | Status: DC | PRN

## 2020-07-26 MED ORDER — LEVETIRACETAM IN NACL 500 MG/100ML IV SOLN
500.0000 mg | Freq: Once | INTRAVENOUS | Status: AC
  Administered 2020-07-26: 500 mg via INTRAVENOUS
  Filled 2020-07-26: qty 100

## 2020-07-26 MED ORDER — ENOXAPARIN SODIUM 30 MG/0.3ML ~~LOC~~ SOLN
30.0000 mg | SUBCUTANEOUS | Status: DC
  Administered 2020-07-26: 30 mg via SUBCUTANEOUS
  Filled 2020-07-26: qty 0.3

## 2020-07-26 MED ORDER — MORPHINE SULFATE (PF) 4 MG/ML IV SOLN: 4.0000 mg | INTRAVENOUS | Status: DC | PRN

## 2020-07-26 MED ORDER — ASPIRIN EC 81 MG PO TBEC
81.0000 mg | DELAYED_RELEASE_TABLET | Freq: Every day | ORAL | Status: DC
  Filled 2020-07-26: qty 1

## 2020-07-26 MED ORDER — POLYSACCHARIDE IRON COMPLEX 150 MG PO CAPS
150.0000 mg | ORAL_CAPSULE | Freq: Every day | ORAL | Status: DC
  Filled 2020-07-26: qty 1

## 2020-07-26 MED ORDER — ADULT MULTIVITAMIN W/MINERALS CH
1.0000 | ORAL_TABLET | Freq: Every day | ORAL | Status: DC
  Filled 2020-07-26: qty 1

## 2020-07-26 NOTE — Progress Notes (Signed)
[facility] PROGRESS NOTE    Progress Note  Hendricks Schwandt  ZOX:[id number] DOB: Sep 20, 1965 DOA: 07/23/2020 PCP: Eartha Inch, MD     Brief Narrative:   Tracy Todd is an 54 y.o. male past medical history of seizures, recurrent syncope, iron deficiency anemia Down syndrome seizure disorder who presents to the ED with shortness of breath fever and progressive lethargy at home  Assessment/Plan:   Acute respiratory failure with hypoxia new suspect due to aspiration pneumonia versus unlikely COVID-19: Regardless he has been declining over the last several months, I spoke with family at bedside and they would like to move to aggressive comfort care. Healthcare power of attorney Misty Stanley is at bedside and she wants to pursue hospice care. He tested positive for SARS-CoV-2 on 07/06/2020 he will come off isolation on 2020-08-21. We will go ahead and stop all medications are now related to comfort and start him on a morphine drip he is complaining of pain and gasping for air during my examination.  Stop all lab checks Patient will probably pass away in house.  Severe sepsis: Discontinue all antibiotics and labs as requested by family.  Dysphagia: May have comfort feeds  Hypernatremia: Encourage free water per mouth.  History of Down syndrome  Seizure disorder (HCC) Change Keppra to IV.  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 RN Pressure Injury Documentation: Pressure Injury 06/23/20 Hip Left Stage 4 - Full thickness tissue loss with exposed bone, tendon or muscle. 3 x 3 cm opened wound with undermining at 3 cm  (Active)  06/23/20 0025  Location: Hip  Location Orientation: Left  Staging: Stage 4 - Full thickness tissue loss with exposed bone, tendon or muscle.  Wound Description (Comments): 3 x 3  cm opened wound with undermining at 3 cm   Present on Admission: Yes     Pressure Injury 06/23/20 Lower;Mid Stage 3 -  Full thickness tissue loss. Subcutaneous fat may be visible but bone, tendon or muscle are NOT exposed. pressure ulcer with undermining (Active)  06/23/20 0025  Location:   Location Orientation: Lower;Mid  Staging: Stage 3 -  Full thickness tissue loss. Subcutaneous fat may be visible but bone, tendon or muscle are NOT exposed.  Wound Description (Comments): pressure ulcer with undermining  Present on Admission: Yes    Estimated body mass index is 16.2 kg/m as calculated from the following:   Height as of this encounter: 5\' 1"  (1.549 m).   Weight as of this encounter: 38.9 kg.    DVT prophylaxis: None Family Communication:Lisa HCPOA Status is: Inpatient  Remains inpatient appropriate because:Hemodynamically unstable   Dispo: The patient is from: SNF              Anticipated d/c is to: SNF              Anticipated d/c date is: 2 days              Patient currently is not medically stable to d/c.  Patient will probably pass away in house.    Code Status:     Code Status Orders  (From admission, onward)         Start     Ordered   07/26/20 0204  Do not attempt resuscitation (DNR)  Continuous       Question Answer Comment  In the  event of cardiac or respiratory ARREST Do not call a "code blue"   In the event of cardiac or respiratory ARREST Do not perform Intubation, CPR, defibrillation or ACLS   In the event of cardiac or respiratory ARREST Use medication by any route, position, wound care, and other measures to relive pain and suffering. May use oxygen, suction and manual treatment of airway obstruction as needed for comfort.   Comments DNR / DNI      07/26/20 0203        Code Status History    Date Active Date Inactive Code Status Order ID Comments User Context   07/24/2020 1619 07/26/2020 0203 DNR [id number]  Lupita LeashMcQuaid, Douglas B, MD ED   07/21/2020  1047 08/18/2020 0946 DNR [id number]  Kermit Baloeed, Tiffany L, DO Outpatient   07/01/2020 1036 07/20/2020 2130 DNR [id number]  Jeanella Crazellis, Brandi L, NP Inpatient   06/23/2020 0019 07/01/2020 1035 Full Code [id number]  Zierle-Ghosh, Asia B, DO Inpatient   02/05/2020 1723 02/14/2020 0054 Full Code [id number]  Pennie BanterGriffith, Kelly A, DO ED   02/05/2020 1721 02/05/2020 1723 DNR [id number]  Pennie BanterGriffith, Kelly A, DO ED   12/03/2019 1555 12/07/2019 0337 Full Code [id number]  Jerald Kiefhiu, Stephen K, MD Inpatient   11/29/2019 1511 12/03/2019 1554 DNR [id number]  Jonah BlueYates, Jennifer, MD ED   09/16/2019 1637 09/17/2019 1800 Full Code [id number]  Burna CashSantos-Sanchez, Idalys, MD ED   Advance Care Planning Activity    Advance Directive Documentation     Most Recent Value  Type of Advance Directive Out of facility DNR (pink MOST or yellow form)  Pre-existing out of facility DNR order (yellow form or pink MOST form) Pink Most/Yellow Form available - Physician notified to receive inpatient order  "MOST" Form in Place? --        IV Access:    Peripheral IV   Procedures and diagnostic studies:   DG Chest Port 1 View  Result Date: 08/14/2020 CLINICAL DATA:  Admitted for multifocal pneumonia/sepsis. Assess for possible mucous plugging. EXAM: PORTABLE CHEST 1 VIEW COMPARISON:  07/23/2020 at 10:41 a.m. FINDINGS: Bilateral perihilar and lower lung zone opacities are without change from the earlier exam. There may be volume loss on the right, this assessment somewhat limited by patient rotation to the right. No convincing pleural effusion and no pneumothorax. Left PICC is stable. IMPRESSION: 1. No change from the earlier study. 2. Perihilar and lower lung zone opacities consistent with multifocal pneumonia. Possible volume loss on the right, which suggests a component of atelectasis. Electronically Signed   By: Amie Portlandavid  Ormond M.D.   On: 07/31/2020 14:04   DG Chest Port 1 View  Result Date: 08/06/2020 CLINICAL DATA:  [facility]. Pt  has been on IV ABX x 5 days for PNA. Staff called out for SOB and fever. EXAM: PORTABLE CHEST 1 VIEW COMPARISON:  07/06/2020 FINDINGS: Hazy perihilar and lower lung zone airspace opacities are noted, findings on the left similar to the previous chest radiograph, findings on the right are new. No evidence of pulmonary edema. No convincing pleural effusion and no pneumothorax. Cardiac silhouette is normal in size. No mediastinal or hilar masses. Left PICC is stable, tip in the lower superior vena cava. IMPRESSION: 1. Bilateral hazy perihilar and lower lung airspace opacities consistent with multifocal pneumonia. Electronically Signed   By: Amie Portlandavid  Ormond M.D.   On: 08/08/2020 10:51     Medical Consultants:    None.  Anti-Infectives:   none  Subjective:  Renso Swett   Objective:    Vitals:   07/26/20 0307 07/26/20 0413 07/26/20 0503 07/26/20 0519  BP: 104/70 97/72  96/72  Pulse: 96 95  89  Resp: (!) 28 (!) 28  (!) 29  Temp: 98.8 F (37.1 C) 98.5 F (36.9 C)  98.4 F (36.9 C)  TempSrc: Oral Oral    SpO2: 98% 95%  98%  Weight:    38.9 kg  Height:   5\' 1"  (1.549 m)    SpO2: 98 % O2 Flow Rate (L/min): 6 L/min   Intake/Output Summary (Last 24 hours) at 07/26/2020 0948 Last data filed at 07/26/2020 0417 Gross per 24 hour  Intake 3914.24 ml  Output --  Net 3914.24 ml   Filed Weights   07/26/20 0519  Weight: 38.9 kg    Exam: General exam: In no acute distress. Respiratory system: Good air movement and clear to auscultation. Cardiovascular system: S1 & S2 heard, RRR. No JVD, murmurs, rubs, gallops or clicks.  Gastrointestinal system: Abdomen is nondistended, soft and nontender.  Central nervous system: Alert and oriented. No focal neurological deficits. Extremities: No pedal edema. Skin: No rashes, lesions or ulcers Psychiatry: Judgement and insight appear normal. Mood & affect appropriate.    Data Reviewed:    Labs: Basic Metabolic Panel: Recent Labs  Lab  08/19/20 1000 Aug 19, 2020 1000 2020/08/19 1400 07/26/20 0450  NA 149*  --  148* 151*  K 3.6   < > 3.5 3.5  CL 112*  --  112* 113*  CO2 28  --  30 29  GLUCOSE 128*  --  107* 99  BUN 47*  --  35* 22*  CREATININE 0.79  --  0.67 0.56*  CALCIUM 8.2*  --  8.1* 8.0*  MG  --   --   --  1.9  PHOS  --   --   --  2.7   < > = values in this interval not displayed.   GFR Estimated Creatinine Clearance: 57.4 mL/min (A) (by C-G formula based on SCr of 0.56 mg/dL (L)). Liver Function Tests: Recent Labs  Lab 08/19/20 1000 07/26/20 0450  AST 32 31  ALT 5 5  ALKPHOS 40 36*  BILITOT 0.2* 0.7  PROT 5.7* 5.1*  ALBUMIN 2.3* 1.9*   No results for input(s): LIPASE, AMYLASE in the last 168 hours. No results for input(s): AMMONIA in the last 168 hours. Coagulation profile Recent Labs  Lab 08-19-20 1000  INR 1.7*   COVID-19 Labs  No results for input(s): DDIMER, FERRITIN, LDH, CRP in the last 72 hours.  Lab Results  Component Value Date   SARSCOV2NAA POSITIVE (A) 07/06/2020   SARSCOV2NAA POSITIVE (A) 07/06/2020   SARSCOV2NAA NEGATIVE 06/22/2020   SARSCOV2NAA NEGATIVE 02/12/2020    CBC: Recent Labs  Lab 08-19-2020 1000 19-Aug-2020 1400 07/26/20 0450  WBC 0.8* 0.9* 1.3*  NEUTROABS 0.5* 0.5* 0.7*  HGB 11.1* 10.9* 10.0*  HCT 37.7* 37.0* 35.4*  MCV 110.9* 110.4* 113.1*  PLT 327 288 222   Cardiac Enzymes: No results for input(s): CKTOTAL, CKMB, CKMBINDEX, TROPONINI in the last 168 hours. BNP (last 3 results) No results for input(s): PROBNP in the last 8760 hours. CBG: No results for input(s): GLUCAP in the last 168 hours. D-Dimer: No results for input(s): DDIMER in the last 72 hours. Hgb A1c: No results for input(s): HGBA1C in the last 72 hours. Lipid Profile: No results for input(s): CHOL, HDL, LDLCALC, TRIG, CHOLHDL, LDLDIRECT in the last 72 hours. Thyroid function studies: Recent  Labs    07/26/20 0450  TSH 0.876   Anemia work up: No results for input(s): VITAMINB12, FOLATE,  FERRITIN, TIBC, IRON, RETICCTPCT in the last 72 hours. Sepsis Labs: Recent Labs  Lab 08-05-2020 1000 Aug 05, 2020 1400 08-05-20 1406 07/26/20 0450  WBC 0.8* 0.9*  --  1.3*  LATICACIDVEN 2.9*  --  2.7*  --    Microbiology Recent Results (from the past 240 hour(s))  Blood Culture (routine x 2)     Status: None (Preliminary result)   Collection Time: 2020/08/05 10:00 AM   Specimen: BLOOD  Result Value Ref Range Status   Specimen Description   Final    BLOOD RIGHT ANTECUBITAL Performed at [facility], [address], [place], [place] [address]    Special Requests   Final    BOTTLES DRAWN AEROBIC AND ANAEROBIC Blood Culture adequate volume Performed at [facility], [address] [place], [place] [address]    Culture   Final    NO GROWTH < 24 HOURS Performed at [facility], [address], [place], [place] [address]    Report Status PENDING  Incomplete  Blood Culture (routine x 2)     Status: None (Preliminary result)   Collection Time: 08-05-20 10:05 AM   Specimen: BLOOD LEFT ARM  Result Value Ref Range Status   Specimen Description   Final    BLOOD LEFT ARM Performed at [facility], [address] [place], [place] [address]    Special Requests   Final    BOTTLES DRAWN AEROBIC AND ANAEROBIC Blood Culture adequate volume Performed at [facility], [address], [place], [place] [address]    Culture   Final    NO GROWTH < 24 HOURS Performed at [facility], [address], [place], [place] [address]    Report Status PENDING  Incomplete  MRSA PCR Screening     Status: None   Collection Time: 05-Aug-2020  1:26 PM   Specimen: Nasopharyngeal  Result Value Ref Range Status   MRSA by PCR NEGATIVE NEGATIVE Final    Comment:        The GeneXpert MRSA Assay (FDA approved for NASAL specimens only), is one component of a comprehensive MRSA colonization surveillance program. It is not intended to diagnose  MRSA infection nor to guide or monitor treatment for MRSA infections. Performed at [facility], [address] [place], [place] [address]      Medications:   . aspirin EC  81 mg Oral Daily  . Chlorhexidine Gluconate Cloth  6 each Topical Daily  . diclofenac Sodium  2 g Topical BID  . enoxaparin (LOVENOX) injection  30 mg Subcutaneous Q24H  . finasteride  5 mg Oral Daily  . iron polysaccharides  150 mg Oral Daily  . levETIRAcetam  500 mg Oral BID  . metoprolol tartrate  12.5 mg Oral BID  . metroNIDAZOLE  500 mg Oral Q8H  . multivitamin with minerals  1 tablet Oral Daily  . sodium chloride flush  10-40 mL Intracatheter Q12H  . vitamin B-12  500 mcg Oral Daily   Continuous Infusions: . ceFEPime (MAXIPIME) IV 2 g (07/26/20 0417)  . vancomycin        LOS: 1 day   Marinda Elk  [facility]  07/26/2020, 9:48 AM

## 2020-07-26 NOTE — Progress Notes (Addendum)
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  Need TOC to see if he can return to facility.  Hospice can be initiated at [facility].  Since he is having respiratory symptoms and covid is potentially the source, he would not be able to go to one of our residential hospice facilities.  Unfortunately, [facility] does not currently offer hospice in place.   [facility] will reach out to the family to see how we can support them.  Wallis Bamberg RN, BSN, CCRN [facility] Liaison   [phone], spoke with sister Misty Stanley, she states she is not sure if he should d/c back to [facility] SN[facility] or not.  She cannot get an answer from the [facility] about if he can return today or not. She states she would like for him to if they are in agreement with him returning. Advised her we can set hospice up tomorrow once the determination has been made by the facility if he can return or not.

## 2020-07-26 NOTE — Progress Notes (Signed)
   07/26/20 0159  Assess: MEWS Score  Temp (!) 100.4 F (38 C)  BP 94/63  Pulse Rate (!) 109  Resp (!) 32  Level of Consciousness Alert  SpO2 97 %  O2 Device HFNC  Patient Activity (if Appropriate) In bed  O2 Flow Rate (L/min) 6 L/min  Assess: MEWS Score  MEWS Temp 0  MEWS Systolic 1  MEWS Pulse 1  MEWS RR 2  MEWS LOC 0  MEWS Score 4  MEWS Score Color Red  Assess: if the MEWS score is Yellow or Red  Were vital signs taken at a resting state? Yes  Focused Assessment No change from prior assessment  Early Detection of Sepsis Score *See Row Information* High  MEWS guidelines implemented *See Row Information* Yes  Treat  MEWS Interventions Escalated (See documentation below)  Pain Scale Faces  Faces Pain Scale 2  Pain Type Acute pain  Pain Location Hip  Pain Orientation Anterior  Pain Intervention(s) Repositioned;Rest  Take Vital Signs  Increase Vital Sign Frequency  Red: Q 1hr X 4 then Q 4hr X 4, if remains red, continue Q 4hrs  Escalate  MEWS: Escalate Red: discuss with charge nurse/RN and provider, consider discussing with RRT  Notify: Charge Nurse/RN  Name of Charge Nurse/RN Notified Debbie RN  Date Charge Nurse/RN Notified 07/26/20  Time Charge Nurse/RN Notified 0214  Notify: Provider  Provider Name/Title Linton Flemings  Date Provider Notified 07/26/20  Time Provider Notified 0214  Notification Type Page  Notification Reason Other (Comment) (Red MEWS)  Response See new orders  Date of Provider Response 07/26/20  Time of Provider Response 0215  Document  Patient Outcome Not stable and remains on department  Progress note created (see row info) Yes

## 2020-07-27 DIAGNOSIS — Q909 Down syndrome, unspecified: Secondary | ICD-10-CM | POA: Diagnosis not present

## 2020-07-27 DIAGNOSIS — J9601 Acute respiratory failure with hypoxia: Secondary | ICD-10-CM | POA: Diagnosis not present

## 2020-07-27 DIAGNOSIS — A419 Sepsis, unspecified organism: Secondary | ICD-10-CM | POA: Diagnosis not present

## 2020-07-27 DIAGNOSIS — J189 Pneumonia, unspecified organism: Secondary | ICD-10-CM | POA: Diagnosis not present

## 2020-07-27 LAB — URINE CULTURE: Culture: 20000 — AB

## 2020-07-27 LAB — PATHOLOGIST SMEAR REVIEW

## 2020-07-29 LAB — CULTURE, RESPIRATORY W GRAM STAIN

## 2020-07-30 LAB — CULTURE, BLOOD (ROUTINE X 2)
Culture: NO GROWTH
Culture: NO GROWTH
Special Requests: ADEQUATE
Special Requests: ADEQUATE

## 2020-08-06 ENCOUNTER — Ambulatory Visit: Payer: Medicare Other | Admitting: Infectious Diseases

## 2020-08-20 IMAGING — CT CT MAXILLOFACIAL W/O CM
3 of 6 series · 15 of 47 positions shown, 18 images · non-contrast
Comparison: Head and cervical spine CT 09/16/2019

CLINICAL DATA: Unwitnessed fall on bathroom floor. Neck trauma.
History of down syndrome.

EXAM:
CT HEAD WITHOUT CONTRAST
CT MAXILLOFACIAL WITHOUT CONTRAST
CT CERVICAL SPINE WITHOUT CONTRAST
TECHNIQUE: Multidetector CT imaging of the head, cervical spine, and
maxillofacial structures were performed using the standard protocol
without intravenous contrast. Multiplanar CT image reconstructions
of the cervical spine and maxillofacial structures were also
generated.

[Series 3: maxilllofacial 2.0 hr40 3 · axial · 0.40mm/px · z∈[-286,-178]mm · 9 of 64 slices shown, 12 images]
[im 5/64  brain]
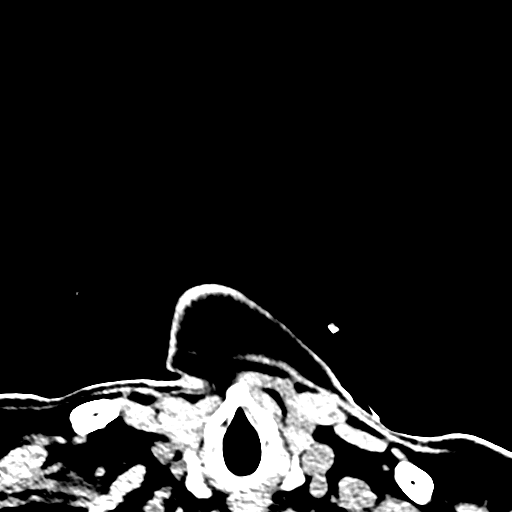
[im 5/64  bone]
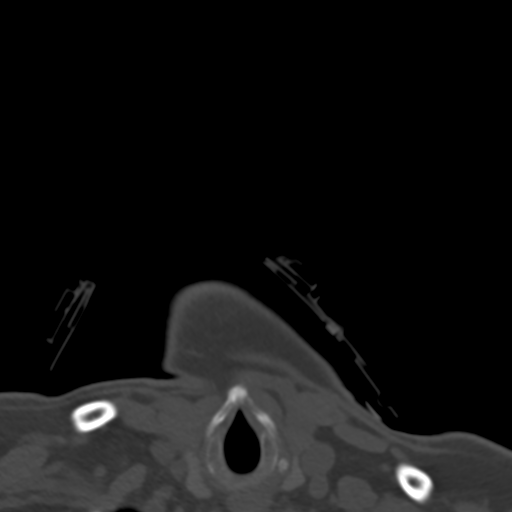
[im 14/64  bone]
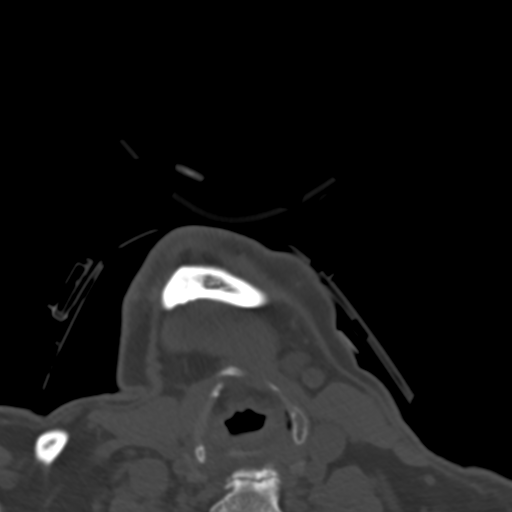
[im 19/64  bone]
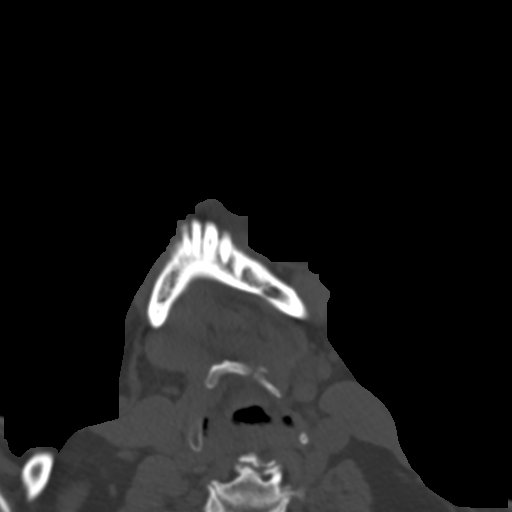
[im 28/64  bone]
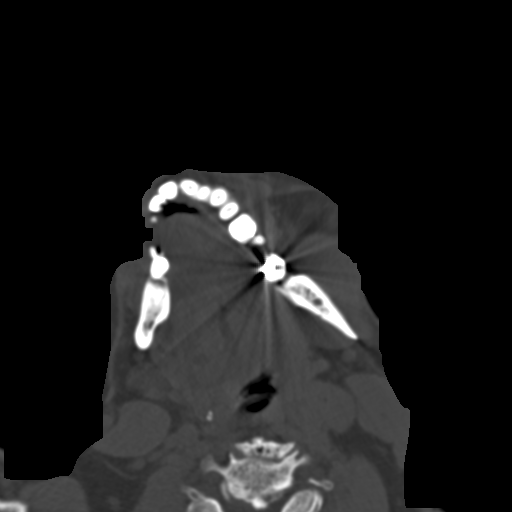
[im 32/64  brain]
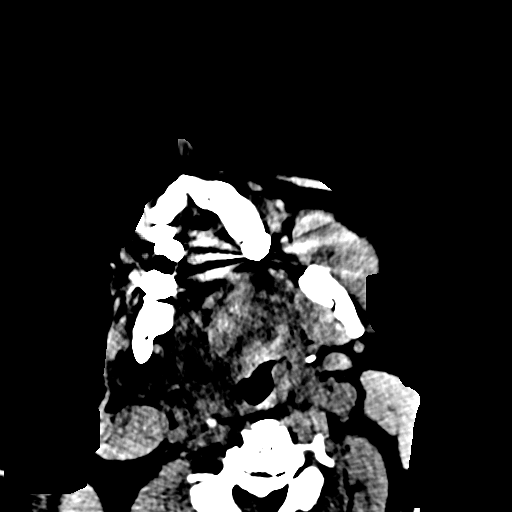
[im 32/64  bone]
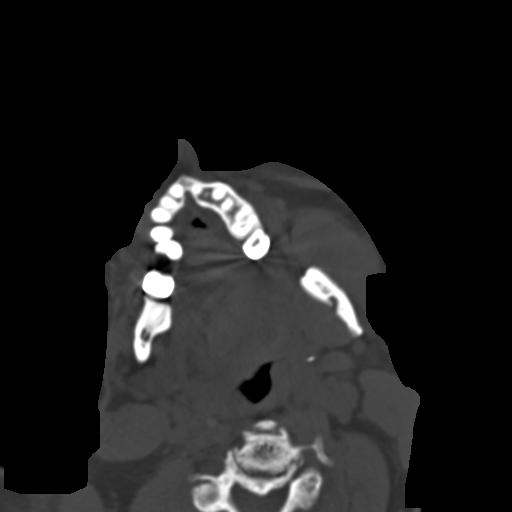
[im 37/64  bone]
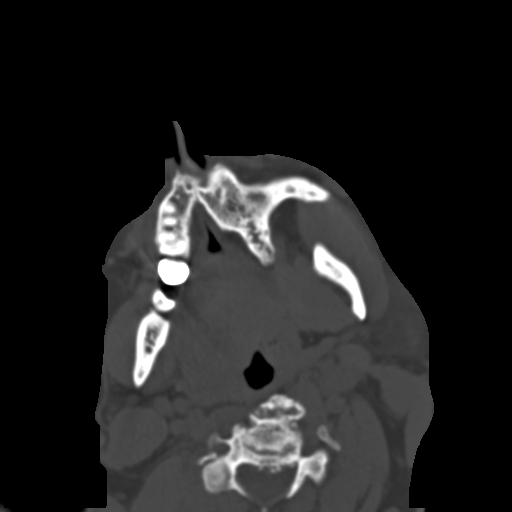
[im 46/64  bone]
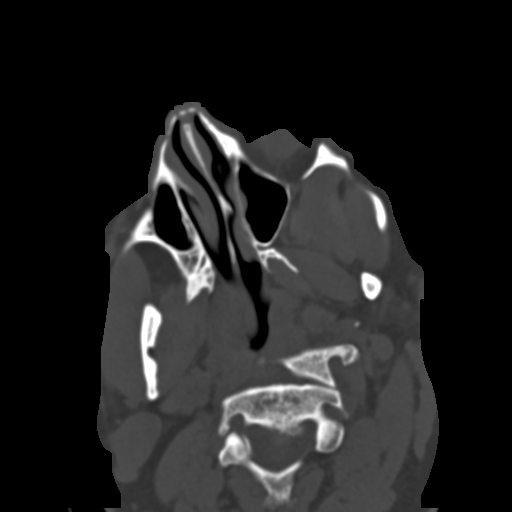
[im 50/64  bone]
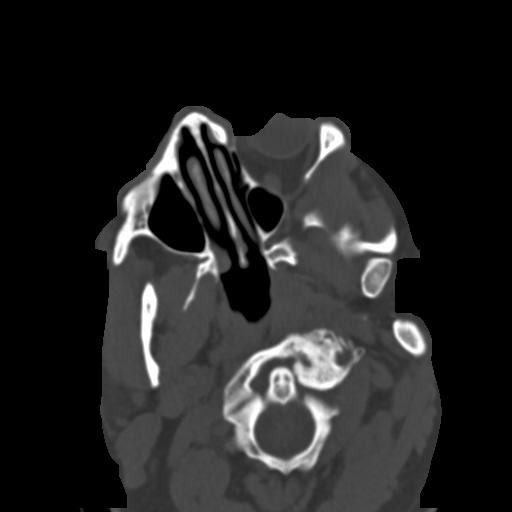
[im 59/64  brain]
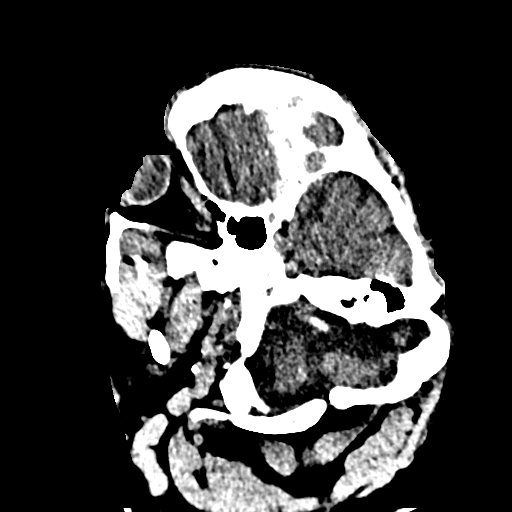
[im 59/64  bone]
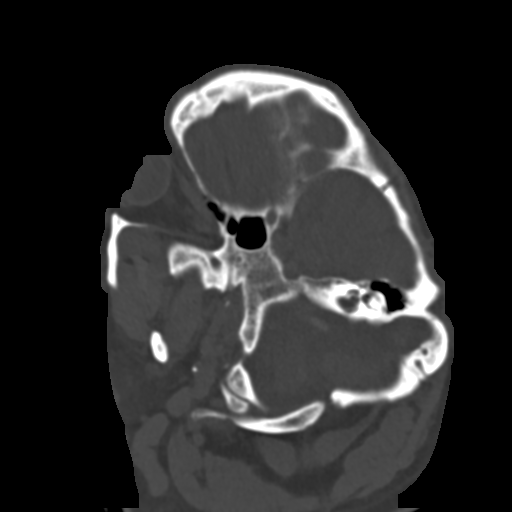

[Series 7: st cor · coronal · 0.32mm/px · 3 of 93 slices shown]
[im 24/93  bone]
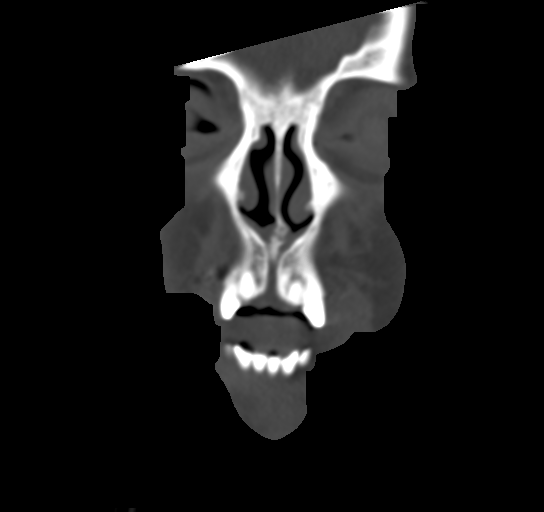
[im 47/93  bone]
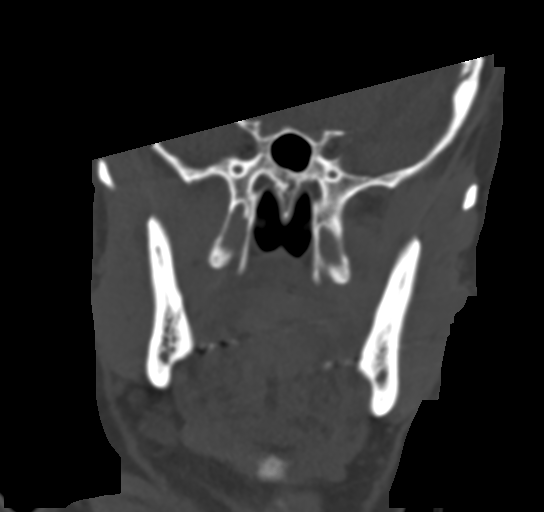
[im 70/93  bone]
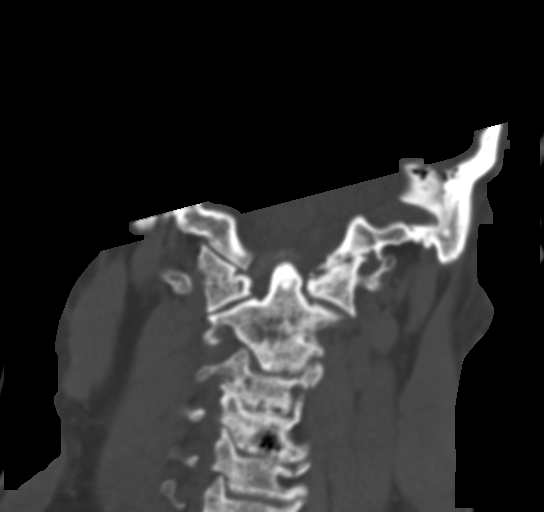

[Series 10: bone sag · sagittal · 0.34mm/px · 3 of 100 slices shown]
[im 33/100  bone]
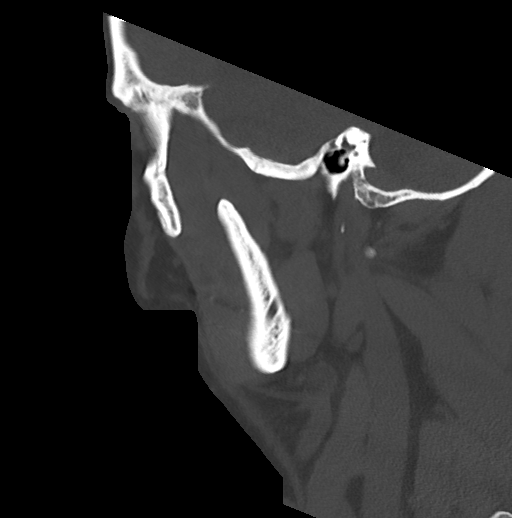
[im 44/100  bone]
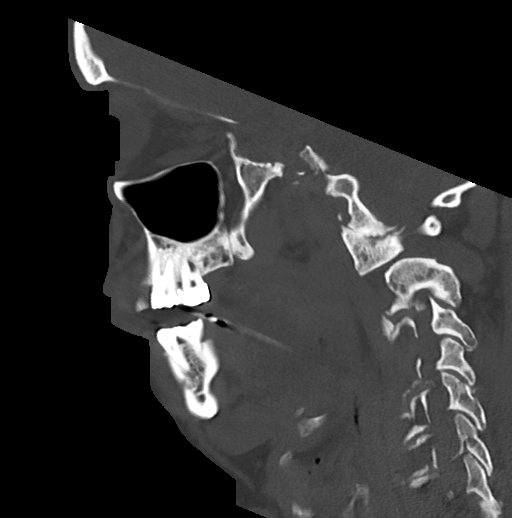
[im 56/100  bone]
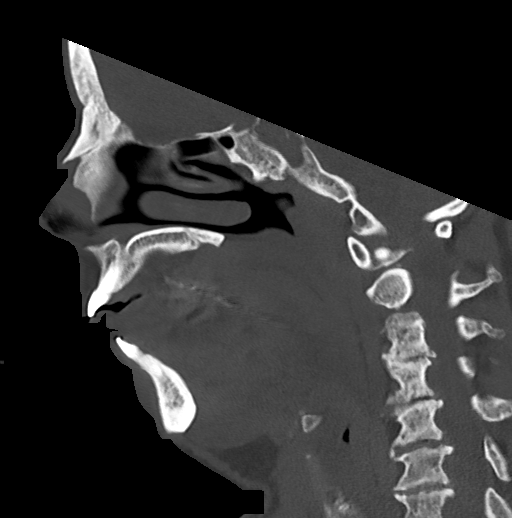

[15 of 47 positions shown; findings below may reference images not displayed]

FINDINGS: CT HEAD FINDINGS

Brain: Mild stable age related atrophic change. Ventricles and
cisterns are otherwise unchanged. No mass, mass effect, shift of
midline structures or acute hemorrhage. No evidence of acute
infarction.

Vascular: No hyperdense vessel or unexpected calcification.

Skull: Normal. Negative for fracture or focal lesion.

Other: None.

CT MAXILLOFACIAL FINDINGS

Osseous: No fracture or mandibular dislocation. No destructive
process.

Orbits: Negative. No traumatic or inflammatory finding.

Sinuses: Hypoplastic frontal sinuses. Paranasal sinuses are
otherwise clear.

Soft tissues: No acute soft tissue injury. Mild asymmetric
prominence of the right tonsillar soft tissues.

CT CERVICAL SPINE FINDINGS

Alignment: Mild reversal of the normal cervical lordosis. No
posttraumatic subluxation.

Skull base and vertebrae: Vertebral body heights are normal. There
is moderate spondylosis throughout the cervical spine to include
facet arthropathy and uncovertebral joint spurring. Significant
degenerative changes over the articulation between the occiput of
the skull and left lateral mass of C1 unchanged. Minimal prominence
of the atlanto dens interval unchanged compatible with known down
syndrome. No acute fracture or subluxation. Moderate bilateral
neural foraminal narrowing at multiple levels due to adjacent bony
spurring.

Soft tissues and spinal canal: Mild canal stenosis at several levels
of the cervical spine due to prominent posterior osteophyte
formation.

Disc levels: Disc space narrowing throughout the cervical spine
unchanged.

Upper chest: No acute findings.

Other: None.
IMPRESSION: 1.  No acute brain injury.  Mild stable atrophic change.

2.  No acute facial bone fracture.

3.  No acute cervical spine injury.

4. Moderate spondylosis throughout the cervical spine with
multilevel disc disease and multilevel bilateral neural foraminal
narrowing unchanged. Mild spinal canal stenosis at several levels
predominately due to posterior osteophyte formation.

5. Mild asymmetric soft tissue prominence over the right tonsillar
soft tissues. Recommend clinical correlation.

## 2020-08-22 NOTE — Progress Notes (Signed)
NT and nurse at patients bedside waiting for family to arrive. Will continue to monitor the patient.

## 2020-08-22 NOTE — Death Summary Note (Signed)
Death Summary  Tracy Todd AQT:[id number] DOB: June 11, 1966 DOA: 2020/08/24  PCP: Eartha Inch, MD  Admit date: 08/24/2020 Date of Death: Aug 26, 2020 Time of Death: 06:45 Notification: Eartha Inch, MD notified of death of 08-26-2020   History of present illness:   55 y.o. male past medical history of seizures, recurrent syncope, iron deficiency anemia Down syndrome seizure disorder who presents to the ED with shortness of breath fever and progressive lethargy at home  Final Diagnoses:  Acute respiratory failure with hypoxia new suspect due to aspiration pneumonia versus unlikely COVID-19: Patient was started on aggressive treatment on admission with empiric antibiotics and fluid resuscitated. After having a long discussion with the family daily quested to move towards comfort care all medications were and labs were discontinued he was started on IV morphine for pain control.  Every time he was being moved he was complaining of pain, and related that he was not hungry and cannot catch his breath. After he was placed on IV morphine he was more comfortable in his breathing and calm down and his family was at bedside and was comfortable with decision he passed away in house.  Severe sepsis Dysphagia Hypernatremia History of Down syndrome History of seizure disorder    The results of significant diagnostics from this hospitalization (including imaging, microbiology, ancillary and laboratory) are listed below for reference.    Significant Diagnostic Studies: DG Chest Port 1 View  Result Date: August 24, 2020 CLINICAL DATA:  Admitted for multifocal pneumonia/sepsis. Assess for possible mucous plugging. EXAM: PORTABLE CHEST 1 VIEW COMPARISON:  2020/08/24 at 10:41 a.m. FINDINGS: Bilateral perihilar and lower lung zone opacities are without change from the earlier exam. There may be volume loss on the right, this assessment somewhat limited by patient rotation to the right. No convincing  pleural effusion and no pneumothorax. Left PICC is stable. IMPRESSION: 1. No change from the earlier study. 2. Perihilar and lower lung zone opacities consistent with multifocal pneumonia. Possible volume loss on the right, which suggests a component of atelectasis. Electronically Signed   By: Amie Portland M.D.   On: 08-24-2020 14:04   DG Chest Port 1 View  Result Date: August 24, 2020 CLINICAL DATA:  Pt BIB EMS from [facility]. Pt has been on IV ABX x 5 days for PNA. Staff called out for SOB and fever. EXAM: PORTABLE CHEST 1 VIEW COMPARISON:  07/06/2020 FINDINGS: Hazy perihilar and lower lung zone airspace opacities are noted, findings on the left similar to the previous chest radiograph, findings on the right are new. No evidence of pulmonary edema. No convincing pleural effusion and no pneumothorax. Cardiac silhouette is normal in size. No mediastinal or hilar masses. Left PICC is stable, tip in the lower superior vena cava. IMPRESSION: 1. Bilateral hazy perihilar and lower lung airspace opacities consistent with multifocal pneumonia. Electronically Signed   By: Amie Portland M.D.   On: 08-24-20 10:51   DG CHEST PORT 1 VIEW  Result Date: 07/06/2020 CLINICAL DATA:  55 year old male with down syndrome, positive COVID-19. EXAM: PORTABLE CHEST 1 VIEW COMPARISON:  Portable chest 07/02/2020 and earlier. FINDINGS: Portable AP semi upright view at 1437 hours. Left upper extremity PICC line remains in place, tip at the lower SVC level. Increasing patchy and indistinct opacity in the left mid and lower lung. Allowing for portable technique the right lung remains clear. Mediastinal contours remain normal. No pneumothorax or pleural effusion. No acute osseous abnormality identified. Visible bowel gas pattern is within normal limits. IMPRESSION: Increasing  indistinct opacity in the left lung compatible with COVID-19 pneumonia. Right lung relatively spared. Electronically Signed   By: Odessa Fleming M.D.   On:  07/06/2020 14:51   DG CHEST PORT 1 VIEW  Result Date: 07/02/2020 CLINICAL DATA:  Aspiration pneumonia EXAM: PORTABLE CHEST 1 VIEW COMPARISON:  Two days ago FINDINGS: Indistinct opacity at the lung bases. No visible effusion or pneumothorax. Normal heart size. Left PICC with tip at the upper right atrium. IMPRESSION: Stable infiltrates at the lung bases. Electronically Signed   By: Marnee Spring M.D.   On: 07/02/2020 05:27   DG CHEST PORT 1 VIEW  Result Date: 06/30/2020 CLINICAL DATA:  Seizures, syncope, osteomyelitis EXAM: PORTABLE CHEST 1 VIEW COMPARISON:  06/22/2020 FINDINGS: Single frontal view of the chest demonstrates an unremarkable cardiac silhouette. Left-sided PICC tip projects over the atrial caval junction. Persistent patchy consolidation within the medial right lung base. No effusion or pneumothorax. No acute bony abnormalities. IMPRESSION: 1. Persistent right lower lobe consolidation, not appreciably changed since prior CT. Favor mucous plugging/atelectasis over aspiration or infection. Electronically Signed   By: Sharlet Salina M.D.   On: 06/30/2020 17:00   [place] EKG SITE RITE  Result Date: 07/01/2020 If Site Rite image not attached, placement could not be confirmed due to current cardiac rhythm.   Microbiology: Recent Results (from the past 240 hour(s))  Blood Culture (routine x 2)     Status: None (Preliminary result)   Collection Time: 08-20-2020 10:00 AM   Specimen: BLOOD  Result Value Ref Range Status   Specimen Description   Final    BLOOD RIGHT ANTECUBITAL Performed at [facility], [address] [place], [place] [address]    Special Requests   Final    BOTTLES DRAWN AEROBIC AND ANAEROBIC Blood Culture adequate volume Performed at [facility], [address] [place], [place] [address]    Culture   Final    NO GROWTH 2 DAYS Performed at [facility], [address], [place], [place] [address]    Report Status PENDING  Incomplete   Urine culture     Status: None (Preliminary result)   Collection Time: 08-20-20 10:00 AM   Specimen: Urine, Catheterized  Result Value Ref Range Status   Specimen Description   Final    URINE, CATHETERIZED Performed at [facility], [address] [place], [place] [address]    Special Requests   Final    NONE Performed at [facility], [address], [place], [place] [address]    Culture   Final    CULTURE REINCUBATED FOR BETTER GROWTH Performed at [facility], [address], [place], [place] [address]    Report Status PENDING  Incomplete  Blood Culture (routine x 2)     Status: None (Preliminary result)   Collection Time: 08/20/20 10:05 AM   Specimen: BLOOD LEFT ARM  Result Value Ref Range Status   Specimen Description   Final    BLOOD LEFT ARM Performed at [facility], [address], [place], [place] [address]    Special Requests   Final    BOTTLES DRAWN AEROBIC AND ANAEROBIC Blood Culture adequate volume Performed at [facility], [address], [place], [place] [address]    Culture   Final    NO GROWTH 2 DAYS Performed at [facility], [address], [place], [place] [address]    Report Status PENDING  Incomplete  MRSA PCR Screening     Status: None   Collection Time: 07/29/2020  1:26 PM   Specimen: Nasopharyngeal  Result Value Ref Range Status   MRSA by PCR NEGATIVE NEGATIVE Final    Comment:        The GeneXpert MRSA Assay (FDA approved for NASAL specimens only), is one component of a comprehensive MRSA colonization surveillance program. It is not intended to diagnose MRSA infection nor to guide or monitor treatment for MRSA infections. Performed at [facility], [address] [place], [place] [address]   Culture, sputum-assessment     Status: None   Collection Time: 07/26/20  2:04 AM   Specimen: Sputum  Result Value Ref Range Status   Specimen  Description SPU  Final   Special Requests EXPECTORATED  Final   Sputum evaluation   Final    THIS SPECIMEN IS ACCEPTABLE FOR SPUTUM CULTURE Performed at [facility], [address] [place], [place] [address]    Report Status 07/26/2020 FINAL  Final  Culture, respiratory     Status: None (Preliminary result)   Collection Time: 07/26/20  2:04 AM   Specimen: Sputum  Result Value Ref Range Status   Specimen Description   Final    SPU Performed at [facility], [address] [place], [place] [address]    Special Requests   Final    EXPECTORATED Reflexed from [id number] Performed at [facility], [address] [place], [place] [address]    Gram Stain   Final    RARE WBC PRESENT,BOTH PMN AND MONONUCLEAR MODERATE GRAM VARIABLE ROD MODERATE BUDDING YEAST SEEN Performed at [facility], [address] [place], [place] [address]    Culture PENDING  Incomplete   Report Status PENDING  Incomplete     Labs: Basic Metabolic Panel: Recent Labs  Lab 08/20/2020 1000 08/10/2020 1000 07/29/2020 1400 07/26/20 0450  NA 149*  --  148* 151*  K 3.6   < > 3.5 3.5  CL 112*  --  112* 113*  CO2 28  --  30 29  GLUCOSE 128*  --  107* 99  BUN 47*  --  35* 22*  CREATININE 0.79  --  0.67 0.56*  CALCIUM 8.2*  --  8.1* 8.0*  MG  --   --   --  1.9  PHOS  --   --   --  2.7   < > = values in this interval not displayed.   Liver Function Tests: Recent Labs  Lab 08/01/2020 1000 07/26/20 0450  AST 32 31  ALT 5 5  ALKPHOS 40 36*  BILITOT 0.2* 0.7  PROT 5.7* 5.1*  ALBUMIN 2.3* 1.9*   No results for input(s): LIPASE, AMYLASE in the last 168 hours. No results for input(s): AMMONIA in the last 168 hours. CBC: Recent Labs  Lab 07/29/2020 1000 08/19/2020 1400 07/26/20 0450  WBC 0.8* 0.9* 1.3*  NEUTROABS 0.5* 0.5* 0.7*  HGB 11.1* 10.9* 10.0*  HCT 37.7* 37.0* 35.4*  MCV 110.9* 110.4* 113.1*  PLT 327 288 222   Cardiac Enzymes: No results  for input(s): CKTOTAL, CKMB, CKMBINDEX, TROPONINI in the last 168 hours. D-Dimer No results for input(s): DDIMER in the last 72 hours. BNP: Invalid input(s): POCBNP CBG: No results for input(s): GLUCAP in the last 168 hours. Anemia work up No results for input(s): VITAMINB12, FOLATE, FERRITIN, TIBC, IRON, RETICCTPCT in the last 72 hours. Urinalysis  Component Value Date/Time   COLORURINE YELLOW Aug 02, 2020 1000   APPEARANCEUR HAZY (A) 08/02/2020 1000   LABSPEC 1.028 02-Aug-2020 1000   PHURINE 5.0 August 02, 2020 1000   GLUCOSEU NEGATIVE 08-02-2020 1000   HGBUR SMALL (A) 2020/08/02 1000   BILIRUBINUR NEGATIVE 08/02/20 1000   KETONESUR 5 (A) 08/02/2020 1000   PROTEINUR 30 (A) Aug 02, 2020 1000   NITRITE NEGATIVE Aug 02, 2020 1000   LEUKOCYTESUR LARGE (A) 08/02/20 1000   Sepsis Labs Invalid input(s): PROCALCITONIN,  WBC,  LACTICIDVEN     SIGNED:  Marinda Elk, MD  [facility] 08/08/2020, 8:49 AM Pager   If 7PM-7AM, please contact night-coverage [email or web address] Password [email or web address]

## 2020-08-22 NOTE — Progress Notes (Signed)
Patient breathing is irregular, labored, and using accessory muscles. Patient is on continuous IV morphine for comfort measures. Will continue to monitor the patient.

## 2020-08-22 NOTE — Progress Notes (Signed)
Patient expired at 77. Family notified and with patient at the bedside.

## 2020-08-22 NOTE — Progress Notes (Signed)
New Morphine drip replacement bag physically returned to pharmacy by this nurse to Pharmacy tech [place] .  Levora Angel, RN

## 2020-08-22 NOTE — Progress Notes (Addendum)
Patient repositioned by this nurse and NT. Patient's breathing is labored with episodes of apnea noted. Mottled skin noted to hands bilaterally and bilateral feet. Feet were cold to touch. Patients sister notified of the changes. Will continue to monitor the patient.

## 2020-08-22 DEATH — deceased

## 2020-11-18 ENCOUNTER — Ambulatory Visit: Payer: Medicare Other | Admitting: Neurology
